# Patient Record
Sex: Female | Born: 1940 | Race: White | Hispanic: No | Smoking: Former smoker
Health system: Southern US, Community
[De-identification: ages and names within clinical notes are randomized; demographics above are authoritative.]

## PROBLEM LIST (undated history)

## (undated) DIAGNOSIS — I10 Essential (primary) hypertension: Secondary | ICD-10-CM

## (undated) DIAGNOSIS — C4490 Unspecified malignant neoplasm of skin, unspecified: Secondary | ICD-10-CM

## (undated) DIAGNOSIS — M1990 Unspecified osteoarthritis, unspecified site: Secondary | ICD-10-CM

## (undated) DIAGNOSIS — F32A Depression, unspecified: Secondary | ICD-10-CM

## (undated) DIAGNOSIS — F419 Anxiety disorder, unspecified: Secondary | ICD-10-CM

## (undated) DIAGNOSIS — Z923 Personal history of irradiation: Secondary | ICD-10-CM

## (undated) DIAGNOSIS — M4800 Spinal stenosis, site unspecified: Secondary | ICD-10-CM

## (undated) DIAGNOSIS — K219 Gastro-esophageal reflux disease without esophagitis: Secondary | ICD-10-CM

## (undated) DIAGNOSIS — M419 Scoliosis, unspecified: Secondary | ICD-10-CM

## (undated) DIAGNOSIS — E7800 Pure hypercholesterolemia, unspecified: Secondary | ICD-10-CM

## (undated) DIAGNOSIS — M810 Age-related osteoporosis without current pathological fracture: Secondary | ICD-10-CM

## (undated) DIAGNOSIS — D649 Anemia, unspecified: Secondary | ICD-10-CM

## (undated) DIAGNOSIS — N1831 Chronic kidney disease, stage 3a: Secondary | ICD-10-CM

## (undated) DIAGNOSIS — C801 Malignant (primary) neoplasm, unspecified: Secondary | ICD-10-CM

## (undated) DIAGNOSIS — K635 Polyp of colon: Secondary | ICD-10-CM

## (undated) DIAGNOSIS — E041 Nontoxic single thyroid nodule: Secondary | ICD-10-CM

## (undated) DIAGNOSIS — M19011 Primary osteoarthritis, right shoulder: Secondary | ICD-10-CM

## (undated) DIAGNOSIS — G8929 Other chronic pain: Secondary | ICD-10-CM

## (undated) DIAGNOSIS — E279 Disorder of adrenal gland, unspecified: Secondary | ICD-10-CM

## (undated) DIAGNOSIS — F329 Major depressive disorder, single episode, unspecified: Secondary | ICD-10-CM

## (undated) DIAGNOSIS — C50919 Malignant neoplasm of unspecified site of unspecified female breast: Secondary | ICD-10-CM

## (undated) DIAGNOSIS — H3530 Unspecified macular degeneration: Secondary | ICD-10-CM

## (undated) DIAGNOSIS — M1712 Unilateral primary osteoarthritis, left knee: Secondary | ICD-10-CM

## (undated) HISTORY — PX: ESOPHAGOGASTRODUODENOSCOPY: SHX1529

## (undated) HISTORY — PX: NOSE SURGERY: SHX723

## (undated) HISTORY — PX: APPENDECTOMY: SHX54

## (undated) HISTORY — PX: COLONOSCOPY W/ POLYPECTOMY: SHX1380

## (undated) HISTORY — PX: ABDOMINAL HYSTERECTOMY: SHX81

---

## 1997-08-28 ENCOUNTER — Ambulatory Visit (HOSPITAL_COMMUNITY): Admission: RE | Admit: 1997-08-28 | Discharge: 1997-08-28 | Payer: Self-pay | Admitting: Obstetrics & Gynecology

## 1997-12-08 ENCOUNTER — Ambulatory Visit (HOSPITAL_COMMUNITY): Admission: RE | Admit: 1997-12-08 | Discharge: 1997-12-08 | Payer: Self-pay

## 1999-10-18 ENCOUNTER — Ambulatory Visit (HOSPITAL_COMMUNITY): Admission: RE | Admit: 1999-10-18 | Discharge: 1999-10-18 | Payer: Self-pay | Admitting: Obstetrics & Gynecology

## 1999-10-18 ENCOUNTER — Encounter: Payer: Self-pay | Admitting: Obstetrics & Gynecology

## 2000-11-07 ENCOUNTER — Ambulatory Visit (HOSPITAL_COMMUNITY): Admission: RE | Admit: 2000-11-07 | Discharge: 2000-11-07 | Payer: Self-pay | Admitting: Family Medicine

## 2001-12-11 ENCOUNTER — Ambulatory Visit (HOSPITAL_COMMUNITY): Admission: RE | Admit: 2001-12-11 | Discharge: 2001-12-11 | Payer: Self-pay | Admitting: Internal Medicine

## 2001-12-11 ENCOUNTER — Encounter: Payer: Self-pay | Admitting: Family Medicine

## 2002-12-16 ENCOUNTER — Ambulatory Visit (HOSPITAL_COMMUNITY): Admission: RE | Admit: 2002-12-16 | Discharge: 2002-12-16 | Payer: Self-pay | Admitting: *Deleted

## 2004-01-14 ENCOUNTER — Ambulatory Visit: Payer: Self-pay | Admitting: Family Medicine

## 2004-07-14 ENCOUNTER — Ambulatory Visit: Payer: Self-pay | Admitting: Podiatry

## 2005-01-16 ENCOUNTER — Ambulatory Visit: Payer: Self-pay | Admitting: Family Medicine

## 2005-02-14 ENCOUNTER — Ambulatory Visit: Payer: Self-pay | Admitting: Family Medicine

## 2006-02-27 ENCOUNTER — Ambulatory Visit: Payer: Self-pay | Admitting: Family Medicine

## 2006-03-27 HISTORY — PX: REPLACEMENT TOTAL KNEE: SUR1224

## 2006-03-27 HISTORY — PX: JOINT REPLACEMENT: SHX530

## 2006-03-29 ENCOUNTER — Ambulatory Visit: Payer: Self-pay | Admitting: Family Medicine

## 2006-08-07 ENCOUNTER — Ambulatory Visit: Payer: Self-pay | Admitting: Gastroenterology

## 2007-03-04 ENCOUNTER — Ambulatory Visit: Payer: Self-pay | Admitting: Family Medicine

## 2007-03-11 ENCOUNTER — Ambulatory Visit: Payer: Self-pay | Admitting: Unknown Physician Specialty

## 2007-03-11 ENCOUNTER — Other Ambulatory Visit: Payer: Self-pay

## 2007-03-25 ENCOUNTER — Inpatient Hospital Stay: Payer: Self-pay | Admitting: Unknown Physician Specialty

## 2008-03-05 ENCOUNTER — Ambulatory Visit: Payer: Self-pay | Admitting: Family Medicine

## 2009-03-08 ENCOUNTER — Ambulatory Visit: Payer: Self-pay | Admitting: Family Medicine

## 2009-12-09 ENCOUNTER — Inpatient Hospital Stay: Payer: Self-pay | Admitting: Gastroenterology

## 2010-02-21 ENCOUNTER — Ambulatory Visit: Payer: Self-pay | Admitting: Unknown Physician Specialty

## 2010-04-26 ENCOUNTER — Ambulatory Visit: Payer: Self-pay | Admitting: Family Medicine

## 2011-04-28 ENCOUNTER — Ambulatory Visit: Payer: Self-pay | Admitting: Family Medicine

## 2011-06-17 ENCOUNTER — Ambulatory Visit: Payer: Self-pay | Admitting: Internal Medicine

## 2011-10-05 DIAGNOSIS — K219 Gastro-esophageal reflux disease without esophagitis: Secondary | ICD-10-CM | POA: Insufficient documentation

## 2011-10-05 DIAGNOSIS — I10 Essential (primary) hypertension: Secondary | ICD-10-CM | POA: Insufficient documentation

## 2011-10-05 DIAGNOSIS — F419 Anxiety disorder, unspecified: Secondary | ICD-10-CM | POA: Insufficient documentation

## 2011-10-05 DIAGNOSIS — T18108A Unspecified foreign body in esophagus causing other injury, initial encounter: Secondary | ICD-10-CM | POA: Insufficient documentation

## 2011-10-05 DIAGNOSIS — E7800 Pure hypercholesterolemia, unspecified: Secondary | ICD-10-CM | POA: Insufficient documentation

## 2011-10-05 DIAGNOSIS — K635 Polyp of colon: Secondary | ICD-10-CM | POA: Insufficient documentation

## 2011-11-28 ENCOUNTER — Ambulatory Visit: Payer: Self-pay | Admitting: Family Medicine

## 2012-05-01 ENCOUNTER — Ambulatory Visit: Payer: Self-pay | Admitting: Family Medicine

## 2013-02-24 ENCOUNTER — Ambulatory Visit: Payer: Self-pay | Admitting: Unknown Physician Specialty

## 2013-04-21 DIAGNOSIS — Z79891 Long term (current) use of opiate analgesic: Secondary | ICD-10-CM | POA: Insufficient documentation

## 2013-04-21 DIAGNOSIS — M1990 Unspecified osteoarthritis, unspecified site: Secondary | ICD-10-CM | POA: Insufficient documentation

## 2013-05-08 ENCOUNTER — Ambulatory Visit: Payer: Self-pay | Admitting: Family Medicine

## 2013-08-12 ENCOUNTER — Ambulatory Visit: Payer: Self-pay | Admitting: Emergency Medicine

## 2013-08-12 LAB — CBC WITH DIFFERENTIAL/PLATELET
Basophil #: 0.1 10*3/uL (ref 0.0–0.1)
Basophil %: 0.7 %
Eosinophil #: 0.1 10*3/uL (ref 0.0–0.7)
Eosinophil %: 0.6 %
HCT: 38.1 % (ref 35.0–47.0)
HGB: 12.8 g/dL (ref 12.0–16.0)
Lymphocyte #: 2.6 10*3/uL (ref 1.0–3.6)
Lymphocyte %: 28.3 %
MCH: 29.8 pg (ref 26.0–34.0)
MCHC: 33.6 g/dL (ref 32.0–36.0)
MCV: 89 fL (ref 80–100)
Monocyte #: 0.8 x10 3/mm (ref 0.2–0.9)
Monocyte %: 8.5 %
Neutrophil #: 5.8 10*3/uL (ref 1.4–6.5)
Neutrophil %: 61.9 %
Platelet: 263 10*3/uL (ref 150–440)
RBC: 4.3 10*6/uL (ref 3.80–5.20)
RDW: 14.1 % (ref 11.5–14.5)
WBC: 9.3 10*3/uL (ref 3.6–11.0)

## 2013-08-12 LAB — URINALYSIS, COMPLETE
Bilirubin,UR: NEGATIVE
Blood: NEGATIVE
Glucose,UR: NEGATIVE mg/dL (ref 0–75)
Ketone: NEGATIVE
Nitrite: NEGATIVE
Ph: 7 (ref 4.5–8.0)
Protein: NEGATIVE
Specific Gravity: 1.01 (ref 1.003–1.030)
WBC UR: >30 /HPF (ref 0–5)

## 2013-08-12 LAB — BASIC METABOLIC PANEL
Anion Gap: 11 (ref 7–16)
BUN: 25 mg/dL — ABNORMAL HIGH (ref 7–18)
Calcium, Total: 9.9 mg/dL (ref 8.5–10.1)
Chloride: 93 mmol/L — ABNORMAL LOW (ref 98–107)
Co2: 31 mmol/L (ref 21–32)
Creatinine: 1.27 mg/dL (ref 0.60–1.30)
EGFR (African American): 48 — ABNORMAL LOW
EGFR (Non-African Amer.): 42 — ABNORMAL LOW
Glucose: 112 mg/dL — ABNORMAL HIGH (ref 65–99)
Osmolality: 275 (ref 275–301)
Potassium: 3.4 mmol/L — ABNORMAL LOW (ref 3.5–5.1)
Sodium: 135 mmol/L — ABNORMAL LOW (ref 136–145)

## 2013-08-14 LAB — URINE CULTURE

## 2013-08-22 ENCOUNTER — Inpatient Hospital Stay: Payer: Self-pay | Admitting: Internal Medicine

## 2013-08-22 LAB — URINALYSIS, COMPLETE
Bacteria: NONE SEEN
Bilirubin,UR: NEGATIVE
Blood: NEGATIVE
Glucose,UR: NEGATIVE mg/dL (ref 0–75)
Ketone: NEGATIVE
Leukocyte Esterase: NEGATIVE
Nitrite: NEGATIVE
Ph: 8 (ref 4.5–8.0)
Protein: NEGATIVE
RBC,UR: NONE SEEN /HPF (ref 0–5)
Specific Gravity: 1.003 (ref 1.003–1.030)
Squamous Epithelial: NONE SEEN
WBC UR: <1 /HPF (ref 0–5)

## 2013-08-22 LAB — COMPREHENSIVE METABOLIC PANEL
Albumin: 4.1 g/dL (ref 3.4–5.0)
Alkaline Phosphatase: 67 U/L
Anion Gap: 6 — ABNORMAL LOW (ref 7–16)
BUN: 9 mg/dL (ref 7–18)
Bilirubin,Total: 0.4 mg/dL (ref 0.2–1.0)
Calcium, Total: 9.3 mg/dL (ref 8.5–10.1)
Chloride: 88 mmol/L — ABNORMAL LOW (ref 98–107)
Co2: 28 mmol/L (ref 21–32)
Creatinine: 0.96 mg/dL (ref 0.60–1.30)
EGFR (African American): >60
EGFR (Non-African Amer.): 58 — ABNORMAL LOW
Glucose: 140 mg/dL — ABNORMAL HIGH (ref 65–99)
Osmolality: 247 (ref 275–301)
Potassium: 3.5 mmol/L (ref 3.5–5.1)
SGOT(AST): 34 U/L (ref 15–37)
SGPT (ALT): 26 U/L (ref 12–78)
Sodium: 122 mmol/L — ABNORMAL LOW (ref 136–145)
Total Protein: 7.4 g/dL (ref 6.4–8.2)

## 2013-08-22 LAB — CBC WITH DIFFERENTIAL/PLATELET
Basophil #: 0.1 10*3/uL (ref 0.0–0.1)
Basophil %: 0.9 %
Eosinophil #: 0 10*3/uL (ref 0.0–0.7)
Eosinophil %: 0.7 %
HCT: 35 % (ref 35.0–47.0)
HGB: 12.1 g/dL (ref 12.0–16.0)
Lymphocyte #: 1.9 10*3/uL (ref 1.0–3.6)
Lymphocyte %: 27.1 %
MCH: 30.2 pg (ref 26.0–34.0)
MCHC: 34.7 g/dL (ref 32.0–36.0)
MCV: 87 fL (ref 80–100)
Monocyte #: 0.6 x10 3/mm (ref 0.2–0.9)
Monocyte %: 8.8 %
Neutrophil #: 4.4 10*3/uL (ref 1.4–6.5)
Neutrophil %: 62.5 %
Platelet: 220 10*3/uL (ref 150–440)
RBC: 4.02 10*6/uL (ref 3.80–5.20)
RDW: 14 % (ref 11.5–14.5)
WBC: 7.1 10*3/uL (ref 3.6–11.0)

## 2013-08-22 LAB — URIC ACID: Uric Acid: 4.2 mg/dL (ref 2.6–6.0)

## 2013-08-22 LAB — SODIUM, URINE, RANDOM: Sodium, Urine Random: 38 mmol/L (ref 20–110)

## 2013-08-22 LAB — SODIUM: SODIUM: 128 mmol/L — AB (ref 136–145)

## 2013-08-22 LAB — OSMOLALITY, URINE: Osmolality: 146 mOsm/kg

## 2013-08-23 LAB — BASIC METABOLIC PANEL
ANION GAP: 4 — AB (ref 7–16)
BUN: 17 mg/dL (ref 7–18)
CALCIUM: 8.6 mg/dL (ref 8.5–10.1)
CHLORIDE: 99 mmol/L (ref 98–107)
CO2: 30 mmol/L (ref 21–32)
CREATININE: 0.95 mg/dL (ref 0.60–1.30)
EGFR (African American): >60
GFR CALC NON AF AMER: 59 — AB
GLUCOSE: 90 mg/dL (ref 65–99)
Osmolality: 267 (ref 275–301)
POTASSIUM: 3.8 mmol/L (ref 3.5–5.1)
Sodium: 133 mmol/L — ABNORMAL LOW (ref 136–145)

## 2013-08-23 LAB — CBC WITH DIFFERENTIAL/PLATELET
BASOS PCT: 0.9 %
Basophil #: 0.1 10*3/uL (ref 0.0–0.1)
EOS ABS: 0.1 10*3/uL (ref 0.0–0.7)
EOS PCT: 2.1 %
HCT: 32.6 % — AB (ref 35.0–47.0)
HGB: 11 g/dL — ABNORMAL LOW (ref 12.0–16.0)
LYMPHS ABS: 2.8 10*3/uL (ref 1.0–3.6)
Lymphocyte %: 39.2 %
MCH: 30 pg (ref 26.0–34.0)
MCHC: 33.9 g/dL (ref 32.0–36.0)
MCV: 89 fL (ref 80–100)
MONO ABS: 0.9 x10 3/mm (ref 0.2–0.9)
Monocyte %: 12 %
NEUTROS ABS: 3.3 10*3/uL (ref 1.4–6.5)
Neutrophil %: 45.8 %
Platelet: 212 10*3/uL (ref 150–440)
RBC: 3.68 10*6/uL — ABNORMAL LOW (ref 3.80–5.20)
RDW: 14.3 % (ref 11.5–14.5)
WBC: 7.3 10*3/uL (ref 3.6–11.0)

## 2013-08-23 LAB — TSH: THYROID STIMULATING HORM: 1.54 u[IU]/mL

## 2013-08-24 LAB — URINE CULTURE

## 2014-03-26 DIAGNOSIS — M1710 Unilateral primary osteoarthritis, unspecified knee: Secondary | ICD-10-CM | POA: Insufficient documentation

## 2014-03-26 DIAGNOSIS — M179 Osteoarthritis of knee, unspecified: Secondary | ICD-10-CM | POA: Insufficient documentation

## 2014-05-19 ENCOUNTER — Ambulatory Visit: Payer: Self-pay | Admitting: Family Medicine

## 2014-05-26 DIAGNOSIS — S76319A Strain of muscle, fascia and tendon of the posterior muscle group at thigh level, unspecified thigh, initial encounter: Secondary | ICD-10-CM | POA: Insufficient documentation

## 2014-05-27 DIAGNOSIS — M6530 Trigger finger, unspecified finger: Secondary | ICD-10-CM | POA: Insufficient documentation

## 2014-07-18 NOTE — H&P (Signed)
PATIENT NAME:  Allison Hall, Allison Hall MR#:  [id number] DATE OF BIRTH:  11/16/1940  DATE OF ADMISSION:  08/22/2013  REFERRING PHYSICIAN: Dr. Karma Greaser  PRIMARY CARE PHYSICIAN:   CHIEF COMPLAINT: Abnormal labs.   HISTORY OF PRESENT ILLNESS: This is a very nice 74 year old female with history of recently diagnosed urinary tract infection on Aug 12, 2013, treated with Bactrim. The patient comes today with a history of going to her doctor and her doctor being alarmed due to low sodium levels. The patient has not had any significant changes on her condition other than the recent urinary tract infection on May 19th. At that moment, the patient was given trimethoprim-sulfamethoxazole. Her temperature was 99.4 and she was feeling pretty much tired. She looked dehydrated for what she was told to drink more water. The patient has doubled or even tripled her water intake and occasionally she drinks also Pedialyte, but she is definitely drinking much more water than usual. She denies any shortness of breath, any chest pain, any dysuria whatsoever. The patient is admitted for evaluation of her hyponatremia.  REVIEW OF SYSTEMS:  A 12 system review is done.  CONSTITUTIONAL: No fever. Positive chronic fatigue for 2 to 3 weeks. No weakness. Positive weight loss, but it has been intentionally over a year. She has lost at least 20 pounds. No weight gain.  EYES: No blurry vision, double vision. She is status post eyelid surgery.  EARS, NOSE, THROAT: No difficulty swallowing or tinnitus.  RESPIRATORY: No shortness of breath, cough or wheezing.  CARDIOVASCULAR: No chest pain or orthopnea.  GASTROINTESTINAL: No nausea, vomiting, abdominal pain, constipation, diarrhea.  GENITOURINARY: No dysuria, hematuria. Status post urinary tract infection, recently treated.  ENDOCRINE: No polyuria, polydipsia, polyphagia, cold or heat intolerance.  HEMATOLOGIC AND LYMPHATIC: No anemia, easy bruising or bleeding.  SKIN: No rashes or  petechiae.  MUSCULOSKELETAL: No significant neck pain, back pain or gout.  NEUROLOGIC: No numbness, tingling or CVA.  PSYCHIATRIC: No insomnia or depression.   PAST MEDICAL HISTORY: 1.  GERD.  2.  Depression.  3.  Hypertension.  4.  Osteoarthritis with chronic knee pain status post steroid injection.  5.  Hyperlipidemia.  PAST SURGICAL HISTORY: 1.  Bilateral eyelid surgery.  2.  Appendectomy.  3.  Right knee replacement in 2008.  4.  Left foot spur removed.  5.  Rhinoplasty.  6.  Hysterectomy.   ALLERGIES: No known drug allergies.   SOCIAL HISTORY: The patient used to smoke. She quit 40 years ago. She lives at home with her husband who has significant care issues due to spinal surgery that went wrong, as per the patient, and now she is the primary caregiver. She is retired. Does not smoke at this moment, does not drink.   FAMILY HISTORY: Positive for MI in her dad.  CURRENT MEDICATIONS: Meloxicam 7.5 mg once a day, tramadol 50 mg every 4 hours as needed for pain, lisinopril 10 mg once daily, alprazolam 0.25 mg as needed for depression, amlodipine 2.5 mg once a day, trimethoprim-sulfamethoxazole already completed, omeprazole 40 mg daily, fish oil 1200 mg daily, amlodipine 2.5 mg daily.   PHYSICAL EXAMINATION: VITAL SIGNS: Blood pressure 142/72, pulse 85, respirations 18, temperature 97.6.  GENERAL: The patient is alert and oriented x3, in no acute distress. No respiratory distress. Hemodynamically stable.  HEENT: Pupils are equal and reactive. Extraocular movements are intact. Mucosa is moist. Anicteric sclerae. Pink conjunctivae. No oral lesions. No oropharyngeal exudates.  NECK: Supple. No JVD. No thyromegaly. No adenopathy. No  carotid bruits.  CARDIOVASCULAR: Regular rate and rhythm. No murmurs, rubs, or gallops. No displacement of PMI. No tenderness to palpation on anterior chest wall.  LUNGS: Clear without any wheezing or crepitus. No use of accessory muscles.  ABDOMEN: Soft,  nontender, nondistended. No hepatosplenomegaly. No masses. Bowel sounds are positive.  EXTREMITIES: No edema, cyanosis or clubbing. Pulses +2. Capillary refill less than 3.  NEUROLOGIC: Cranial nerves II through XII intact. No focal findings. Strength is equal in all 4 extremities. PSYCHIATRIC: No agitation. The patient is alert and oriented x3.  SKIN: No rashes or petechiae. Normal turgor. Her eyes have some swelling at the level of the eyelids, which is secondary to surgery and has not been abnormal recently.   DIAGNOSTIC DATA: Her urine has 30 white blood cells, leukocyte esterase +2, trace bacteria; that was on May 19th. At that moment, she had mixed bacteria and her culture was not positive. White count 7.1, hemoglobin 12, platelet count 220,000. LFTs within normal limits. Her sodium is 122, potassium 3.5, chloride 88, glucose 140, GFR is around 60, and creatinine 0.96.  ASSESSMENT AND PLAN: This is a 74 year old female with history of hypertension, gastroesophageal reflux disease, and depression admitted for hyponatremia.  1.  Hyponatremia. The patient is mildly symptomatic with fatigue mostly, but no other symptoms. She is not orthostatic at this moment. It does not look like she is volume depleted. She has been drinking actually a lot of free water intake for the most. The patient is admitted for treatment of hyponatremia. We are going to give her IV fluids, just NS at this moment, but I think that overall she is going to need some fluid restriction off regular water. The patient is going to have urine sodium, urine osmolality, and a uric acid to evaluate the possibility of syndrome of inappropriate antidiuretic hormone. On top of that, since she is a previous smoker, we are going to do a chest x-ray, again working on the possibility of syndrome of inappropriate antidiuretic hormone as this could be related to lung cancer. The patient is doing okay. She is stable. She is going to have sodium levels  monitored tonight and then tomorrow. Overall, the patient is doing fine.  2.  Hypertension. Continue amlodipine and lisinopril.  3.  Depression. Seems to be stable at this moment.  4.  Deep vein thrombosis prophylaxis. The patient is ambulatory. We are going to do compression mechanical devices.  5.  Gastrointestinal prophylaxis with omeprazole.   TIME SPENT: About 40 minutes. ____________________________ Ranger Sink, MD rsg:sb D: 08/22/2013 15:31:14 ET T: 08/22/2013 16:15:21 ET JOB#: [id number]  cc: Shageluk Sink, MD, <Dictator> Lashe Oliveira America Brown MD ELECTRONICALLY SIGNED 08/30/2013 13:48

## 2014-07-18 NOTE — Discharge Summary (Signed)
PATIENT NAME:  Allison Hall, Allison Hall MR#:  [id number] DATE OF BIRTH:  12/19/1940  DATE OF ADMISSION:  08/22/2013 DATE OF DISCHARGE:  08/23/2013  ADMISSION DIAGNOSIS: Hyponatremia.   DISCHARGE DIAGNOSES: 1. Hyponatremia secondary to polydipsia.  2. History of hypertension.  3. History of depression and anxiety.  4. Gastroesophageal reflux disease.  LABORATORIES AT DISCHARGE: hgb 11, hematocrit 33, platelets are 212,000. Sodium 133, potassium 3.8, chloride 99, bicarbonate 30, BUN 17, creatinine 0.95. Glucose is 90.   HOSPITAL COURSE: A very pleasant 74 year old female who presented from her PCP's office with hyponatremia. For further details, please refer to H and P.  1. Hyponatremia secondary to polydipsia. The patient was not feeling well was told to increase her fluid intake. She took too much fluid and therefore had hyponatremia and she was on fluid restriction. Her sodium has improved to 133.  2. Hypertension. The patient will continue outpatient medications including Norvasc.  3. GERD. The patient is on PPI. 4. Depression/anxiety. The patient will continue her Xanax.    DISCHARGE MEDICATIONS: 1. Omeprazole 40 mg daily.  2. Norvasc 2.5 mg daily.  3. Fish oil 1200 mg daily.  4. Vitamin D3 at 1000 international units daily. 5. Xanax 0.5 mg one-half tablet at bedtime.   DISCHARGE DIET: Regular diet.   DISCHARGE ACTIVITY: As tolerated.   DISCHARGE FOLLOWUP: The patient can follow up with her primary care physician in one week.    TIME SPENT: 35 minutes. The patient is medically stable for discharge   ____________________________ Kesley Gaffey P. Benjie Karvonen, MD spm:lm D: 08/23/2013 12:03:30 ET T: 08/23/2013 21:27:55 ET JOB#: [id number]  cc: Imari Sivertsen P. Benjie Karvonen, MD, <Dictator> Rutherford Nail, MD Donell Beers Anabelen Kaminsky MD ELECTRONICALLY SIGNED 08/24/2013 11:54

## 2014-11-29 ENCOUNTER — Emergency Department
Admission: EM | Admit: 2014-11-29 | Discharge: 2014-11-29 | Disposition: A | Discharge status: Home / Self Care | Payer: PPO | Attending: Emergency Medicine | Admitting: Emergency Medicine

## 2014-11-29 ENCOUNTER — Emergency Department: Payer: PPO

## 2014-11-29 ENCOUNTER — Encounter: Payer: Self-pay | Admitting: Emergency Medicine

## 2014-11-29 DIAGNOSIS — Z87891 Personal history of nicotine dependence: Secondary | ICD-10-CM | POA: Diagnosis not present

## 2014-11-29 DIAGNOSIS — R42 Dizziness and giddiness: Secondary | ICD-10-CM | POA: Diagnosis not present

## 2014-11-29 DIAGNOSIS — I10 Essential (primary) hypertension: Secondary | ICD-10-CM | POA: Diagnosis not present

## 2014-11-29 HISTORY — DX: Gastro-esophageal reflux disease without esophagitis: K21.9

## 2014-11-29 HISTORY — DX: Pure hypercholesterolemia, unspecified: E78.00

## 2014-11-29 HISTORY — DX: Anxiety disorder, unspecified: F41.9

## 2014-11-29 HISTORY — DX: Polyp of colon: K63.5

## 2014-11-29 HISTORY — DX: Unspecified osteoarthritis, unspecified site: M19.90

## 2014-11-29 HISTORY — DX: Essential (primary) hypertension: I10

## 2014-11-29 LAB — BASIC METABOLIC PANEL
Anion gap: 7 (ref 5–15)
BUN: 20 mg/dL (ref 6–20)
CHLORIDE: 105 mmol/L (ref 101–111)
CO2: 27 mmol/L (ref 22–32)
CREATININE: 0.95 mg/dL (ref 0.44–1.00)
Calcium: 9.6 mg/dL (ref 8.9–10.3)
GFR calc non Af Amer: 57 mL/min — ABNORMAL LOW (ref >60)
Glucose, Bld: 124 mg/dL — ABNORMAL HIGH (ref 65–99)
POTASSIUM: 3.5 mmol/L (ref 3.5–5.1)
SODIUM: 139 mmol/L (ref 135–145)

## 2014-11-29 LAB — CBC
HCT: 38.8 % (ref 35.0–47.0)
Hemoglobin: 12.9 g/dL (ref 12.0–16.0)
MCH: 29.6 pg (ref 26.0–34.0)
MCHC: 33.4 g/dL (ref 32.0–36.0)
MCV: 88.7 fL (ref 80.0–100.0)
PLATELETS: 213 10*3/uL (ref 150–440)
RBC: 4.37 MIL/uL (ref 3.80–5.20)
RDW: 15.4 % — ABNORMAL HIGH (ref 11.5–14.5)
WBC: 5.3 10*3/uL (ref 3.6–11.0)

## 2014-11-29 MED ORDER — MECLIZINE HCL 25 MG PO TABS: 25.0000 mg | ORAL_TABLET | Freq: Three times a day (TID) | ORAL | Status: DC | PRN

## 2014-11-29 MED ORDER — LORAZEPAM 1 MG PO TABS: 1.0000 mg | ORAL_TABLET | Freq: Three times a day (TID) | ORAL | Status: DC | PRN

## 2014-11-29 MED ORDER — LORAZEPAM 2 MG/ML IJ SOLN
0.5000 mg | Freq: Once | INTRAMUSCULAR | Status: AC
  Administered 2014-11-29: 0.5 mg via INTRAVENOUS
  Filled 2014-11-29: qty 1

## 2014-11-29 MED ORDER — MECLIZINE HCL 25 MG PO TABS
25.0000 mg | ORAL_TABLET | Freq: Once | ORAL | Status: AC
  Administered 2014-11-29: 25 mg via ORAL
  Filled 2014-11-29: qty 1

## 2014-11-29 NOTE — ED Notes (Signed)
Pt coming from home via [facility] states yesterday she was dizzy and took meclozine states it worked yesterday but woke up this AM and felt the same but the medication didn't help this time. Pt states she had this happen to her before and was told her sodium was low fears it may be the same thing this time. Pt also complains of a headache.

## 2014-11-29 NOTE — ED Provider Notes (Signed)
Time Seen: Approximately 1205  I have reviewed the triage notes  Chief Complaint: Dizziness   History of Present Illness: Allison Hall is a 73 y.o. female who presents with the feeling of dizziness. Patient states her symptoms started yesterday when she woke up in the morning. She had trouble walking to the bathroom and felt out of balance. She did not have any focal weakness or trouble with speech or swallowing. She states she took some of her friends Antivert which offered some improvement and she actually felt better yesterday afternoon and evening. Patient states she woke up again this morning with feeling of being off balance in her room rotating. She states she may have felt lightheaded but denies any syncope. She denies any chest pain or shortness of breath. She did have some nausea but no persistent vomiting. She denies any facial pain or deafness. She states she did have trouble reading at home. She denies any eye pain or blind spots in her vision. Patient states her symptoms resolve whenever she lies flat or hold still and is only existent with movement.   Past Medical History  Diagnosis Date  . Hypertension   . Hypercholesteremia   . Anxiety   . GERD (gastroesophageal reflux disease)   . Osteoarthritis   . Colon polyp     There are no active problems to display for this patient.   Past Surgical History  Procedure Laterality Date  . Abdominal hysterectomy    . Appendectomy    . Nose surgery    . Replacement total knee      Past Surgical History  Procedure Laterality Date  . Abdominal hysterectomy    . Appendectomy    . Nose surgery    . Replacement total knee      Current Outpatient Rx  Name  Route  Sig  Dispense  Refill  . LORazepam (ATIVAN) 1 MG tablet   Oral   Take 1 tablet (1 mg total) by mouth every 8 (eight) hours as needed for anxiety.   30 tablet   0   . meclizine (ANTIVERT) 25 MG tablet   Oral   Take 1 tablet (25 mg total) by mouth  3 (three) times daily as needed for dizziness.   30 tablet   0     Allergies:  Statins and Sulfa antibiotics  Family History: Family History  Problem Relation Age of Onset  . Hypertension Mother   . Heart failure Father     Social History: Social History  Substance Use Topics  . Smoking status: Former Research scientist (life sciences)  . Smokeless tobacco: None  . Alcohol Use: 2.4 oz/week    4 Glasses of wine per week     Review of Systems:   10 point review of systems was performed and was otherwise negative:  Constitutional: No fever Eyes: No visual disturbances ENT: No sore throat, ear pain Cardiac: No chest pain Respiratory: No shortness of breath, wheezing, or stridor Abdomen: No abdominal pain, no vomiting, No diarrhea Endocrine: No weight loss, No night sweats Extremities: No peripheral edema, cyanosis Skin: No rashes, easy bruising Neurologic: No focal weakness, trouble with speech or swollowing Urologic: No dysuria, Hematuria, or urinary frequency   Physical Exam:  ED Triage Vitals  Enc Vitals Group     BP 11/29/14 1114 164/95 mmHg     Pulse Rate 11/29/14 1114 75     Resp 11/29/14 1114 18     Temp 11/29/14 1114 97.3 F (36.3 C)  Temp Source 11/29/14 1114 Oral     SpO2 11/29/14 1114 99 %     Weight 11/29/14 1114 200 lb (90.719 kg)     Height 11/29/14 1114 5\' 5"  (1.651 m)     Head Cir --      Peak Flow --      Pain Score --      Pain Loc --      Pain Edu? --      Excl. in [place]? --     General: Awake , Alert , and Oriented times 3; GCS 15 Head: Normal cephalic , atraumatic Eyes: Pupils equal , round, reactive to light. Mild left lateral 3 beat nystagmus which appears to be exhaustive Nose/Throat: No nasal drainage, patent upper airway without erythema or exudate.  Neck: No bruits .Supple, Full range of motion, No anterior adenopathy or palpable thyroid masses Lungs: Clear to ascultation without wheezes , rhonchi, or rales Heart: Regular rate, regular rhythm without  murmurs , gallops , or rubs Abdomen: Soft, non tender without rebound, guarding , or rigidity; bowel sounds positive and symmetric in all 4 quadrants. No organomegaly .        Extremities: 2 plus symmetric pulses. No edema, clubbing or cyanosis Neurologic: Negative cerebellar signs , Motor symmetric without deficits, sensory intact Skin: warm, dry, no rashes   Labs:   All laboratory work was reviewed including any pertinent negatives or positives listed below:  [place] - Abnormal; Notable for the following:    Glucose, Bld 124 (*)    GFR calc non Af Amer 57 (*)    All other components within normal limits  CBC - Abnormal; Notable for the following:    RDW 15.4 (*)    All other components within normal limits    EKG:  ED ECG REPORT I, Daymon Larsen, the attending physician, personally viewed and interpreted this ECG.  Date: 11/29/2014 EKG Time: 1158 Rate: 74 Rhythm: normal sinus rhythm QRS Axis: normal Intervals: normal ST/T Wave abnormalities: normal Conduction Disutrbances: none Narrative Interpretation:  Left ventricular hypertrophy Left atrial enlargement     Radiology: CT HEAD WITHOUT CONTRAST  TECHNIQUE: Contiguous axial images were obtained from the base of the skull through the vertex without intravenous contrast.  COMPARISON: None.  FINDINGS: There is no evidence of mass effect, midline shift, or extra-axial fluid collections. There is no evidence of a space-occupying lesion or intracranial hemorrhage. There is no evidence of a cortical-based area of acute infarction. There is periventricular white matter low attenuation likely secondary to microangiopathy.  The ventricles and sulci are appropriate for the patient's age. The basal cisterns are patent.  Visualized portions of the orbits are unremarkable. The visualized portions of the paranasal sinuses and mastoid air cells are unremarkable.  The osseous structures  are unremarkable.  IMPRESSION: 1. No acute intracranial pathology. 2. Chronic microvascular disease.   I personally reviewed the radiologic studies   Procedures: Patient had Epley maneuvers with some resolution of her symptoms      ED Course:  Differential for peripheral vertigo includes Mnire's disease, vestibulitis, benign positional vertigo.  Patient's stay here showed some gradual symptomatic improvement and she was given some IV low-dose Ativan along with oral meclizine here in emergency department. Her differential for dizziness is near syncope versus vertiginous causes. I felt given her description this most likely was vertigo and with resolution of her symptoms while lying flat and felt this was benign vertigo. Patient had a head  CT, also because of headache and there is no signs of ischemic changes on her head CT. She was cautioned that if her symptoms persist she may need an outpatient MRI. Patient does not exhibit any focal weakness or any other signs of ischemia and I felt this was unlikely to be central vertigo with is exhaustive nature of her symptoms. Patient also states that she feels some improvement with meclizine and Ativan   Assessment:  Peripheral vertigo  Final Clinical Impression:  Final diagnoses:  Vertigo     Plan:Patient was advised to return immediately if condition worsens. Patient was advised to follow up with her primary care physician or other specialized physicians involved and in their current assessment. Patient was prescribed oral Ativan along with Antivert. She apparently is currently in a stressful environment with an ill husband at home. Patient was advised to contact her primary physician for further outpatient follow-up and treatment.            Daymon Larsen, MD 11/29/14 [phone]

## 2014-11-29 NOTE — Discharge Instructions (Signed)
Vertigo Vertigo means you feel like you or your surroundings are moving when they are not. Vertigo can be dangerous if it occurs when you are at work, driving, or performing difficult activities.  CAUSES  Vertigo occurs when there is a conflict of signals sent to your brain from the visual and sensory systems in your body. There are many different causes of vertigo, including:  Infections, especially in the inner ear.  A bad reaction to a drug or misuse of alcohol and medicines.  Withdrawal from drugs or alcohol.  Rapidly changing positions, such as lying down or rolling over in bed.  A migraine headache.  Decreased blood flow to the brain.  Increased pressure in the brain from a head injury, infection, tumor, or bleeding. SYMPTOMS  You may feel as though the world is spinning around or you are falling to the ground. Because your balance is upset, vertigo can cause nausea and vomiting. You may have involuntary eye movements (nystagmus). DIAGNOSIS  Vertigo is usually diagnosed by physical exam. If the cause of your vertigo is unknown, your caregiver may perform imaging tests, such as an MRI scan (magnetic resonance imaging). TREATMENT  Most cases of vertigo resolve on their own, without treatment. Depending on the cause, your caregiver may prescribe certain medicines. If your vertigo is related to body position issues, your caregiver may recommend movements or procedures to correct the problem. In rare cases, if your vertigo is caused by certain inner ear problems, you may need surgery. HOME CARE INSTRUCTIONS   Follow your caregiver's instructions.  Avoid driving.  Avoid operating heavy machinery.  Avoid performing any tasks that would be dangerous to you or others during a vertigo episode.  Tell your caregiver if you notice that certain medicines seem to be causing your vertigo. Some of the medicines used to treat vertigo episodes can actually make them worse in some people. SEEK  IMMEDIATE MEDICAL CARE IF:   Your medicines do not relieve your vertigo or are making it worse.  You develop problems with talking, walking, weakness, or using your arms, hands, or legs.  You develop severe headaches.  Your nausea or vomiting continues or gets worse.  You develop visual changes.  A family member notices behavioral changes.  Your condition gets worse. MAKE SURE YOU:  Understand these instructions.  Will watch your condition.  Will get help right away if you are not doing well or get worse. Document Released: 12/21/2004 Document Revised: 06/05/2011 Document Reviewed: 09/29/2010 [facility] Patient Information 2015 [place], [place]. This information is not intended to replace advice given to you by your health care provider. Make sure you discuss any questions you have with your health care provider.   Please return immediately if condition worsens. Please contact her primary physician or the physician you were given for referral. If you have any specialist physicians involved in her treatment and plan please also contact them. Thank you for using [facility] emergency Department.

## 2014-12-01 ENCOUNTER — Other Ambulatory Visit: Payer: Self-pay | Admitting: Family Medicine

## 2014-12-01 DIAGNOSIS — R42 Dizziness and giddiness: Secondary | ICD-10-CM

## 2014-12-01 DIAGNOSIS — R270 Ataxia, unspecified: Secondary | ICD-10-CM

## 2014-12-02 ENCOUNTER — Ambulatory Visit
Admission: RE | Admit: 2014-12-02 | Discharge: 2014-12-02 | Disposition: A | Discharge status: Home / Self Care | Payer: PPO | Source: Ambulatory Visit | Attending: Family Medicine | Admitting: Family Medicine

## 2014-12-02 DIAGNOSIS — R270 Ataxia, unspecified: Secondary | ICD-10-CM | POA: Diagnosis present

## 2014-12-02 DIAGNOSIS — R42 Dizziness and giddiness: Secondary | ICD-10-CM | POA: Insufficient documentation

## 2014-12-02 DIAGNOSIS — I679 Cerebrovascular disease, unspecified: Secondary | ICD-10-CM | POA: Diagnosis not present

## 2014-12-02 MED ORDER — GADOBENATE DIMEGLUMINE 529 MG/ML IV SOLN
20.0000 mL | Freq: Once | INTRAVENOUS | Status: AC | PRN
  Administered 2014-12-02: 19 mL via INTRAVENOUS

## 2015-04-29 DIAGNOSIS — R05 Cough: Secondary | ICD-10-CM | POA: Diagnosis not present

## 2015-04-29 DIAGNOSIS — L989 Disorder of the skin and subcutaneous tissue, unspecified: Secondary | ICD-10-CM | POA: Diagnosis not present

## 2015-04-29 DIAGNOSIS — Z1231 Encounter for screening mammogram for malignant neoplasm of breast: Secondary | ICD-10-CM | POA: Diagnosis not present

## 2015-04-29 DIAGNOSIS — I10 Essential (primary) hypertension: Secondary | ICD-10-CM | POA: Diagnosis not present

## 2015-04-29 DIAGNOSIS — G4700 Insomnia, unspecified: Secondary | ICD-10-CM | POA: Diagnosis not present

## 2015-04-29 DIAGNOSIS — E785 Hyperlipidemia, unspecified: Secondary | ICD-10-CM | POA: Diagnosis not present

## 2015-04-29 DIAGNOSIS — K219 Gastro-esophageal reflux disease without esophagitis: Secondary | ICD-10-CM | POA: Diagnosis not present

## 2015-04-29 DIAGNOSIS — N189 Chronic kidney disease, unspecified: Secondary | ICD-10-CM | POA: Diagnosis not present

## 2015-04-29 DIAGNOSIS — E559 Vitamin D deficiency, unspecified: Secondary | ICD-10-CM | POA: Diagnosis not present

## 2015-04-29 DIAGNOSIS — F419 Anxiety disorder, unspecified: Secondary | ICD-10-CM | POA: Diagnosis not present

## 2015-05-14 DIAGNOSIS — E785 Hyperlipidemia, unspecified: Secondary | ICD-10-CM | POA: Diagnosis not present

## 2015-05-14 DIAGNOSIS — H2513 Age-related nuclear cataract, bilateral: Secondary | ICD-10-CM | POA: Diagnosis not present

## 2015-05-14 DIAGNOSIS — E559 Vitamin D deficiency, unspecified: Secondary | ICD-10-CM | POA: Diagnosis not present

## 2015-05-19 ENCOUNTER — Emergency Department
Admission: EM | Admit: 2015-05-19 | Discharge: 2015-05-20 | Disposition: A | Discharge status: Home / Self Care | Payer: PPO | Attending: Emergency Medicine | Admitting: Emergency Medicine

## 2015-05-19 ENCOUNTER — Emergency Department: Payer: PPO

## 2015-05-19 ENCOUNTER — Ambulatory Visit (INDEPENDENT_AMBULATORY_CARE_PROVIDER_SITE_OTHER): Payer: PPO | Admitting: Psychiatry

## 2015-05-19 ENCOUNTER — Encounter: Payer: Self-pay | Admitting: *Deleted

## 2015-05-19 ENCOUNTER — Encounter: Payer: Self-pay | Admitting: Psychiatry

## 2015-05-19 DIAGNOSIS — Y9301 Activity, walking, marching and hiking: Secondary | ICD-10-CM | POA: Diagnosis not present

## 2015-05-19 DIAGNOSIS — Z791 Long term (current) use of non-steroidal anti-inflammatories (NSAID): Secondary | ICD-10-CM | POA: Insufficient documentation

## 2015-05-19 DIAGNOSIS — S52691A Other fracture of lower end of right ulna, initial encounter for closed fracture: Secondary | ICD-10-CM | POA: Insufficient documentation

## 2015-05-19 DIAGNOSIS — S80211A Abrasion, right knee, initial encounter: Secondary | ICD-10-CM | POA: Insufficient documentation

## 2015-05-19 DIAGNOSIS — Y9222 Religious institution as the place of occurrence of the external cause: Secondary | ICD-10-CM | POA: Insufficient documentation

## 2015-05-19 DIAGNOSIS — S50811A Abrasion of right forearm, initial encounter: Secondary | ICD-10-CM | POA: Diagnosis not present

## 2015-05-19 DIAGNOSIS — F411 Generalized anxiety disorder: Secondary | ICD-10-CM

## 2015-05-19 DIAGNOSIS — Y998 Other external cause status: Secondary | ICD-10-CM | POA: Diagnosis not present

## 2015-05-19 DIAGNOSIS — W108XXA Fall (on) (from) other stairs and steps, initial encounter: Secondary | ICD-10-CM | POA: Diagnosis not present

## 2015-05-19 DIAGNOSIS — S6991XA Unspecified injury of right wrist, hand and finger(s), initial encounter: Secondary | ICD-10-CM | POA: Diagnosis not present

## 2015-05-19 DIAGNOSIS — Z87891 Personal history of nicotine dependence: Secondary | ICD-10-CM | POA: Insufficient documentation

## 2015-05-19 DIAGNOSIS — I10 Essential (primary) hypertension: Secondary | ICD-10-CM | POA: Insufficient documentation

## 2015-05-19 DIAGNOSIS — S52601A Unspecified fracture of lower end of right ulna, initial encounter for closed fracture: Secondary | ICD-10-CM

## 2015-05-19 DIAGNOSIS — Z79899 Other long term (current) drug therapy: Secondary | ICD-10-CM | POA: Diagnosis not present

## 2015-05-19 MED ORDER — SERTRALINE HCL 100 MG PO TABS: 150.0000 mg | ORAL_TABLET | Freq: Every day | ORAL | Status: DC

## 2015-05-19 MED ORDER — BUPROPION HCL ER (XL) 150 MG PO TB24: 150.0000 mg | ORAL_TABLET | Freq: Every day | ORAL | Status: DC

## 2015-05-19 NOTE — ED Notes (Addendum)
Pt fell at home co right lower arm pain. Took oxycodone prior to coming in.

## 2015-05-19 NOTE — ED Notes (Signed)
Pt reports she missed a step and fell at [employer].  Pt has right forearm pain with an abrasion.  Pt also has left knee pain.  No loc.  No headache.  Pt alert. Speech clear.
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
## 2015-05-20 MED ORDER — MORPHINE SULFATE (PF) 4 MG/ML IV SOLN
4.0000 mg | Freq: Once | INTRAVENOUS | Status: AC
  Administered 2015-05-20: 4 mg via INTRAMUSCULAR
  Filled 2015-05-20: qty 1

## 2015-05-20 MED ORDER — OXYCODONE-ACETAMINOPHEN 5-325 MG PO TABS: 1.0000 | ORAL_TABLET | Freq: Four times a day (QID) | ORAL | Status: DC | PRN

## 2015-05-20 NOTE — Discharge Instructions (Signed)
Please call the number provided for orthopedics for follow-up in 1-2 weeks. Please take your pain medication as needed, as prescribed. Return to the emergency department for any worsening pain, or any other symptoms personally concerning to yourself.   Ulnar Fracture An ulnar fracture is a break in the ulna bone, which is the forearm bone that is located on the same side as your little finger. Your forearm is the part of your arm that is between your elbow and your wrist. It is made up of two bones: the radius and ulna. The ulna forms the point of your elbow at its upper end. The lower end can be felt on the outside of your wrist. An ulnar fracture can happen near the wrist or elbow or in the middle of your forearm. Middle forearm fractures usually break both the radius and the ulna. CAUSES A heavy, direct blow to the forearm is the most common cause of an ulnar fracture. It takes a lot of force to break a bone in your forearm. This type of injury may be caused by:  An accident, such as a car or bike accident.  Falling with your arm outstretched. RISK FACTORS You may be at greater risk for an ulnar fracture if you:  Play contact sports.  Have a condition that causes your bones to be weak or thin (osteoporosis). SIGNS AND SYMPTOMS  An ulnar fracture causes pain immediately after the injury. You may need to support your forearm with your other hand. Other signs and symptoms include:  An abnormal bend or bump in your arm (deformity).  Swelling.  Bruising.  Numbness or weakness in your hand.  Inability to turn your hand from side to side (rotate). DIAGNOSIS Your health care provider may diagnose an ulnar fracture based on:  Your symptoms.  Your medical history, including any recent injury.  A physical exam. Your health care provider will look for any deformity and feel for tenderness over the break. Your health care provider will also check whether the bone is out of place.  An  X-ray exam to confirm the diagnosis and learn more about the type of fracture. TREATMENT The goals of treatment are to get the bone in proper position for healing and to keep it from moving so it will heal over time. Your treatment will depend on many factors, especially the type of fracture that you have.  If the fractured bone:  Is in the correct position (nondisplaced), you may only need to wear a cast or a splint.  Has a slightly displaced fracture, you may need to have the bones moved back into place manually (closed reduction) before the splint or cast is put on.  You may have a temporary splint before you have a plaster cast. The splint allows room for some swelling. After a few days, a cast can replace the splint.  You may have to wear the cast for about 6 weeks or as directed by your health care provider.  The cast may be changed after about 3 weeks or as directed by your health care provider.  After your cast is taken off, you may need physical therapy to regain full movement in your wrist or elbow.  You may need emergency surgery if you have:  A fractured bone that is out of position (displaced).  A fracture with multiple fragments (comminuted fracture).  A fracture that breaks the skin (open fracture). This type of fracture may require surgical wires, plates, or screws to hold the  bone in place.  You may have X-rays every couple of weeks to check on your healing. HOME CARE INSTRUCTIONS  Keep the injured arm above the level of your heart while you are sitting or lying down. This helps to reduce swelling and pain.  Apply ice to the injured area:  Put ice in a plastic bag.  Place a towel between your skin and the bag.  Leave the ice on for 20 minutes, 2-3 times per day.  Move your fingers often to avoid stiffness and to minimize swelling.  If you have a plaster or fiberglass cast:  Do not try to scratch the skin under the cast using sharp or pointed  objects.  Check the skin around the cast every day. You may put lotion on any red or sore areas.  Keep your cast dry and clean.  If you have a plaster splint:  Wear the splint as directed.  Loosen the elastic around the splint if your fingers become numb and tingle, or if they turn cold and blue.  Do not put pressure on any part of your cast until it is fully hardened. Rest your cast only on a pillow for the first 24 hours.  Protect your cast or splint while bathing or showering, as directed by your health care provider. Do not put your cast or splint into water.  Take medicines only as directed by your health care provider.  Return to activities, such as sports, as directed by your health care provider. Ask your health care provider what activities are safe for you.  Keep all follow-up visits as directed by your health care provider. This is important. SEEK MEDICAL CARE IF:  Your pain medicine is not helping.  Your cast gets damaged or it breaks.  Your cast becomes loose.  Your cast gets wet.  You have more severe pain or swelling than you did before the cast.  You have severe pain when stretching your fingers.  You continue to have pain or stiffness in your elbow or your wrist after your cast is taken off. SEEK IMMEDIATE MEDICAL CARE IF:  You cannot move your fingers.  You lose feeling in your fingers or your hand.  Your hand or your fingers turn cold and pale or blue.  You notice a bad smell coming from your cast.  You have drainage from underneath your cast.  You have new stains from blood or drainage seeping through your cast.   This information is not intended to replace advice given to you by your health care provider. Make sure you discuss any questions you have with your health care provider.   Document Released: 08/24/2005 Document Revised: 04/03/2014 Document Reviewed: 08/20/2013 Elsevier Interactive Patient Education [employer].

## 2015-05-20 NOTE — ED Provider Notes (Addendum)
[facility] Emergency Department Provider Note  Time seen: 12:38 AM  I have reviewed the triage vital signs and the nursing notes.   HISTORY  Chief Complaint Fall and Arm Injury    HPI Allison Hall is a 75 y.o. female with a past medical history of hypertension, hyperlipidemia, anxiety, gastric reflux who presents the emergency department with right arm pain following a fall. According to the patient she was walking down steps when she misstepped and fell forward hitting her right arm on the door frame and then falling to the ground. Denies hitting her head, denies loss of consciousness, denies taking any blood thinners. Patient's only complaint is of pain to the right wrist/arm. Patient has been ambulatory without difficulty.Describes the pain as moderate in the wrist, worse with any movement of the arm.     Past Medical History  Diagnosis Date  . Hypertension   . Hypercholesteremia   . Anxiety   . GERD (gastroesophageal reflux disease)   . Osteoarthritis   . Colon polyp     Patient Active Problem List   Diagnosis Date Noted  . Triggering of digit 05/27/2014  . Hamstring muscle strain 05/26/2014  . Arthritis of knee, degenerative 03/26/2014  . Long term current use of opiate analgesic 04/21/2013  . Arthritis, degenerative 04/21/2013  . Anxiety 10/05/2011  . Colon polyp 10/05/2011  . Esophageal foreign body 10/05/2011  . Acid reflux 10/05/2011  . BP (high blood pressure) 10/05/2011  . Hypercholesterolemia 10/05/2011    Past Surgical History  Procedure Laterality Date  . Abdominal hysterectomy    . Appendectomy    . Nose surgery    . Replacement total knee      Current Outpatient Rx  Name  Route  Sig  Dispense  Refill  . acetaminophen (TYLENOL) 325 MG tablet   Oral   Take 500 mg by mouth.         Marland Kitchen amLODipine (NORVASC) 5 MG tablet   Oral   Take 5 mg by mouth.         Marland Kitchen buPROPion (WELLBUTRIN XL) 150 MG 24 hr  tablet   Oral   Take 1 tablet (150 mg total) by mouth daily.   30 tablet   1   . Cholecalciferol (VITAMIN D-1000 MAX ST) 1000 units tablet   Oral   Take 1,000 Units by mouth.         Marland Kitchen lisinopril (PRINIVIL,ZESTRIL) 20 MG tablet   Oral   Take 20 mg by mouth.         Marland Kitchen LORazepam (ATIVAN) 1 MG tablet   Oral   Take 1 tablet (1 mg total) by mouth every 8 (eight) hours as needed for anxiety.   30 tablet   0   . meclizine (ANTIVERT) 25 MG tablet   Oral   Take 1 tablet (25 mg total) by mouth 3 (three) times daily as needed for dizziness.   30 tablet   0   . Melatonin 10 MG CAPS   Oral   Take 10 mg by mouth.         . meloxicam (MOBIC) 7.5 MG tablet   Oral   Take 7.5 mg by mouth.         . Omega-3 Fatty Acids (FISH OIL) 1000 MG CAPS   Oral   Take 1,000 mg by mouth.         Marland Kitchen omeprazole (PRILOSEC) 40 MG capsule   Oral   Take 40 mg  by mouth.         . sertraline (ZOLOFT) 100 MG tablet   Oral   Take 1.5 tablets (150 mg total) by mouth daily.   45 tablet   1   . traMADol (ULTRAM) 50 MG tablet   Oral   Take 50 mg by mouth.         . Turmeric Curcumin 500 MG CAPS   Oral   Take 500 mg by mouth.           Allergies Sulfamethoxazole; Statins; and Sulfa antibiotics  Family History  Problem Relation Age of Onset  . Hypertension Mother   . Depression Mother   . Heart failure Father   . Obesity Brother   . Heart disease Brother   . Stroke Brother   . Depression Brother     Social History Social History  Substance Use Topics  . Smoking status: Former Smoker    Quit date: 05/18/1965  . Smokeless tobacco: Never Used  . Alcohol Use: 2.4 oz/week    4 Glasses of wine, 0 Cans of beer, 0 Shots of liquor per week    Review of Systems Constitutional: Negative for fever. Cardiovascular: Negative for chest pain. Respiratory: Negative for shortness of breath. Gastrointestinal: Negative for abdominal pain Musculoskeletal: Positive for right arm  pain Skin: Skin abrasions to right knee and right arm Neurological: Negative for headache 10-point ROS otherwise negative.  ____________________________________________   PHYSICAL EXAM:  VITAL SIGNS: ED Triage Vitals  Enc Vitals Group     BP 05/19/15 2246 185/93 mmHg     Pulse Rate 05/19/15 2246 79     Resp 05/19/15 2246 20     Temp 05/19/15 2246 97.5 F (36.4 C)     Temp Source 05/19/15 2246 Oral     SpO2 05/19/15 2246 99 %     Weight 05/19/15 2246 200 lb (90.719 kg)     Height 05/19/15 2246 5\' 5"  (1.651 m)     Head Cir --      Peak Flow --      Pain Score 05/19/15 2248 1     Pain Loc --      Pain Edu? --      Excl. in [place]? --     Constitutional: Alert and oriented. Well appearing and in no distress. Eyes: Normal exam ENT   Head: Normocephalic and atraumatic   Mouth/Throat: Mucous membranes are moist. Cardiovascular: Normal rate, regular rhythm. No murmur Respiratory: Normal respiratory effort without tachypnea nor retractions. Breath sounds are clear Gastrointestinal: Soft and nontender. No distention.  Musculoskeletal: Moderate tenderness to palpation around the right wrist especially over the distal ulna. Abrasions to the mid forearm (not overlying the fracture site). 2 small abrasions to right knee. Good range of motion. Neurovascularly intact distally, warm and with good cap refill, sensation intact and normal. Neurologic:  Normal speech and language. No gross focal neurologic deficits Skin:  Skin is warm, dry and intact.  Psychiatric: Mood and affect are normal. Speech and behavior are normal.  ____________________________________________   RADIOLOGY  X-ray consistent with distal ulna fracture.  ____________________________________________    INITIAL IMPRESSION / ASSESSMENT AND PLAN / ED COURSE  Pertinent labs & imaging results that were available during my care of the patient were reviewed by me and considered in my medical decision making (see  chart for details).  X-ray consistent with distal ulna fracture. She has 2 small abrasions to the right mid forearm, 2 abrasions to the right knee.  No other traumatic injuries identified on examination. We will place the patient in a ulnar gutter splint, have her follow up with orthopedics in 1-2 weeks for recheck/reevaluation. Patient agreeable to plan.  ____________________________________________   FINAL CLINICAL IMPRESSION(S) / ED DIAGNOSES  Distal ulna fracture   Harvest Dark, MD 05/20/15 0041  Harvest Dark, MD 05/20/15 [phone]

## 2015-06-01 DIAGNOSIS — S52621A Torus fracture of lower end of right ulna, initial encounter for closed fracture: Secondary | ICD-10-CM | POA: Diagnosis not present

## 2015-06-01 DIAGNOSIS — S52601A Unspecified fracture of lower end of right ulna, initial encounter for closed fracture: Secondary | ICD-10-CM | POA: Diagnosis not present

## 2015-06-01 DIAGNOSIS — S52609A Unspecified fracture of lower end of unspecified ulna, initial encounter for closed fracture: Secondary | ICD-10-CM | POA: Insufficient documentation

## 2015-06-07 ENCOUNTER — Ambulatory Visit (INDEPENDENT_AMBULATORY_CARE_PROVIDER_SITE_OTHER): Payer: PPO | Admitting: Psychiatry

## 2015-06-07 ENCOUNTER — Encounter: Payer: Self-pay | Admitting: Psychiatry

## 2015-06-07 DIAGNOSIS — F331 Major depressive disorder, recurrent, moderate: Secondary | ICD-10-CM | POA: Diagnosis not present

## 2015-06-07 DIAGNOSIS — F411 Generalized anxiety disorder: Secondary | ICD-10-CM

## 2015-06-07 MED ORDER — BUPROPION HCL ER (SR) 100 MG PO TB12: 100.0000 mg | ORAL_TABLET | Freq: Every day | ORAL | Status: DC

## 2015-06-07 MED ORDER — TRAZODONE HCL 50 MG PO TABS: 50.0000 mg | ORAL_TABLET | Freq: Every day | ORAL | Status: DC
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
## 2015-06-08 DIAGNOSIS — D485 Neoplasm of uncertain behavior of skin: Secondary | ICD-10-CM | POA: Diagnosis not present

## 2015-06-08 DIAGNOSIS — L738 Other specified follicular disorders: Secondary | ICD-10-CM | POA: Diagnosis not present

## 2015-06-08 DIAGNOSIS — L570 Actinic keratosis: Secondary | ICD-10-CM | POA: Diagnosis not present

## 2015-06-17 DIAGNOSIS — S52501D Unspecified fracture of the lower end of right radius, subsequent encounter for closed fracture with routine healing: Secondary | ICD-10-CM | POA: Diagnosis not present

## 2015-06-17 DIAGNOSIS — S52601D Unspecified fracture of lower end of right ulna, subsequent encounter for closed fracture with routine healing: Secondary | ICD-10-CM | POA: Diagnosis not present

## 2015-07-01 DIAGNOSIS — F329 Major depressive disorder, single episode, unspecified: Secondary | ICD-10-CM | POA: Diagnosis not present

## 2015-07-01 DIAGNOSIS — E785 Hyperlipidemia, unspecified: Secondary | ICD-10-CM | POA: Diagnosis not present

## 2015-07-01 DIAGNOSIS — I10 Essential (primary) hypertension: Secondary | ICD-10-CM | POA: Diagnosis not present

## 2015-07-01 DIAGNOSIS — E559 Vitamin D deficiency, unspecified: Secondary | ICD-10-CM | POA: Diagnosis not present

## 2015-07-01 DIAGNOSIS — G4700 Insomnia, unspecified: Secondary | ICD-10-CM | POA: Diagnosis not present

## 2015-07-01 DIAGNOSIS — F419 Anxiety disorder, unspecified: Secondary | ICD-10-CM | POA: Diagnosis not present

## 2015-07-08 ENCOUNTER — Ambulatory Visit
Admission: EM | Admit: 2015-07-08 | Discharge: 2015-07-08 | Disposition: A | Discharge status: Home / Self Care | Payer: PPO | Attending: Family Medicine | Admitting: Family Medicine

## 2015-07-08 ENCOUNTER — Encounter: Payer: Self-pay | Admitting: *Deleted

## 2015-07-08 DIAGNOSIS — R42 Dizziness and giddiness: Secondary | ICD-10-CM

## 2015-07-08 DIAGNOSIS — E860 Dehydration: Secondary | ICD-10-CM

## 2015-07-08 DIAGNOSIS — T50905A Adverse effect of unspecified drugs, medicaments and biological substances, initial encounter: Secondary | ICD-10-CM

## 2015-07-08 DIAGNOSIS — E876 Hypokalemia: Secondary | ICD-10-CM

## 2015-07-08 DIAGNOSIS — E871 Hypo-osmolality and hyponatremia: Secondary | ICD-10-CM | POA: Diagnosis not present

## 2015-07-08 DIAGNOSIS — T887XXA Unspecified adverse effect of drug or medicament, initial encounter: Secondary | ICD-10-CM

## 2015-07-08 LAB — BASIC METABOLIC PANEL
ANION GAP: 3 — AB (ref 5–15)
BUN: 23 mg/dL — ABNORMAL HIGH (ref 6–20)
CHLORIDE: 102 mmol/L (ref 101–111)
CO2: 27 mmol/L (ref 22–32)
Calcium: 9.2 mg/dL (ref 8.9–10.3)
Creatinine, Ser: 1.05 mg/dL — ABNORMAL HIGH (ref 0.44–1.00)
GFR calc non Af Amer: 50 mL/min — ABNORMAL LOW (ref >60)
GFR, EST AFRICAN AMERICAN: 58 mL/min — AB (ref >60)
Glucose, Bld: 86 mg/dL (ref 65–99)
POTASSIUM: 3.4 mmol/L — AB (ref 3.5–5.1)
SODIUM: 132 mmol/L — AB (ref 135–145)

## 2015-07-08 LAB — CBC WITH DIFFERENTIAL/PLATELET
Basophils Absolute: 0.1 10*3/uL (ref 0–0.1)
Basophils Relative: 1 %
EOS ABS: 0.2 10*3/uL (ref 0–0.7)
Eosinophils Relative: 2 %
HEMATOCRIT: 34.5 % — AB (ref 35.0–47.0)
HEMOGLOBIN: 11.6 g/dL — AB (ref 12.0–16.0)
LYMPHS ABS: 2.2 10*3/uL (ref 1.0–3.6)
LYMPHS PCT: 30 %
MCH: 29.5 pg (ref 26.0–34.0)
MCHC: 33.6 g/dL (ref 32.0–36.0)
MCV: 87.6 fL (ref 80.0–100.0)
Monocytes Absolute: 0.8 10*3/uL (ref 0.2–0.9)
Monocytes Relative: 11 %
NEUTROS ABS: 4.1 10*3/uL (ref 1.4–6.5)
NEUTROS PCT: 56 %
Platelets: 222 10*3/uL (ref 150–440)
RBC: 3.94 MIL/uL (ref 3.80–5.20)
RDW: 14.3 % (ref 11.5–14.5)
WBC: 7.3 10*3/uL (ref 3.6–11.0)

## 2015-07-08 LAB — URINALYSIS COMPLETE WITH MICROSCOPIC (ARMC ONLY)
Bacteria, UA: NONE SEEN
Bilirubin Urine: NEGATIVE
Glucose, UA: NEGATIVE mg/dL
Hgb urine dipstick: NEGATIVE
Ketones, ur: NEGATIVE mg/dL
Nitrite: NEGATIVE
PROTEIN: NEGATIVE mg/dL
Squamous Epithelial / LPF: NONE SEEN
pH: 5.5 (ref 5.0–8.0)

## 2015-07-08 NOTE — Discharge Instructions (Signed)
Dehydration, Adult Dehydration means your body does not have as much fluid or water as it needs. It happens when you take in less fluid than you lose. Your kidneys, brain, and heart will not work properly without the right amount of fluids.  Dehydration can range from mild to severe. It should be treated right away to help prevent it from becoming severe. HOME CARE  Drink enough fluid to keep your pee (urine) clear or pale yellow.  Drink water or fluid slowly by taking small sips. You can also try sucking on ice cubes.  Have food or drinks that contain electrolytes. Examples include bananas and sports drinks.  Take over-the-counter and prescription medicines only as told by your doctor.  Prepare oral rehydration solution (ORS) according to the instructions that came with it. Take sips of ORS every 5 minutes until your pee returns to normal.  If you are throwing up (vomiting) or have watery poop (diarrhea), keep trying to drink water, ORS, or both.  If you have watery poop, avoid:  Drinks with caffeine.  Fruit juice.  Milk.  Carbonated soft drinks.  Do not take salt tablets. This can lead to having too much sodium in your body (hypernatremia). GET HELP IF:  You cannot eat or drink without throwing up.  You have had mild watery poop for longer than 24 hours.  You have a fever. GET HELP RIGHT AWAY IF:   You have very strong thirst.  You have very bad watery poop.  You have not peed in 6-8 hours, or you have peed only a small amount of very dark pee.  You have shriveled skin.  You are dizzy, confused, or both.   This information is not intended to replace advice given to you by your health care provider. Make sure you discuss any questions you have with your health care provider.   Document Released: 01/07/2009 Document Revised: 12/02/2014 Document Reviewed: 07/29/2014 Elsevier Interactive Patient Education 2016 [employer]  Dizziness Dizziness is a common problem.  It makes you feel unsteady or lightheaded. You may feel like you are about to pass out (faint). Dizziness can lead to injury if you stumble or fall. Anyone can get dizzy, but dizziness is more common in older adults. This condition can be caused by a number of things, including:  Medicines.  Dehydration.  Illness. HOME CARE Following these instructions may help with your condition: Eating and Drinking  Drink enough fluid to keep your pee (urine) clear or pale yellow. This helps to keep you from getting dehydrated. Try to drink more clear fluids, such as water.  Do not drink alcohol.  Limit how much caffeine you drink or eat if told by your doctor.  Limit how much salt you drink or eat if told by your doctor. Activity  Avoid making quick movements.  When you stand up from sitting in a chair, steady yourself until you feel okay.  In the morning, first sit up on the side of the bed. When you feel okay, stand slowly while you hold onto something. Do this until you know that your balance is fine.  Move your legs often if you need to stand in one place for a long time. Tighten and relax your muscles in your legs while you are standing.  Do not drive or use heavy machinery if you feel dizzy.  Avoid bending down if you feel dizzy. Place items in your home so that they are easy for you to reach without leaning over.  Lifestyle  Do not use any tobacco products, including cigarettes, chewing tobacco, or electronic cigarettes. If you need help quitting, ask your doctor.  Try to lower your stress level, such as with yoga or meditation. Talk with your doctor if you need help. General Instructions  Watch your dizziness for any changes.  Take medicines only as told by your doctor. Talk with your doctor if you think that your dizziness is caused by a medicine that you are taking.  Tell a friend or a family member that you are feeling dizzy. If he or she notices any changes in your behavior,  have this person call your doctor.  Keep all follow-up visits as told by your doctor. This is important. GET HELP IF:  Your dizziness does not go away.  Your dizziness or light-headedness gets worse.  You feel sick to your stomach (nauseous).  You have trouble hearing.  You have new symptoms.  You are unsteady on your feet or you feel like the room is spinning. GET HELP RIGHT AWAY IF:  You throw up (vomit) or have diarrhea and are unable to eat or drink anything.  You have trouble:  Talking.  Walking.  Swallowing.  Using your arms, hands, or legs.  You feel generally weak.  You are not thinking clearly or you have trouble forming sentences. It may take a friend or family member to notice this.  You have:  Chest pain.  Pain in your belly (abdomen).  Shortness of breath.  Sweating.  Your vision changes.  You are bleeding.  You have a headache.  You have neck pain or a stiff neck.  You have a fever.   This information is not intended to replace advice given to you by your health care provider. Make sure you discuss any questions you have with your health care provider.   Document Released: 03/02/2011 Document Revised: 07/28/2014 Document Reviewed: 03/09/2014 Elsevier Interactive Patient Education 2016 [employer].  Hypokalemia Hypokalemia means that the amount of potassium in the blood is lower than normal.Potassium is a chemical, called an electrolyte, that helps regulate the amount of fluid in the body. It also stimulates muscle contraction and helps nerves function properly.Most of the body's potassium is inside of cells, and only a very small amount is in the blood. Because the amount in the blood is so small, minor changes can be life-threatening. CAUSES  Antibiotics.  Diarrhea or vomiting.  Using laxatives too much, which can cause diarrhea.  Chronic kidney disease.  Water pills (diuretics).  Eating disorders (bulimia).  Low magnesium  level.  Sweating a lot. SIGNS AND SYMPTOMS  Weakness.  Constipation.  Fatigue.  Muscle cramps.  Mental confusion.  Skipped heartbeats or irregular heartbeat (palpitations).  Tingling or numbness. DIAGNOSIS  Your health care provider can diagnose hypokalemia with blood tests. In addition to checking your potassium level, your health care provider may also check other lab tests. TREATMENT Hypokalemia can be treated with potassium supplements taken by mouth or adjustments in your current medicines. If your potassium level is very low, you may need to get potassium through a vein (IV) and be monitored in the hospital. A diet high in potassium is also helpful. Foods high in potassium are:  Nuts, such as peanuts and pistachios.  Seeds, such as sunflower seeds and pumpkin seeds.  Peas, lentils, and lima beans.  Whole grain and bran cereals and breads.  Fresh fruit and vegetables, such as apricots, avocado, bananas, cantaloupe, kiwi, oranges, tomatoes, asparagus, and potatoes.  Orange and tomato juices.  Red meats.  Fruit yogurt. HOME CARE INSTRUCTIONS  Take all medicines as prescribed by your health care provider.  Maintain a healthy diet by including nutritious food, such as fruits, vegetables, nuts, whole grains, and lean meats.  If you are taking a laxative, be sure to follow the directions on the label. SEEK MEDICAL CARE IF:  Your weakness gets worse.  You feel your heart pounding or racing.  You are vomiting or having diarrhea.  You are diabetic and having trouble keeping your blood glucose in the normal range. SEEK IMMEDIATE MEDICAL CARE IF:  You have chest pain, shortness of breath, or dizziness.  You are vomiting or having diarrhea for more than 2 days.  You faint. MAKE SURE YOU:   Understand these instructions.  Will watch your condition.  Will get help right away if you are not doing well or get worse.   This information is not intended to  replace advice given to you by your health care provider. Make sure you discuss any questions you have with your health care provider.   Document Released: 03/13/2005 Document Revised: 04/03/2014 Document Reviewed: 09/13/2012 Elsevier Interactive Patient Education 2016 [place].  Hyponatremia Hyponatremia is when the amount of salt (sodium) in your blood is too low. When salt levels are low, your cells absorb extra water and they swell. The swelling happens throughout the body, but it mostly affects the brain.  HOME CARE  Take medicines only as told by your doctor. Many medicines can make this condition worse. Talk with your doctor about any medicines that you are currently taking.  Carefully follow a recommended diet as told by your doctor.  Carefully follow instructions from your doctor about fluid restrictions.  Keep all follow-up visits as told by your doctor. This is important.  Do not drink alcohol. GET HELP IF:  You feel sicker to your stomach (nauseous).  You feel more confused.  You feel more tired (fatigued).  Your headache gets worse.  You feel weaker.  Your symptoms go away and then they come back.  You have trouble following the diet instructions. GET HELP RIGHT AWAY IF:  You start to twitch and shake (have a seizure).  You pass out (faint).  You keep having watery poop (diarrhea).  You keep throwing up (vomiting).   This information is not intended to replace advice given to you by your health care provider. Make sure you discuss any questions you have with your health care provider.   Document Released: 11/23/2010 Document Revised: 07/28/2014 Document Reviewed: 03/09/2014 Elsevier Interactive Patient Education [employer].

## 2015-07-08 NOTE — ED Notes (Signed)
C/o weakness, "unsteadiness", and has hx of hyponatremia. States these symptoms similar to those previously experienced.

## 2015-07-08 NOTE — ED Provider Notes (Signed)
CSN: [id number]     Arrival date & time 07/08/15  1714 History   First MD Initiated Contact with Patient 07/08/15 1806    Nurses notes were reviewed. Chief Complaint  Patient presents with  . Weakness   Patient is here because of generalized weakness. She states that this generalized weakness been going on for several days now less than getting worse. She is worried because she had hyponatremia last year. States she was seen here today for possible UTI and told to hydrate himself and then she wound up with low sodium and in the hospital getting IV fluids. She states that she was worried and concerned that maybe something similar. She had a fall in February injuring her right forearm seeing [place].   She had a history of hypertension hypercholesterolemia anxiety her osteoarthritis and chronic polyps. 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 She was wanting whether that may be causing the problem she took the Desyrel for about a week and now she doesn't feel that good. Last night she slept the best on his melatonin and Tylenol and she has a prescription for Lunesta that was probably approved coming in. She is concerned afraid of taking the Desyrel and I suggest that she wants to go back and as well she started off maybe half tablet. Once again the psychiatrist her on Desyrel 100 take 1 and primary care mentioned medication 2. Portion she does not know the dosage of medication.   She does not having chest pain or shortness of breath. He is a former smoker. There is a significant history hypertension in mother with depression heart failure father and obesity heart disease stroke depression in her brother.   (Consider location/radiation/quality/duration/timing/severity/associated sxs/prior Treatment) Patient is a 75 y.o. female presenting with  weakness. The history is provided by the patient. No language interpreter was used.  Weakness This is a new problem. The current episode started more than 2 days ago. The problem has been gradually worsening. Associated symptoms include headaches. Pertinent negatives include no chest pain, no abdominal pain and no shortness of breath. Associated symptoms comments: States the headaches are very infrequently to come and go.  She's had headaches like this before.. Nothing aggravates the symptoms. Nothing relieves the symptoms. She has tried nothing for the symptoms.    Past Medical History  Diagnosis Date  . Hypertension   . Hypercholesteremia   . Anxiety   . GERD (gastroesophageal reflux disease)   . Osteoarthritis   . Colon polyp    Past Surgical History  Procedure Laterality Date  . Abdominal hysterectomy    . Appendectomy    . Nose surgery    . Replacement total knee     Family History  Problem Relation Age of Onset  . Hypertension Mother   . Depression Mother   . Heart failure Father   . Obesity Brother   . Heart disease Brother   . Stroke Brother   . Depression Brother    Social History  Substance Use Topics  . Smoking status: Former Smoker    Quit date: 05/18/1965  . Smokeless tobacco: Never Used  . Alcohol Use: 2.4 oz/week    4 Glasses of wine, 0 Cans of beer, 0 Shots of liquor per week   OB History    No data available  Review of Systems  Respiratory: Negative for shortness of breath.   Cardiovascular: Negative for chest pain.  Gastrointestinal: Negative for abdominal pain.  Neurological: Positive for weakness and headaches.  All other systems reviewed and are negative.   Allergies  Sulfamethoxazole; Statins; and Sulfa antibiotics  Home Medications   Prior to Admission medications   Medication Sig Start Date End Date Taking? Authorizing Provider  acetaminophen (TYLENOL) 325 MG tablet Take 500 mg by mouth.   Yes Historical Provider, MD  amLODipine  (NORVASC) 5 MG tablet Take 5 mg by mouth. 10/20/14 10/20/15 Yes Historical Provider, MD  buPROPion (WELLBUTRIN SR) 100 MG 12 hr tablet Take 1 tablet (100 mg total) by mouth daily. 06/07/15 06/06/16 Yes Himabindu Ravi, MD  Cholecalciferol (VITAMIN D-1000 MAX ST) 1000 units tablet Take 1,000 Units by mouth.   Yes Historical Provider, MD  Iodine, Kelp, (KELP PO) Take by mouth.   Yes Historical Provider, MD  lisinopril (PRINIVIL,ZESTRIL) 20 MG tablet Take 20 mg by mouth. 04/29/15 02/14/16 Yes Historical Provider, MD  Melatonin 10 MG CAPS Take 10 mg by mouth.   Yes Historical Provider, MD  meloxicam (MOBIC) 7.5 MG tablet Take 7.5 mg by mouth. 10/20/14  Yes Historical Provider, MD  Omega-3 Fatty Acids (FISH OIL) 1000 MG CAPS Take 1,000 mg by mouth.   Yes Historical Provider, MD  omeprazole (PRILOSEC) 40 MG capsule Take 40 mg by mouth. 10/20/14 10/20/15 Yes Historical Provider, MD  sertraline (ZOLOFT) 100 MG tablet Take 1.5 tablets (150 mg total) by mouth daily. 05/19/15 05/18/16 Yes Himabindu Ravi, MD  traZODone (DESYREL) 50 MG tablet Take 1 tablet (50 mg total) by mouth at bedtime. 06/07/15  Yes Himabindu Ravi, MD  Turmeric Curcumin 500 MG CAPS Take 500 mg by mouth.   Yes Historical Provider, MD  vitamin B-12 (CYANOCOBALAMIN) 50 MCG tablet Take 50 mcg by mouth daily.   Yes Historical Provider, MD  HYDROcodone-acetaminophen (NORCO/VICODIN) 5-325 MG tablet Take by mouth. 06/01/15   Historical Provider, MD  LORazepam (ATIVAN) 1 MG tablet Take 1 tablet (1 mg total) by mouth every 8 (eight) hours as needed for anxiety. 11/29/14 11/29/15  Daymon Larsen, MD  meclizine (ANTIVERT) 25 MG tablet Take 1 tablet (25 mg total) by mouth 3 (three) times daily as needed for dizziness. 11/29/14   Daymon Larsen, MD  oxyCODONE-acetaminophen (ROXICET) 5-325 MG tablet Take 1 tablet by mouth every 6 (six) hours as needed. 05/20/15   Harvest Dark, MD   Meds Ordered and Administered this Visit  Medications - No data to display  BP 143/69  mmHg  Pulse 83  Temp(Src) 98.1 F (36.7 C) (Oral)  Resp 16  Ht 5\' 6"  (1.676 m)  Wt 198 lb (89.812 kg)  BMI 31.97 kg/m2  SpO2 97% No data found.   Physical Exam  Constitutional: She is oriented to person, place, and time. She appears well-developed and well-nourished.  HENT:  Head: Normocephalic and atraumatic.  Right Ear: External ear normal. A foreign body is present.  Left Ear: External ear normal.  Nose: Nose normal. No mucosal edema or rhinorrhea. Right sinus exhibits no maxillary sinus tenderness and no frontal sinus tenderness. Left sinus exhibits no maxillary sinus tenderness and no frontal sinus tenderness.  Mouth/Throat: Uvula is midline. No posterior oropharyngeal edema or posterior oropharyngeal erythema.  Right TM was occluded with cerumen  Eyes: Pupils are equal, round, and reactive to light.  Neck: Neck supple. No tracheal deviation present.  Cardiovascular: Normal rate, regular rhythm and normal heart  sounds.   Pulmonary/Chest: Effort normal.  Musculoskeletal: Normal range of motion.  Neurological: She is alert and oriented to person, place, and time.  Skin: Skin is warm and dry.  Vitals reviewed.   ED Course  Procedures (including critical care time)  Labs Review Labs Reviewed  BASIC METABOLIC PANEL - Abnormal; Notable for the following:    Sodium 132 (*)    Potassium 3.4 (*)    BUN 23 (*)    Creatinine, Ser 1.05 (*)    GFR calc non Af Amer 50 (*)    GFR calc Af Amer 58 (*)    Anion gap 3 (*)    All other components within normal limits  CBC WITH DIFFERENTIAL/PLATELET - Abnormal; Notable for the following:    Hemoglobin 11.6 (*)    HCT 34.5 (*)    All other components within normal limits  URINALYSIS COMPLETEWITH MICROSCOPIC ([facility] ONLY) - Abnormal; Notable for the following:    Color, Urine STRAW (*)    Specific Gravity, Urine <1.005 (*)    Leukocytes, UA TRACE (*)    All other components within normal limits  URINE CULTURE    Imaging  Review No results found.   Visual Acuity Review  Right Eye Distance:   Left Eye Distance:   Bilateral Distance:    Right Eye Near:   Left Eye Near:    Bilateral Near:     Results for orders placed or performed during the hospital encounter of [id number]  Basic metabolic panel  Result Value Ref Range   Sodium 132 (L) 135 - 145 mmol/L   Potassium 3.4 (L) 3.5 - 5.1 mmol/L   Chloride 102 101 - 111 mmol/L   CO2 27 22 - 32 mmol/L   Glucose, Bld 86 65 - 99 mg/dL   BUN 23 (H) 6 - 20 mg/dL   Creatinine, Ser 1.05 (H) 0.44 - 1.00 mg/dL   Calcium 9.2 8.9 - 10.3 mg/dL   GFR calc non Af Amer 50 (L) >60 mL/min   GFR calc Af Amer 58 (L) >60 mL/min   Anion gap 3 (L) 5 - 15  CBC with Differential  Result Value Ref Range   WBC 7.3 3.6 - 11.0 K/uL   RBC 3.94 3.80 - 5.20 MIL/uL   Hemoglobin 11.6 (L) 12.0 - 16.0 g/dL   HCT 34.5 (L) 35.0 - 47.0 %   MCV 87.6 80.0 - 100.0 fL   MCH 29.5 26.0 - 34.0 pg   MCHC 33.6 32.0 - 36.0 g/dL   RDW 14.3 11.5 - 14.5 %   Platelets 222 150 - 440 K/uL   Neutrophils Relative % 56 %   Neutro Abs 4.1 1.4 - 6.5 K/uL   Lymphocytes Relative 30 %   Lymphs Abs 2.2 1.0 - 3.6 K/uL   Monocytes Relative 11 %   Monocytes Absolute 0.8 0.2 - 0.9 K/uL   Eosinophils Relative 2 %   Eosinophils Absolute 0.2 0 - 0.7 K/uL   Basophils Relative 1 %   Basophils Absolute 0.1 0 - 0.1 K/uL  Urinalysis complete, with microscopic  Result Value Ref Range   Color, Urine STRAW (A) YELLOW   APPearance CLEAR CLEAR   Glucose, UA NEGATIVE NEGATIVE mg/dL   Bilirubin Urine NEGATIVE NEGATIVE   Ketones, ur NEGATIVE NEGATIVE mg/dL   Specific Gravity, Urine <1.005 (L) 1.005 - 1.030   Hgb urine dipstick NEGATIVE NEGATIVE   pH 5.5 5.0 - 8.0   Protein, ur NEGATIVE NEGATIVE mg/dL   Nitrite NEGATIVE  NEGATIVE   Leukocytes, UA TRACE (A) NEGATIVE   RBC / HPF 0-5 0 - 5 RBC/hpf   WBC, UA 0-5 0 - 5 WBC/hpf   Bacteria, UA NONE SEEN NONE SEEN   Squamous Epithelial / LPF NONE SEEN NONE SEEN       MDM   1. Dizziness, nonspecific   2. Hyponatremia   3. Hypokalemia   4. Medication adverse effect, initial encounter   5. Mild dehydration    At this time nonspecific findings to explain her dizziness. She has mild hyponatremia, hypokalemia, and mild dehydration.. She also has mild anemia and mild renal failure explained to her that since she's been drinking more water lately rather than just drinking free water she may want drink some Pedialyte or half strength right 0. She wants to use Pedialyte which is fine. But that since it has irregular flights not found border that be more affectations for her to maintain. Electrolyte balance. Also hopefully that will help the mild renal failure. As far as a mild anemia she needs follow-up with her doctor on that.   We also have a situation where she thinks does cause problem with her dizziness and mentation and I recommend that she stop the Desyrel she is overweight to [place] medication comes in and try that. At this time she does not seem to have a UTI but we did culture her urine just to make sure. Will have a follow-up with her PCP Monday if not better or to the emergency room if symptoms become worse.    Note: This dictation was prepared with Dragon dictation along with smaller phrase technology. Any transcriptional errors that result from this process are unintentional.    Frederich Cha, MD 07/08/15 1907

## 2015-07-10 LAB — URINE CULTURE: Special Requests: NORMAL

## 2015-07-15 ENCOUNTER — Other Ambulatory Visit: Payer: Self-pay | Admitting: Family Medicine

## 2015-07-15 DIAGNOSIS — Z1231 Encounter for screening mammogram for malignant neoplasm of breast: Secondary | ICD-10-CM

## 2015-07-15 DIAGNOSIS — S52691D Other fracture of lower end of right ulna, subsequent encounter for closed fracture with routine healing: Secondary | ICD-10-CM | POA: Diagnosis not present

## 2015-07-21 ENCOUNTER — Ambulatory Visit
Admission: RE | Admit: 2015-07-21 | Discharge: 2015-07-21 | Disposition: A | Discharge status: Home / Self Care | Payer: PPO | Source: Ambulatory Visit | Attending: Family Medicine | Admitting: Family Medicine

## 2015-07-21 DIAGNOSIS — Z1231 Encounter for screening mammogram for malignant neoplasm of breast: Secondary | ICD-10-CM | POA: Diagnosis not present

## 2015-07-21 HISTORY — DX: Malignant (primary) neoplasm, unspecified: C80.1

## 2015-07-30 ENCOUNTER — Other Ambulatory Visit: Payer: Self-pay | Admitting: Psychiatry

## 2015-08-04 ENCOUNTER — Ambulatory Visit (INDEPENDENT_AMBULATORY_CARE_PROVIDER_SITE_OTHER): Payer: PPO | Admitting: Psychiatry

## 2015-08-04 ENCOUNTER — Encounter: Payer: Self-pay | Admitting: Psychiatry

## 2015-08-04 DIAGNOSIS — F339 Major depressive disorder, recurrent, unspecified: Secondary | ICD-10-CM

## 2015-08-04 DIAGNOSIS — F411 Generalized anxiety disorder: Secondary | ICD-10-CM
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
## 2015-08-09 ENCOUNTER — Other Ambulatory Visit: Payer: Self-pay

## 2015-08-12 DIAGNOSIS — S52691D Other fracture of lower end of right ulna, subsequent encounter for closed fracture with routine healing: Secondary | ICD-10-CM | POA: Diagnosis not present

## 2015-08-16 DIAGNOSIS — R5383 Other fatigue: Secondary | ICD-10-CM | POA: Diagnosis not present

## 2015-08-16 DIAGNOSIS — I10 Essential (primary) hypertension: Secondary | ICD-10-CM | POA: Diagnosis not present

## 2015-08-16 DIAGNOSIS — Z87891 Personal history of nicotine dependence: Secondary | ICD-10-CM | POA: Diagnosis not present

## 2015-08-16 DIAGNOSIS — G479 Sleep disorder, unspecified: Secondary | ICD-10-CM | POA: Diagnosis not present

## 2015-08-16 DIAGNOSIS — K219 Gastro-esophageal reflux disease without esophagitis: Secondary | ICD-10-CM | POA: Diagnosis not present

## 2015-08-16 DIAGNOSIS — E559 Vitamin D deficiency, unspecified: Secondary | ICD-10-CM | POA: Diagnosis not present

## 2015-08-16 DIAGNOSIS — F419 Anxiety disorder, unspecified: Secondary | ICD-10-CM | POA: Diagnosis not present

## 2015-08-18 ENCOUNTER — Ambulatory Visit: Payer: PPO | Admitting: Psychiatry

## 2015-09-06 ENCOUNTER — Other Ambulatory Visit: Payer: Self-pay | Admitting: Family Medicine

## 2015-09-06 DIAGNOSIS — M2042 Other hammer toe(s) (acquired), left foot: Secondary | ICD-10-CM | POA: Diagnosis not present

## 2015-09-06 DIAGNOSIS — Z780 Asymptomatic menopausal state: Secondary | ICD-10-CM

## 2015-09-06 DIAGNOSIS — M79672 Pain in left foot: Secondary | ICD-10-CM | POA: Diagnosis not present

## 2015-09-29 ENCOUNTER — Telehealth: Payer: Self-pay | Admitting: *Deleted

## 2015-09-29 NOTE — Telephone Encounter (Signed)
lmv made pt aware that I was returning her and and to please call back so that I can better assist her...Marland KitchenTD

## 2015-09-30 ENCOUNTER — Ambulatory Visit
Admission: RE | Admit: 2015-09-30 | Discharge: 2015-09-30 | Disposition: A | Discharge status: Home / Self Care | Payer: PPO | Source: Ambulatory Visit | Attending: Family Medicine | Admitting: Family Medicine

## 2015-09-30 DIAGNOSIS — Z13820 Encounter for screening for osteoporosis: Secondary | ICD-10-CM | POA: Diagnosis not present

## 2015-09-30 DIAGNOSIS — G5601 Carpal tunnel syndrome, right upper limb: Secondary | ICD-10-CM | POA: Diagnosis not present

## 2015-09-30 DIAGNOSIS — M8588 Other specified disorders of bone density and structure, other site: Secondary | ICD-10-CM | POA: Diagnosis not present

## 2015-09-30 DIAGNOSIS — M25531 Pain in right wrist: Secondary | ICD-10-CM | POA: Diagnosis not present

## 2015-09-30 DIAGNOSIS — Z780 Asymptomatic menopausal state: Secondary | ICD-10-CM | POA: Insufficient documentation

## 2015-09-30 DIAGNOSIS — M654 Radial styloid tenosynovitis [de Quervain]: Secondary | ICD-10-CM | POA: Diagnosis not present

## 2015-09-30 DIAGNOSIS — M25532 Pain in left wrist: Secondary | ICD-10-CM | POA: Diagnosis not present

## 2015-09-30 DIAGNOSIS — M85851 Other specified disorders of bone density and structure, right thigh: Secondary | ICD-10-CM | POA: Diagnosis not present

## 2015-11-08 ENCOUNTER — Encounter: Payer: Self-pay | Admitting: *Deleted

## 2015-11-08 ENCOUNTER — Ambulatory Visit
Admission: EM | Admit: 2015-11-08 | Discharge: 2015-11-08 | Disposition: A | Discharge status: Home / Self Care | Payer: PPO | Attending: Internal Medicine | Admitting: Internal Medicine

## 2015-11-08 DIAGNOSIS — J069 Acute upper respiratory infection, unspecified: Secondary | ICD-10-CM

## 2015-11-08 MED ORDER — AZITHROMYCIN 250 MG PO TABS: 250.0000 mg | ORAL_TABLET | Freq: Every day | ORAL | 0 refills | Status: DC

## 2015-11-08 NOTE — ED Provider Notes (Signed)
[facility]    CSN: [id number] Arrival date & time: 11/08/15  1641    History   Chief Complaint Chief Complaint  Patient presents with  . Cough  . Sore Throat    HPI Allison Hall is a 75 y.o. female. She presents today with 2-3 days history of sore throat, runny/congested nose, dry cough. This particularly bothers her at night, she has been up all night coughing a couple of nights. No fever. She has had some headache. No nausea/vomiting, has had a little bit of diarrhea.  A friend of hers has had similar symptoms, now resolved.   HPI  Past Medical History:  Diagnosis Date  . Anxiety   . Cancer ([place])    skin ca  . Colon polyp   . GERD (gastroesophageal reflux disease)   . Hypercholesteremia   . Hypertension   . Osteoarthritis     Patient Active Problem List   Diagnosis Date Noted  . Closed fracture of distal end of ulna 06/01/2015  . Triggering of digit 05/27/2014  . Hamstring muscle strain 05/26/2014  . Arthritis of knee, degenerative 03/26/2014  . Long term current use of opiate analgesic 04/21/2013  . Arthritis, degenerative 04/21/2013  . Anxiety 10/05/2011  . Colon polyp 10/05/2011  . Esophageal foreign body 10/05/2011  . Acid reflux 10/05/2011  . BP (high blood pressure) 10/05/2011  . Hypercholesterolemia 10/05/2011    Past Surgical History:  Procedure Laterality Date  . ABDOMINAL HYSTERECTOMY    . APPENDECTOMY    . NOSE SURGERY    . REPLACEMENT TOTAL KNEE         Home Medications    Prior to Admission medications   Medication Sig Start Date End Date Taking? Authorizing Provider  acetaminophen (TYLENOL) 325 MG tablet Take 500 mg by mouth.   Yes Historical Provider, MD  buPROPion (WELLBUTRIN SR) 100 MG 12 hr tablet Take 1 tablet (100 mg total) by mouth daily. 06/07/15 06/06/16 Yes Himabindu Ravi, MD  Cholecalciferol (VITAMIN D-1000 MAX ST) 1000 units tablet Take 1,000 Units by mouth.   Yes Historical Provider, MD    lisinopril (PRINIVIL,ZESTRIL) 20 MG tablet Take 20 mg by mouth. 04/29/15 02/14/16 Yes Historical Provider, MD  Magnesium 250 MG TABS Take 2 tablets by mouth daily.   Yes Historical Provider, MD  meloxicam (MOBIC) 7.5 MG tablet Take 7.5 mg by mouth. 10/20/14  Yes Historical Provider, MD  Omega-3 Fatty Acids (FISH OIL) 1000 MG CAPS Take 1,000 mg by mouth.   Yes Historical Provider, MD  sertraline (ZOLOFT) 100 MG tablet Take 1.5 tablets (150 mg total) by mouth daily. 05/19/15 05/18/16 Yes Himabindu Ravi, MD  Turmeric Curcumin 500 MG CAPS Take 500 mg by mouth.   Yes Historical Provider, MD  amLODipine (NORVASC) 5 MG tablet Take 5 mg by mouth. 10/20/14 10/20/15  Historical Provider, MD  omeprazole (PRILOSEC) 40 MG capsule Take 40 mg by mouth. 10/20/14 10/20/15  Historical Provider, MD    Family History Family History  Problem Relation Age of Onset  . Hypertension Mother   . Depression Mother   . Heart failure Father   . Obesity Brother   . Heart disease Brother   . Stroke Brother   . Depression Brother   . Breast cancer Cousin     2 mat and 1 pat cousins    Social History Social History  Substance Use Topics  . Smoking status: Former Smoker    Quit date: 05/18/1965  . Smokeless tobacco: Never Used  .  Alcohol use 2.4 oz/week    4 Glasses of wine per week     Allergies   Sulfamethoxazole; Statins; and Sulfa antibiotics   Review of Systems Review of Systems  All other systems reviewed and are negative.    Physical Exam Triage Vital Signs ED Triage Vitals  Enc Vitals Group     BP 11/08/15 1655 (!) 148/83     Pulse Rate 11/08/15 1655 95     Resp 11/08/15 1655 18     Temp 11/08/15 1655 98.3 F (36.8 C)     Temp Source 11/08/15 1655 Oral     SpO2 11/08/15 1655 95 %     Pain Score 11/08/15 1702 1     Updated Vital Signs BP (!) 148/83 (BP Location: Left Arm)   Pulse 95   Temp 98.3 F (36.8 C) (Oral)   Resp 18   SpO2 95%  Physical Exam  Constitutional: She is oriented to  person, place, and time. No distress.  Alert, nicely groomed  HENT:  Head: Atraumatic.  Voice sounds quite congested. Severe nasal congestion bilaterally Throat is a little red with postnasal drainage evident  Eyes:  Conjugate gaze, no eye redness/drainage  Neck: Neck supple.  Cardiovascular: Normal rate and regular rhythm.   Pulmonary/Chest: No respiratory distress. She has no wheezes. She has no rales.  Lungs clear, symmetric breath sounds  Abdominal: She exhibits no distension.  Musculoskeletal: Normal range of motion.  No leg swelling  Neurological: She is alert and oriented to person, place, and time.  Skin: Skin is warm and dry.  No cyanosis  Nursing note and vitals reviewed.    UC Treatments / Results   Procedures Procedures (including critical care time) None    Final Clinical Impressions(s) / UC Diagnoses   Final diagnoses:  URI (upper respiratory infection)   No acute indication for antibiotics but patient quite concerned about possibility of worsening symptoms and is elderly. Will give zpack to hold and start taking if fever >100.5, increasing phlegm, or persistence of symptoms >10d occurs.  Recheck or followup with PCP/Vickie Vickki Muff for further evaluation as needed.     New Prescriptions Discharge Medication List as of 11/08/2015  5:24 PM    START taking these medications   Details  azithromycin (ZITHROMAX) 250 MG tablet Take 1 tablet (250 mg total) by mouth daily. Take first 2 tablets together, then 1 every day until finished., Starting Mon 11/08/2015, Normal         Sherlene Shams, MD 11/09/15 2217

## 2015-11-08 NOTE — Discharge Instructions (Signed)
Zpack sent to the [facility] in [place].  Your symptoms are probably viral.  Start taking the Zpack if you have a new fever >100.5, increasing phlegm production, or if not starting to improve in a few days.

## 2015-11-16 DIAGNOSIS — F419 Anxiety disorder, unspecified: Secondary | ICD-10-CM | POA: Diagnosis not present

## 2015-11-16 DIAGNOSIS — R938 Abnormal findings on diagnostic imaging of other specified body structures: Secondary | ICD-10-CM | POA: Diagnosis not present

## 2015-11-16 DIAGNOSIS — I10 Essential (primary) hypertension: Secondary | ICD-10-CM | POA: Diagnosis not present

## 2015-11-16 DIAGNOSIS — R05 Cough: Secondary | ICD-10-CM | POA: Diagnosis not present

## 2015-11-16 DIAGNOSIS — R918 Other nonspecific abnormal finding of lung field: Secondary | ICD-10-CM | POA: Diagnosis not present

## 2015-11-16 DIAGNOSIS — G4700 Insomnia, unspecified: Secondary | ICD-10-CM | POA: Diagnosis not present

## 2015-11-16 DIAGNOSIS — E785 Hyperlipidemia, unspecified: Secondary | ICD-10-CM | POA: Diagnosis not present

## 2015-11-22 DIAGNOSIS — R05 Cough: Secondary | ICD-10-CM | POA: Diagnosis not present

## 2015-11-22 DIAGNOSIS — M47814 Spondylosis without myelopathy or radiculopathy, thoracic region: Secondary | ICD-10-CM | POA: Diagnosis not present

## 2015-11-22 DIAGNOSIS — R938 Abnormal findings on diagnostic imaging of other specified body structures: Secondary | ICD-10-CM | POA: Diagnosis not present

## 2015-11-30 DIAGNOSIS — C44519 Basal cell carcinoma of skin of other part of trunk: Secondary | ICD-10-CM | POA: Diagnosis not present

## 2015-11-30 DIAGNOSIS — Z789 Other specified health status: Secondary | ICD-10-CM | POA: Diagnosis not present

## 2015-11-30 DIAGNOSIS — Z1283 Encounter for screening for malignant neoplasm of skin: Secondary | ICD-10-CM | POA: Diagnosis not present

## 2015-11-30 DIAGNOSIS — Z872 Personal history of diseases of the skin and subcutaneous tissue: Secondary | ICD-10-CM | POA: Diagnosis not present

## 2015-11-30 DIAGNOSIS — D485 Neoplasm of uncertain behavior of skin: Secondary | ICD-10-CM | POA: Diagnosis not present

## 2015-11-30 DIAGNOSIS — B372 Candidiasis of skin and nail: Secondary | ICD-10-CM | POA: Diagnosis not present

## 2015-11-30 DIAGNOSIS — B078 Other viral warts: Secondary | ICD-10-CM | POA: Diagnosis not present

## 2015-12-01 ENCOUNTER — Other Ambulatory Visit: Payer: Self-pay | Admitting: Family Medicine

## 2015-12-03 ENCOUNTER — Other Ambulatory Visit: Payer: Self-pay | Admitting: Family Medicine

## 2015-12-06 ENCOUNTER — Other Ambulatory Visit: Payer: Self-pay | Admitting: Family Medicine

## 2015-12-06 DIAGNOSIS — R9389 Abnormal findings on diagnostic imaging of other specified body structures: Secondary | ICD-10-CM

## 2015-12-14 ENCOUNTER — Ambulatory Visit
Admission: RE | Admit: 2015-12-14 | Discharge: 2015-12-14 | Disposition: A | Discharge status: Home / Self Care | Payer: PPO | Source: Ambulatory Visit | Attending: Family Medicine | Admitting: Family Medicine

## 2015-12-14 DIAGNOSIS — E041 Nontoxic single thyroid nodule: Secondary | ICD-10-CM | POA: Diagnosis not present

## 2015-12-14 DIAGNOSIS — D7389 Other diseases of spleen: Secondary | ICD-10-CM | POA: Insufficient documentation

## 2015-12-14 DIAGNOSIS — M654 Radial styloid tenosynovitis [de Quervain]: Secondary | ICD-10-CM | POA: Diagnosis not present

## 2015-12-14 DIAGNOSIS — J8410 Pulmonary fibrosis, unspecified: Secondary | ICD-10-CM | POA: Insufficient documentation

## 2015-12-14 DIAGNOSIS — R918 Other nonspecific abnormal finding of lung field: Secondary | ICD-10-CM | POA: Diagnosis not present

## 2015-12-14 DIAGNOSIS — I2510 Atherosclerotic heart disease of native coronary artery without angina pectoris: Secondary | ICD-10-CM | POA: Diagnosis not present

## 2015-12-14 DIAGNOSIS — G5601 Carpal tunnel syndrome, right upper limb: Secondary | ICD-10-CM | POA: Diagnosis not present

## 2015-12-14 DIAGNOSIS — S52691D Other fracture of lower end of right ulna, subsequent encounter for closed fracture with routine healing: Secondary | ICD-10-CM | POA: Diagnosis not present

## 2015-12-14 DIAGNOSIS — R938 Abnormal findings on diagnostic imaging of other specified body structures: Secondary | ICD-10-CM | POA: Diagnosis not present

## 2015-12-14 DIAGNOSIS — R9389 Abnormal findings on diagnostic imaging of other specified body structures: Secondary | ICD-10-CM

## 2015-12-14 MED ORDER — IOPAMIDOL (ISOVUE-300) INJECTION 61%
75.0000 mL | Freq: Once | INTRAVENOUS | Status: AC | PRN
  Administered 2015-12-14: 60 mL via INTRAVENOUS

## 2015-12-16 ENCOUNTER — Ambulatory Visit: Payer: PPO

## 2015-12-17 ENCOUNTER — Ambulatory Visit: Admission: RE | Admit: 2015-12-17 | Payer: PPO | Source: Ambulatory Visit

## 2015-12-20 DIAGNOSIS — C44519 Basal cell carcinoma of skin of other part of trunk: Secondary | ICD-10-CM | POA: Diagnosis not present

## 2015-12-24 ENCOUNTER — Other Ambulatory Visit: Payer: Self-pay | Admitting: Family Medicine

## 2015-12-24 DIAGNOSIS — E041 Nontoxic single thyroid nodule: Secondary | ICD-10-CM

## 2016-01-04 ENCOUNTER — Ambulatory Visit
Admission: RE | Admit: 2016-01-04 | Discharge: 2016-01-04 | Disposition: A | Discharge status: Home / Self Care | Payer: PPO | Source: Ambulatory Visit | Attending: Family Medicine | Admitting: Family Medicine

## 2016-01-04 DIAGNOSIS — E041 Nontoxic single thyroid nodule: Secondary | ICD-10-CM | POA: Diagnosis not present

## 2016-01-04 DIAGNOSIS — E042 Nontoxic multinodular goiter: Secondary | ICD-10-CM | POA: Diagnosis not present

## 2016-01-13 DIAGNOSIS — F331 Major depressive disorder, recurrent, moderate: Secondary | ICD-10-CM | POA: Diagnosis not present

## 2016-02-10 DIAGNOSIS — F331 Major depressive disorder, recurrent, moderate: Secondary | ICD-10-CM | POA: Diagnosis not present

## 2016-02-15 DIAGNOSIS — E785 Hyperlipidemia, unspecified: Secondary | ICD-10-CM | POA: Diagnosis not present

## 2016-02-15 DIAGNOSIS — F419 Anxiety disorder, unspecified: Secondary | ICD-10-CM | POA: Diagnosis not present

## 2016-02-15 DIAGNOSIS — E041 Nontoxic single thyroid nodule: Secondary | ICD-10-CM | POA: Diagnosis not present

## 2016-02-15 DIAGNOSIS — E559 Vitamin D deficiency, unspecified: Secondary | ICD-10-CM | POA: Diagnosis not present

## 2016-02-15 DIAGNOSIS — M1990 Unspecified osteoarthritis, unspecified site: Secondary | ICD-10-CM | POA: Diagnosis not present

## 2016-02-15 DIAGNOSIS — I10 Essential (primary) hypertension: Secondary | ICD-10-CM | POA: Diagnosis not present

## 2016-02-15 DIAGNOSIS — K219 Gastro-esophageal reflux disease without esophagitis: Secondary | ICD-10-CM | POA: Diagnosis not present

## 2016-03-05 ENCOUNTER — Ambulatory Visit
Admission: EM | Admit: 2016-03-05 | Discharge: 2016-03-05 | Disposition: A | Discharge status: Home / Self Care | Payer: PPO | Attending: Family Medicine | Admitting: Family Medicine

## 2016-03-05 DIAGNOSIS — R059 Cough, unspecified: Secondary | ICD-10-CM

## 2016-03-05 DIAGNOSIS — H6691 Otitis media, unspecified, right ear: Secondary | ICD-10-CM

## 2016-03-05 DIAGNOSIS — R05 Cough: Secondary | ICD-10-CM | POA: Diagnosis not present

## 2016-03-05 MED ORDER — FEXOFENADINE-PSEUDOEPHED ER 180-240 MG PO TB24: 1.0000 | ORAL_TABLET | Freq: Every day | ORAL | 0 refills | Status: DC

## 2016-03-05 MED ORDER — AMOXICILLIN-POT CLAVULANATE 875-125 MG PO TABS: 1.0000 | ORAL_TABLET | Freq: Two times a day (BID) | ORAL | 0 refills | Status: DC

## 2016-03-05 MED ORDER — HYDROCOD POLST-CPM POLST ER 10-8 MG/5ML PO SUER: 5.0000 mL | Freq: Two times a day (BID) | ORAL | 0 refills | Status: DC | PRN

## 2016-03-05 NOTE — ED Provider Notes (Signed)
[facility]    CSN: [id number] Arrival date & time: 03/05/16  0813     History   Chief Complaint Chief Complaint  Patient presents with  . Cough    HPI Allison Hall is a 75 y.o. female.   Patient is a 75 year old white female history of nasal congestion sore throat started on Wednesday night spell Sunday she feels things are getting worse and came in to be seen and evaluated. Course ears are bothering her she's course and coughing through the night. She is worried course this illness is progressing. She stop smoking over 30 years ago she is a history of anxiety: Polyps GERD hyperlipidemia osteoarthritis and skin cancer. She is allergic to multiple things and has expressed to me that she does not want to be on Cipro also allergic to Septra and sulfa medications she's had abdominal hysterectomy appendectomy no surgery and total knee replacement was history hypertension depression heart failure obesity heart disease and stroke in the family   The history is provided by the patient. No language interpreter was used.  Cough  Cough characteristics:  Non-productive Sputum characteristics:  Nondescript Severity:  Moderate Onset quality:  Sudden Timing:  Constant Progression:  Worsening Chronicity:  New Context: upper respiratory infection   Relieved by:  Nothing Worsened by:  Nothing Ineffective treatments:  None tried Associated symptoms: ear pain and sore throat     Past Medical History:  Diagnosis Date  . Anxiety   . Cancer ([place])    skin ca  . Colon polyp   . GERD (gastroesophageal reflux disease)   . Hypercholesteremia   . Hypertension   . Osteoarthritis     Patient Active Problem List   Diagnosis Date Noted  . Closed fracture of distal end of ulna 06/01/2015  . Triggering of digit 05/27/2014  . Hamstring muscle strain 05/26/2014  . Arthritis of knee, degenerative 03/26/2014  . Long term current use of opiate analgesic 04/21/2013  .  Arthritis, degenerative 04/21/2013  . Anxiety 10/05/2011  . Colon polyp 10/05/2011  . Esophageal foreign body 10/05/2011  . Acid reflux 10/05/2011  . BP (high blood pressure) 10/05/2011  . Hypercholesterolemia 10/05/2011    Past Surgical History:  Procedure Laterality Date  . ABDOMINAL HYSTERECTOMY    . APPENDECTOMY    . NOSE SURGERY    . REPLACEMENT TOTAL KNEE      OB History    No data available       Home Medications    Prior to Admission medications   Medication Sig Start Date End Date Taking? Authorizing Provider  acetaminophen (TYLENOL) 325 MG tablet Take 500 mg by mouth.    Historical Provider, MD  amLODipine (NORVASC) 5 MG tablet Take 5 mg by mouth. 10/20/14 10/20/15  Historical Provider, MD  amoxicillin-clavulanate (AUGMENTIN) 875-125 MG tablet Take 1 tablet by mouth 2 (two) times daily. 03/05/16   Frederich Cha, MD  azithromycin (ZITHROMAX) 250 MG tablet Take 1 tablet (250 mg total) by mouth daily. Take first 2 tablets together, then 1 every day until finished. 11/08/15   Sherlene Shams, MD  buPROPion [facility] SR) 100 MG 12 hr tablet Take 1 tablet (100 mg total) by mouth daily. 06/07/15 06/06/16  Himabindu Ravi, MD  chlorpheniramine-HYDROcodone (TUSSIONEX PENNKINETIC ER) 10-8 MG/5ML SUER Take 5 mLs by mouth every 12 (twelve) hours as needed for cough. 03/05/16   Frederich Cha, MD  Cholecalciferol (VITAMIN D-1000 MAX ST) 1000 units tablet Take 1,000 Units by mouth.  Historical Provider, MD  fexofenadine-pseudoephedrine (ALLEGRA-D ALLERGY & CONGESTION) 180-240 MG 24 hr tablet Take 1 tablet by mouth daily. 03/05/16   Frederich Cha, MD  lisinopril (PRINIVIL,ZESTRIL) 20 MG tablet Take 20 mg by mouth. 04/29/15 02/14/16  Historical Provider, MD  Magnesium 250 MG TABS Take 2 tablets by mouth daily.    Historical Provider, MD  meloxicam (MOBIC) 7.5 MG tablet Take 7.5 mg by mouth. 10/20/14   Historical Provider, MD  Omega-3 Fatty Acids (FISH OIL) 1000 MG CAPS Take 1,000 mg by mouth.     Historical Provider, MD  omeprazole (PRILOSEC) 40 MG capsule Take 20 mg by mouth.  10/20/14 10/20/15  Historical Provider, MD  sertraline (ZOLOFT) 100 MG tablet Take 1.5 tablets (150 mg total) by mouth daily. 05/19/15 05/18/16  Himabindu Ravi, MD  Turmeric Curcumin 500 MG CAPS Take 500 mg by mouth.    Historical Provider, MD    Family History Family History  Problem Relation Age of Onset  . Hypertension Mother   . Depression Mother   . Heart failure Father   . Obesity Brother   . Heart disease Brother   . Stroke Brother   . Depression Brother   . Breast cancer Cousin     2 mat and 1 pat cousins    Social History Social History  Substance Use Topics  . Smoking status: Former Smoker    Quit date: 05/18/1965  . Smokeless tobacco: Never Used  . Alcohol use 2.4 oz/week    4 Glasses of wine per week     Allergies   Sulfamethoxazole and Sulfa antibiotics   Review of Systems Review of Systems  HENT: Positive for ear pain, sinus pain, sinus pressure and sore throat.   Respiratory: Positive for cough.   All other systems reviewed and are negative.    Physical Exam Triage Vital Signs ED Triage Vitals  Enc Vitals Group     BP 03/05/16 0836 (!) 151/75     Pulse Rate 03/05/16 0836 95     Resp 03/05/16 0836 18     Temp 03/05/16 0836 98.8 F (37.1 C)     Temp Source 03/05/16 0836 Oral     SpO2 03/05/16 0836 98 %     Weight 03/05/16 0837 200 lb (90.7 kg)     Height 03/05/16 0837 5\' 5"  (1.651 m)     Head Circumference --      Peak Flow --      Pain Score 03/05/16 0840 1     Pain Loc --      Pain Edu? --      Excl. in [place]? --    No data found.   Updated Vital Signs BP (!) 151/75 (BP Location: Left Arm)   Pulse 95   Temp 98.8 F (37.1 C) (Oral)   Resp 18   Ht 5\' 5"  (1.651 m)   Wt 200 lb (90.7 kg)   SpO2 98%   BMI 33.28 kg/m   Visual Acuity Right Eye Distance:   Left Eye Distance:   Bilateral Distance:    Right Eye Near:   Left Eye Near:    Bilateral Near:       Physical Exam  Constitutional: She is oriented to person, place, and time. She appears well-developed and well-nourished.  HENT:  Head: Normocephalic and atraumatic.  Right Ear: Hearing, external ear and ear canal normal. Tympanic membrane is erythematous and bulging.  Left Ear: Hearing, external ear and ear canal normal. Tympanic membrane is bulging.  Nose: Mucosal edema and rhinorrhea present.  Mouth/Throat: Uvula is midline. Posterior oropharyngeal edema and posterior oropharyngeal erythema present.  Eyes: Pupils are equal, round, and reactive to light.  Neck: Normal range of motion. Neck supple.  Cardiovascular: Normal rate and regular rhythm.   Pulmonary/Chest: Effort normal.  Musculoskeletal: Normal range of motion. She exhibits no edema or deformity.  Neurological: She is alert and oriented to person, place, and time.  Skin: Skin is warm and dry.  Psychiatric: She has a normal mood and affect.  Vitals reviewed.    UC Treatments / Results  Labs (all labs ordered are listed, but only abnormal results are displayed) Labs Reviewed - No data to display  EKG  EKG Interpretation None       Radiology No results found.  Procedures Procedures (including critical care time)  Medications Ordered in UC Medications - No data to display   Initial Impression / Assessment and Plan / UC Course  I have reviewed the triage vital signs and the nursing notes.  Pertinent labs & imaging results that were available during my care of the patient were reviewed by me and considered in my medical decision making (see chart for details).  Clinical Course     We'll treat patient for otitis media we'll place Augmentin 1 tablet twice a day Allegra-D one tablet twice day discussed with her what she needed for her cough she feels the cough is bad enough she needs something strong she has been using a cough medicine codeine which is not an effective will switch her to test next 1 teaspoon  twice a day follow-up for PCP as needed.   Final Clinical Impressions(s) / UC Diagnoses   Final diagnoses:  Cough  Right otitis media, unspecified otitis media type    New Prescriptions Discharge Medication List as of 03/05/2016  9:03 AM    START taking these medications   Details  amoxicillin-clavulanate (AUGMENTIN) 875-125 MG tablet Take 1 tablet by mouth 2 (two) times daily., Starting Sun 03/05/2016, Normal    chlorpheniramine-HYDROcodone (TUSSIONEX PENNKINETIC ER) 10-8 MG/5ML SUER Take 5 mLs by mouth every 12 (twelve) hours as needed for cough., Starting Sun 03/05/2016, Normal    fexofenadine-pseudoephedrine (ALLEGRA-D ALLERGY & CONGESTION) 180-240 MG 24 hr tablet Take 1 tablet by mouth daily., Starting Sun 03/05/2016, Normal         Note: This dictation was prepared with Dragon dictation along with smaller phrase technology. Any transcriptional errors that result from this process are unintentional.   Frederich Cha, MD 03/05/16 [phone]

## 2016-03-05 NOTE — ED Triage Notes (Signed)
Pt reports starting Thursday with cough and congestion and sore throat with swallowing 1/10. Ears hurt with nose blowing. No fever.

## 2016-03-08 ENCOUNTER — Telehealth: Payer: Self-pay

## 2016-03-08 DIAGNOSIS — E559 Vitamin D deficiency, unspecified: Secondary | ICD-10-CM | POA: Diagnosis not present

## 2016-03-08 DIAGNOSIS — E785 Hyperlipidemia, unspecified: Secondary | ICD-10-CM | POA: Diagnosis not present

## 2016-03-08 DIAGNOSIS — E041 Nontoxic single thyroid nodule: Secondary | ICD-10-CM | POA: Diagnosis not present

## 2016-03-08 DIAGNOSIS — I10 Essential (primary) hypertension: Secondary | ICD-10-CM | POA: Diagnosis not present

## 2016-03-08 NOTE — Telephone Encounter (Signed)
Courtesy call back completed today after patient's visit at [facility]. Patient improved and will call back with any questions or concerns.  

## 2016-03-27 DIAGNOSIS — Z923 Personal history of irradiation: Secondary | ICD-10-CM

## 2016-03-27 HISTORY — DX: Personal history of irradiation: Z92.3

## 2016-03-31 DIAGNOSIS — E041 Nontoxic single thyroid nodule: Secondary | ICD-10-CM | POA: Diagnosis not present

## 2016-03-31 DIAGNOSIS — Z87891 Personal history of nicotine dependence: Secondary | ICD-10-CM | POA: Diagnosis not present

## 2016-03-31 DIAGNOSIS — K219 Gastro-esophageal reflux disease without esophagitis: Secondary | ICD-10-CM | POA: Diagnosis not present

## 2016-04-05 DIAGNOSIS — F331 Major depressive disorder, recurrent, moderate: Secondary | ICD-10-CM | POA: Diagnosis not present

## 2016-04-17 DIAGNOSIS — E559 Vitamin D deficiency, unspecified: Secondary | ICD-10-CM | POA: Diagnosis not present

## 2016-04-17 DIAGNOSIS — E782 Mixed hyperlipidemia: Secondary | ICD-10-CM | POA: Diagnosis not present

## 2016-04-17 DIAGNOSIS — N183 Chronic kidney disease, stage 3 (moderate): Secondary | ICD-10-CM | POA: Diagnosis not present

## 2016-04-17 DIAGNOSIS — E041 Nontoxic single thyroid nodule: Secondary | ICD-10-CM

## 2016-04-17 DIAGNOSIS — F411 Generalized anxiety disorder: Secondary | ICD-10-CM | POA: Diagnosis not present

## 2016-04-17 DIAGNOSIS — E785 Hyperlipidemia, unspecified: Secondary | ICD-10-CM | POA: Diagnosis not present

## 2016-04-17 DIAGNOSIS — I10 Essential (primary) hypertension: Secondary | ICD-10-CM | POA: Diagnosis not present

## 2016-04-17 HISTORY — DX: Nontoxic single thyroid nodule: E04.1

## 2016-05-03 DIAGNOSIS — F331 Major depressive disorder, recurrent, moderate: Secondary | ICD-10-CM | POA: Diagnosis not present

## 2016-05-17 DIAGNOSIS — Z96651 Presence of right artificial knee joint: Secondary | ICD-10-CM | POA: Diagnosis not present

## 2016-05-31 DIAGNOSIS — F331 Major depressive disorder, recurrent, moderate: Secondary | ICD-10-CM | POA: Diagnosis not present

## 2016-06-06 ENCOUNTER — Other Ambulatory Visit: Payer: Self-pay | Admitting: Family Medicine

## 2016-06-06 DIAGNOSIS — Z1231 Encounter for screening mammogram for malignant neoplasm of breast: Secondary | ICD-10-CM

## 2016-07-05 DIAGNOSIS — F331 Major depressive disorder, recurrent, moderate: Secondary | ICD-10-CM | POA: Diagnosis not present

## 2016-07-10 DIAGNOSIS — N183 Chronic kidney disease, stage 3 (moderate): Secondary | ICD-10-CM | POA: Diagnosis not present

## 2016-07-10 DIAGNOSIS — E785 Hyperlipidemia, unspecified: Secondary | ICD-10-CM | POA: Diagnosis not present

## 2016-07-10 DIAGNOSIS — E559 Vitamin D deficiency, unspecified: Secondary | ICD-10-CM | POA: Diagnosis not present

## 2016-07-17 DIAGNOSIS — E559 Vitamin D deficiency, unspecified: Secondary | ICD-10-CM | POA: Diagnosis not present

## 2016-07-17 DIAGNOSIS — E785 Hyperlipidemia, unspecified: Secondary | ICD-10-CM | POA: Diagnosis not present

## 2016-07-17 DIAGNOSIS — I10 Essential (primary) hypertension: Secondary | ICD-10-CM | POA: Diagnosis not present

## 2016-07-17 DIAGNOSIS — M1990 Unspecified osteoarthritis, unspecified site: Secondary | ICD-10-CM | POA: Diagnosis not present

## 2016-07-17 DIAGNOSIS — F419 Anxiety disorder, unspecified: Secondary | ICD-10-CM | POA: Diagnosis not present

## 2016-07-17 DIAGNOSIS — N183 Chronic kidney disease, stage 3 (moderate): Secondary | ICD-10-CM | POA: Diagnosis not present

## 2016-07-24 ENCOUNTER — Ambulatory Visit
Admission: RE | Admit: 2016-07-24 | Discharge: 2016-07-24 | Disposition: A | Discharge status: Home / Self Care | Payer: PPO | Source: Ambulatory Visit | Attending: Family Medicine | Admitting: Family Medicine

## 2016-07-24 DIAGNOSIS — Z1231 Encounter for screening mammogram for malignant neoplasm of breast: Secondary | ICD-10-CM | POA: Diagnosis not present

## 2016-07-24 DIAGNOSIS — R928 Other abnormal and inconclusive findings on diagnostic imaging of breast: Secondary | ICD-10-CM | POA: Diagnosis not present

## 2016-07-26 DIAGNOSIS — M1712 Unilateral primary osteoarthritis, left knee: Secondary | ICD-10-CM | POA: Diagnosis not present

## 2016-08-07 ENCOUNTER — Other Ambulatory Visit: Payer: Self-pay | Admitting: Family Medicine

## 2016-08-07 DIAGNOSIS — R928 Other abnormal and inconclusive findings on diagnostic imaging of breast: Secondary | ICD-10-CM | POA: Diagnosis not present

## 2016-08-07 DIAGNOSIS — N6489 Other specified disorders of breast: Secondary | ICD-10-CM | POA: Diagnosis not present

## 2016-08-10 ENCOUNTER — Ambulatory Visit
Admission: RE | Admit: 2016-08-10 | Discharge: 2016-08-10 | Disposition: A | Discharge status: Home / Self Care | Payer: PPO | Source: Ambulatory Visit | Attending: Family Medicine | Admitting: Family Medicine

## 2016-08-10 DIAGNOSIS — N6489 Other specified disorders of breast: Secondary | ICD-10-CM | POA: Diagnosis not present

## 2016-08-10 DIAGNOSIS — R928 Other abnormal and inconclusive findings on diagnostic imaging of breast: Secondary | ICD-10-CM | POA: Insufficient documentation

## 2016-08-10 DIAGNOSIS — N6311 Unspecified lump in the right breast, upper outer quadrant: Secondary | ICD-10-CM | POA: Insufficient documentation

## 2016-08-17 ENCOUNTER — Other Ambulatory Visit: Payer: Self-pay | Admitting: Family Medicine

## 2016-08-17 DIAGNOSIS — R928 Other abnormal and inconclusive findings on diagnostic imaging of breast: Secondary | ICD-10-CM

## 2016-08-17 DIAGNOSIS — N6310 Unspecified lump in the right breast, unspecified quadrant: Secondary | ICD-10-CM

## 2016-08-17 DIAGNOSIS — N6311 Unspecified lump in the right breast, upper outer quadrant: Secondary | ICD-10-CM | POA: Diagnosis not present

## 2016-08-24 ENCOUNTER — Ambulatory Visit
Admission: RE | Admit: 2016-08-24 | Discharge: 2016-08-24 | Disposition: A | Discharge status: Home / Self Care | Payer: PPO | Source: Ambulatory Visit | Attending: Family Medicine | Admitting: Family Medicine

## 2016-08-24 DIAGNOSIS — R928 Other abnormal and inconclusive findings on diagnostic imaging of breast: Secondary | ICD-10-CM

## 2016-08-24 DIAGNOSIS — N6310 Unspecified lump in the right breast, unspecified quadrant: Secondary | ICD-10-CM

## 2016-08-24 DIAGNOSIS — Z170 Estrogen receptor positive status [ER+]: Secondary | ICD-10-CM | POA: Insufficient documentation

## 2016-08-24 DIAGNOSIS — C50411 Malignant neoplasm of upper-outer quadrant of right female breast: Secondary | ICD-10-CM

## 2016-08-24 DIAGNOSIS — C50919 Malignant neoplasm of unspecified site of unspecified female breast: Secondary | ICD-10-CM

## 2016-08-24 DIAGNOSIS — N6311 Unspecified lump in the right breast, upper outer quadrant: Secondary | ICD-10-CM | POA: Diagnosis not present

## 2016-08-24 HISTORY — DX: Malignant neoplasm of upper-outer quadrant of right female breast: C50.411

## 2016-08-24 HISTORY — DX: Malignant neoplasm of unspecified site of unspecified female breast: C50.919

## 2016-08-24 HISTORY — PX: BREAST BIOPSY: SHX20

## 2016-08-29 ENCOUNTER — Other Ambulatory Visit: Payer: Self-pay | Admitting: *Deleted

## 2016-08-29 ENCOUNTER — Encounter: Payer: Self-pay | Admitting: *Deleted

## 2016-08-31 DIAGNOSIS — Z170 Estrogen receptor positive status [ER+]: Secondary | ICD-10-CM | POA: Diagnosis not present

## 2016-08-31 DIAGNOSIS — C50411 Malignant neoplasm of upper-outer quadrant of right female breast: Secondary | ICD-10-CM | POA: Diagnosis not present

## 2016-08-31 NOTE — Progress Notes (Signed)
  Oncology Nurse Navigator Documentation  Navigator Location: [facility] (08/31/16 1100) Referral date to RadOnc/MedOnc: 09/06/16 (08/31/16 1100) )Navigator Encounter Type: Introductory phone call (08/31/16 1100)   Abnormal Finding Date: 08/10/16 (08/31/16 1100) Confirmed Diagnosis Date: 08/28/16 (08/31/16 1100)                   Barriers/Navigation Needs: Education;Coordination of Care (08/31/16 1100) Education: Newly Diagnosed Cancer Education;Understanding Cancer/ Treatment Options (08/31/16 1100) Interventions: Coordination of Care (08/31/16 1100)   Coordination of Care: Appts (08/31/16 1100)                  Time Spent with Patient: 60 (08/31/16 1100)   Patient called and stated she had talked with the radiologist and was told one of the navigators would call her to schedule her surgical consult. She decided to go ahead and call me.  Patient is scheduled to see Dr. Tamala Julian on 08/31/16 @ 9:30, and Dr. Mike Gip on 09/06/16 @ 8:30.  Will either take her educational material to Dr. Thompson Caul office or FedEx it to her.  She is to call if she has any questions or needs.

## 2016-09-05 ENCOUNTER — Encounter: Payer: Self-pay | Admitting: *Deleted

## 2016-09-05 NOTE — Progress Notes (Signed)
  Oncology Nurse Navigator Documentation  Navigator Location: [facility] (09/05/16 0900)   )Navigator Encounter Type: Telephone (09/05/16 0900) Telephone: Lahoma Crocker Call (09/05/16 0900)                                                  Time Spent with Patient: 30 (09/05/16 0900)   Called patient to follow-up after her surgical consult.  She has received her educational literature.  She has questions regarding a 3rd biopsy and how long would it take to get an appointment.  Explained it typically does not take too long to get an appointment. Encouraged her to discuss this with Dr. Mike Gip tomorrow.  She is agreeable.

## 2016-09-06 ENCOUNTER — Inpatient Hospital Stay: Payer: PPO | Attending: Hematology and Oncology | Admitting: Hematology and Oncology

## 2016-09-06 ENCOUNTER — Encounter: Payer: Self-pay | Admitting: Hematology and Oncology

## 2016-09-06 ENCOUNTER — Inpatient Hospital Stay: Payer: PPO

## 2016-09-06 ENCOUNTER — Other Ambulatory Visit: Payer: Self-pay | Admitting: Hematology and Oncology

## 2016-09-06 DIAGNOSIS — Z87891 Personal history of nicotine dependence: Secondary | ICD-10-CM | POA: Diagnosis not present

## 2016-09-06 DIAGNOSIS — Z170 Estrogen receptor positive status [ER+]: Secondary | ICD-10-CM | POA: Diagnosis not present

## 2016-09-06 DIAGNOSIS — I10 Essential (primary) hypertension: Secondary | ICD-10-CM | POA: Diagnosis not present

## 2016-09-06 DIAGNOSIS — K219 Gastro-esophageal reflux disease without esophagitis: Secondary | ICD-10-CM | POA: Insufficient documentation

## 2016-09-06 DIAGNOSIS — E7800 Pure hypercholesterolemia, unspecified: Secondary | ICD-10-CM | POA: Insufficient documentation

## 2016-09-06 DIAGNOSIS — Z90710 Acquired absence of both cervix and uterus: Secondary | ICD-10-CM | POA: Insufficient documentation

## 2016-09-06 DIAGNOSIS — Z79899 Other long term (current) drug therapy: Secondary | ICD-10-CM | POA: Diagnosis not present

## 2016-09-06 DIAGNOSIS — M1990 Unspecified osteoarthritis, unspecified site: Secondary | ICD-10-CM | POA: Insufficient documentation

## 2016-09-06 DIAGNOSIS — F419 Anxiety disorder, unspecified: Secondary | ICD-10-CM | POA: Diagnosis not present

## 2016-09-06 DIAGNOSIS — C50411 Malignant neoplasm of upper-outer quadrant of right female breast: Secondary | ICD-10-CM | POA: Diagnosis not present

## 2016-09-06 LAB — COMPREHENSIVE METABOLIC PANEL
ALT: 15 U/L (ref 14–54)
AST: 21 U/L (ref 15–41)
Albumin: 4.4 g/dL (ref 3.5–5.0)
Alkaline Phosphatase: 64 U/L (ref 38–126)
Anion gap: 10 (ref 5–15)
BUN: 23 mg/dL — ABNORMAL HIGH (ref 6–20)
CO2: 26 mmol/L (ref 22–32)
Calcium: 9.4 mg/dL (ref 8.9–10.3)
Chloride: 102 mmol/L (ref 101–111)
Creatinine, Ser: 1.09 mg/dL — ABNORMAL HIGH (ref 0.44–1.00)
GFR calc Af Amer: 55 mL/min — ABNORMAL LOW (ref >60)
GFR calc non Af Amer: 48 mL/min — ABNORMAL LOW (ref >60)
Glucose, Bld: 102 mg/dL — ABNORMAL HIGH (ref 65–99)
Potassium: 4.1 mmol/L (ref 3.5–5.1)
Sodium: 138 mmol/L (ref 135–145)
Total Bilirubin: 0.5 mg/dL (ref 0.3–1.2)
Total Protein: 7.4 g/dL (ref 6.5–8.1)

## 2016-09-06 LAB — CBC WITH DIFFERENTIAL/PLATELET
Basophils Absolute: 0.1 10*3/uL (ref 0–0.1)
Basophils Relative: 1 %
Eosinophils Absolute: 0.1 10*3/uL (ref 0–0.7)
Eosinophils Relative: 2 %
HCT: 36.1 % (ref 35.0–47.0)
Hemoglobin: 11.9 g/dL — ABNORMAL LOW (ref 12.0–16.0)
Lymphocytes Relative: 34 %
Lymphs Abs: 1.8 10*3/uL (ref 1.0–3.6)
MCH: 28.5 pg (ref 26.0–34.0)
MCHC: 32.9 g/dL (ref 32.0–36.0)
MCV: 86.7 fL (ref 80.0–100.0)
Monocytes Absolute: 0.6 10*3/uL (ref 0.2–0.9)
Monocytes Relative: 12 %
Neutro Abs: 2.7 10*3/uL (ref 1.4–6.5)
Neutrophils Relative %: 51 %
Platelets: 231 10*3/uL (ref 150–440)
RBC: 4.16 MIL/uL (ref 3.80–5.20)
RDW: 15 % — ABNORMAL HIGH (ref 11.5–14.5)
WBC: 5.3 10*3/uL (ref 3.6–11.0)

## 2016-09-06 NOTE — Progress Notes (Signed)
[facility] day:  09/06/2016  Chief Complaint: Allison Hall is a 76 y.o. female with right breast cancer who is referred in consultation for assessment and management.  HPI:  The patient undergoes yearly mammograms.  Bilateral mammogram on 07/21/2015 revealed no evidence of malignancy.  Bilateral screening mammogram on 07/24/2016 revealed a possible area of distortion in the right breast. Diagnostic mammogram on 08/10/2016 revealed 2 suspicious masses in the upper-outer quadrant of the right breast in a broad area of confluent distortion in the upper-outer quadrant of the right breast.  There are at least 3 areas of confluent distortion spanning approximately 6.5 cm of tissue.  Ultrasound on 07/31/2016 revealed  an irregular hypoechoic 1.2 x 1.1 x 1.2 cm at the 9:30 position 7 cm from the nipple.  In addition, there was an irregular 1.9 x 0.8 x 1.2 cm mass at the 10 o'clock position 5 cm from the nipple.  There were multiple small normal-appearing lymph nodes.  She underwent ultrasound guided core needle biopsy on 08/24/2016 of the densities at the 9:30 position in the 10 o'clock position.  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  Tumor was ER positive (> 90%) and PR positive( > 90%) and HER-2/neu 2+ by IHC.  FISH is pending.  She saw Dr. Rochel Brome on 08/31/2016.  Discussions were held regarding mastectomy versus partial mastectomy.  A stereotactic biopsy of the third site was also discussed.  She was presented at tumor board on 09/04/2016.  Discussions were held regarding need for breast MRI given the apparent lobular pathology and probable mastectomy given the large area of tumor involvement.  She underwent menses at age 42. She underwent menopause at  age 52 when she had a hysterectomy (uterus only). She notes more hot flashes and sweats with the weather. She had an IUD for 4 years. She was never on hormone replacement therapy. She was part of the women's health initiative but refused any medication.  Symptomatically, she denies any breast symptoms.  She notes fatigue.  She has left knee issues ("bone on bone").   Past Medical History:  Diagnosis Date  . Anxiety   . Cancer ([place])    skin ca  . Colon polyp   . GERD (gastroesophageal reflux disease)   . Hypercholesteremia   . Hypertension   . Osteoarthritis     Past Surgical History:  Procedure Laterality Date  . ABDOMINAL HYSTERECTOMY    . APPENDECTOMY    . NOSE SURGERY    . REPLACEMENT TOTAL KNEE      Family History  Problem Relation Age of Onset  . Hypertension Mother   . Depression Mother   . Heart failure Father   . Obesity Brother   . Heart disease Brother   . Stroke Brother   . Depression Brother   . Breast cancer Cousin        2 mat and 1 pat cousins    Social History:  reports that she quit smoking about 51 years ago. She has a 10.00 pack-year smoking history. She has never used smokeless tobacco. She reports that she drinks about 2.4 oz of alcohol per week . She reports that she does not use drugs.  She previously smoked 1/2 pack/day x 10 years.  She stopped smoking on 10/11/1978.  She drinks 6 glasses  of wine/week.  She has 3 children (2 sons and 1 daughter).  She is a retired Clinical cytogeneticist for [employer].  She likes to do crossword puzzles.  The patient is accompanied by her daughter, [name], today.  Allergies:  Allergies  Allergen Reactions  . Sulfamethoxazole Other (See Comments)    Anxiety and imsomnia  . Trazodone Other (See Comments)    Pt states she felt "unsteady"   . Sulfa Antibiotics Anxiety    Current Medications: Current Outpatient Prescriptions  Medication Sig Dispense Refill  . acetaminophen (TYLENOL) 325 MG tablet Take 500 mg by mouth.    Marland Kitchen  amLODipine (NORVASC) 5 MG tablet Take 5 mg by mouth.    Marland Kitchen buPROPion (WELLBUTRIN SR) 100 MG 12 hr tablet Take 1 tablet (100 mg total) by mouth daily. 30 tablet 1  . busPIRone (BUSPAR) 10 MG tablet Take 10 mg by mouth 3 (three) times daily.    . Cholecalciferol (VITAMIN D-1000 MAX ST) 1000 units tablet Take 1,000 Units by mouth.    Marland Kitchen lisinopril (PRINIVIL,ZESTRIL) 20 MG tablet Take 20 mg by mouth.    . loperamide (IMODIUM) 2 MG capsule Take by mouth as needed for diarrhea or loose stools.    . Magnesium 250 MG TABS Take 2 tablets by mouth daily.    . Omega-3 Fatty Acids (FISH OIL) 1000 MG CAPS Take 1,000 mg by mouth.    Marland Kitchen omeprazole (PRILOSEC) 40 MG capsule Take 20 mg by mouth.     . rosuvastatin (CRESTOR) 5 MG tablet Take 5 mg by mouth daily.    . sertraline (ZOLOFT) 100 MG tablet Take 1.5 tablets (150 mg total) by mouth daily. 45 tablet 1  . Turmeric Curcumin 500 MG CAPS Take 500 mg by mouth.    Marland Kitchen amoxicillin-clavulanate (AUGMENTIN) 875-125 MG tablet Take 1 tablet by mouth 2 (two) times daily. (Patient not taking: Reported on 09/06/2016) 20 tablet 0  . azithromycin (ZITHROMAX) 250 MG tablet Take 1 tablet (250 mg total) by mouth daily. Take first 2 tablets together, then 1 every day until finished. (Patient not taking: Reported on 09/06/2016) 6 tablet 0  . chlorpheniramine-HYDROcodone (TUSSIONEX PENNKINETIC ER) 10-8 MG/5ML SUER Take 5 mLs by mouth every 12 (twelve) hours as needed for cough. (Patient not taking: Reported on 09/06/2016) 115 mL 0  . fexofenadine-pseudoephedrine (ALLEGRA-D ALLERGY & CONGESTION) 180-240 MG 24 hr tablet Take 1 tablet by mouth daily. (Patient not taking: Reported on 09/06/2016) 30 tablet 0  . meloxicam (MOBIC) 7.5 MG tablet Take 7.5 mg by mouth.     No current facility-administered medications for this visit.     Review of Systems:  GENERAL:  Fatigue.  No fevers, sweats or weight loss. PERFORMANCE STATUS (ECOG):  0 HEENT:  No visual changes, runny nose, sore throat,  mouth sores or tenderness. Lungs: No shortness of breath or cough.  No hemoptysis. Cardiac:  No chest pain, palpitations, orthopnea, or PND. GI:  No nausea, vomiting, diarrhea, constipation, melena or hematochezia. GU:  No urgency, frequency, dysuria, or hematuria. Musculoskeletal:  No back pain.  "Left knee bone on bone".  No muscle tenderness. Extremities:  No pain or swelling. Skin:  No rashes or skin changes. Neuro:  Brief headaches.  Poor balance.  No numbness or weakness, or coordination issues. Endocrine:  No diabetes or thyroid issues.  Some sweats and hot flashes with the weather. Psych:  No mood changes, depression or anxiety. Pain:  No focal pain. Review of systems:  All other systems reviewed and  found to be negative.  Physical Exam: Blood pressure (!) 154/90, pulse 84, temperature 98.1 F (36.7 C), temperature source Tympanic, resp. rate 18, height _0  (1.651 m), weight 201 lb 15.1 oz (91.6 kg). GENERAL:  Well developed, well nourished, woman sitting comfortably in the exam room in no acute distress. MENTAL STATUS:  Alert and oriented to person, place and time. HEAD:  Short brown styled hair.  Normocephalic, atraumatic, face symmetric, no Cushingoid features. EYES:  Glasses.  Green eyes.  Pupils equal round and reactive to light and accomodation.  No conjunctivitis or scleral icterus. ENT:  Oropharynx clear without lesion.  Tongue normal. Mucous membranes moist.  RESPIRATORY:  Clear to auscultation without rales, wheezes or rhonchi. CARDIOVASCULAR:  Regular rate and rhythm without murmur, rub or gallop. BREAST:  Right breast with thickening and slight bruising at the biopsy sites.  No distinct masses, skin changes or nipple discharge.  Left breast with fibrocystic changes in the upper outer quadrant.  No masses, skin changes or nipple discharge.  ABDOMEN:  Soft, non-tender, with active bowel sounds, and no hepatosplenomegaly.  No masses. SKIN:  No rashes, ulcers or  lesions. EXTREMITIES: No edema, no skin discoloration or tenderness.  No palpable cords. LYMPH NODES: No palpable cervical, supraclavicular, axillary or inguinal adenopathy  NEUROLOGICAL: Unremarkable. PSYCH:  Appropriate.   No visits with results within 3 Day(s) from this visit.  Latest known visit with results is:  Hospital Outpatient Visit on 08/24/2016  Component Date Value Ref Range Status  . SURGICAL PATHOLOGY 08/24/2016    Final-Edited                   Value:Surgical Pathology THIS IS AN ADDENDUM REPORT CASE: [id number] PATIENT: [facility] Surgical Pathology Report Addendum  Reason for Addendum #1:  Immunohistochemistry results  SPECIMEN SUBMITTED: A. Breast, right, 9:30 B. Breast, right, 10:00  CLINICAL HISTORY: Masses  PRE-OPERATIVE DIAGNOSIS: IMC, possibly lobular  POST-OPERATIVE DIAGNOSIS: None provided.     DIAGNOSIS: A. BREAST, RIGHT 9:30; ULTRASOUND GUIDED BIOPSY: - INVASIVE MAMMARY CARCINOMA, DIFFUSELY INFILTRATIVE WITH LOBULAR-LIKE FEATURES.  Size of invasive carcinoma: 6 mm in this sample Histologic grade of invasive carcinoma: 2      Glandular/tubular differentiation score: 3      Nuclear pleomorphism score: 2      Mitotic rate score: 1      Total score: 6  Ductal carcinoma in situ: Not identified Lymphovascular invasion: Not identified  B. Breast, right 10:00; ULTRASOUND GUIDED BIOPSY: - INVASIVE MAMMARY CARCINOMA, WITH LOBULAR FEATURES.  Size                          of invasive carcinoma: 8 mm in this sample Histologic grade of invasive carcinoma: 2      Glandular/tubular differentiation score: 3      Nuclear pleomorphism score: 2      Mitotic rate score: 1      Total score: 6  Ductal carcinoma in situ: Not identified Lymphovascular invasion: Not identified  Comment: The definitive grade will be assigned on the excisional specimen. The carcinoma is the two biopsy specimens have identical morphology. ER/PR/HER2:  Immunohistochemistry will be performed on block A1, with reflex to [place]s for HER2 2+. The results will be reported in an addendum. These findings were communicated to Hart in Dr. Janace Hoard office on 08/25/2016. Read back procedure was performed.     GROSS DESCRIPTION:  A. The specimen is received in a formalin-filled container labeled  with the patient's name and right breast 9:30, 7 cm from nipple.  Core pieces: multiple, aggregate 1.6 x 0.4 x 0.1 cm Comments: red yellow lobulated fibrofatty tissue, ma                         rked green  Entirely submitted in cassette(s): 1  Time/Date in fixative: placed in formalin at 1:40 PM on 08/24/2016 cold ischemic time of less than 2 minutes Total fixation time: 6.7 Hours  B. The specimen is received in a formalin-filled container labeled with the patient's name and right breast #2 10:00, 5 cm from nipple.  Core pieces: multiple, aggregate 1.7 x 0.5 x 0.1 cm Comments: yellow to pink lobulated fibrofatty, marked black  Entirely submitted in cassette(s): 1  Time/Date in fixative: placed in formalin at 1:50 PM on 08/24/2016 cold ischemic time of less than 2 minutes Total fixation time: 6.6 hours    Final Diagnosis performed by Quay Burow, MD.  Electronically signed 08/25/2016 2:54:14PM   The electronic signature indicates that the named Attending Pathologist has evaluated the specimen  Technical component performed at [facility], [address] [place], [place] [address] Lab: [phone] Dir: Darrick Penna. Evette Doffing, MD  Professional component perf                         ormed at [facility], [facility], [place], [place], [place] [address] Lab: [phone] Dir: Dellia Nims. Rubinas, MD   Breast Biomarker Reporting Template  BREAST BIOMARKER TESTS Estrogen Receptor (ER) Status: Positive, greater than 90%      Average intensity of staining: Strong Progesterone Receptor (PgR) Status: Positive, greater  than 90%      Average intensity of staining: Strong HER2 (by immunohistochemistry): 2+ equivocal Percentage of cells with uniform intense complete membrane staining: 5% HER2 FISH pending.  METHODS Cold Ischemia and Fixation Times: Meet requirements specified in latest version of the ASCO/CAP guidelines Testing Performed on Block Number(s): A1 Fixative: Formalin Estrogen Receptor:  FDA cleared ([place])                    Primary Antibody:  SP1 Progesterone Receptor: FDA cleared ([place])                   Primary Antibody: 1E2 HER2 (by immunohistochemistry): FDA approved ([place])                             Prima                         ry Antibody: HercepTest  Immunohistochemistry controls worked appropriately.  Immunohistochemistry controls worked appropriately. Slides were prepared by [facility], [place], [place], and interpreted by Quay Burow, MD.     Addendum #1 performed by Quay Burow, MD.  Electronically signed 08/30/2016 4:33:51PM    Technical component performed at [facility], [address], [place], [place] [address] Lab: [phone] Dir: Darrick Penna. Evette Doffing, MD  Professional component performed at [facility], [facility], [place], [place], [place] [address] Lab: [phone] Dir: Dellia Nims. Reuel Derby, MD      Assessment:  Bradleigh Sonnen is a 76 y.o. female with right breast cancer s/p ultrasound guided core needle biopsies on 08/24/2016.  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  Tumor was ER positive (> 90%) and PR positive( > 90%) and HER-2/neu 2+ by IHC.  FISH was negative.  Bilateral screening mammogram on 07/24/2016 revealed a possible area of distortion in the right breast. Diagnostic mammogram on 08/10/2016  revealed 2 suspicious masses in the upper-outer quadrant of the right breast in a broad area of confluent distortion in the upper-outer quadrant of the right breast.  There are at least 3 areas of confluent distortion spanning approximately 6.5 cm of tissue.  Ultrasound on 07/31/2016 revealed  an irregular hypoechoic 1.2 x 1.1 x 1.2 cm at the 9:30 position 7 cm from the nipple.  In addition, there was an irregular 1.9 x 0.8 x 1.2 cm mass at the 10 o'clock position 5 cm from the nipple.  There were multiple small normal-appearing lymph nodes.  Symptomatically, she notes fatigue.  Exam is notable for thickening at the biopsy site and fibrocystic changes in the left breast.  Plan: 1.  Discuss diagnosis, staging, and management of breast cancer.   Discuss apparent large area of involvement with 2 biopsies suggesting lobular carcinoma.  Discuss bilateral breast MRI to fully determine extent of disease and ensure there is no left-sided breast cancer (lobular breast cancer often difficult to detect on mammogram).  Discussed possible need for mastectomy.  Discuss screening labs.  Discuss coordination of care with surgery.  Several questions asked and answered. 2.  Labs today:  CBC with diff, CMP, CA27.29. 3.  Breast MRI. 4.  RTC after breast MRI.   Lequita Asal, MD  09/06/2016, 9:20 AM

## 2016-09-06 NOTE — Progress Notes (Signed)
Patient here today as new evaluation regarding invasive mammory breast cancer.  Referred by Tanya Nones, Nurse Navigator.

## 2016-09-07 LAB — CANCER ANTIGEN 27.29: CA 27.29: 13.4 U/mL (ref 0.0–38.6)

## 2016-09-08 ENCOUNTER — Encounter: Payer: Self-pay | Admitting: Oncology

## 2016-09-08 LAB — SURGICAL PATHOLOGY

## 2016-09-11 ENCOUNTER — Encounter (HOSPITAL_COMMUNITY): Payer: Self-pay

## 2016-09-11 ENCOUNTER — Ambulatory Visit (HOSPITAL_COMMUNITY)
Admission: RE | Admit: 2016-09-11 | Discharge: 2016-09-11 | Disposition: A | Discharge status: Home / Self Care | Payer: PPO | Source: Ambulatory Visit | Attending: Hematology and Oncology | Admitting: Hematology and Oncology

## 2016-09-11 DIAGNOSIS — C50411 Malignant neoplasm of upper-outer quadrant of right female breast: Secondary | ICD-10-CM

## 2016-09-11 DIAGNOSIS — Z170 Estrogen receptor positive status [ER+]: Secondary | ICD-10-CM | POA: Insufficient documentation

## 2016-09-11 MED ORDER — GADOBENATE DIMEGLUMINE 529 MG/ML IV SOLN
20.0000 mL | Freq: Once | INTRAVENOUS | Status: AC | PRN
  Administered 2016-09-11: 19 mL via INTRAVENOUS

## 2016-09-13 ENCOUNTER — Ambulatory Visit: Payer: PPO | Admitting: Hematology and Oncology

## 2016-09-13 DIAGNOSIS — F331 Major depressive disorder, recurrent, moderate: Secondary | ICD-10-CM | POA: Diagnosis not present

## 2016-09-14 DIAGNOSIS — N183 Chronic kidney disease, stage 3 (moderate): Secondary | ICD-10-CM | POA: Diagnosis not present

## 2016-09-14 DIAGNOSIS — C50911 Malignant neoplasm of unspecified site of right female breast: Secondary | ICD-10-CM | POA: Diagnosis not present

## 2016-09-14 DIAGNOSIS — I10 Essential (primary) hypertension: Secondary | ICD-10-CM | POA: Diagnosis not present

## 2016-09-16 ENCOUNTER — Ambulatory Visit (HOSPITAL_COMMUNITY)
Admission: RE | Admit: 2016-09-16 | Discharge: 2016-09-16 | Disposition: A | Discharge status: Home / Self Care | Payer: PPO | Source: Ambulatory Visit | Attending: Hematology and Oncology | Admitting: Hematology and Oncology

## 2016-09-16 DIAGNOSIS — Z170 Estrogen receptor positive status [ER+]: Secondary | ICD-10-CM | POA: Insufficient documentation

## 2016-09-16 DIAGNOSIS — N6311 Unspecified lump in the right breast, upper outer quadrant: Secondary | ICD-10-CM | POA: Diagnosis not present

## 2016-09-16 DIAGNOSIS — C50411 Malignant neoplasm of upper-outer quadrant of right female breast: Secondary | ICD-10-CM | POA: Insufficient documentation

## 2016-09-16 MED ORDER — GADOBENATE DIMEGLUMINE 529 MG/ML IV SOLN
20.0000 mL | Freq: Once | INTRAVENOUS | Status: AC | PRN
  Administered 2016-09-16: 20 mL via INTRAVENOUS

## 2016-09-18 ENCOUNTER — Encounter: Payer: Self-pay | Admitting: Oncology

## 2016-09-18 ENCOUNTER — Other Ambulatory Visit: Payer: Self-pay | Admitting: Surgery

## 2016-09-18 ENCOUNTER — Telehealth: Payer: Self-pay | Admitting: *Deleted

## 2016-09-18 ENCOUNTER — Inpatient Hospital Stay (HOSPITAL_BASED_OUTPATIENT_CLINIC_OR_DEPARTMENT_OTHER): Payer: PPO | Admitting: Oncology

## 2016-09-18 VITALS — BP 149/88 | HR 82 | Temp 97.9°F | Resp 18 | Wt 201.7 lb

## 2016-09-18 DIAGNOSIS — Z170 Estrogen receptor positive status [ER+]: Principal | ICD-10-CM

## 2016-09-18 DIAGNOSIS — C50411 Malignant neoplasm of upper-outer quadrant of right female breast: Secondary | ICD-10-CM

## 2016-09-18 NOTE — Progress Notes (Signed)
Hematology/Oncology Consult note 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  Bilateral mammogram on 07/21/2015 revealed no evidence of malignancy.  Bilateral screening mammogram on 07/24/2016 revealed a possible area of distortion in the right breast. Diagnostic mammogram on 08/10/2016 revealed 2 suspicious masses in the upper-outer quadrant of the right breast in a broad area of confluent distortion in the upper-outer quadrant of the right breast.  There are at least 3 areas of confluent distortion spanning approximately 6.5 cm of tissue.  Ultrasound on 07/31/2016 revealed  an irregular hypoechoic 1.2 x 1.1 x 1.2 cm at the 9:30 position 7 cm from the nipple.  In addition, there was an irregular 1.9 x 0.8 x 1.2 cm mass at the 10 o'clock position 5 cm from the nipple.  There were multiple small normal-appearing lymph nodes.  She underwent ultrasound guided core needle biopsy on 08/24/2016 of the densities at the 9:30 position in the 10 o'clock position.  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  Tumor was ER positive (> 90%) and PR positive( > 90%) and HER-2/neu 2+ by IHC.  FISH is pending.  She saw Dr. Rochel Brome on 08/31/2016.  Discussions were held regarding mastectomy versus partial mastectomy.  A stereotactic biopsy of the third site was also  discussed.  She was presented at tumor board on 09/04/2016.  Discussions were held regarding need for breast MRI given the apparent lobular pathology and probable mastectomy given the large area of tumor involvement.  She underwent menses at age 76. She underwent menopause at age 76 when she had a hysterectomy (uterus only). She notes more hot flashes and sweats with the weather. She had an IUD for 4 years. She was never on hormone replacement therapy. She was part of the women's health initiative but refused any medication.   Interval history- doing well. Has chronic knee pain. Denies other complaints   Review of systems- Review of Systems  Constitutional: Negative for chills, fever, malaise/fatigue and weight loss.  HENT: Negative for congestion, ear discharge and nosebleeds.   Eyes: Negative for blurred vision.  Respiratory: Negative for cough, hemoptysis, sputum production, shortness of breath and wheezing.   Cardiovascular: Negative for chest pain, palpitations, orthopnea and claudication.  Gastrointestinal: Negative for abdominal pain, blood in stool, constipation, diarrhea, heartburn, melena, nausea and vomiting.  Genitourinary: Negative for dysuria, flank pain, frequency, hematuria and urgency.  Musculoskeletal: Negative for back pain, joint pain and myalgias.  Skin: Negative for rash.  Neurological: Negative for dizziness, tingling, focal weakness, seizures, weakness and headaches.  Endo/Heme/Allergies: Does not bruise/bleed easily.  Psychiatric/Behavioral: Negative for depression and suicidal ideas. The patient does not have insomnia.        Allergies  Allergen Reactions  . Sulfamethoxazole Other (See Comments)    Anxiety and imsomnia  . Trazodone Other (See Comments)    Pt states she felt "unsteady"   . Sulfa Antibiotics Anxiety     Past  Medical History:  Diagnosis Date  . Anxiety   . Cancer ([place])    skin ca  . Colon polyp   . GERD (gastroesophageal reflux  disease)   . Hypercholesteremia   . Hypertension   . Osteoarthritis      Past Surgical History:  Procedure Laterality Date  . ABDOMINAL HYSTERECTOMY    . APPENDECTOMY    . NOSE SURGERY    . REPLACEMENT TOTAL KNEE      Social History   Social History  . Marital status: Married    Spouse name: N/A  . Number of children: N/A  . Years of education: N/A   Occupational History  . Not on file.   Social History Main Topics  . Smoking status: Former Smoker    Packs/day: 1.00    Years: 10.00    Quit date: 05/18/1965  . Smokeless tobacco: Never Used  . Alcohol use 2.4 oz/week    4 Glasses of wine per week  . Drug use: No  . Sexual activity: Not Currently   Other Topics Concern  . Not on file   Social History Narrative  . No narrative on file    Family History  Problem Relation Age of Onset  . Hypertension Mother   . Depression Mother   . Heart failure Father   . Obesity Brother   . Heart disease Brother   . Stroke Brother   . Depression Brother   . Breast cancer Cousin        2 mat and 1 pat cousins     Current Outpatient Prescriptions:  .  acetaminophen (TYLENOL) 325 MG tablet, Take 500 mg by mouth., Disp: , Rfl:  .  amLODipine (NORVASC) 5 MG tablet, Take 5 mg by mouth., Disp: , Rfl:  .  azithromycin (ZITHROMAX) 250 MG tablet, Take 1 tablet (250 mg total) by mouth daily. Take first 2 tablets together, then 1 every day until finished., Disp: 6 tablet, Rfl: 0 .  buPROPion (WELLBUTRIN SR) 100 MG 12 hr tablet, Take 1 tablet (100 mg total) by mouth daily., Disp: 30 tablet, Rfl: 1 .  busPIRone (BUSPAR) 10 MG tablet, Take 10 mg by mouth 3 (three) times daily., Disp: , Rfl:  .  chlorpheniramine-HYDROcodone (TUSSIONEX PENNKINETIC ER) 10-8 MG/5ML SUER, Take 5 mLs by mouth every 12 (twelve) hours as needed for cough., Disp: 115 mL, Rfl: 0 .  Cholecalciferol (VITAMIN D-1000 MAX ST) 1000 units tablet, Take 1,000 Units by mouth., Disp: , Rfl:  .  fexofenadine-pseudoephedrine  (ALLEGRA-D ALLERGY & CONGESTION) 180-240 MG 24 hr tablet, Take 1 tablet by mouth daily., Disp: 30 tablet, Rfl: 0 .  lisinopril (PRINIVIL,ZESTRIL) 20 MG tablet, Take 20 mg by mouth., Disp: , Rfl:  .  loperamide (IMODIUM) 2 MG capsule, Take by mouth as needed for diarrhea or loose stools., Disp: , Rfl:  .  Magnesium 250 MG TABS, Take 2 tablets by mouth daily., Disp: , Rfl:  .  meloxicam (MOBIC) 7.5 MG tablet, Take 7.5 mg by mouth., Disp: , Rfl:  .  Omega-3 Fatty Acids (FISH OIL) 1000 MG CAPS, Take 1,000 mg by mouth., Disp: , Rfl:  .  omeprazole (PRILOSEC) 40 MG capsule, Take 20 mg by mouth. , Disp: , Rfl:  .  rosuvastatin (CRESTOR) 5 MG tablet, Take 5 mg by mouth daily., Disp: , Rfl:  .  sertraline (ZOLOFT) 100 MG tablet, Take 1.5 tablets (150 mg total) by mouth daily., Disp: 45 tablet, Rfl: 1 .  Turmeric Curcumin 500  MG CAPS, Take 500 mg by mouth., Disp: , Rfl:   Physical exam:  Vitals:   09/18/16 1054  BP: (!) 149/88  Pulse: 82  Resp: 18  Temp: 97.9 F (36.6 C)  TempSrc: Tympanic  Weight: 201 lb 11.5 oz (91.5 kg)   Physical Exam  Constitutional: She is oriented to person, place, and time and well-developed, well-nourished, and in no distress.  HENT:  Head: Normocephalic and atraumatic.  Eyes: EOM are normal. Pupils are equal, round, and reactive to light.  Neck: Normal range of motion.  Cardiovascular: Normal rate, regular rhythm and normal heart sounds.   Pulmonary/Chest: Effort normal and breath sounds normal.  Abdominal: Soft. Bowel sounds are normal.  Neurological: She is alert and oriented to person, place, and time.  Skin: Skin is warm and dry.   breast exam not done today  CMP Latest Ref Rng & Units 09/06/2016  Glucose 65 - 99 mg/dL 102(H)  BUN 6 - 20 mg/dL 23(H)  Creatinine 0.44 - 1.00 mg/dL 1.09(H)  Sodium 135 - 145 mmol/L 138  Potassium 3.5 - 5.1 mmol/L 4.1  Chloride 101 - 111 mmol/L 102  CO2 22 - 32 mmol/L 26  Calcium 8.9 - 10.3 mg/dL 9.4  Total Protein 6.5 - 8.1  g/dL 7.4  Total Bilirubin 0.3 - 1.2 mg/dL 0.5  Alkaline Phos 38 - 126 U/L 64  AST 15 - 41 U/L 21  ALT 14 - 54 U/L 15   CBC Latest Ref Rng & Units 09/06/2016  WBC 3.6 - 11.0 K/uL 5.3  Hemoglobin 12.0 - 16.0 g/dL 11.9(L)  Hematocrit 35.0 - 47.0 % 36.1  Platelets 150 - 440 K/uL 231    No images are attached to the encounter.  Mr Breast Bilateral W Wo Contrast  Result Date: 09/18/2016 CLINICAL DATA:  76 year old female with recently diagnosed invasive mammary carcinoma with lobular features at 9:30 and 10 o'clock within the right breast. LABS:  None. EXAM: BILATERAL BREAST MRI WITH AND WITHOUT CONTRAST TECHNIQUE: Multiplanar, multisequence MR images of both breasts were obtained prior to and following the intravenous administration of 20 ml of MultiHance. THREE-DIMENSIONAL MR IMAGE RENDERING ON INDEPENDENT WORKSTATION: Three-dimensional MR images were rendered by post-processing of the original MR data on an independent workstation. The three-dimensional MR images were interpreted, and findings are reported in the following complete MRI report for this study. Three dimensional images were evaluated at the independent DynaCad workstation COMPARISON:  Previous exam(s). FINDINGS: Breast composition: c. Heterogeneous fibroglandular tissue. Background parenchymal enhancement: Minimal. Right breast: Two separate clip artifacts are seen within the upper, outer right breast on series 3, image 118 at middle and posterior depth. The clip artifacts are separated by approximately 5.2 cm. There is an irregular, enhancing mass adjacent to the clip artifact within the upper, outer right breast, middle depth, corresponding to the mass seen at 10 o'clock, 5 cm from the nipple on ultrasound. The enhancement measures 1.6 x 1.2 x 1.6 cm (AP x TR x CC). There is an additional irregular enhancing mass adjacent to the more posterior clip artifact measuring 2.6 x 1.4 x 1.6 cm (AP x TR x CC), correspond to the mass seen at 9:30,  7 cm from the nipple on ultrasound. Together, these 2 masses span 6.3 cm of breast tissue from anterior to posterior. No definite additional area of suspicious enhancement is seen to correspond to the third area of distortion identified on diagnostic mammogram dated 08/10/2016. Left breast: No suspicious mass or enhancement. Lymph nodes: No abnormal  appearing lymph nodes. Ancillary findings: A nonenhancing, T2 hyperintense oval mass is incidentally noted within the liver, likely representing a small cyst. This mass is unchanged in size from the patient's December 14, 2015 chest CT. IMPRESSION: Known right breast carcinoma. No definite additional suspicious area of enhancement is seen within the right breast to correspond to the third area of distortion identified on diagnostic mammogram dated 08/10/2016. RECOMMENDATION: 1. Treatment plan. 2. No definite additional suspicious area of enhancement is seen within the right breast to correspond to the third area of distortion identified on diagnostic mammogram dated 08/10/2016. If breast conservation is being considered and it would aid in clinical management, tomosynthesis guided biopsy of the third area of distortion could be performed. BI-RADS CATEGORY  6: Known biopsy-proven malignancy. Electronically Signed   By: Dalphine Handing M.D.   On: 09/18/2016 13:05   Mm Clip Placement Right  Result Date: 08/24/2016 CLINICAL DATA:  Post ultrasound-guided core needle biopsy of 2 right breast masses. EXAM: DIAGNOSTIC RIGHT MAMMOGRAM POST ULTRASOUND BIOPSY COMPARISON:  Previous exam(s). FINDINGS: Mammographic images were obtained following ultrasound guided biopsy of right breast 930 and 10 o'clock masses. Two-view mammography demonstrates presence of coil shaped marker within the right breast 930 o'clock position, posterior depth, an wing shaped marker within the right breast 10 o'clock, middle depth. The biopsy sites correspond to the 2 areas of architectural distortion/  asymmetries seen mammographically. A third focal asymmetry in the slightly upper inner right breast, middle depth has not bean sampled. Therefore, if the patient decides on breast conservation therapy, a stereotactic core needle biopsy may be considered for this finding. IMPRESSION: Successful placement of tissue markers port ultrasound-guided core needle biopsy of the right breast. Stereotactic core needle biopsy may be considered for a third area identified on the most recent diagnostic mammogram, should the patient decides on breast conservation therapy. Final Assessment: Post Procedure Mammograms for Marker Placement Electronically Signed   By: Ted Mcalpine M.D.   On: 08/24/2016 16:51   [place] Rt Breast Bx W Loc Dev 1st Lesion Img Bx Spec US Guide  Addendum Date: 08/25/2016   ADDENDUM REPORT: 08/25/2016 16:43 ADDENDUM: Pathology results of a biopsy of the right breast at 9:30 position showed invasive mammary carcinoma, diffusely infiltrated with lobular like features. Pathology results are concordant with imaging findings. Pathology results of a biopsy of the right breast at 10 o'clock position show invasive mammary carcinoma with lobular features. Pathology results are concordant with imaging findings. I discussed pathology results with the patient today by telephone and her questions were answered. She reports no problems with the right breast after the biopsies. Please note that Dr. Carmela Rima suggested the possibility of a third stereotactic biopsy of the right breast if the patient desires to have breast conservation therapy. This biopsy could be scheduled as needed. The patient is aware that a third biopsy has been suggested, if she desires breast conservation therapy. Consideration could be given to breast MRI given the diagnosis of lobular carcinoma and given the presence of multifocal disease in the right breast, depending on the patient's renal function. Prior nurse navigator is will arrange for  surgical consultation for the patient and contact her next week with the appointment. Electronically Signed   By: Britta Mccreedy M.D.   On: 08/25/2016 16:43   Result Date: 08/25/2016 CLINICAL DATA:  Suspicious right breast masses and 930 and 10 o'clock. EXAM: ULTRASOUND GUIDED RIGHT BREAST CORE NEEDLE BIOPSY COMPARISON:  Previous exam(s). FINDINGS: I met with the patient  and we discussed the procedure of ultrasound-guided biopsy, including benefits and alternatives. We discussed the high likelihood of a successful procedure. We discussed the risks of the procedure, including infection, bleeding, tissue injury, clip migration, and inadequate sampling. Informed written consent was given. The usual time-out protocol was performed immediately prior to the procedure. Lesion quadrant: Upper outer quadrant Using sterile technique and 1% Lidocaine as local anesthetic, under direct ultrasound visualization, a 14 gauge spring-loaded device was used to perform biopsy of mass at 9:30 o'clock using a inferior approach. At the conclusion of the procedure a coil shaped tissue marker clip was deployed into the biopsy cavity. Next, using sterile technique and 1% Lidocaine as local anesthetic, under direct ultrasound visualization, a 14 gauge spring-loaded device was used to perform biopsy of mass at 10 o'clock using a inferior approach. At the conclusion of the procedure a wing shaped tissue marker clip was deployed into the biopsy cavity. Follow up 2 view mammogram was performed and dictated separately. IMPRESSION: Ultrasound guided biopsy of 2 right breast masses at 9:30 and 10 o'clock. No apparent complications. Electronically Signed: By: Fidela Salisbury M.D. On: 08/24/2016 14:07   [place] Rt Breast Bx W Loc Dev Ea Add Lesion Img Bx Spec US Guide  Addendum Date: 08/25/2016   ADDENDUM REPORT: 08/25/2016 16:43 ADDENDUM: Pathology results of a biopsy of the right breast at 9:30 position showed invasive mammary carcinoma, diffusely  infiltrated with lobular like features. Pathology results are concordant with imaging findings. Pathology results of a biopsy of the right breast at 10 o'clock position show invasive mammary carcinoma with lobular features. Pathology results are concordant with imaging findings. I discussed pathology results with the patient today by telephone and her questions were answered. She reports no problems with the right breast after the biopsies. Please note that Dr. Jetta Lout suggested the possibility of a third stereotactic biopsy of the right breast if the patient desires to have breast conservation therapy. This biopsy could be scheduled as needed. The patient is aware that a third biopsy has been suggested, if she desires breast conservation therapy. Consideration could be given to breast MRI given the diagnosis of lobular carcinoma and given the presence of multifocal disease in the right breast, depending on the patient's renal function. Prior nurse navigator is will arrange for surgical consultation for the patient and contact her next week with the appointment. Electronically Signed   By: Curlene Dolphin M.D.   On: 08/25/2016 16:43   Result Date: 08/25/2016 CLINICAL DATA:  Suspicious right breast masses and 930 and 10 o'clock. EXAM: ULTRASOUND GUIDED RIGHT BREAST CORE NEEDLE BIOPSY COMPARISON:  Previous exam(s). FINDINGS: I met with the patient and we discussed the procedure of ultrasound-guided biopsy, including benefits and alternatives. We discussed the high likelihood of a successful procedure. We discussed the risks of the procedure, including infection, bleeding, tissue injury, clip migration, and inadequate sampling. Informed written consent was given. The usual time-out protocol was performed immediately prior to the procedure. Lesion quadrant: Upper outer quadrant Using sterile technique and 1% Lidocaine as local anesthetic, under direct ultrasound visualization, a 14 gauge spring-loaded device was used  to perform biopsy of mass at 9:30 o'clock using a inferior approach. At the conclusion of the procedure a coil shaped tissue marker clip was deployed into the biopsy cavity. Next, using sterile technique and 1% Lidocaine as local anesthetic, under direct ultrasound visualization, a 14 gauge spring-loaded device was used to perform biopsy of mass at 10 o'clock using a inferior approach.  At the conclusion of the procedure a wing shaped tissue marker clip was deployed into the biopsy cavity. Follow up 2 view mammogram was performed and dictated separately. IMPRESSION: Ultrasound guided biopsy of 2 right breast masses at 9:30 and 10 o'clock. No apparent complications. Electronically Signed: By: Fidela Salisbury M.D. On: 08/24/2016 14:07     Assessment and plan- Patient is a 76 y.o. 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 There were 2 areas of concern corresponding to the lesions on ultrasound which measures slightly bigger on MRI at 1.6 and 2.6 cm respectively. There was also a third area of concern noted on ultrasound but was not seen on MRI. No pathological appearing lymphadenopathy. Also noted to have a liver cyst which has remained unchanged since September 2017. At this point given that her biopsy showed invasive carcinoma with lobular features, Dr. Mike Gip had not planned on doing any neoadjuvant chemotherapy as was discussed at the breast conference. She will therefore proceed with definitive surgery at this point and Dr. Mike Gip will see her back in 2 weeks' time and discuss about getting mammogram testing on the tumor specimen and decide if she would benefit from adjuvant chemotherapy. I explained to the patient on mammogram for testing is and how the results are interpreted. If she falls into the low-risk group she would not require adjuvant chemotherapy but if  she falls in the high risk group and adjuvant chemotherapy would be indicated. Patient is an understanding of the plan. I have also spoken to Dr. Tamala Julian over the phone today and discussed the MRI findings with him. Given that there is no third area of concern in her right breast he might lean towards a partial mastectomy for those 2 lesions instead of total right mastectomy which is the patient's preference as well   Visit Diagnosis 1. Malignant neoplasm of upper-outer quadrant of right breast in female, estrogen receptor positive ([place])      Dr. Randa Evens, MD, MPH [facility] Pager- [phone] 09/18/2016 2:27 PM

## 2016-09-18 NOTE — Telephone Encounter (Signed)
Called pt per Dr. Janese Banks request to let her know that she did speak to Dr. Tamala Julian and since they did not feel that the 3rd spot was suspicious cancer looking spot then Dr. Tamala Julian is looking at a partial mastectomy and a member of his office should call her. She was thankful for the call.

## 2016-09-18 NOTE — Progress Notes (Signed)
Patient here today for MRI results. 

## 2016-09-19 ENCOUNTER — Other Ambulatory Visit: Payer: Self-pay | Admitting: Surgery

## 2016-09-19 DIAGNOSIS — Z170 Estrogen receptor positive status [ER+]: Principal | ICD-10-CM

## 2016-09-19 DIAGNOSIS — C50411 Malignant neoplasm of upper-outer quadrant of right female breast: Secondary | ICD-10-CM

## 2016-09-21 ENCOUNTER — Encounter
Admission: RE | Admit: 2016-09-21 | Discharge: 2016-09-21 | Disposition: A | Discharge status: Home / Self Care | Payer: PPO | Source: Ambulatory Visit | Attending: Surgery | Admitting: Surgery

## 2016-09-21 ENCOUNTER — Ambulatory Visit (HOSPITAL_COMMUNITY): Payer: PPO

## 2016-09-21 DIAGNOSIS — Z01810 Encounter for preprocedural cardiovascular examination: Secondary | ICD-10-CM | POA: Diagnosis not present

## 2016-09-21 DIAGNOSIS — I10 Essential (primary) hypertension: Secondary | ICD-10-CM | POA: Insufficient documentation

## 2016-09-21 HISTORY — DX: Depression, unspecified: F32.A

## 2016-09-21 HISTORY — DX: Major depressive disorder, single episode, unspecified: F32.9

## 2016-09-21 NOTE — Patient Instructions (Signed)
Your procedure is scheduled ZT:IWPY 6, 2018 Report TO [place] AT 11:00 AM   REMEMBER: Instructions that are not followed completely may result in serious medical risk up to and including death; or upon the discretion of your surgeon and anesthesiologist your surgery may need to be rescheduled.  Do not eat food or drink liquids after midnight. No gum chewing or hard candies  No Alcohol for 24 hours before or after surgery.  No Smoking for 24 hours prior to surgery.  Notify your doctor if there is any change in your medical condition (cold, fever, infection).  Do not wear jewelry, make-up, hairpins, clips or nail polish.  Do not wear lotions, powders, or perfumes AND NO DEODORANT  Do not shave 48 hours prior to surgery. Men may shave face and neck.  Do not bring valuables to the hospital. [facility] is not responsible for any belongings or valuables.   TAKE THESE MEDICATIONS THE MORNING OF SURGERY WITH A SIP OF WATER: AMLODIPINE LISINOPRIL ZOLOFT CRESTOR OMEPRAZOLE TAKE A DOSE THE NIGHT BEFORE AND THE DAY OF SURGERY  WELLBUTRIN BUSPAR   Use CHG Soap or wipes as directed on instruction sheet..  Follow recommendations from Cardiologist, Pulmonologist or PCP regarding stopping Aspirin, Coumadin, Plavix, Eliquis, Pradaxa, or Pletal.  Stop Anti-inflammatories such as Advil, Aleve, Ibuprofen, Motrin, Naproxen, Naprosyn, Goodie powder, or aspirin products. (May take Tylenol or Acetaminophen and Celebrex if needed.)  Stop supplements until after surgery.FISH OIL , TURMERIC (May continue Vitamin D, Vitamin B, and multivitamin.)  If you are being admitted to the hospital overnight, leave your suitcase in the car. After surgery it may be brought to your room.  If you are being discharged the day of surgery, you will not be allowed to drive home. You will need someone to drive you home and stay with you that night.   If you are taking public transportation, you will  need to have a responsible adult to with you.

## 2016-09-25 ENCOUNTER — Ambulatory Visit: Payer: PPO | Admitting: Oncology

## 2016-09-29 ENCOUNTER — Encounter: Payer: Self-pay | Admitting: *Deleted

## 2016-09-29 ENCOUNTER — Encounter
Admission: RE | Disposition: A | Discharge status: Home / Self Care | Payer: Self-pay | Source: Ambulatory Visit | Attending: Surgery

## 2016-09-29 ENCOUNTER — Ambulatory Visit: Payer: PPO

## 2016-09-29 ENCOUNTER — Ambulatory Visit
Admission: RE | Admit: 2016-09-29 | Discharge: 2016-09-29 | Disposition: A | Discharge status: Home / Self Care | Payer: PPO | Source: Ambulatory Visit | Attending: Surgery | Admitting: Surgery

## 2016-09-29 ENCOUNTER — Ambulatory Visit: Payer: PPO | Admitting: Certified Registered"

## 2016-09-29 ENCOUNTER — Encounter: Payer: Self-pay | Admitting: Anesthesiology

## 2016-09-29 DIAGNOSIS — Z170 Estrogen receptor positive status [ER+]: Principal | ICD-10-CM

## 2016-09-29 DIAGNOSIS — Z87891 Personal history of nicotine dependence: Secondary | ICD-10-CM | POA: Insufficient documentation

## 2016-09-29 DIAGNOSIS — E7800 Pure hypercholesterolemia, unspecified: Secondary | ICD-10-CM | POA: Insufficient documentation

## 2016-09-29 DIAGNOSIS — I129 Hypertensive chronic kidney disease with stage 1 through stage 4 chronic kidney disease, or unspecified chronic kidney disease: Secondary | ICD-10-CM | POA: Diagnosis not present

## 2016-09-29 DIAGNOSIS — C50411 Malignant neoplasm of upper-outer quadrant of right female breast: Secondary | ICD-10-CM

## 2016-09-29 DIAGNOSIS — Z96651 Presence of right artificial knee joint: Secondary | ICD-10-CM | POA: Insufficient documentation

## 2016-09-29 DIAGNOSIS — M1990 Unspecified osteoarthritis, unspecified site: Secondary | ICD-10-CM | POA: Insufficient documentation

## 2016-09-29 DIAGNOSIS — I10 Essential (primary) hypertension: Secondary | ICD-10-CM | POA: Diagnosis not present

## 2016-09-29 DIAGNOSIS — F329 Major depressive disorder, single episode, unspecified: Secondary | ICD-10-CM | POA: Insufficient documentation

## 2016-09-29 DIAGNOSIS — Z90710 Acquired absence of both cervix and uterus: Secondary | ICD-10-CM | POA: Insufficient documentation

## 2016-09-29 DIAGNOSIS — Z85828 Personal history of other malignant neoplasm of skin: Secondary | ICD-10-CM | POA: Diagnosis not present

## 2016-09-29 DIAGNOSIS — K219 Gastro-esophageal reflux disease without esophagitis: Secondary | ICD-10-CM | POA: Diagnosis not present

## 2016-09-29 DIAGNOSIS — C50911 Malignant neoplasm of unspecified site of right female breast: Secondary | ICD-10-CM | POA: Diagnosis not present

## 2016-09-29 DIAGNOSIS — N189 Chronic kidney disease, unspecified: Secondary | ICD-10-CM | POA: Diagnosis not present

## 2016-09-29 DIAGNOSIS — R928 Other abnormal and inconclusive findings on diagnostic imaging of breast: Secondary | ICD-10-CM | POA: Diagnosis not present

## 2016-09-29 DIAGNOSIS — Z79899 Other long term (current) drug therapy: Secondary | ICD-10-CM | POA: Diagnosis not present

## 2016-09-29 HISTORY — PX: PARTIAL MASTECTOMY WITH NEEDLE LOCALIZATION: SHX6008

## 2016-09-29 HISTORY — PX: SENTINEL NODE BIOPSY: SHX6608

## 2016-09-29 SURGERY — PARTIAL MASTECTOMY WITH NEEDLE LOCALIZATION
Anesthesia: General | Site: Breast | Laterality: Right | Wound class: Clean

## 2016-09-29 MED ORDER — ONDANSETRON HCL 4 MG/2ML IJ SOLN: 4.0000 mg | Freq: Once | INTRAMUSCULAR | Status: DC | PRN

## 2016-09-29 MED ORDER — BUPIVACAINE-EPINEPHRINE 0.5% -1:200000 IJ SOLN
INTRAMUSCULAR | Status: DC | PRN
  Administered 2016-09-29: 11 mL

## 2016-09-29 MED ORDER — DEXAMETHASONE SODIUM PHOSPHATE 10 MG/ML IJ SOLN
INTRAMUSCULAR | Status: AC
  Filled 2016-09-29: qty 1

## 2016-09-29 MED ORDER — PROPOFOL 10 MG/ML IV BOLUS
INTRAVENOUS | Status: AC
  Filled 2016-09-29: qty 40

## 2016-09-29 MED ORDER — DEXAMETHASONE SODIUM PHOSPHATE 10 MG/ML IJ SOLN
INTRAMUSCULAR | Status: DC | PRN
  Administered 2016-09-29: 10 mg via INTRAVENOUS

## 2016-09-29 MED ORDER — FENTANYL CITRATE (PF) 100 MCG/2ML IJ SOLN
INTRAMUSCULAR | Status: DC | PRN
  Administered 2016-09-29: 25 ug via INTRAVENOUS
  Administered 2016-09-29: 50 ug via INTRAVENOUS
  Administered 2016-09-29: 25 ug via INTRAVENOUS

## 2016-09-29 MED ORDER — LACTATED RINGERS IV SOLN
INTRAVENOUS | Status: DC
  Administered 2016-09-29 (×3): via INTRAVENOUS

## 2016-09-29 MED ORDER — BUPIVACAINE-EPINEPHRINE (PF) 0.5% -1:200000 IJ SOLN
INTRAMUSCULAR | Status: AC
  Filled 2016-09-29: qty 30

## 2016-09-29 MED ORDER — ONDANSETRON HCL 4 MG/2ML IJ SOLN
INTRAMUSCULAR | Status: AC
  Filled 2016-09-29: qty 2

## 2016-09-29 MED ORDER — MIDAZOLAM HCL 2 MG/2ML IJ SOLN
INTRAMUSCULAR | Status: AC
  Filled 2016-09-29: qty 2

## 2016-09-29 MED ORDER — LIDOCAINE HCL (PF) 2 % IJ SOLN
INTRAMUSCULAR | Status: AC
  Filled 2016-09-29: qty 2

## 2016-09-29 MED ORDER — MIDAZOLAM HCL 2 MG/2ML IJ SOLN
INTRAMUSCULAR | Status: DC | PRN
  Administered 2016-09-29: 2 mg via INTRAVENOUS

## 2016-09-29 MED ORDER — TECHNETIUM TC 99M SULFUR COLLOID FILTERED
0.7790 | Freq: Once | INTRAVENOUS | Status: AC | PRN
  Administered 2016-09-29: 0.779 via INTRADERMAL

## 2016-09-29 MED ORDER — GLYCOPYRROLATE 0.2 MG/ML IJ SOLN
INTRAMUSCULAR | Status: DC | PRN
  Administered 2016-09-29 (×2): 0.1 mg via INTRAVENOUS

## 2016-09-29 MED ORDER — PHENYLEPHRINE HCL 10 MG/ML IJ SOLN
INTRAMUSCULAR | Status: AC
  Filled 2016-09-29: qty 1

## 2016-09-29 MED ORDER — FENTANYL CITRATE (PF) 100 MCG/2ML IJ SOLN
INTRAMUSCULAR | Status: AC
  Filled 2016-09-29: qty 2

## 2016-09-29 MED ORDER — HYDROCODONE-ACETAMINOPHEN 5-325 MG PO TABS: 1.0000 | ORAL_TABLET | ORAL | Status: DC | PRN

## 2016-09-29 MED ORDER — PROPOFOL 10 MG/ML IV BOLUS
INTRAVENOUS | Status: DC | PRN
  Administered 2016-09-29: 140 mg via INTRAVENOUS

## 2016-09-29 MED ORDER — EPHEDRINE SULFATE 50 MG/ML IJ SOLN
INTRAMUSCULAR | Status: AC
  Filled 2016-09-29: qty 1

## 2016-09-29 MED ORDER — PHENYLEPHRINE HCL 10 MG/ML IJ SOLN
INTRAMUSCULAR | Status: DC | PRN
  Administered 2016-09-29 (×3): 100 ug via INTRAVENOUS

## 2016-09-29 MED ORDER — ONDANSETRON HCL 4 MG/2ML IJ SOLN
INTRAMUSCULAR | Status: DC | PRN
  Administered 2016-09-29: 4 mg via INTRAVENOUS

## 2016-09-29 MED ORDER — FENTANYL CITRATE (PF) 100 MCG/2ML IJ SOLN: 25.0000 ug | INTRAMUSCULAR | Status: DC | PRN

## 2016-09-29 MED ORDER — SODIUM CHLORIDE 0.9 % IV SOLN
INTRAVENOUS | Status: DC | PRN
  Administered 2016-09-29: 30 ug/min via INTRAVENOUS

## 2016-09-29 MED ORDER — LIDOCAINE HCL (CARDIAC) 20 MG/ML IV SOLN
INTRAVENOUS | Status: DC | PRN
  Administered 2016-09-29: 100 mg via INTRAVENOUS

## 2016-09-29 MED ORDER — EPHEDRINE SULFATE 50 MG/ML IJ SOLN
INTRAMUSCULAR | Status: DC | PRN
  Administered 2016-09-29: 10 mg via INTRAVENOUS

## 2016-09-29 MED ORDER — GLYCOPYRROLATE 0.2 MG/ML IJ SOLN
INTRAMUSCULAR | Status: AC
  Filled 2016-09-29: qty 1

## 2016-09-29 SURGICAL SUPPLY — 33 items
BLADE SURG 15 STRL LF DISP TIS (BLADE) ×1 IMPLANT
BLADE SURG 15 STRL SS (BLADE) ×2
CANISTER SUCT 1200ML W/VALVE (MISCELLANEOUS) ×3 IMPLANT
CHLORAPREP W/TINT 26ML (MISCELLANEOUS) ×3 IMPLANT
CNTNR SPEC 2.5X3XGRAD LEK (MISCELLANEOUS) ×2
CONT SPEC 4OZ STER OR WHT (MISCELLANEOUS) ×4
CONTAINER SPEC 2.5X3XGRAD LEK (MISCELLANEOUS) ×2 IMPLANT
DERMABOND ADVANCED (GAUZE/BANDAGES/DRESSINGS) ×2
DERMABOND ADVANCED .7 DNX12 (GAUZE/BANDAGES/DRESSINGS) ×1 IMPLANT
DEVICE DUBIN SPECIMEN MAMMOGRA (MISCELLANEOUS) ×6 IMPLANT
DRAPE LAPAROTOMY 77X122 PED (DRAPES) ×3 IMPLANT
ELECT REM PT RETURN 9FT ADLT (ELECTROSURGICAL) ×3
ELECTRODE REM PT RTRN 9FT ADLT (ELECTROSURGICAL) ×1 IMPLANT
GLOVE BIO SURGEON STRL SZ7.5 (GLOVE) ×9 IMPLANT
GOWN STRL REUS W/ TWL LRG LVL3 (GOWN DISPOSABLE) ×2 IMPLANT
GOWN STRL REUS W/TWL LRG LVL3 (GOWN DISPOSABLE) ×4
KIT RM TURNOVER STRD PROC AR (KITS) ×3 IMPLANT
LABEL OR SOLS (LABEL) ×3 IMPLANT
MARGIN MAP 10MM (MISCELLANEOUS) ×6 IMPLANT
NDL SAFETY 18GX1.5 (NEEDLE) ×3 IMPLANT
NDL SAFETY 22GX1.5 (NEEDLE) ×3 IMPLANT
NEEDLE HYPO 25X1 1.5 SAFETY (NEEDLE) ×3 IMPLANT
PACK BASIN MINOR ARMC (MISCELLANEOUS) ×3 IMPLANT
SLEVE PROBE SENORX GAMMA FIND (MISCELLANEOUS) ×3 IMPLANT
SUT CHROMIC 3 0 SH 27 (SUTURE) IMPLANT
SUT CHROMIC 4 0 RB 1X27 (SUTURE) ×6 IMPLANT
SUT ETHILON 3-0 FS-10 30 BLK (SUTURE) ×6
SUT MNCRL 4-0 (SUTURE) ×4
SUT MNCRL 4-0 27XMFL (SUTURE) ×2
SUTURE EHLN 3-0 FS-10 30 BLK (SUTURE) ×2 IMPLANT
SUTURE MNCRL 4-0 27XMF (SUTURE) ×2 IMPLANT
SYRINGE 10CC LL (SYRINGE) ×3 IMPLANT
WATER STERILE IRR 1000ML POUR (IV SOLUTION) ×3 IMPLANT

## 2016-09-29 NOTE — Anesthesia Preprocedure Evaluation (Addendum)
Anesthesia Evaluation  Patient identified by MRN, date of birth, ID band Patient awake    Reviewed: Allergy & Precautions, H&P , NPO status , Patient's Chart, lab work & pertinent test results, reviewed documented beta blocker date and time   History of Anesthesia Complications Negative for: history of anesthetic complications  Airway Mallampati: III  TM Distance: >3 FB Neck ROM: full    Dental  (+) Caps, Chipped, Poor Dentition, Dental Advidsory Given   Pulmonary neg pulmonary ROS, former smoker,           Cardiovascular Exercise Tolerance: Good hypertension, (-) angina(-) CAD, (-) Past MI, (-) Cardiac Stents and (-) CABG negative cardio ROS  (-) dysrhythmias (-) Valvular Problems/Murmurs     Neuro/Psych PSYCHIATRIC DISORDERS (Depression) negative neurological ROS     GI/Hepatic Neg liver ROS, GERD  ,  Endo/Other  negative endocrine ROS  Renal/GU CRFRenal disease  negative genitourinary   Musculoskeletal   Abdominal   Peds  Hematology negative hematology ROS (+)   Anesthesia Other Findings Past Medical History: No date: Anxiety No date: Cancer ([place])     Comment: skin ca No date: Colon polyp No date: Depression No date: GERD (gastroesophageal reflux disease) No date: Hypercholesteremia No date: Hypertension No date: Osteoarthritis   Reproductive/Obstetrics negative OB ROS                           Anesthesia Physical Anesthesia Plan  ASA: III  Anesthesia Plan: General   Post-op Pain Management:    Induction: Intravenous  PONV Risk Score and Plan: 3 and Ondansetron and Dexamethasone  Airway Management Planned: LMA  Additional Equipment:   Intra-op Plan:   Post-operative Plan: Extubation in OR  Informed Consent: I have reviewed the patients History and Physical, chart, labs and discussed the procedure including the risks, benefits and alternatives for the proposed  anesthesia with the patient or authorized representative who has indicated his/her understanding and acceptance.   Dental Advisory Given  Plan Discussed with: Anesthesiologist, CRNA and Surgeon  Anesthesia Plan Comments:        Anesthesia Quick Evaluation

## 2016-09-29 NOTE — Transfer of Care (Signed)
Immediate Anesthesia Transfer of Care Note  Patient: Allison Hall  Procedure(s) Performed: Procedure(s): PARTIAL MASTECTOMY WITH NEEDLE LOCALIZATION (Right) SENTINEL NODE BIOPSY (Right)  Patient Location: PACU  Anesthesia Type:General  Level of Consciousness: drowsy and patient cooperative  Airway & Oxygen Therapy: Patient Spontanous Breathing and Patient connected to face mask oxygen  Post-op Assessment: Report given to RN, Post -op Vital signs reviewed and stable and Patient moving all extremities X 4  Post vital signs: Reviewed and stable  Last Vitals:  Vitals:   09/29/16 1302  BP: (!) 146/78  Resp: 16  Temp: 36.8 C    Last Pain:  Vitals:   09/29/16 1302  TempSrc: Oral         Complications: No apparent anesthesia complications

## 2016-09-29 NOTE — H&P (Signed)
  She comes in today prepared for right partial mastectomy with sentinel lymph node biopsy. She has had preoperative injection of technetium sulfur colloid and also insertion of 2 Kopan's wires to mark the 2 sites of cancer in the upper outer quadrant of the right breast.  Lab work reviewed  The right side has already been marked YES.  I discussed the plan for surgery.

## 2016-09-29 NOTE — Anesthesia Procedure Notes (Signed)
Procedure Name: LMA Insertion Date/Time: 09/29/2016 1:50 PM Performed by: Silvana Newness Pre-anesthesia Checklist: Patient identified, Emergency Drugs available, Suction available, Patient being monitored and Timeout performed Patient Re-evaluated:Patient Re-evaluated prior to inductionOxygen Delivery Method: Circle system utilized Preoxygenation: Pre-oxygenation with 100% oxygen Intubation Type: IV induction Ventilation: Mask ventilation without difficulty LMA: LMA inserted LMA Size: 4.0 Number of attempts: 1 Placement Confirmation: positive ETCO2 and breath sounds checked- equal and bilateral Tube secured with: Tape Dental Injury: Teeth and Oropharynx as per pre-operative assessment

## 2016-09-29 NOTE — Op Note (Signed)
OPERATIVE REPORT  PREOPERATIVE  DIAGNOSIS: . Right breast cancer  POSTOPERATIVE DIAGNOSIS: .Right breast cancer   PROCEDURE: . right partial mastectomy with sentinel lymph node biopsy   ANESTHESIA:  General  SURGEON: Rochel Brome  MD   INDICATIONS: .Marland Kitchen She had recent findings of 2 cancers in the upper outer quadrant of the right breast. Surgery was recommended for definitive treatment. She did have preoperative insertion of 2 Kopan's wires to mark the 2 cancers of the upper outer quadrant. One cancer was at the 10:00 position and posterior to that was another cancer at the 9:30 position. She also had preoperative injection of radioactive technetium sulfur colloid. Mammograms reviewed prior to incision.  With the patient on the operating table in the supine position under general anesthesia the dressings were removed from the right breast exposing 2 Kopan's wires which entered the upper outer quadrant of the right breast. These wires were cut 3 cm from the skin. The breast and surrounding chest wall were prepared with ChloraPrep and draped in a sterile manner. A curvilinear incision was made from the 9:30 position to the 11:30 position in the upper outer quadrant of the right breast 8 cm from the nipple. The dissection was carried down through subcutaneous tissues. Electrocautery was used for hemostasis. The first and anteriormost wire was encountered. There was a palpable mass adjacent to the thick portion of the wire. This mass was dissected free from surrounding structures using electrocautery for hemostasis. The specimen was marked with margin maps using 3-0 nylon sutures to attach markers to label the cranial caudal medial lateral superficial and deep margins. This was submitted for specimen mammogram. The radiologist called back to report that the biopsy marker was seen in the specimen mammogram. The specimen was then submitted for pathology and the pathologist called back and said the margins  appeared to be satisfactory.  Further dissection was carried down deeper within the breast to the remaining wire marker. A mass of tissue surrounding a palpable mass was excised including common tissue between the 2 tumors and also margin maps were used to label the cranial caudal medial lateral superficial and deep margins. This was submitted for specimen mammogram area the radiologist called to report that the biopsy marker was seen within the specimen. Later the pathologist called to say the margins appeared to be satisfactory except for being close to the anterior margin. It appeared that this was the site of the more anterior cancer finding a common margin. So margins appeared to be satisfactory. 2 clamped bleeding points were suture ligated with 4-0 chromic. Restasis was subsequently intact.  The gamma counter was used to probe the axilla demonstrating location of radioactivity in the inferior aspect of the axilla. An oblique incision was made in the inferior aspect of the axilla 5 cm in length and carried down through subcutaneous tissues. Electrocautery was used for hemostasis. Dissection was carried down deeply adjacent to the chest wall finding a focal area of radioactivity and a lymph node was encountered. This lymph node was removed with some surrounding fatty tissue using electrocautery for hemostasis. This lymph node had an ex vivo count in the range of 1200-1400 counts per second and was submitted as sentinel lymph node #1 for routine pathology. Further probing with the gamma counter demonstrating a another radioactive lymph node posterior to the first. This lymph node was removed with some surrounding fatty material and had ex vivo count in the range of 1100-1300 counts per second. This was submitted as sentinel  lymph node #2 for routine pathology.  The partial mastectomy wound was further inspected and could see hemostasis was intact. Subcuticular tissues were infiltrated with half percent  Sensorcaine with epinephrine. Subcutaneous tissues were approximated with interrupted 3-0 chromic. The skin was closed with running 4-0 Monocryl subcuticular suture.  The axillary wound was further inspected and could see hemostasis was intact. Subcuticular tissues were infiltrated with half percent Sensorcaine with epinephrine. The wound was closed with running 4-0 Monocryl subcuticular suture.  Both wounds were treated with Dermabond. The patient appear to be in satisfactory condition and was prepared for transfer to the recovery room  [place].D.

## 2016-09-29 NOTE — Anesthesia Post-op Follow-up Note (Cosign Needed)
Anesthesia QCDR form completed.        

## 2016-09-29 NOTE — Anesthesia Postprocedure Evaluation (Signed)
Anesthesia Post Note  Patient: Allison Hall  Procedure(s) Performed: Procedure(s) (LRB): PARTIAL MASTECTOMY WITH NEEDLE LOCALIZATION (Right) SENTINEL NODE BIOPSY (Right)  Patient location during evaluation: PACU Anesthesia Type: General Level of consciousness: awake and alert Pain management: pain level controlled Vital Signs Assessment: post-procedure vital signs reviewed and stable Respiratory status: spontaneous breathing and respiratory function stable Cardiovascular status: stable Anesthetic complications: no     Last Vitals:  Vitals:   09/29/16 1637 09/29/16 1647  BP: (!) 155/86 (!) 179/83  Pulse: 89 88  Resp: 16   Temp: 37.1 C 36.7 C    Last Pain:  Vitals:   09/29/16 1647  TempSrc: Temporal  PainSc: 2                  Ruari Duggan K

## 2016-09-29 NOTE — Discharge Instructions (Addendum)
AMBULATORY SURGERY  DISCHARGE INSTRUCTIONS  1) The drugs that you were given will stay in your system until tomorrow so for the next 24 hours you should not: A) Drive an automobile B) Make any legal decisions C) Drink any alcoholic beverage  2) You may resume regular meals tomorrow.  Today it is better to start with liquids and gradually work up to solid foods. You may eat anything you prefer, but it is better to start with liquids, then soup and crackers, and gradually work up to solid foods.  3) Please notify your doctor immediately if you have any unusual bleeding, trouble breathing, redness and pain at the surgery site, drainage, fever, or pain not relieved by medication.   4) Additional Instructions:   Take Tylenol or hydrocodone if needed for pain.  Should not drive or do anything dangerous and taking hydrocodone.  Continue usual medicines same dose as usually taken.  Wear bra as desired for comfort and support.  May shower and blot dry.    Please contact your physician with any problems or Same Day Surgery at [phone], Monday through Friday 6 am to 4 pm, or  at [facility] number at [phone]

## 2016-09-30 ENCOUNTER — Encounter: Payer: Self-pay | Admitting: Surgery

## 2016-10-04 ENCOUNTER — Inpatient Hospital Stay: Payer: PPO | Attending: Hematology and Oncology | Admitting: Hematology and Oncology

## 2016-10-04 ENCOUNTER — Encounter: Payer: Self-pay | Admitting: Hematology and Oncology

## 2016-10-04 VITALS — BP 146/94 | HR 81 | Temp 98.5°F | Resp 20 | Wt 202.8 lb

## 2016-10-04 DIAGNOSIS — I10 Essential (primary) hypertension: Secondary | ICD-10-CM | POA: Diagnosis not present

## 2016-10-04 DIAGNOSIS — F329 Major depressive disorder, single episode, unspecified: Secondary | ICD-10-CM | POA: Diagnosis not present

## 2016-10-04 DIAGNOSIS — C50411 Malignant neoplasm of upper-outer quadrant of right female breast: Secondary | ICD-10-CM | POA: Diagnosis not present

## 2016-10-04 DIAGNOSIS — Z170 Estrogen receptor positive status [ER+]: Secondary | ICD-10-CM | POA: Diagnosis not present

## 2016-10-04 DIAGNOSIS — Z87891 Personal history of nicotine dependence: Secondary | ICD-10-CM | POA: Diagnosis not present

## 2016-10-04 DIAGNOSIS — M1990 Unspecified osteoarthritis, unspecified site: Secondary | ICD-10-CM | POA: Insufficient documentation

## 2016-10-04 DIAGNOSIS — M8580 Other specified disorders of bone density and structure, unspecified site: Secondary | ICD-10-CM | POA: Insufficient documentation

## 2016-10-04 DIAGNOSIS — E7800 Pure hypercholesterolemia, unspecified: Secondary | ICD-10-CM | POA: Diagnosis not present

## 2016-10-04 DIAGNOSIS — Z9011 Acquired absence of right breast and nipple: Secondary | ICD-10-CM | POA: Diagnosis not present

## 2016-10-04 DIAGNOSIS — K219 Gastro-esophageal reflux disease without esophagitis: Secondary | ICD-10-CM | POA: Diagnosis not present

## 2016-10-04 DIAGNOSIS — F419 Anxiety disorder, unspecified: Secondary | ICD-10-CM | POA: Diagnosis not present

## 2016-10-04 DIAGNOSIS — M810 Age-related osteoporosis without current pathological fracture: Secondary | ICD-10-CM | POA: Insufficient documentation

## 2016-10-04 LAB — SURGICAL PATHOLOGY

## 2016-10-04 NOTE — Progress Notes (Signed)
[facility] day:  10/04/2016  Chief Complaint: Allison Hall is a 76 y.o. female with right breast cancer who is seen for reassessment after partial mastectomy.  HPI:  The patient was last seen in the medical oncology clinic on 09/06/2016 for initial consultation.  She had multi-focal right breast cancer s/p ultrasound guided core needle biopsies.  Lesions were in the right upper outer quadrant.  There was an apparent large area of involvement with 2 biopsies suggesting lobular carcinoma.  We discussed bilateral breast MRI to fully determine extent of disease   Breast MRI on 09/16/2016 revealed an irregular 1.6 x 1.2 x 1.6 cm enhancing mass adjacent to the clip artifact within the upper, outer right breast, middle depth, corresponding to the mass seen at 10 o'clock, 5 cm from the nipple on ultrasound. There was an additional irregular enhancing mass adjacent to the more posterior clip artifact measuring 2.6 x 1.4 x 1.6 cm, corresponding  to the mass seen at 9:30, 7 cm from the nipple on ultrasound. Together, these 2 masses spanned 6.3 cm of breast tissue from anterior to posterior. There was no definite additional suspicious area of enhancement within the right breast to correspond to the third area of distortion identified on diagnostic mammogram dated 08/10/2016.  Left breast revealed no suspicious mass or enhancement.  She underwent partial mastectomy with sentinel lymph node biopsy on 09/29/2016 by Dr. Rochel Brome.  Pathology is pending.  Symptomatically, she is doing well.  She denies any pain.  Blood pressure is elevated today secondary to poor sleep last night.  She is anxious about her pathology results.   Past Medical History:  Diagnosis Date  . Anxiety   . Cancer ([place])    skin ca  . Colon polyp   . Depression   . GERD (gastroesophageal reflux disease)   . Hypercholesteremia   . Hypertension   . Osteoarthritis     Past Surgical  History:  Procedure Laterality Date  . ABDOMINAL HYSTERECTOMY    . APPENDECTOMY    . JOINT REPLACEMENT    . NOSE SURGERY    . PARTIAL MASTECTOMY WITH NEEDLE LOCALIZATION Right 09/29/2016   Procedure: PARTIAL MASTECTOMY WITH NEEDLE LOCALIZATION;  Surgeon: [name], MD;  Location: [facility] ORS;  Service: General;  Laterality: Right;  . REPLACEMENT TOTAL KNEE Right 2008  . SENTINEL NODE BIOPSY Right 09/29/2016   Procedure: SENTINEL NODE BIOPSY;  Surgeon: [name], MD;  Location: [facility] ORS;  Service: General;  Laterality: Right;    Family History  Problem Relation Age of Onset  . Hypertension Mother   . Depression Mother   . Heart failure Father   . Obesity Brother   . Heart disease Brother   . Stroke Brother   . Depression Brother   . Breast cancer Cousin        2 mat and 1 pat cousins    Social History:  reports that she quit smoking about 51 years ago. She has a 10.00 pack-year smoking history. She has never used smokeless tobacco. She reports that she drinks about 2.4 oz of alcohol per week . She reports that she does not use drugs.  She previously smoked 1/2 pack/day x 10 years.  She stopped smoking on 10/11/1978.  She drinks 6 glasses of wine/week.  She has 3 children (2 sons and 1 daughter).  She is a retired Clinical cytogeneticist for [employer].  She likes to do crossword puzzles.  She lives in [place].  Her daughter's name is [name].   Allergies:  Allergies  Allergen Reactions  . Sulfamethoxazole Other (See Comments)    Anxiety and imsomnia  . Trazodone Other (See Comments)    Pt states she felt "unsteady"   . Ciprofloxacin Anxiety  . Sulfa Antibiotics Anxiety    Current Medications: Current Outpatient Prescriptions  Medication Sig Dispense Refill  . acetaminophen (TYLENOL) 500 MG tablet Take 500 mg by mouth 2 (two) times daily.    Marland Kitchen amLODipine (NORVASC) 5 MG tablet Take 5 mg by mouth daily.     Marland Kitchen buPROPion (WELLBUTRIN SR) 100 MG 12 hr tablet Take 1 tablet (100 mg  total) by mouth daily. (Patient taking differently: Take 200 mg by mouth daily. ) 30 tablet 1  . busPIRone (BUSPAR) 10 MG tablet Take 10 mg by mouth 3 (three) times daily.    . Cholecalciferol (VITAMIN D) 2000 units CAPS Take 4,000 Units by mouth daily.    Marland Kitchen lisinopril (PRINIVIL,ZESTRIL) 20 MG tablet Take 30 mg by mouth daily. Takes 1.5 tablet    . loperamide (IMODIUM) 2 MG capsule Take 2 mg by mouth as needed for diarrhea or loose stools.     . Magnesium 250 MG TABS Take 2 tablets by mouth daily.    . Omega-3 Fatty Acids (FISH OIL) 1000 MG CAPS Take 3,000 mg by mouth daily.     Marland Kitchen omeprazole (PRILOSEC) 40 MG capsule Take 20 mg by mouth.     . rosuvastatin (CRESTOR) 5 MG tablet Take 5 mg by mouth daily.    . sertraline (ZOLOFT) 100 MG tablet Take 1.5 tablets (150 mg total) by mouth daily. (Patient taking differently: Take 100-150 mg by mouth See admin instructions. Takes 100 mg daily except on Monday, Wednesday and Fridays and takes 150 mg (1.5 tablet)) 45 tablet 1  . Turmeric Curcumin 500 MG CAPS Take 1,500 mg by mouth daily.     Marland Kitchen UNABLE TO FIND Take 1,000 mg by mouth 3 (three) times daily. Hemp Oil capsules 1000 mg each Also contains Omega 3 and Omega 6 Fatty Acids     No current facility-administered medications for this visit.     Review of Systems:  GENERAL:  Feels "ok".  No fevers, sweats or weight loss. PERFORMANCE STATUS (ECOG):  0 HEENT:  No visual changes, runny nose, sore throat, mouth sores or tenderness. Lungs: No shortness of breath or cough.  No hemoptysis. Cardiac:  No chest pain, palpitations, orthopnea, or PND. GI:  No nausea, vomiting, diarrhea, constipation, melena or hematochezia. GU:  No urgency, frequency, dysuria, or hematuria. Musculoskeletal:  No back pain.  "Left knee bone on bone".  No muscle tenderness. Extremities:  No pain or swelling. Skin:  No rashes or skin changes. Neuro:  Poor balance.  No headache, numbness or weakness, or coordination  issues. Endocrine:  No diabetes or thyroid issues.  Some sweats and hot flashes with the weather. Psych:  Anxious.  No mood changes or depression. Pain:  No focal pain. Review of systems:  All other systems reviewed and found to be negative.  Physical Exam: Blood pressure (!) 146/94, pulse 81, temperature 98.5 F (36.9 C), temperature source Tympanic, resp. rate 20, weight 202 lb 13.2 oz (92 kg). GENERAL:  Well developed, well nourished, woman sitting comfortably in the exam room in no acute distress. MENTAL STATUS:  Alert and oriented to person, place and time. HEAD:  Short brown styled hair.  Normocephalic, atraumatic, face symmetric, no Cushingoid  features. EYES:  Glasses.  Green eyes.  No conjunctivitis or scleral icterus. NEUROLOGICAL: Unremarkable. PSYCH:  Appropriate.   No visits with results within 3 Day(s) from this visit.  Latest known visit with results is:  Hospital Outpatient Visit on 08/24/2016  Component Date Value Ref Range Status  . SURGICAL PATHOLOGY 08/24/2016    Final-Edited                   Value:Surgical Pathology THIS IS AN ADDENDUM REPORT CASE: [id number] PATIENT: [facility] Surgical Pathology Report Addendum  Reason for Addendum #1:  Immunohistochemistry results  SPECIMEN SUBMITTED: A. Breast, right, 9:30 B. Breast, right, 10:00  CLINICAL HISTORY: Masses  PRE-OPERATIVE DIAGNOSIS: IMC, possibly lobular  POST-OPERATIVE DIAGNOSIS: None provided.     DIAGNOSIS: A. BREAST, RIGHT 9:30; ULTRASOUND GUIDED BIOPSY: - INVASIVE MAMMARY CARCINOMA, DIFFUSELY INFILTRATIVE WITH LOBULAR-LIKE FEATURES.  Size of invasive carcinoma: 6 mm in this sample Histologic grade of invasive carcinoma: 2      Glandular/tubular differentiation score: 3      Nuclear pleomorphism score: 2      Mitotic rate score: 1      Total score: 6  Ductal carcinoma in situ: Not identified Lymphovascular invasion: Not identified  B. Breast, right 10:00; ULTRASOUND  GUIDED BIOPSY: - INVASIVE MAMMARY CARCINOMA, WITH LOBULAR FEATURES.  Size                          of invasive carcinoma: 8 mm in this sample Histologic grade of invasive carcinoma: 2      Glandular/tubular differentiation score: 3      Nuclear pleomorphism score: 2      Mitotic rate score: 1      Total score: 6  Ductal carcinoma in situ: Not identified Lymphovascular invasion: Not identified  Comment: The definitive grade will be assigned on the excisional specimen. The carcinoma is the two biopsy specimens have identical morphology. ER/PR/HER2: Immunohistochemistry will be performed on block A1, with reflex to Rachel for HER2 2+. The results will be reported in an addendum. These findings were communicated to Shepherd in Dr. Janace Hoard office on 08/25/2016. Read back procedure was performed.     GROSS DESCRIPTION:  A. The specimen is received in a formalin-filled container labeled with the patient's name and right breast 9:30, 7 cm from nipple.  Core pieces: multiple, aggregate 1.6 x 0.4 x 0.1 cm Comments: red yellow lobulated fibrofatty tissue, ma                         rked green  Entirely submitted in cassette(s): 1  Time/Date in fixative: placed in formalin at 1:40 PM on 08/24/2016 cold ischemic time of less than 2 minutes Total fixation time: 6.7 Hours  B. The specimen is received in a formalin-filled container labeled with the patient's name and right breast #2 10:00, 5 cm from nipple.  Core pieces: multiple, aggregate 1.7 x 0.5 x 0.1 cm Comments: yellow to pink lobulated fibrofatty, marked black  Entirely submitted in cassette(s): 1  Time/Date in fixative: placed in formalin at 1:50 PM on 08/24/2016 cold ischemic time of less than 2 minutes Total fixation time: 6.6 hours    Final Diagnosis performed by Quay Burow, MD.  Electronically signed 08/25/2016 2:54:14PM   The electronic signature indicates that the named Attending Pathologist has  evaluated the specimen  Technical component performed at [facility], [address] [place], [place] [address] Lab: [phone] Dir: Gwyndolyn Saxon  Kem Kays, MD  Professional component perf                         ormed at [facility], [facility], [place], [place], [place] [address] Lab: [phone] Dir: Dellia Nims. Rubinas, MD   Breast Biomarker Reporting Template  BREAST BIOMARKER TESTS Estrogen Receptor (ER) Status: Positive, greater than 90%      Average intensity of staining: Strong Progesterone Receptor (PgR) Status: Positive, greater than 90%      Average intensity of staining: Strong HER2 (by immunohistochemistry): 2+ equivocal Percentage of cells with uniform intense complete membrane staining: 5% HER2 FISH pending.  METHODS Cold Ischemia and Fixation Times: Meet requirements specified in latest version of the ASCO/CAP guidelines Testing Performed on Block Number(s): A1 Fixative: Formalin Estrogen Receptor:  FDA cleared ([place])                    Primary Antibody:  SP1 Progesterone Receptor: FDA cleared ([place])                   Primary Antibody: 1E2 HER2 (by immunohistochemistry): FDA approved ([place])                             Prima                         ry Antibody: HercepTest  Immunohistochemistry controls worked appropriately.  Immunohistochemistry controls worked appropriately. Slides were prepared by [facility] Molecular Biology and Pathology, [place], [place], and interpreted by Quay Burow, MD.     Addendum #1 performed by Quay Burow, MD.  Electronically signed 08/30/2016 4:33:51PM    Technical component performed at [facility], [address], [place], [place] [address] Lab: [phone] Dir: Darrick Penna. Evette Doffing, MD  Professional component performed at [facility], [facility], [place], [place], [place] [address] Lab: [phone] Dir: Dellia Nims. Rubinas, MD      Assessment:  Allison Hall is a 76  y.o. female with multi-focal right breast cancer s/p partial mastectomy with sentinel lymph node biopsy on 09/29/2016.  Pathology is pending.   Ultrasound guided core needle biopsy on 08/24/2016 revealed a grade II invasive mammary carcinoma diffusely infiltrative with lobular-like features at the 9:30 position. 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  Tumor was ER positive (> 90%) and PR positive( > 90%) and HER-2/neu 2+ by IHC (FISH negative).  Bilateral screening mammogram on 07/24/2016 revealed a possible area of distortion in the right breast. Diagnostic mammogram on 08/10/2016 revealed 2 suspicious masses in the upper-outer quadrant of the right breast in a broad area of confluent distortion in the upper-outer quadrant of the right breast.  There are at least 3 areas of confluent distortion spanning approximately 6.5 cm of tissue.  Ultrasound on 07/31/2016 revealed  an irregular hypoechoic 1.2 x 1.1 x 1.2 cm at the 9:30 position 7 cm from the nipple.  In addition, there was an irregular 1.9 x 0.8 x 1.2 cm mass at the 10 o'clock position 5 cm from the nipple.  There were multiple small normal-appearing lymph nodes. CA27.29 was 13.4 on 09/06/2016.  Breast MRI on 09/16/2016 revealed an irregular 1.6 x 1.2 x 1.6 cm enhancing mass adjacent to the clip artifact within the upper, outer right breast, middle depth, corresponding to the mass seen  at 10 o'clock. There was an additional irregular enhancing mass adjacent to the more posterior clip artifact measuring 2.6 x 1.4 x 1.6 cm, corresponding  to the mass seen at 9:30 position. Together, these 2 masses spanned 6.3 cm of breast tissue from anterior to posterior. There was no definite additional suspicious area of enhancement within the right breast to correspond to the third area of distortion identified on diagnostic mammogram dated 08/10/2016.  Left breast revealed no suspicious mass or  enhancement.  Bone density study on 09/30/2015 revealed osteopenia with a T-score of -2.1 in the right femoral neck.    Symptomatically, she is doing well.  She denies any pain.  Plan: 1.  Discuss interval breast MRI and surgery.  Await pathology. 2.  Discuss Mammoprint.   3.  Call patient with pathology results. 4.  Preauth Prolia. 5.  Consult radiation oncology in 1-2 weeks. 6.  RTC after Mammoprint returns.   Lequita Asal, MD  10/04/2016, 12:58 PM

## 2016-10-04 NOTE — Progress Notes (Signed)
Patient is status post surgery on her right breast.  States she has had no pain.  She is eating well.  BP elevated today.  States it has been running a little higher.  PCP is aware and has adjusted medications. Patient states she did not sleep well last night and was anxious about appointment today and feels that may be why her BP is up today.

## 2016-10-04 NOTE — Patient Instructions (Signed)

## 2016-10-09 HISTORY — PX: BREAST LUMPECTOMY: SHX2

## 2016-10-16 DIAGNOSIS — C50911 Malignant neoplasm of unspecified site of right female breast: Secondary | ICD-10-CM | POA: Diagnosis not present

## 2016-10-16 DIAGNOSIS — I10 Essential (primary) hypertension: Secondary | ICD-10-CM | POA: Diagnosis not present

## 2016-10-16 DIAGNOSIS — F419 Anxiety disorder, unspecified: Secondary | ICD-10-CM | POA: Diagnosis not present

## 2016-10-17 ENCOUNTER — Encounter: Payer: Self-pay | Admitting: Hematology and Oncology

## 2016-10-18 ENCOUNTER — Encounter: Payer: Self-pay | Admitting: Radiation Oncology

## 2016-10-18 ENCOUNTER — Ambulatory Visit
Admission: RE | Admit: 2016-10-18 | Discharge: 2016-10-18 | Disposition: A | Discharge status: Home / Self Care | Payer: PPO | Source: Ambulatory Visit | Attending: Radiation Oncology | Admitting: Radiation Oncology

## 2016-10-18 ENCOUNTER — Inpatient Hospital Stay (HOSPITAL_BASED_OUTPATIENT_CLINIC_OR_DEPARTMENT_OTHER): Payer: PPO | Admitting: Hematology and Oncology

## 2016-10-18 VITALS — BP 148/87 | HR 91 | Temp 97.0°F | Wt 202.7 lb

## 2016-10-18 VITALS — BP 132/81 | HR 89 | Temp 99.1°F | Resp 18 | Wt 204.1 lb

## 2016-10-18 DIAGNOSIS — E7800 Pure hypercholesterolemia, unspecified: Secondary | ICD-10-CM | POA: Insufficient documentation

## 2016-10-18 DIAGNOSIS — Z86010 Personal history of colonic polyps: Secondary | ICD-10-CM | POA: Insufficient documentation

## 2016-10-18 DIAGNOSIS — F329 Major depressive disorder, single episode, unspecified: Secondary | ICD-10-CM | POA: Insufficient documentation

## 2016-10-18 DIAGNOSIS — Z170 Estrogen receptor positive status [ER+]: Secondary | ICD-10-CM | POA: Diagnosis not present

## 2016-10-18 DIAGNOSIS — F419 Anxiety disorder, unspecified: Secondary | ICD-10-CM | POA: Insufficient documentation

## 2016-10-18 DIAGNOSIS — C50411 Malignant neoplasm of upper-outer quadrant of right female breast: Secondary | ICD-10-CM

## 2016-10-18 DIAGNOSIS — M1990 Unspecified osteoarthritis, unspecified site: Secondary | ICD-10-CM | POA: Insufficient documentation

## 2016-10-18 DIAGNOSIS — Z9011 Acquired absence of right breast and nipple: Secondary | ICD-10-CM | POA: Diagnosis not present

## 2016-10-18 DIAGNOSIS — M8580 Other specified disorders of bone density and structure, unspecified site: Secondary | ICD-10-CM

## 2016-10-18 DIAGNOSIS — K219 Gastro-esophageal reflux disease without esophagitis: Secondary | ICD-10-CM | POA: Insufficient documentation

## 2016-10-18 DIAGNOSIS — Z87891 Personal history of nicotine dependence: Secondary | ICD-10-CM | POA: Insufficient documentation

## 2016-10-18 DIAGNOSIS — Z510 Encounter for antineoplastic radiation therapy: Secondary | ICD-10-CM | POA: Insufficient documentation

## 2016-10-18 DIAGNOSIS — Z79899 Other long term (current) drug therapy: Secondary | ICD-10-CM | POA: Insufficient documentation

## 2016-10-18 DIAGNOSIS — I10 Essential (primary) hypertension: Secondary | ICD-10-CM | POA: Insufficient documentation

## 2016-10-18 NOTE — Progress Notes (Signed)
[place] [facility] day:  10/18/2016  Chief Complaint: Allison Hall is a 76 y.o. female with right breast cancer who is seen for assessment after partial mastectomy and interval MammoPrint results.  HPI:  The patient was last seen in the medical oncology clinic on 10/04/2016.  At that time, she was seen for assessment after partial mastectomy.  Pathology was pending.  We discussed sending tissue for Mammoprint.  She underwent partial mastectomy with sentinel lymph node biopsy on 09/29/2016 by Dr. Rochel Brome.  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  Three sentinel lymph nodes were negative.  Tumor was ER + (> 90%), PR + (> 90%) and Her2/neu 2+ (negative by FISH).  Pathologic stage was T1c(m) N0(sn).  The two breast excisions shared a common border, with the posterior edge of specimen #1 in continuity with the anterior edge of the specimen #2. Thus, all of the breast tissue between the two lesions was excised. They were separate lesions although identical histologically.   MammaPrint revealed a high risk of recurrence (29%).  Treatment with chemotherapy and hormonal therapy based on the MINDACT trial resulted in a 94.6% distant metastasis free interval (DMFI) at 5 years.  She met with radiation oncology yesterday.  Plan is to deliver 5040 cGy in 28 fractions boosting her scar to another 1400 cGy using electron beam.  Symptomatically, she feels well.  She denies any complaint.   Past Medical History:  Diagnosis Date  . Anxiety   . Cancer ([place])    skin ca  . Colon polyp   . Depression   . GERD (gastroesophageal reflux disease)   . Hypercholesteremia   . Hypertension   . Osteoarthritis     Past Surgical History:  Procedure Laterality Date  . ABDOMINAL HYSTERECTOMY    . APPENDECTOMY    . JOINT REPLACEMENT    . NOSE SURGERY    . PARTIAL  MASTECTOMY WITH NEEDLE LOCALIZATION Right 09/29/2016   Procedure: PARTIAL MASTECTOMY WITH NEEDLE LOCALIZATION;  Surgeon: [name], MD;  Location: [facility] ORS;  Service: General;  Laterality: Right;  . REPLACEMENT TOTAL KNEE Right 2008  . SENTINEL NODE BIOPSY Right 09/29/2016   Procedure: SENTINEL NODE BIOPSY;  Surgeon: [name], MD;  Location: [facility] ORS;  Service: General;  Laterality: Right;    Family History  Problem Relation Age of Onset  . Hypertension Mother   . Depression Mother   . Heart failure Father   . Obesity Brother   . Heart disease Brother   . Stroke Brother   . Depression Brother   . Breast cancer Cousin        2 mat and 1 pat cousins    Social History:  reports that she quit smoking about 51 years ago. She has a 10.00 pack-year smoking history. She has never used smokeless tobacco. She reports that she drinks about 2.4 oz of alcohol per week . She reports that she does not use drugs.  She previously smoked 1/2 pack/day x 10 years.  She stopped smoking on 10/11/1978.  She drinks 6 glasses of wine/week.  She has 3 children (2 sons and 1 daughter).  She is a retired Clinical cytogeneticist for [employer].  She likes to do crossword puzzles.  Her daughter's name is [name].  She lives in [place].  She is alone today.  Allergies:  Allergies  Allergen Reactions  .  Sulfamethoxazole Other (See Comments)    Anxiety and imsomnia  . Trazodone Other (See Comments)    Pt states she felt "unsteady"   . Ciprofloxacin Anxiety  . Sulfa Antibiotics Anxiety    Current Medications: Current Outpatient Prescriptions  Medication Sig Dispense Refill  . acetaminophen (TYLENOL) 500 MG tablet Take 500 mg by mouth 2 (two) times daily.    Marland Kitchen amLODipine (NORVASC) 2.5 MG tablet Take by mouth.    Marland Kitchen amLODipine (NORVASC) 5 MG tablet Take 5 mg by mouth daily.     Marland Kitchen buPROPion (WELLBUTRIN SR) 100 MG 12 hr tablet Take 1 tablet (100 mg total) by mouth daily. (Patient taking differently: Take 200 mg by  mouth daily. ) 30 tablet 1  . busPIRone (BUSPAR) 10 MG tablet Take 10 mg by mouth 3 (three) times daily.    . Cholecalciferol (VITAMIN D) 2000 units CAPS Take 4,000 Units by mouth daily.    Marland Kitchen lisinopril (PRINIVIL,ZESTRIL) 20 MG tablet Take 30 mg by mouth daily. Takes 1.5 tablet    . loperamide (IMODIUM) 2 MG capsule Take 2 mg by mouth as needed for diarrhea or loose stools.     . Magnesium 250 MG TABS Take 2 tablets by mouth daily.    . Omega-3 Fatty Acids (FISH OIL) 1000 MG CAPS Take 3,000 mg by mouth daily.     Marland Kitchen omeprazole (PRILOSEC) 40 MG capsule Take 20 mg by mouth.     . rosuvastatin (CRESTOR) 5 MG tablet Take 5 mg by mouth daily.    . sertraline (ZOLOFT) 100 MG tablet Take 1.5 tablets (150 mg total) by mouth daily. (Patient taking differently: Take 100-150 mg by mouth See admin instructions. Takes 100 mg daily except on Monday, Wednesday and Fridays and takes 150 mg (1.5 tablet)) 45 tablet 1  . Turmeric Curcumin 500 MG CAPS Take 1,500 mg by mouth daily.     Marland Kitchen UNABLE TO FIND Take 1,000 mg by mouth 3 (three) times daily. Hemp Oil capsules 1000 mg each Also contains Omega 3 and Omega 6 Fatty Acids     No current facility-administered medications for this visit.     Review of Systems:  GENERAL:  Feels fine.  No fevers or sweats.  Weight up 2 pounds. PERFORMANCE STATUS (ECOG):  0 HEENT:  No visual changes, runny nose, sore throat, mouth sores or tenderness. Lungs: No shortness of breath or cough.  No hemoptysis. Cardiac:  No chest pain, palpitations, orthopnea, or PND. GI:  No nausea, vomiting, diarrhea, constipation, melena or hematochezia. GU:  No urgency, frequency, dysuria, or hematuria. Musculoskeletal:  No back pain.  "Left knee bone on bone".  No muscle tenderness. Extremities:  No pain or swelling. Skin:  Healing well s/p partial mastectomy.  No rashes or skin changes. Neuro:  Poor balance.  No headache, numbness or weakness, or coordination issues. Endocrine:  No diabetes or  thyroid issues.  Some sweats and hot flashes with the weather. Psych:  No mood changes, depression or anxiety. Pain:  No focal pain. Review of systems:  All other systems reviewed and found to be negative.  Physical Exam: Blood pressure 132/81, pulse 89, temperature 99.1 F (37.3 C), temperature source Tympanic, resp. rate 18, weight 204 lb 2.3 oz (92.6 kg). GENERAL:  Well developed, well nourished, woman sitting comfortably in the exam room in no acute distress. MENTAL STATUS:  Alert and oriented to person, place and time. HEAD:  Short brown styled hair.  Normocephalic, atraumatic, face symmetric, no Cushingoid features.  EYES:  Glasses.  Green eyes.  No conjunctivitis or scleral icterus. BREAST:  Right breast with well healed incision.  Post-operative 10 x 7 cm hematoma/seroma.  No skin changes or nipple discharge.  SKIN:  No rashes, ulcers or lesions. EXTREMITIES: No edema, no skin discoloration or tenderness. LYMPH NODES: No palpable cervical, supraclavicular, or axillary adenopathy.  Well healed right axillary incision. NEUROLOGICAL: Unremarkable. PSYCH:  Appropriate.   No visits with results within 3 Day(s) from this visit.  Latest known visit with results is:  Hospital Outpatient Visit on 08/24/2016  Component Date Value Ref Range Status  . SURGICAL PATHOLOGY 08/24/2016    Final-Edited                   Value:Surgical Pathology THIS IS AN ADDENDUM REPORT CASE: [id number] PATIENT: [facility] Surgical Pathology Report Addendum  Reason for Addendum #1:  Immunohistochemistry results  SPECIMEN SUBMITTED: A. Breast, right, 9:30 B. Breast, right, 10:00  CLINICAL HISTORY: Masses  PRE-OPERATIVE DIAGNOSIS: IMC, possibly lobular  POST-OPERATIVE DIAGNOSIS: None provided.     DIAGNOSIS: A. BREAST, RIGHT 9:30; ULTRASOUND GUIDED BIOPSY: - INVASIVE MAMMARY CARCINOMA, DIFFUSELY INFILTRATIVE WITH LOBULAR-LIKE FEATURES.  Size of invasive carcinoma: 6 mm in this  sample Histologic grade of invasive carcinoma: 2      Glandular/tubular differentiation score: 3      Nuclear pleomorphism score: 2      Mitotic rate score: 1      Total score: 6  Ductal carcinoma in situ: Not identified Lymphovascular invasion: Not identified  B. Breast, right 10:00; ULTRASOUND GUIDED BIOPSY: - INVASIVE MAMMARY CARCINOMA, WITH LOBULAR FEATURES.  Size                          of invasive carcinoma: 8 mm in this sample Histologic grade of invasive carcinoma: 2      Glandular/tubular differentiation score: 3      Nuclear pleomorphism score: 2      Mitotic rate score: 1      Total score: 6  Ductal carcinoma in situ: Not identified Lymphovascular invasion: Not identified  Comment: The definitive grade will be assigned on the excisional specimen. The carcinoma is the two biopsy specimens have identical morphology. ER/PR/HER2: Immunohistochemistry will be performed on block A1, with reflex to [place] for HER2 2+. The results will be reported in an addendum. These findings were communicated to [place] in Dr. Janace Hoard office on 08/25/2016. Read back procedure was performed.     GROSS DESCRIPTION:  A. The specimen is received in a formalin-filled container labeled with the patient's name and right breast 9:30, 7 cm from nipple.  Core pieces: multiple, aggregate 1.6 x 0.4 x 0.1 cm Comments: red yellow lobulated fibrofatty tissue, ma                         rked green  Entirely submitted in cassette(s): 1  Time/Date in fixative: placed in formalin at 1:40 PM on 08/24/2016 cold ischemic time of less than 2 minutes Total fixation time: 6.7 Hours  B. The specimen is received in a formalin-filled container labeled with the patient's name and right breast #2 10:00, 5 cm from nipple.  Core pieces: multiple, aggregate 1.7 x 0.5 x 0.1 cm Comments: yellow to pink lobulated fibrofatty, marked black  Entirely submitted in cassette(s): 1  Time/Date in  fixative: placed in formalin at 1:50 PM on 08/24/2016 cold ischemic time of less than 2  minutes Total fixation time: 6.6 hours    Final Diagnosis performed by Quay Burow, MD.  Electronically signed 08/25/2016 2:54:14PM   The electronic signature indicates that the named Attending Pathologist has evaluated the specimen  Technical component performed at [facility], [address] [place], [place] [address] Lab: [phone] Dir: Darrick Penna. Evette Doffing, MD  Professional component perf                         ormed at [facility], [facility], [place], [place], [place] [address] Lab: [phone] Dir: Dellia Nims. Rubinas, MD   Breast Biomarker Reporting Template  BREAST BIOMARKER TESTS Estrogen Receptor (ER) Status: Positive, greater than 90%      Average intensity of staining: Strong Progesterone Receptor (PgR) Status: Positive, greater than 90%      Average intensity of staining: Strong HER2 (by immunohistochemistry): 2+ equivocal Percentage of cells with uniform intense complete membrane staining: 5% HER2 FISH pending.  METHODS Cold Ischemia and Fixation Times: Meet requirements specified in latest version of the ASCO/CAP guidelines Testing Performed on Block Number(s): A1 Fixative: Formalin Estrogen Receptor:  FDA cleared ([place])                    Primary Antibody:  SP1 Progesterone Receptor: FDA cleared ([place])                   Primary Antibody: 1E2 HER2 (by immunohistochemistry): FDA approved ([place])                             Prima                         ry Antibody: HercepTest  Immunohistochemistry controls worked appropriately.  Immunohistochemistry controls worked appropriately. Slides were prepared by [facility], [place], [place], and interpreted by Quay Burow, MD.     Addendum #1 performed by Quay Burow, MD.  Electronically signed 08/30/2016 4:33:51PM    Technical component performed at [place],  [address] [place], [place] [address] Lab: [phone] Dir: Darrick Penna. Evette Doffing, MD  Professional component performed at [facility], [facility], [place], [place],  [address] Lab: [phone] Dir: Dellia Nims. Rubinas, MD      Assessment:  Allison Hall is a 76 y.o. female with multi-focal right breast cancer s/p partial mastectomy with sentinel lymph node biopsy on 09/29/2016.  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  Three sentinel lymph nodes were negative.  Tumor was ER + (> 90%), PR + (> 90%) and Her2/neu 2+ (negative by FISH).  Pathologic stage was T1c(m) N0(sn).  MammaPrint revealed a 29% risk of recurrence (high).  Bilateral screening mammogram on 07/24/2016 revealed a possible area of distortion in the right breast. Diagnostic mammogram on 08/10/2016 revealed 2 suspicious masses in the upper-outer quadrant of the right breast in a broad area of confluent distortion in the upper-outer quadrant of the right breast.  There are at least 3 areas of confluent distortion spanning approximately 6.5 cm of tissue.  Ultrasound on 07/31/2016 revealed  an irregular hypoechoic 1.2 x 1.1 x 1.2 cm at the 9:30 position 7 cm from the nipple.  In addition, there was an irregular 1.9 x 0.8 x 1.2 cm mass at the 10 o'clock position 5 cm  from the nipple.  There were multiple small normal-appearing lymph nodes. CA27.29 was 13.4 on 09/06/2016.  Breast MRI on 09/16/2016 revealed an irregular 1.6 x 1.2 x 1.6 cm enhancing mass adjacent to the clip artifact within the upper, outer right breast, middle depth, corresponding to the mass seen at 10 o'clock. There was an additional irregular enhancing mass adjacent to the more posterior clip artifact measuring 2.6 x 1.4 x 1.6 cm, corresponding  to the mass seen at 9:30 position. Together, these 2 masses span 6.3 cm of breast tissue  from anterior to posterior. There was no definite additional suspicious area of enhancement within the right breast to correspond to the third area of distortion identified on diagnostic mammogram dated 08/10/2016.  Left breast revealed no suspicious mass or enhancement.  Bone density study on 09/30/2015 revealed osteopenia with a T-score of -2.1 in the right femoral neck.  Symptomatically, she is doing well.  Exam reveals post-operative changes.  Plan: 1.  Discuss pathology.  Disease was multi-focal with identical pathologies.  Tumors were grade I, invasive carcinoma with tubulo-lobular variant.  Sentinel lymph nodes were negative. 2.  Discuss Mammoprint results.  Copy of report for patient.  Discuss surprise by high risk of recurrence.  Patient will receive radiation post partial mastectomy and hormonal therapy (aromatase inhibitor).  Discuss consideration of chemotherapy.  Treatment is individualized for patients over 70.  She is in good health.  Discuss consideration of TC x 4.  Side effects reviewed.  Discuss CMF (1st generation chemotherapy).  Discuss second opinion at [facility]. 3.  Second opinion with Dr. Lucile Crater ([facility])- MD to call. 4.  RTC after second opinion (patient to call).   Lequita Asal, MD  10/18/2016, 2:46 PM

## 2016-10-18 NOTE — Progress Notes (Signed)
Patient offers no complaints today. 

## 2016-10-18 NOTE — Consult Note (Signed)
NEW PATIENT EVALUATION  Name: Allison Hall  MRN: [id number]  Date:   10/18/2016     DOB: 04/08/1940   This 76 y.o. 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  REFERRING PHYSICIAN: Clarisse Gouge, MD  CHIEF COMPLAINT:  Chief Complaint  Patient presents with  . Breast Cancer    initial eval    DIAGNOSIS: The encounter diagnosis was Malignant neoplasm of upper-outer quadrant of right breast in female, estrogen receptor positive ([place]).   PREVIOUS INVESTIGATIONS:  Pathology reports and MammaPrint report reviewed Clinical notes reviewed Mammogram ultrasound MRI scans reviewed  HPI: Patient is a 76 year old female who presented with 2 separate lesions suspicious for malignancy in the upper outer quadrant of the right breast and 8 "brought area of confluent distortion. This was confirmed on ultrasound and biopsy of both masses in the 9:30 and 10:00 position were performed. MRI was performed showing 2 separate masses in the upper outer quadrant of the right breast separated by proximal a 5.2 cm. The 2 masses were 1.6 cm in greatest dimension and 2.6 cm in greatest dimension. The 9:30 lesion was a overall grade 1 invasive carcinoma with lobular like features. The lesion at 10:00 was also grade 1 invasive mammary carcinoma with lobular features. Patient went on to have a wide local excision and sentinel node biopsy. The right upper outer quadrant lesion was a 1.5 cm invasive mammary carcinoma with tubulo-lobular variant margins clear. The second lesion more posterior in the upper outer quadrant was also invasive carcinoma to be low lobular variant 1.6 cm also grade 1. 2 sentinel lymph nodes were negative for metastatic disease. The 2 breast excision shared a common border with the posterior edge being contiguous with the anterior edge of the  specimen the 2 lesions were separate although identical histologically. Again tumor was strongly ER/PR positive HER-2/neu negative. Patient had MammaPrint performed showing a high risk for recurrence. There was a 29% of recurrence risk if left untreated at 10 years. Patient has done well postoperatively. She is seeing medical oncologist afternoon for discussion of possible chemotherapy. She specifically denies breast tenderness cough or bone pain. She has had some fluid draining from her seroma 1.  PLANNED TREATMENT REGIMEN: Whole breast radiation possible systemic chemotherapy  PAST MEDICAL HISTORY:  has a past medical history of Anxiety; Cancer ([place]); Colon polyp; Depression; GERD (gastroesophageal reflux disease); Hypercholesteremia; Hypertension; and Osteoarthritis.    PAST SURGICAL HISTORY:  Past Surgical History:  Procedure Laterality Date  . ABDOMINAL HYSTERECTOMY    . APPENDECTOMY    . JOINT REPLACEMENT    . NOSE SURGERY    . PARTIAL MASTECTOMY WITH NEEDLE LOCALIZATION Right 09/29/2016   Procedure: PARTIAL MASTECTOMY WITH NEEDLE LOCALIZATION;  Surgeon: [name], MD;  Location: [facility] ORS;  Service: General;  Laterality: Right;  . REPLACEMENT TOTAL KNEE Right 2008  . SENTINEL NODE BIOPSY Right 09/29/2016   Procedure: SENTINEL NODE BIOPSY;  Surgeon: [name], MD;  Location: [facility] ORS;  Service: General;  Laterality: Right;    FAMILY HISTORY: family history includes Breast cancer in her cousin; Depression in her brother and mother; Heart disease in her brother; Heart failure in her father; Hypertension in her mother; Obesity in her brother; Stroke in her brother.  SOCIAL HISTORY:  reports that she quit smoking about 51 years ago. She has a 10.00 pack-year  smoking history. She has never used smokeless tobacco. She reports that she drinks about 2.4 oz of alcohol per week . She reports that she does not use drugs.  ALLERGIES: Sulfamethoxazole; Trazodone; Ciprofloxacin; and  Sulfa antibiotics  MEDICATIONS:  Current Outpatient Prescriptions  Medication Sig Dispense Refill  . amLODipine (NORVASC) 2.5 MG tablet Take by mouth.    Marland Kitchen acetaminophen (TYLENOL) 500 MG tablet Take 500 mg by mouth 2 (two) times daily.    Marland Kitchen amLODipine (NORVASC) 5 MG tablet Take 5 mg by mouth daily.     Marland Kitchen buPROPion (WELLBUTRIN SR) 100 MG 12 hr tablet Take 1 tablet (100 mg total) by mouth daily. (Patient taking differently: Take 200 mg by mouth daily. ) 30 tablet 1  . busPIRone (BUSPAR) 10 MG tablet Take 10 mg by mouth 3 (three) times daily.    . Cholecalciferol (VITAMIN D) 2000 units CAPS Take 4,000 Units by mouth daily.    Marland Kitchen lisinopril (PRINIVIL,ZESTRIL) 20 MG tablet Take 30 mg by mouth daily. Takes 1.5 tablet    . loperamide (IMODIUM) 2 MG capsule Take 2 mg by mouth as needed for diarrhea or loose stools.     . Magnesium 250 MG TABS Take 2 tablets by mouth daily.    . Omega-3 Fatty Acids (FISH OIL) 1000 MG CAPS Take 3,000 mg by mouth daily.     Marland Kitchen omeprazole (PRILOSEC) 40 MG capsule Take 20 mg by mouth.     . rosuvastatin (CRESTOR) 5 MG tablet Take 5 mg by mouth daily.    . sertraline (ZOLOFT) 100 MG tablet Take 1.5 tablets (150 mg total) by mouth daily. (Patient taking differently: Take 100-150 mg by mouth See admin instructions. Takes 100 mg daily except on Monday, Wednesday and Fridays and takes 150 mg (1.5 tablet)) 45 tablet 1  . Turmeric Curcumin 500 MG CAPS Take 1,500 mg by mouth daily.     Marland Kitchen UNABLE TO FIND Take 1,000 mg by mouth 3 (three) times daily. Hemp Oil capsules 1000 mg each Also contains Omega 3 and Omega 6 Fatty Acids     No current facility-administered medications for this encounter.     ECOG PERFORMANCE STATUS:  0 - Asymptomatic  REVIEW OF SYSTEMS:  Patient denies any weight loss, fatigue, weakness, fever, chills or night sweats. Patient denies any loss of vision, blurred vision. Patient denies any ringing  of the ears or hearing loss. No irregular heartbeat. Patient  denies heart murmur or history of fainting. Patient denies any chest pain or pain radiating to her upper extremities. Patient denies any shortness of breath, difficulty breathing at night, cough or hemoptysis. Patient denies any swelling in the lower legs. Patient denies any nausea vomiting, vomiting of blood, or coffee ground material in the vomitus. Patient denies any stomach pain. Patient states has had normal bowel movements no significant constipation or diarrhea. Patient denies any dysuria, hematuria or significant nocturia. Patient denies any problems walking, swelling in the joints or loss of balance. Patient denies any skin changes, loss of hair or loss of weight. Patient denies any excessive worrying or anxiety or significant depression. Patient denies any problems with insomnia. Patient denies excessive thirst, polyuria, polydipsia. Patient denies any swollen glands, patient denies easy bruising or easy bleeding. Patient denies any recent infections, allergies or URI. Patient "s visual fields have not changed significantly in recent time.    PHYSICAL EXAM: BP (!) 148/87   Pulse 91   Temp (!) 97 F (36.1 C)   Wt 202  lb 11.4 oz (92 kg)   BMI 33.73 kg/m  Patient is status post wide local excision of the right breast. Incision is well-healed still seems to be fluid present in her seroma. No other no other dominant mass or nodularity is noted in either breast in 2 positions examined. No axillary or supraclavicular adenopathy is appreciated. Well-developed well-nourished patient in NAD. HEENT reveals PERLA, EOMI, discs not visualized.  Oral cavity is clear. No oral mucosal lesions are identified. Neck is clear without evidence of cervical or supraclavicular adenopathy. Lungs are clear to A&P. Cardiac examination is essentially unremarkable with regular rate and rhythm without murmur rub or thrill. Abdomen is benign with no organomegaly or masses noted. Motor sensory and DTR levels are equal and  symmetric in the upper and lower extremities. Cranial nerves II through XII are grossly intact. Proprioception is intact. No peripheral adenopathy or edema is identified. No motor or sensory levels are noted. Crude visual fields are within normal range.  LABORATORY DATA: Pathology reports reviewed    RADIOLOGY RESULTS: Ultrasound mammogram and MRI scans reviewed   IMPRESSION: Stage I invasive mammary carcinoma of the right breast with 2 separate foci of identical variant invasive mammary carcinoma tubulo-lobular variant status post wide local excision and sentinel node biopsy ER/PR positive HER-2/neu not overexpressed  PLAN: At this time patient will have discussions afternoon with medical oncology about systemic chemotherapy. Should they decide on doing ahead with chemotherapy will reevaluate patient after completion of chemotherapy for whole breast radiation. If they decide not to go ahead with systemic treatment would set her up in about a week for simulation of her whole breast. Her breasts is rather large making hypofractionated course of treatment difficult. I would plan on delivering 5040 cGy in 28 fractions boosting her scar another 1400 cGy using electron beam. Risks and benefits of treatment including skin reaction fatigue alteration of blood counts possible inclusion of superficial lung hyperpigmentation the skin all were discussed in detail with the patient. She seems to comprehend my treatment plan well. I will wait for work for medical oncology before proceeding with simulation scheduling. Patient knows to call with any concerns. She will be a candidate for antiestrogen therapy after completion of radiation.  I would like to take this opportunity to thank you for allowing me to participate in the care of your patient.Armstead Peaks., MD

## 2016-10-19 ENCOUNTER — Encounter: Payer: Self-pay | Admitting: Hematology and Oncology

## 2016-10-20 ENCOUNTER — Telehealth: Payer: Self-pay | Admitting: *Deleted

## 2016-10-20 NOTE — Telephone Encounter (Signed)
Returned patients call and answered questions r/t referral to [facility] voiced understanding.

## 2016-10-23 ENCOUNTER — Telehealth: Payer: Self-pay | Admitting: *Deleted

## 2016-10-23 NOTE — Telephone Encounter (Signed)
Called the patient she voiced understanding. She will call Dr. Lorane Gell office now and if they do not have an appt for her I will call in the am.

## 2016-10-23 NOTE — Telephone Encounter (Signed)
  Please call patient.  Dr Beverley Fiedler said he would see her this Wednesday.  Please call Marcum's office and make sure appt is set.  He will discuss WHICH chemo to give her based on her age.  M

## 2016-10-23 NOTE — Telephone Encounter (Signed)
Patient spoke with Dr. Rochel Brome this am and he told her that it should be treated aggressively.  Patient has not heard from Dr. Paulo Fruit office at [facility] yet and would like to go ahead with agressive chemo treatment, as she is high risk.  The only way the patient thinks it is necessary to see Dr. Juanita Craver is he is going to offer something other than chemo and she wouldn't have to take the chemo at all.  Please advise.

## 2016-10-23 NOTE — Telephone Encounter (Signed)
Patient called to inform Dr. Mike Gip that she has changed her mind about receiving chemotherapy. She now would like to be aggressive. She is no longer pursuing a second opinion.

## 2016-10-24 NOTE — Telephone Encounter (Signed)
Patient called [facility] and she sees Dr. Juanita Craver tomorrow Wednesday

## 2016-10-25 DIAGNOSIS — C50411 Malignant neoplasm of upper-outer quadrant of right female breast: Secondary | ICD-10-CM | POA: Diagnosis not present

## 2016-10-25 DIAGNOSIS — Z170 Estrogen receptor positive status [ER+]: Secondary | ICD-10-CM | POA: Diagnosis not present

## 2016-10-29 ENCOUNTER — Ambulatory Visit
Admission: EM | Admit: 2016-10-29 | Discharge: 2016-10-29 | Disposition: A | Discharge status: Home / Self Care | Payer: PPO | Attending: Family Medicine | Admitting: Family Medicine

## 2016-10-29 ENCOUNTER — Ambulatory Visit (INDEPENDENT_AMBULATORY_CARE_PROVIDER_SITE_OTHER): Payer: PPO

## 2016-10-29 DIAGNOSIS — S93401A Sprain of unspecified ligament of right ankle, initial encounter: Secondary | ICD-10-CM

## 2016-10-29 DIAGNOSIS — S92354A Nondisplaced fracture of fifth metatarsal bone, right foot, initial encounter for closed fracture: Secondary | ICD-10-CM | POA: Diagnosis not present

## 2016-10-29 DIAGNOSIS — W19XXXA Unspecified fall, initial encounter: Secondary | ICD-10-CM

## 2016-10-29 DIAGNOSIS — S99921A Unspecified injury of right foot, initial encounter: Secondary | ICD-10-CM | POA: Diagnosis not present

## 2016-10-29 DIAGNOSIS — M79671 Pain in right foot: Secondary | ICD-10-CM | POA: Diagnosis not present

## 2016-10-29 NOTE — Discharge Instructions (Signed)
Take home medication as needed, as discussed. Keep in splint. Use walker. Ice and elevate.   Follow up with podiatry this week. Call tomorrow to schedule.   Follow up with your primary care physician this week as needed. Return to Urgent care for new or worsening concerns.

## 2016-10-29 NOTE — ED Triage Notes (Signed)
As per patient fell at house 30 min ago around 11:30 am and right side of foot swollen and painful gradually pain is getting worst don't know if fractured or sprain.

## 2016-10-29 NOTE — ED Provider Notes (Signed)
[facility] ____________________________________________  Time seen: Approximately 12:35 PM  I have reviewed the triage vital signs and the nursing notes.   HISTORY  Chief Complaint Foot Pain (Right)   HPI Allison Hall is a 76 y.o. female presents for evaluation of right ankle and right foot pain after injury that occurred at approximately 11 AM this morning. Patient reports that she was sitting in a chair, stating she had flip flops on, and when she went to stand up the end of the flip flop went underneath her foot, causing her to trip and fall forward. Patient states that she landed on right foot, knees and then slid forward. Denies head injury or loss consciousness. Reports has been able to apply some weight, but pain with any weightbearing. States pain present only to right foot and right ankle. Patient states minimal pain at this time. Patient states that she's keeps foot and ankle still she is not hurting. Pain mostly with weightbearing. Denies previous injuries to the same areas. Denies paresthesias, pain radiation or other injury. Patient presents that she does have breast cancer and is scheduled to begin radiation therapy next week. Patient states fell only due to tripping over for fall. Denies syncope or near syncope.Denies chest pain, shortness of breath, abdominal pain, dysuria,  or rash. Denies recent sickness. Denies recent antibiotic use.   Clarisse Gouge, MD: PCP   Past Medical History:  Diagnosis Date  . Anxiety   . Cancer ([place])    skin ca  . Colon polyp   . Depression   . GERD (gastroesophageal reflux disease)   . Hypercholesteremia   . Hypertension   . Osteoarthritis     Patient Active Problem List   Diagnosis Date Noted  . Osteopenia 10/04/2016  . Breast cancer of upper-outer quadrant of right female breast ([place]) 08/24/2016  . Closed fracture of distal end of ulna 06/01/2015  . Triggering of digit 05/27/2014  . Hamstring muscle strain  05/26/2014  . Arthritis of knee, degenerative 03/26/2014  . Long term current use of opiate analgesic 04/21/2013  . Arthritis, degenerative 04/21/2013  . Anxiety 10/05/2011  . Colon polyp 10/05/2011  . Esophageal foreign body 10/05/2011  . Acid reflux 10/05/2011  . BP (high blood pressure) 10/05/2011  . Hypercholesterolemia 10/05/2011    Past Surgical History:  Procedure Laterality Date  . ABDOMINAL HYSTERECTOMY    . APPENDECTOMY    . JOINT REPLACEMENT    . NOSE SURGERY    . PARTIAL MASTECTOMY WITH NEEDLE LOCALIZATION Right 09/29/2016   Procedure: PARTIAL MASTECTOMY WITH NEEDLE LOCALIZATION;  Surgeon: [name], MD;  Location: [facility] ORS;  Service: General;  Laterality: Right;  . REPLACEMENT TOTAL KNEE Right 2008  . SENTINEL NODE BIOPSY Right 09/29/2016   Procedure: SENTINEL NODE BIOPSY;  Surgeon: [name], MD;  Location: [facility] ORS;  Service: General;  Laterality: Right;     No current facility-administered medications for this encounter.   Current Outpatient Prescriptions:  .  acetaminophen (TYLENOL) 500 MG tablet, Take 500 mg by mouth 2 (two) times daily., Disp: , Rfl:  .  amLODipine (NORVASC) 2.5 MG tablet, Take by mouth., Disp: , Rfl:  .  busPIRone (BUSPAR) 10 MG tablet, Take 10 mg by mouth 3 (three) times daily., Disp: , Rfl:  .  Cholecalciferol (VITAMIN D) 2000 units CAPS, Take 4,000 Units by mouth daily., Disp: , Rfl:  .  loperamide (IMODIUM) 2 MG capsule, Take 2 mg by mouth as needed for diarrhea or  loose stools. , Disp: , Rfl:  .  Magnesium 250 MG TABS, Take 2 tablets by mouth daily., Disp: , Rfl:  .  Omega-3 Fatty Acids (FISH OIL) 1000 MG CAPS, Take 3,000 mg by mouth daily. , Disp: , Rfl:  .  rosuvastatin (CRESTOR) 5 MG tablet, Take 5 mg by mouth daily., Disp: , Rfl:  .  Turmeric Curcumin 500 MG CAPS, Take 1,500 mg by mouth daily. , Disp: , Rfl:  .  UNABLE TO FIND, Take 1,000 mg by mouth 3 (three) times daily. Hemp Oil capsules 1000 mg each Also  contains Omega 3 and Omega 6 Fatty Acids, Disp: , Rfl:  .  amLODipine (NORVASC) 5 MG tablet, Take 5 mg by mouth daily. , Disp: , Rfl:  .  buPROPion (WELLBUTRIN SR) 100 MG 12 hr tablet, Take 1 tablet (100 mg total) by mouth daily. (Patient taking differently: Take 200 mg by mouth daily. ), Disp: 30 tablet, Rfl: 1 .  lisinopril (PRINIVIL,ZESTRIL) 20 MG tablet, Take 30 mg by mouth daily. Takes 1.5 tablet, Disp: , Rfl:  .  omeprazole (PRILOSEC) 40 MG capsule, Take 20 mg by mouth. , Disp: , Rfl:  .  sertraline (ZOLOFT) 100 MG tablet, Take 1.5 tablets (150 mg total) by mouth daily. (Patient taking differently: Take 100-150 mg by mouth See admin instructions. Takes 100 mg daily except on Monday, Wednesday and Fridays and takes 150 mg (1.5 tablet)), Disp: 45 tablet, Rfl: 1  Allergies Sulfamethoxazole; Trazodone; Ciprofloxacin; and Sulfa antibiotics  Family History  Problem Relation Age of Onset  . Hypertension Mother   . Depression Mother   . Heart failure Father   . Obesity Brother   . Heart disease Brother   . Stroke Brother   . Depression Brother   . Breast cancer Cousin        2 mat and 1 pat cousins    Social History Social History  Substance Use Topics  . Smoking status: Former Smoker    Packs/day: 1.00    Years: 10.00    Quit date: 05/18/1965  . Smokeless tobacco: Never Used  . Alcohol use 2.4 oz/week    4 Glasses of wine per week    Review of Systems Constitutional: No fever/chills Eyes: No visual changes. Cardiovascular: Denies chest pain. Respiratory: Denies shortness of breath. Gastrointestinal: No abdominal pain.   Musculoskeletal: Negative for back pain. As above.  Skin: Negative for rash.  ____________________________________________   PHYSICAL EXAM:  VITAL SIGNS: ED Triage Vitals  Enc Vitals Group     BP 10/29/16 1203 138/87     Pulse Rate 10/29/16 1203 85     Resp 10/29/16 1203 16     Temp 10/29/16 1203 98.2 F (36.8 C)     Temp Source 10/29/16 1203  Oral     SpO2 10/29/16 1203 99 %     Weight 10/29/16 1207 203 lb (92.1 kg)     Height 10/29/16 1207 5\' 5"  (1.651 m)     Head Circumference --      Peak Flow --      Pain Score 10/29/16 1201 1     Pain Loc --      Pain Edu? --      Excl. in [place]? --     Constitutional: Alert and oriented. Well appearing and in no acute distress. Eyes: Conjunctivae are normal.  ENT      Head: Normocephalic and atraumatic. Cardiovascular: Normal rate, regular rhythm. Grossly normal heart sounds.  Good peripheral  circulation. Respiratory: Normal respiratory effort without tachypnea nor retractions. Breath sounds are clear and equal bilaterally. No wheezes, rales, rhonchi. Gastrointestinal: Soft and nontender.  Musculoskeletal:  No midline cervical, thoracic or lumbar tenderness to palpation. Bilateral pedal pulses equal and easily palpated. Except: Right lateral malleolar area with mild to moderate tenderness or direct palpation, mild to moderate edema, mild ecchymosis, with slightly limited rotation, right lateral foot at base of fifth metatarsal moderate tenderness to direct palpation with moderate localized edema, right foot full range of motion present, pain with flexion and dorsiflexion, right foot distal sensation intact and normal distal capillary refill. Gait not tested due to pain. Neurologic:  Normal speech and language. No gross focal neurologic deficits are appreciated. Speech is normal.  Skin:  Skin is warm, dry Psychiatric: Mood and affect are normal. Speech and behavior are normal. Patient exhibits appropriate insight and judgment   ___________________________________________   LABS (all labs ordered are listed, but only abnormal results are displayed)  Labs Reviewed - No data to display RADIOLOGY  Dg Ankle Complete Right  Result Date: 10/29/2016 CLINICAL DATA:  Fall this morning with lateral foot and ankle pain. EXAM: RIGHT ANKLE - COMPLETE 3+ VIEW COMPARISON:  Right foot 10/29/2016  FINDINGS: Lateral soft tissue swelling. Nondisplaced fracture at the base of the fifth metatarsal bone. The ankle is located. Prominent calcaneal spurs. IMPRESSION: Nondisplaced fracture at the base of the fifth metatarsal bone. Lateral soft tissue swelling in the right ankle.  No ankle fracture. Electronically Signed   By: Markus Daft M.D.   On: 10/29/2016 12:57   Dg Foot Complete Right  Result Date: 10/29/2016 CLINICAL DATA:  PT with lateral right foot and ankle pain after fall this morning. No hx of previous injury or trauma. EXAM: RIGHT FOOT COMPLETE - 3+ VIEW COMPARISON:  None. FINDINGS: Osteopenia limits characterization of osseous detail. There is a questionable nondisplaced fracture at the base of the fifth metatarsal bone, only seen on the lateral view. Remainder the osseous structures appear intact and normally aligned. IMPRESSION: Probable nondisplaced fracture at the base of the fifth metatarsal bone. Electronically Signed   By: Franki Cabot M.D.   On: 10/29/2016 12:55   ____________________________________________   PROCEDURES Procedures   Posterior OCL splint applied right foot by CMA.  INITIAL IMPRESSION / ASSESSMENT AND PLAN / ED COURSE  Pertinent labs & imaging results that were available during my care of the patient were reviewed by me and considered in my medical decision making (see chart for details).  Well-appearing patient. Patient sustained   Discussed fall prior to arrival from mechanical injury. Right ankle and right foot pain. Right ankle and right foot x-ray reviewed, nondisplaced fracture of the base of fifth metatarsal, lateral soft tissue swelling. Discussed and encouraged nonweightbearing to right foot. Posterior OCL splint applied. Encourage podiatry follow-up this week. Patient states that she has Tylenol as well as a few hydrocodone tablets at home if needed for breakthrough pain, denies need for pain medication prescription. Encouraged rest, ice and elevation.  No walkers at this facility at this time. Rx given for patient to obtained a walker. Counseled  to patient to not use walkers with wheels due to increased risk of fall.  Discussed follow up with Primary care physician this week as needed. Discussed follow up and return parameters including no resolution or any worsening concerns. Patient verbalized understanding and agreed to plan.   ____________________________________________   FINAL CLINICAL IMPRESSION(S) / ED DIAGNOSES  Final diagnoses:  Nondisplaced fracture  of fifth metatarsal bone, right foot, initial encounter for closed fracture  Sprain of right ankle, unspecified ligament, initial encounter  Fall, initial encounter     Discharge Medication List as of 10/29/2016  1:19 PM      Note: This dictation was prepared with Dragon dictation along with smaller phrase technology. Any transcriptional errors that result from this process are unintentional.         Marylene Land, NP 10/29/16 1442

## 2016-11-03 ENCOUNTER — Ambulatory Visit: Payer: PPO

## 2016-11-06 DIAGNOSIS — S8012XA Contusion of left lower leg, initial encounter: Secondary | ICD-10-CM | POA: Diagnosis not present

## 2016-11-06 DIAGNOSIS — S93401A Sprain of unspecified ligament of right ankle, initial encounter: Secondary | ICD-10-CM | POA: Diagnosis not present

## 2016-11-06 DIAGNOSIS — S92354A Nondisplaced fracture of fifth metatarsal bone, right foot, initial encounter for closed fracture: Secondary | ICD-10-CM | POA: Diagnosis not present

## 2016-11-06 DIAGNOSIS — S99921A Unspecified injury of right foot, initial encounter: Secondary | ICD-10-CM | POA: Diagnosis not present

## 2016-11-07 ENCOUNTER — Ambulatory Visit
Admission: RE | Admit: 2016-11-07 | Discharge: 2016-11-07 | Disposition: A | Discharge status: Home / Self Care | Payer: PPO | Source: Ambulatory Visit | Attending: Radiation Oncology | Admitting: Radiation Oncology

## 2016-11-07 DIAGNOSIS — F419 Anxiety disorder, unspecified: Secondary | ICD-10-CM | POA: Diagnosis not present

## 2016-11-07 DIAGNOSIS — Z9011 Acquired absence of right breast and nipple: Secondary | ICD-10-CM | POA: Diagnosis not present

## 2016-11-07 DIAGNOSIS — I10 Essential (primary) hypertension: Secondary | ICD-10-CM | POA: Diagnosis not present

## 2016-11-07 DIAGNOSIS — Z87891 Personal history of nicotine dependence: Secondary | ICD-10-CM | POA: Diagnosis not present

## 2016-11-07 DIAGNOSIS — K219 Gastro-esophageal reflux disease without esophagitis: Secondary | ICD-10-CM | POA: Diagnosis not present

## 2016-11-07 DIAGNOSIS — M1990 Unspecified osteoarthritis, unspecified site: Secondary | ICD-10-CM | POA: Diagnosis not present

## 2016-11-07 DIAGNOSIS — Z510 Encounter for antineoplastic radiation therapy: Secondary | ICD-10-CM | POA: Diagnosis not present

## 2016-11-07 DIAGNOSIS — F329 Major depressive disorder, single episode, unspecified: Secondary | ICD-10-CM | POA: Diagnosis not present

## 2016-11-07 DIAGNOSIS — Z170 Estrogen receptor positive status [ER+]: Secondary | ICD-10-CM | POA: Diagnosis not present

## 2016-11-07 DIAGNOSIS — C50411 Malignant neoplasm of upper-outer quadrant of right female breast: Secondary | ICD-10-CM | POA: Diagnosis not present

## 2016-11-07 DIAGNOSIS — Z86010 Personal history of colonic polyps: Secondary | ICD-10-CM | POA: Diagnosis not present

## 2016-11-07 DIAGNOSIS — Z79899 Other long term (current) drug therapy: Secondary | ICD-10-CM | POA: Diagnosis not present

## 2016-11-07 DIAGNOSIS — E7800 Pure hypercholesterolemia, unspecified: Secondary | ICD-10-CM | POA: Diagnosis not present

## 2016-11-08 DIAGNOSIS — C50411 Malignant neoplasm of upper-outer quadrant of right female breast: Secondary | ICD-10-CM | POA: Diagnosis not present

## 2016-11-08 DIAGNOSIS — Z510 Encounter for antineoplastic radiation therapy: Secondary | ICD-10-CM | POA: Diagnosis not present

## 2016-11-08 DIAGNOSIS — Z170 Estrogen receptor positive status [ER+]: Secondary | ICD-10-CM | POA: Diagnosis not present

## 2016-11-11 DIAGNOSIS — F331 Major depressive disorder, recurrent, moderate: Secondary | ICD-10-CM | POA: Diagnosis not present

## 2016-11-13 ENCOUNTER — Other Ambulatory Visit: Payer: Self-pay | Admitting: *Deleted

## 2016-11-13 DIAGNOSIS — Z170 Estrogen receptor positive status [ER+]: Secondary | ICD-10-CM | POA: Diagnosis not present

## 2016-11-13 DIAGNOSIS — C50911 Malignant neoplasm of unspecified site of right female breast: Secondary | ICD-10-CM | POA: Diagnosis not present

## 2016-11-13 DIAGNOSIS — C50411 Malignant neoplasm of upper-outer quadrant of right female breast: Secondary | ICD-10-CM

## 2016-11-14 ENCOUNTER — Ambulatory Visit
Admission: RE | Admit: 2016-11-14 | Discharge: 2016-11-14 | Disposition: A | Discharge status: Home / Self Care | Payer: PPO | Source: Ambulatory Visit | Attending: Radiation Oncology | Admitting: Radiation Oncology

## 2016-11-14 DIAGNOSIS — Z170 Estrogen receptor positive status [ER+]: Secondary | ICD-10-CM | POA: Diagnosis not present

## 2016-11-14 DIAGNOSIS — Z510 Encounter for antineoplastic radiation therapy: Secondary | ICD-10-CM | POA: Diagnosis not present

## 2016-11-14 DIAGNOSIS — C50411 Malignant neoplasm of upper-outer quadrant of right female breast: Secondary | ICD-10-CM | POA: Diagnosis not present

## 2016-11-15 ENCOUNTER — Ambulatory Visit
Admission: RE | Admit: 2016-11-15 | Discharge: 2016-11-15 | Disposition: A | Discharge status: Home / Self Care | Payer: PPO | Source: Ambulatory Visit | Attending: Radiation Oncology | Admitting: Radiation Oncology

## 2016-11-15 DIAGNOSIS — Z510 Encounter for antineoplastic radiation therapy: Secondary | ICD-10-CM | POA: Diagnosis not present

## 2016-11-16 ENCOUNTER — Ambulatory Visit
Admission: RE | Admit: 2016-11-16 | Discharge: 2016-11-16 | Disposition: A | Discharge status: Home / Self Care | Payer: PPO | Source: Ambulatory Visit | Attending: Radiation Oncology | Admitting: Radiation Oncology

## 2016-11-16 DIAGNOSIS — Z510 Encounter for antineoplastic radiation therapy: Secondary | ICD-10-CM | POA: Diagnosis not present

## 2016-11-17 ENCOUNTER — Ambulatory Visit
Admission: RE | Admit: 2016-11-17 | Discharge: 2016-11-17 | Disposition: A | Discharge status: Home / Self Care | Payer: PPO | Source: Ambulatory Visit | Attending: Radiation Oncology | Admitting: Radiation Oncology

## 2016-11-17 DIAGNOSIS — Z510 Encounter for antineoplastic radiation therapy: Secondary | ICD-10-CM | POA: Diagnosis not present

## 2016-11-20 ENCOUNTER — Ambulatory Visit
Admission: RE | Admit: 2016-11-20 | Discharge: 2016-11-20 | Disposition: A | Discharge status: Home / Self Care | Payer: PPO | Source: Ambulatory Visit | Attending: Radiation Oncology | Admitting: Radiation Oncology

## 2016-11-20 DIAGNOSIS — Z510 Encounter for antineoplastic radiation therapy: Secondary | ICD-10-CM | POA: Diagnosis not present

## 2016-11-21 ENCOUNTER — Ambulatory Visit: Payer: PPO

## 2016-11-22 ENCOUNTER — Ambulatory Visit
Admission: RE | Admit: 2016-11-22 | Discharge: 2016-11-22 | Disposition: A | Discharge status: Home / Self Care | Payer: PPO | Source: Ambulatory Visit | Attending: Radiation Oncology | Admitting: Radiation Oncology

## 2016-11-22 DIAGNOSIS — Z510 Encounter for antineoplastic radiation therapy: Secondary | ICD-10-CM | POA: Diagnosis not present

## 2016-11-22 DIAGNOSIS — Z170 Estrogen receptor positive status [ER+]: Secondary | ICD-10-CM | POA: Diagnosis not present

## 2016-11-22 DIAGNOSIS — C50411 Malignant neoplasm of upper-outer quadrant of right female breast: Secondary | ICD-10-CM | POA: Diagnosis not present

## 2016-11-23 ENCOUNTER — Ambulatory Visit
Admission: RE | Admit: 2016-11-23 | Discharge: 2016-11-23 | Disposition: A | Discharge status: Home / Self Care | Payer: PPO | Source: Ambulatory Visit | Attending: Radiation Oncology | Admitting: Radiation Oncology

## 2016-11-23 DIAGNOSIS — Z510 Encounter for antineoplastic radiation therapy: Secondary | ICD-10-CM | POA: Diagnosis not present

## 2016-11-24 ENCOUNTER — Ambulatory Visit
Admission: RE | Admit: 2016-11-24 | Discharge: 2016-11-24 | Disposition: A | Discharge status: Home / Self Care | Payer: PPO | Source: Ambulatory Visit | Attending: Radiation Oncology | Admitting: Radiation Oncology

## 2016-11-24 DIAGNOSIS — Z510 Encounter for antineoplastic radiation therapy: Secondary | ICD-10-CM | POA: Diagnosis not present

## 2016-11-28 ENCOUNTER — Ambulatory Visit
Admission: RE | Admit: 2016-11-28 | Discharge: 2016-11-28 | Disposition: A | Discharge status: Home / Self Care | Payer: PPO | Source: Ambulatory Visit | Attending: Radiation Oncology | Admitting: Radiation Oncology

## 2016-11-28 DIAGNOSIS — Z510 Encounter for antineoplastic radiation therapy: Secondary | ICD-10-CM | POA: Diagnosis not present

## 2016-11-29 ENCOUNTER — Inpatient Hospital Stay: Payer: PPO | Attending: Hematology and Oncology

## 2016-11-29 ENCOUNTER — Ambulatory Visit
Admission: RE | Admit: 2016-11-29 | Discharge: 2016-11-29 | Disposition: A | Discharge status: Home / Self Care | Payer: PPO | Source: Ambulatory Visit | Attending: Radiation Oncology | Admitting: Radiation Oncology

## 2016-11-29 DIAGNOSIS — Z170 Estrogen receptor positive status [ER+]: Secondary | ICD-10-CM | POA: Insufficient documentation

## 2016-11-29 DIAGNOSIS — Z9011 Acquired absence of right breast and nipple: Secondary | ICD-10-CM | POA: Insufficient documentation

## 2016-11-29 DIAGNOSIS — M8580 Other specified disorders of bone density and structure, unspecified site: Secondary | ICD-10-CM | POA: Insufficient documentation

## 2016-11-29 DIAGNOSIS — Z87891 Personal history of nicotine dependence: Secondary | ICD-10-CM | POA: Insufficient documentation

## 2016-11-29 DIAGNOSIS — Z510 Encounter for antineoplastic radiation therapy: Secondary | ICD-10-CM | POA: Diagnosis not present

## 2016-11-29 DIAGNOSIS — E7800 Pure hypercholesterolemia, unspecified: Secondary | ICD-10-CM | POA: Insufficient documentation

## 2016-11-29 DIAGNOSIS — F419 Anxiety disorder, unspecified: Secondary | ICD-10-CM | POA: Insufficient documentation

## 2016-11-29 DIAGNOSIS — I10 Essential (primary) hypertension: Secondary | ICD-10-CM | POA: Insufficient documentation

## 2016-11-29 DIAGNOSIS — K219 Gastro-esophageal reflux disease without esophagitis: Secondary | ICD-10-CM | POA: Insufficient documentation

## 2016-11-29 DIAGNOSIS — C50411 Malignant neoplasm of upper-outer quadrant of right female breast: Secondary | ICD-10-CM | POA: Insufficient documentation

## 2016-11-29 DIAGNOSIS — M1990 Unspecified osteoarthritis, unspecified site: Secondary | ICD-10-CM | POA: Insufficient documentation

## 2016-11-29 DIAGNOSIS — F329 Major depressive disorder, single episode, unspecified: Secondary | ICD-10-CM | POA: Insufficient documentation

## 2016-11-30 ENCOUNTER — Ambulatory Visit
Admission: RE | Admit: 2016-11-30 | Discharge: 2016-11-30 | Disposition: A | Discharge status: Home / Self Care | Payer: PPO | Source: Ambulatory Visit | Attending: Radiation Oncology | Admitting: Radiation Oncology

## 2016-11-30 DIAGNOSIS — Z510 Encounter for antineoplastic radiation therapy: Secondary | ICD-10-CM | POA: Diagnosis not present

## 2016-11-30 DIAGNOSIS — Z170 Estrogen receptor positive status [ER+]: Secondary | ICD-10-CM | POA: Diagnosis not present

## 2016-11-30 DIAGNOSIS — C50411 Malignant neoplasm of upper-outer quadrant of right female breast: Secondary | ICD-10-CM | POA: Diagnosis not present

## 2016-12-01 ENCOUNTER — Ambulatory Visit
Admission: RE | Admit: 2016-12-01 | Discharge: 2016-12-01 | Disposition: A | Discharge status: Home / Self Care | Payer: PPO | Source: Ambulatory Visit | Attending: Radiation Oncology | Admitting: Radiation Oncology

## 2016-12-01 DIAGNOSIS — Z510 Encounter for antineoplastic radiation therapy: Secondary | ICD-10-CM | POA: Diagnosis not present

## 2016-12-04 ENCOUNTER — Ambulatory Visit
Admission: RE | Admit: 2016-12-04 | Discharge: 2016-12-04 | Disposition: A | Discharge status: Home / Self Care | Payer: PPO | Source: Ambulatory Visit | Attending: Radiation Oncology | Admitting: Radiation Oncology

## 2016-12-04 DIAGNOSIS — Z510 Encounter for antineoplastic radiation therapy: Secondary | ICD-10-CM | POA: Diagnosis not present

## 2016-12-05 ENCOUNTER — Ambulatory Visit
Admission: RE | Admit: 2016-12-05 | Discharge: 2016-12-05 | Disposition: A | Discharge status: Home / Self Care | Payer: PPO | Source: Ambulatory Visit | Attending: Radiation Oncology | Admitting: Radiation Oncology

## 2016-12-05 DIAGNOSIS — Z510 Encounter for antineoplastic radiation therapy: Secondary | ICD-10-CM | POA: Diagnosis not present

## 2016-12-06 ENCOUNTER — Ambulatory Visit
Admission: RE | Admit: 2016-12-06 | Discharge: 2016-12-06 | Disposition: A | Discharge status: Home / Self Care | Payer: PPO | Source: Ambulatory Visit | Attending: Radiation Oncology | Admitting: Radiation Oncology

## 2016-12-06 DIAGNOSIS — Z510 Encounter for antineoplastic radiation therapy: Secondary | ICD-10-CM | POA: Diagnosis not present

## 2016-12-07 ENCOUNTER — Ambulatory Visit
Admission: RE | Admit: 2016-12-07 | Discharge: 2016-12-07 | Disposition: A | Discharge status: Home / Self Care | Payer: PPO | Source: Ambulatory Visit | Attending: Radiation Oncology | Admitting: Radiation Oncology

## 2016-12-07 DIAGNOSIS — C50411 Malignant neoplasm of upper-outer quadrant of right female breast: Secondary | ICD-10-CM | POA: Diagnosis not present

## 2016-12-07 DIAGNOSIS — Z510 Encounter for antineoplastic radiation therapy: Secondary | ICD-10-CM | POA: Diagnosis not present

## 2016-12-07 DIAGNOSIS — Z170 Estrogen receptor positive status [ER+]: Secondary | ICD-10-CM | POA: Diagnosis not present

## 2016-12-08 ENCOUNTER — Ambulatory Visit
Admission: RE | Admit: 2016-12-08 | Discharge: 2016-12-08 | Disposition: A | Discharge status: Home / Self Care | Payer: PPO | Source: Ambulatory Visit | Attending: Radiation Oncology | Admitting: Radiation Oncology

## 2016-12-08 DIAGNOSIS — Z510 Encounter for antineoplastic radiation therapy: Secondary | ICD-10-CM | POA: Diagnosis not present

## 2016-12-11 ENCOUNTER — Ambulatory Visit
Admission: RE | Admit: 2016-12-11 | Discharge: 2016-12-11 | Disposition: A | Discharge status: Home / Self Care | Payer: PPO | Source: Ambulatory Visit | Attending: Radiation Oncology | Admitting: Radiation Oncology

## 2016-12-11 DIAGNOSIS — Z510 Encounter for antineoplastic radiation therapy: Secondary | ICD-10-CM | POA: Diagnosis not present

## 2016-12-12 ENCOUNTER — Ambulatory Visit
Admission: RE | Admit: 2016-12-12 | Discharge: 2016-12-12 | Disposition: A | Discharge status: Home / Self Care | Payer: PPO | Source: Ambulatory Visit | Attending: Radiation Oncology | Admitting: Radiation Oncology

## 2016-12-12 DIAGNOSIS — Z510 Encounter for antineoplastic radiation therapy: Secondary | ICD-10-CM | POA: Diagnosis not present

## 2016-12-13 ENCOUNTER — Ambulatory Visit
Admission: RE | Admit: 2016-12-13 | Discharge: 2016-12-13 | Disposition: A | Discharge status: Home / Self Care | Payer: PPO | Source: Ambulatory Visit | Attending: Radiation Oncology | Admitting: Radiation Oncology

## 2016-12-13 ENCOUNTER — Inpatient Hospital Stay: Payer: PPO

## 2016-12-13 DIAGNOSIS — F329 Major depressive disorder, single episode, unspecified: Secondary | ICD-10-CM | POA: Diagnosis not present

## 2016-12-13 DIAGNOSIS — I10 Essential (primary) hypertension: Secondary | ICD-10-CM | POA: Diagnosis not present

## 2016-12-13 DIAGNOSIS — Z87891 Personal history of nicotine dependence: Secondary | ICD-10-CM | POA: Diagnosis not present

## 2016-12-13 DIAGNOSIS — Z170 Estrogen receptor positive status [ER+]: Secondary | ICD-10-CM | POA: Diagnosis not present

## 2016-12-13 DIAGNOSIS — K219 Gastro-esophageal reflux disease without esophagitis: Secondary | ICD-10-CM | POA: Diagnosis not present

## 2016-12-13 DIAGNOSIS — Z9011 Acquired absence of right breast and nipple: Secondary | ICD-10-CM | POA: Diagnosis not present

## 2016-12-13 DIAGNOSIS — E7800 Pure hypercholesterolemia, unspecified: Secondary | ICD-10-CM | POA: Diagnosis not present

## 2016-12-13 DIAGNOSIS — C50411 Malignant neoplasm of upper-outer quadrant of right female breast: Secondary | ICD-10-CM

## 2016-12-13 DIAGNOSIS — M8580 Other specified disorders of bone density and structure, unspecified site: Secondary | ICD-10-CM | POA: Diagnosis not present

## 2016-12-13 DIAGNOSIS — Z510 Encounter for antineoplastic radiation therapy: Secondary | ICD-10-CM | POA: Diagnosis not present

## 2016-12-13 DIAGNOSIS — M1990 Unspecified osteoarthritis, unspecified site: Secondary | ICD-10-CM | POA: Diagnosis not present

## 2016-12-13 DIAGNOSIS — F419 Anxiety disorder, unspecified: Secondary | ICD-10-CM | POA: Diagnosis not present

## 2016-12-13 LAB — CBC
HEMATOCRIT: 33 % — AB (ref 35.0–47.0)
HEMOGLOBIN: 11.2 g/dL — AB (ref 12.0–16.0)
MCH: 29.8 pg (ref 26.0–34.0)
MCHC: 33.9 g/dL (ref 32.0–36.0)
MCV: 87.7 fL (ref 80.0–100.0)
Platelets: 213 10*3/uL (ref 150–440)
RBC: 3.76 MIL/uL — ABNORMAL LOW (ref 3.80–5.20)
RDW: 15.6 % — ABNORMAL HIGH (ref 11.5–14.5)
WBC: 4.9 10*3/uL (ref 3.6–11.0)

## 2016-12-14 ENCOUNTER — Ambulatory Visit: Payer: PPO

## 2016-12-14 ENCOUNTER — Ambulatory Visit
Admission: RE | Admit: 2016-12-14 | Discharge: 2016-12-14 | Disposition: A | Discharge status: Home / Self Care | Payer: PPO | Source: Ambulatory Visit | Attending: Radiation Oncology | Admitting: Radiation Oncology

## 2016-12-14 DIAGNOSIS — Z170 Estrogen receptor positive status [ER+]: Secondary | ICD-10-CM | POA: Diagnosis not present

## 2016-12-14 DIAGNOSIS — C50411 Malignant neoplasm of upper-outer quadrant of right female breast: Secondary | ICD-10-CM | POA: Diagnosis not present

## 2016-12-14 DIAGNOSIS — Z510 Encounter for antineoplastic radiation therapy: Secondary | ICD-10-CM | POA: Diagnosis not present

## 2016-12-15 ENCOUNTER — Ambulatory Visit
Admission: RE | Admit: 2016-12-15 | Discharge: 2016-12-15 | Disposition: A | Discharge status: Home / Self Care | Payer: PPO | Source: Ambulatory Visit | Attending: Radiation Oncology | Admitting: Radiation Oncology

## 2016-12-15 DIAGNOSIS — Z510 Encounter for antineoplastic radiation therapy: Secondary | ICD-10-CM | POA: Diagnosis not present

## 2016-12-15 DIAGNOSIS — F331 Major depressive disorder, recurrent, moderate: Secondary | ICD-10-CM | POA: Diagnosis not present

## 2016-12-16 ENCOUNTER — Ambulatory Visit: Payer: PPO

## 2016-12-18 ENCOUNTER — Ambulatory Visit
Admission: RE | Admit: 2016-12-18 | Discharge: 2016-12-18 | Disposition: A | Discharge status: Home / Self Care | Payer: PPO | Source: Ambulatory Visit | Attending: Radiation Oncology | Admitting: Radiation Oncology

## 2016-12-19 ENCOUNTER — Ambulatory Visit
Admission: RE | Admit: 2016-12-19 | Discharge: 2016-12-19 | Disposition: A | Discharge status: Home / Self Care | Payer: PPO | Source: Ambulatory Visit | Attending: Radiation Oncology | Admitting: Radiation Oncology

## 2016-12-19 DIAGNOSIS — C50411 Malignant neoplasm of upper-outer quadrant of right female breast: Secondary | ICD-10-CM | POA: Diagnosis not present

## 2016-12-19 DIAGNOSIS — Z170 Estrogen receptor positive status [ER+]: Secondary | ICD-10-CM | POA: Diagnosis not present

## 2016-12-19 DIAGNOSIS — Z510 Encounter for antineoplastic radiation therapy: Secondary | ICD-10-CM | POA: Diagnosis not present

## 2016-12-20 ENCOUNTER — Ambulatory Visit
Admission: RE | Admit: 2016-12-20 | Discharge: 2016-12-20 | Disposition: A | Discharge status: Home / Self Care | Payer: PPO | Source: Ambulatory Visit | Attending: Radiation Oncology | Admitting: Radiation Oncology

## 2016-12-20 DIAGNOSIS — Z170 Estrogen receptor positive status [ER+]: Secondary | ICD-10-CM | POA: Diagnosis not present

## 2016-12-20 DIAGNOSIS — C50411 Malignant neoplasm of upper-outer quadrant of right female breast: Secondary | ICD-10-CM | POA: Diagnosis not present

## 2016-12-20 DIAGNOSIS — Z510 Encounter for antineoplastic radiation therapy: Secondary | ICD-10-CM | POA: Diagnosis not present

## 2016-12-21 ENCOUNTER — Ambulatory Visit
Admission: RE | Admit: 2016-12-21 | Discharge: 2016-12-21 | Disposition: A | Discharge status: Home / Self Care | Payer: PPO | Source: Ambulatory Visit | Attending: Radiation Oncology | Admitting: Radiation Oncology

## 2016-12-21 DIAGNOSIS — Z510 Encounter for antineoplastic radiation therapy: Secondary | ICD-10-CM | POA: Diagnosis not present

## 2016-12-22 ENCOUNTER — Ambulatory Visit
Admission: RE | Admit: 2016-12-22 | Discharge: 2016-12-22 | Disposition: A | Discharge status: Home / Self Care | Payer: PPO | Source: Ambulatory Visit | Attending: Radiation Oncology | Admitting: Radiation Oncology

## 2016-12-22 DIAGNOSIS — Z510 Encounter for antineoplastic radiation therapy: Secondary | ICD-10-CM | POA: Diagnosis not present

## 2016-12-22 DIAGNOSIS — Z170 Estrogen receptor positive status [ER+]: Secondary | ICD-10-CM | POA: Diagnosis not present

## 2016-12-22 DIAGNOSIS — C50411 Malignant neoplasm of upper-outer quadrant of right female breast: Secondary | ICD-10-CM | POA: Diagnosis not present

## 2016-12-23 ENCOUNTER — Ambulatory Visit: Payer: PPO

## 2016-12-25 ENCOUNTER — Ambulatory Visit
Admission: RE | Admit: 2016-12-25 | Discharge: 2016-12-25 | Disposition: A | Discharge status: Home / Self Care | Payer: PPO | Source: Ambulatory Visit | Attending: Radiation Oncology | Admitting: Radiation Oncology

## 2016-12-25 DIAGNOSIS — Z510 Encounter for antineoplastic radiation therapy: Secondary | ICD-10-CM | POA: Diagnosis not present

## 2016-12-26 ENCOUNTER — Ambulatory Visit: Payer: PPO

## 2016-12-26 ENCOUNTER — Ambulatory Visit
Admission: RE | Admit: 2016-12-26 | Discharge: 2016-12-26 | Disposition: A | Discharge status: Home / Self Care | Payer: PPO | Source: Ambulatory Visit | Attending: Radiation Oncology | Admitting: Radiation Oncology

## 2016-12-26 DIAGNOSIS — Z510 Encounter for antineoplastic radiation therapy: Secondary | ICD-10-CM | POA: Diagnosis not present

## 2016-12-27 ENCOUNTER — Inpatient Hospital Stay: Payer: PPO | Attending: Radiation Oncology

## 2016-12-27 ENCOUNTER — Ambulatory Visit
Admission: RE | Admit: 2016-12-27 | Discharge: 2016-12-27 | Disposition: A | Discharge status: Home / Self Care | Payer: PPO | Source: Ambulatory Visit | Attending: Radiation Oncology | Admitting: Radiation Oncology

## 2016-12-27 DIAGNOSIS — Z87891 Personal history of nicotine dependence: Secondary | ICD-10-CM | POA: Insufficient documentation

## 2016-12-27 DIAGNOSIS — K219 Gastro-esophageal reflux disease without esophagitis: Secondary | ICD-10-CM | POA: Diagnosis not present

## 2016-12-27 DIAGNOSIS — C50411 Malignant neoplasm of upper-outer quadrant of right female breast: Secondary | ICD-10-CM | POA: Insufficient documentation

## 2016-12-27 DIAGNOSIS — Z79899 Other long term (current) drug therapy: Secondary | ICD-10-CM | POA: Insufficient documentation

## 2016-12-27 DIAGNOSIS — I10 Essential (primary) hypertension: Secondary | ICD-10-CM | POA: Insufficient documentation

## 2016-12-27 DIAGNOSIS — M1990 Unspecified osteoarthritis, unspecified site: Secondary | ICD-10-CM | POA: Diagnosis not present

## 2016-12-27 DIAGNOSIS — F329 Major depressive disorder, single episode, unspecified: Secondary | ICD-10-CM | POA: Diagnosis not present

## 2016-12-27 DIAGNOSIS — Z923 Personal history of irradiation: Secondary | ICD-10-CM | POA: Insufficient documentation

## 2016-12-27 DIAGNOSIS — Z510 Encounter for antineoplastic radiation therapy: Secondary | ICD-10-CM | POA: Diagnosis not present

## 2016-12-27 DIAGNOSIS — Z170 Estrogen receptor positive status [ER+]: Secondary | ICD-10-CM | POA: Diagnosis not present

## 2016-12-27 DIAGNOSIS — F419 Anxiety disorder, unspecified: Secondary | ICD-10-CM | POA: Insufficient documentation

## 2016-12-27 DIAGNOSIS — M8580 Other specified disorders of bone density and structure, unspecified site: Secondary | ICD-10-CM | POA: Insufficient documentation

## 2016-12-27 DIAGNOSIS — E7800 Pure hypercholesterolemia, unspecified: Secondary | ICD-10-CM | POA: Diagnosis not present

## 2016-12-27 LAB — CBC
HCT: 33.1 % — ABNORMAL LOW (ref 35.0–47.0)
Hemoglobin: 11.1 g/dL — ABNORMAL LOW (ref 12.0–16.0)
MCH: 29.6 pg (ref 26.0–34.0)
MCHC: 33.6 g/dL (ref 32.0–36.0)
MCV: 88 fL (ref 80.0–100.0)
PLATELETS: 211 10*3/uL (ref 150–440)
RBC: 3.76 MIL/uL — ABNORMAL LOW (ref 3.80–5.20)
RDW: 15.7 % — AB (ref 11.5–14.5)
WBC: 5 10*3/uL (ref 3.6–11.0)

## 2016-12-28 ENCOUNTER — Ambulatory Visit
Admission: RE | Admit: 2016-12-28 | Discharge: 2016-12-28 | Disposition: A | Discharge status: Home / Self Care | Payer: PPO | Source: Ambulatory Visit | Attending: Radiation Oncology | Admitting: Radiation Oncology

## 2016-12-28 DIAGNOSIS — Z510 Encounter for antineoplastic radiation therapy: Secondary | ICD-10-CM | POA: Diagnosis not present

## 2016-12-29 ENCOUNTER — Ambulatory Visit
Admission: RE | Admit: 2016-12-29 | Discharge: 2016-12-29 | Disposition: A | Discharge status: Home / Self Care | Payer: PPO | Source: Ambulatory Visit | Attending: Radiation Oncology | Admitting: Radiation Oncology

## 2016-12-29 DIAGNOSIS — Z170 Estrogen receptor positive status [ER+]: Secondary | ICD-10-CM | POA: Diagnosis not present

## 2016-12-29 DIAGNOSIS — Z510 Encounter for antineoplastic radiation therapy: Secondary | ICD-10-CM | POA: Diagnosis not present

## 2016-12-29 DIAGNOSIS — C50411 Malignant neoplasm of upper-outer quadrant of right female breast: Secondary | ICD-10-CM | POA: Diagnosis not present

## 2017-01-01 ENCOUNTER — Ambulatory Visit
Admission: RE | Admit: 2017-01-01 | Discharge: 2017-01-01 | Disposition: A | Discharge status: Home / Self Care | Payer: PPO | Source: Ambulatory Visit | Attending: Radiation Oncology | Admitting: Radiation Oncology

## 2017-01-01 DIAGNOSIS — Z510 Encounter for antineoplastic radiation therapy: Secondary | ICD-10-CM | POA: Diagnosis not present

## 2017-01-02 ENCOUNTER — Ambulatory Visit
Admission: RE | Admit: 2017-01-02 | Discharge: 2017-01-02 | Disposition: A | Discharge status: Home / Self Care | Payer: PPO | Source: Ambulatory Visit | Attending: Radiation Oncology | Admitting: Radiation Oncology

## 2017-01-02 DIAGNOSIS — Z510 Encounter for antineoplastic radiation therapy: Secondary | ICD-10-CM | POA: Diagnosis not present

## 2017-01-03 ENCOUNTER — Ambulatory Visit
Admission: RE | Admit: 2017-01-03 | Discharge: 2017-01-03 | Disposition: A | Discharge status: Home / Self Care | Payer: PPO | Source: Ambulatory Visit | Attending: Radiation Oncology | Admitting: Radiation Oncology

## 2017-01-03 DIAGNOSIS — Z510 Encounter for antineoplastic radiation therapy: Secondary | ICD-10-CM | POA: Diagnosis not present

## 2017-01-04 ENCOUNTER — Ambulatory Visit: Payer: PPO

## 2017-01-05 ENCOUNTER — Ambulatory Visit: Payer: PPO

## 2017-01-05 ENCOUNTER — Ambulatory Visit
Admission: RE | Admit: 2017-01-05 | Discharge: 2017-01-05 | Disposition: A | Discharge status: Home / Self Care | Payer: PPO | Source: Ambulatory Visit | Attending: Radiation Oncology | Admitting: Radiation Oncology

## 2017-01-05 DIAGNOSIS — Z510 Encounter for antineoplastic radiation therapy: Secondary | ICD-10-CM | POA: Diagnosis not present

## 2017-01-08 ENCOUNTER — Ambulatory Visit
Admission: RE | Admit: 2017-01-08 | Discharge: 2017-01-08 | Disposition: A | Discharge status: Home / Self Care | Payer: PPO | Source: Ambulatory Visit | Attending: Radiation Oncology | Admitting: Radiation Oncology

## 2017-01-08 DIAGNOSIS — Z510 Encounter for antineoplastic radiation therapy: Secondary | ICD-10-CM | POA: Diagnosis not present

## 2017-01-08 DIAGNOSIS — Z170 Estrogen receptor positive status [ER+]: Secondary | ICD-10-CM | POA: Diagnosis not present

## 2017-01-08 DIAGNOSIS — C50411 Malignant neoplasm of upper-outer quadrant of right female breast: Secondary | ICD-10-CM | POA: Diagnosis not present

## 2017-01-12 DIAGNOSIS — F331 Major depressive disorder, recurrent, moderate: Secondary | ICD-10-CM | POA: Diagnosis not present

## 2017-01-16 NOTE — Progress Notes (Signed)
[facility] day:  01/17/2017  Chief Complaint: Allison Hall is a 76 y.o. female with right breast cancer who is seen for 3 month assessment to define course of treatment.   HPI:  The patient was last seen in the medical oncology clinic on 10/18/2016.  At that time, patient had recently undergone partial mastectomy with sentinel node biopsy. Pathology revealed multi-focal breast cancer (1.5 cm and 1.6 cm grade I invasive carcinoma, tubulo-lobular variant).  Three sentinel lymph nodes were negative.  Tumor was ER + (> 90%), PR + (> 90%) and Her2/neu 2+ (negative by FISH).  Pathologic stage was T1c(m) N0(sn).  MammaPrint revealed a high risk of recurrence (29%).  She met with radiation oncology with plans to deliver 5040 cGy in 28 fractions boosting her scar to another 1400 cGy using electron beam.  Patient was to pursue a second opinion at [facility] with Dr. Micah Noel.  Patient was seen at [facility] in consult by Dr. Juanita Craver on 10/25/2016. Patient states that Dr. Juanita Craver told her that she did not need chemotherapy.  It was recommended that she receive 5 years of hormonal therapy (AI) + possibly a bone agent.  She was seen by [facility] on 10/29/2016 following a fall. She complained of right ankle and foot pain. Plain films revealed a nondisplaced fracture of the base of the fifth metatarsal with lateral soft tissue swelling. Patient was placed in a posterior OCL splint and encouraged to follow up with podiatry. Patient was prescribed a walker to assist with ambulation. Patient was seen and treated by Dr. Jefm Bryant on 11/06/2016.  She received radiation from 11/15/2016 - 01/08/2017.  Symptomatically, patient is doing well. She denies any complaints today. Patient denies breast concerns. She has had no B symptoms or interval infections. Patient eating well, with no demonstrated weight loss.    Past Medical History:  Diagnosis Date  . Anxiety   .  Cancer ([place])    skin ca  . Colon polyp   . Depression   . GERD (gastroesophageal reflux disease)   . Hypercholesteremia   . Hypertension   . Osteoarthritis     Past Surgical History:  Procedure Laterality Date  . ABDOMINAL HYSTERECTOMY    . APPENDECTOMY    . JOINT REPLACEMENT    . NOSE SURGERY    . PARTIAL MASTECTOMY WITH NEEDLE LOCALIZATION Right 09/29/2016   Procedure: PARTIAL MASTECTOMY WITH NEEDLE LOCALIZATION;  Surgeon: [name], MD;  Location: [facility] ORS;  Service: General;  Laterality: Right;  . REPLACEMENT TOTAL KNEE Right 2008  . SENTINEL NODE BIOPSY Right 09/29/2016   Procedure: SENTINEL NODE BIOPSY;  Surgeon: [name], MD;  Location: [facility] ORS;  Service: General;  Laterality: Right;    Family History  Problem Relation Age of Onset  . Hypertension Mother   . Depression Mother   . Heart failure Father   . Obesity Brother   . Heart disease Brother   . Stroke Brother   . Depression Brother   . Breast cancer Cousin        2 mat and 1 pat cousins    Social History:  reports that she quit smoking about 51 years ago. She has a 10.00 pack-year smoking history. She has never used smokeless tobacco. She reports that she drinks about 2.4 oz of alcohol per week . She reports that she does not use drugs.  She previously smoked 1/2 pack/day x 10 years.  She stopped  smoking on 10/11/1978.  She drinks 6 glasses of wine/week.  She has 3 children (2 sons and 1 daughter).  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  She likes to do crossword puzzles.  Her daughter's name is Allison Hall.  She lives in [place].  She is alone today.  Allergies:  Allergies  Allergen Reactions  . Sulfamethoxazole Other (See Comments)    Anxiety and imsomnia  . Statins Other (See Comments)    Muscle pain   . Trazodone Other (See Comments)    Pt states she felt "unsteady"   . Ciprofloxacin Anxiety  . Sulfa Antibiotics Anxiety    Current Medications: Current Outpatient Prescriptions   Medication Sig Dispense Refill  . acetaminophen (TYLENOL) 500 MG tablet Take 500 mg by mouth 2 (two) times daily.    Marland Kitchen amLODipine (NORVASC) 2.5 MG tablet Take by mouth.    Marland Kitchen buPROPion (WELLBUTRIN SR) 100 MG 12 hr tablet Take 1 tablet (100 mg total) by mouth daily. (Patient taking differently: Take 200 mg by mouth daily. ) 30 tablet 1  . busPIRone (BUSPAR) 10 MG tablet Take 10 mg by mouth 3 (three) times daily.    . Cholecalciferol (VITAMIN D) 2000 units CAPS Take 4,000 Units by mouth daily.    Marland Kitchen lisinopril (PRINIVIL,ZESTRIL) 20 MG tablet Take 30 mg by mouth daily. Takes 1.5 tablet    . Magnesium 250 MG TABS Take 2 tablets by mouth daily.    Marland Kitchen omeprazole (PRILOSEC) 40 MG capsule Take 20 mg by mouth.     . rosuvastatin (CRESTOR) 5 MG tablet Take 5 mg by mouth daily.    . sertraline (ZOLOFT) 100 MG tablet Take 1.5 tablets (150 mg total) by mouth daily. (Patient taking differently: Take 100-150 mg by mouth See admin instructions. Takes 100 mg daily except on Monday, Wednesday and Fridays and takes 150 mg (1.5 tablet)) 45 tablet 1  . Turmeric Curcumin 500 MG CAPS Take 1,500 mg by mouth daily.     Marland Kitchen amLODipine (NORVASC) 5 MG tablet Take 5 mg by mouth daily.     Marland Kitchen loperamide (IMODIUM) 2 MG capsule Take 2 mg by mouth as needed for diarrhea or loose stools.     Marland Kitchen UNABLE TO FIND Take 1,000 mg by mouth 3 (three) times daily. Hemp Oil capsules 1000 mg each Also contains Omega 3 and Omega 6 Fatty Acids     No current facility-administered medications for this visit.     Review of Systems:  GENERAL:  Feels fine.  Mild fatigue.  No fevers or sweats.  Weight up 1 pound.  PERFORMANCE STATUS (ECOG):  0 HEENT:  No visual changes, runny nose, sore throat, mouth sores or tenderness. Lungs: No shortness of breath or cough.  No hemoptysis. Cardiac:  No chest pain, palpitations, orthopnea, or PND. GI:  No nausea, vomiting, diarrhea, constipation, melena or hematochezia. GU:  No urgency, frequency, dysuria, or  hematuria. Musculoskeletal:  No back pain.  "Left knee bone on bone".  No muscle tenderness. Extremities:  No pain or swelling. Skin: No rashes or skin changes. Neuro:  Poor balance.  No headache, numbness or weakness, or coordination issues. Endocrine:  No diabetes or thyroid issues.  Some sweats and hot flashes with the weather. Psych:  No mood changes, depression or anxiety. Pain:  No focal pain. Review of systems:  All other systems reviewed and found to be negative.  Physical Exam: Blood pressure 135/79, pulse 96, temperature 98.3 F (36.8 C), temperature source Tympanic, resp.  rate 20, weight 205 lb 11 oz (93.3 kg). GENERAL:  Well developed, well nourished, woman sitting comfortably in the exam room in no acute distress. MENTAL STATUS:  Alert and oriented to person, place and time. HEAD:  Short brown styled hair.  Normocephalic, atraumatic, face symmetric, no Cushingoid features. EYES:  Glasses.  Green eyes.  Pupils equal round and reactive to light and accomodation.  No conjunctivitis or scleral icterus. ENT:  Oropharynx clear without lesion.  Tongue normal. Mucous membranes moist.  RESPIRATORY:  Clear to auscultation without rales, wheezes or rhonchi. CARDIOVASCULAR:  Regular rate and rhythm without murmur, rub or gallop. BREAST:  Right breast tender at 11 o'clock with post operative changes.  Skin with slight increased pigmentation and desquamation post radaition.  Left breast with fibrocystic changes in the upper outer quadrant.  No masses, skin changes or nipple discharge.  ABDOMEN:  Soft, non-tender, with active bowel sounds, and no hepatosplenomegaly.  No masses. SKIN:  No rashes, ulcers or lesions. EXTREMITIES: No edema, no skin discoloration or tenderness.  No palpable cords. LYMPH NODES: No palpable cervical, supraclavicular, axillary or inguinal adenopathy  NEUROLOGICAL: Unremarkable. PSYCH:  Appropriate.   No visits with results within 3 Day(s) from this visit.  Latest  known visit with results is:  Hospital Outpatient Visit on 08/24/2016  Component Date Value Ref Range Status  . SURGICAL PATHOLOGY 08/24/2016    Final-Edited                   Value:Surgical Pathology THIS IS AN ADDENDUM REPORT CASE: [id number] PATIENT: [facility] Surgical Pathology Report Addendum  Reason for Addendum #1:  Immunohistochemistry results  SPECIMEN SUBMITTED: A. Breast, right, 9:30 B. Breast, right, 10:00  CLINICAL HISTORY: Masses  PRE-OPERATIVE DIAGNOSIS: IMC, possibly lobular  POST-OPERATIVE DIAGNOSIS: None provided.     DIAGNOSIS: A. BREAST, RIGHT 9:30; ULTRASOUND GUIDED BIOPSY: - INVASIVE MAMMARY CARCINOMA, DIFFUSELY INFILTRATIVE WITH LOBULAR-LIKE FEATURES.  Size of invasive carcinoma: 6 mm in this sample Histologic grade of invasive carcinoma: 2      Glandular/tubular differentiation score: 3      Nuclear pleomorphism score: 2      Mitotic rate score: 1      Total score: 6  Ductal carcinoma in situ: Not identified Lymphovascular invasion: Not identified  B. Breast, right 10:00; ULTRASOUND GUIDED BIOPSY: - INVASIVE MAMMARY CARCINOMA, WITH LOBULAR FEATURES.  Size                          of invasive carcinoma: 8 mm in this sample Histologic grade of invasive carcinoma: 2      Glandular/tubular differentiation score: 3      Nuclear pleomorphism score: 2      Mitotic rate score: 1      Total score: 6  Ductal carcinoma in situ: Not identified Lymphovascular invasion: Not identified  Comment: The definitive grade will be assigned on the excisional specimen. The carcinoma is the two biopsy specimens have identical morphology. ER/PR/HER2: Immunohistochemistry will be performed on block A1, with reflex to [place] for HER2 2+. The results will be reported in an addendum. These findings were communicated to [place] in Dr. Janace Hoard office on 08/25/2016. Read back procedure was performed.     GROSS DESCRIPTION:  A. The  specimen is received in a formalin-filled container labeled with the patient's name and right breast 9:30, 7 cm from nipple.  Core pieces: multiple, aggregate 1.6 x 0.4 x 0.1 cm Comments: red yellow lobulated  fibrofatty tissue, ma                         rked green  Entirely submitted in cassette(s): 1  Time/Date in fixative: placed in formalin at 1:40 PM on 08/24/2016 cold ischemic time of less than 2 minutes Total fixation time: 6.7 Hours  B. The specimen is received in a formalin-filled container labeled with the patient's name and right breast #2 10:00, 5 cm from nipple.  Core pieces: multiple, aggregate 1.7 x 0.5 x 0.1 cm Comments: yellow to pink lobulated fibrofatty, marked black  Entirely submitted in cassette(s): 1  Time/Date in fixative: placed in formalin at 1:50 PM on 08/24/2016 cold ischemic time of less than 2 minutes Total fixation time: 6.6 hours    Final Diagnosis performed by Quay Burow, MD.  Electronically signed 08/25/2016 2:54:14PM   The electronic signature indicates that the named Attending Pathologist has evaluated the specimen  Technical component performed at [facility], [address], [place], [place] [address] Lab: [phone] Dir: Darrick Penna. Evette Doffing, MD  Professional component perf                         ormed at [facility], [facility], [place], [place], [place] [address] Lab: [phone] Dir: Dellia Nims. Rubinas, MD   Breast Biomarker Reporting Template  BREAST BIOMARKER TESTS Estrogen Receptor (ER) Status: Positive, greater than 90%      Average intensity of staining: Strong Progesterone Receptor (PgR) Status: Positive, greater than 90%      Average intensity of staining: Strong HER2 (by immunohistochemistry): 2+ equivocal Percentage of cells with uniform intense complete membrane staining: 5% HER2 FISH pending.  METHODS Cold Ischemia and Fixation Times: Meet requirements specified in latest version of the  ASCO/CAP guidelines Testing Performed on Block Number(s): A1 Fixative: Formalin Estrogen Receptor:  FDA cleared ([place])                    Primary Antibody:  SP1 Progesterone Receptor: FDA cleared ([place])                   Primary Antibody: 1E2 HER2 (by immunohistochemistry): FDA approved ([place])                             Prima                         ry Antibody: HercepTest  Immunohistochemistry controls worked appropriately.  Immunohistochemistry controls worked appropriately. Slides were prepared by [facility], [place], [place], and interpreted by Quay Burow, MD.     Addendum #1 performed by Quay Burow, MD.  Electronically signed 08/30/2016 4:33:51PM    Technical component performed at [facility], [address] [place], [place] [address] Lab: [phone] Dir: Darrick Penna. Evette Doffing, MD  Professional component performed at [facility], [facility], [place], [place],  [address] Lab: [phone] Dir: Dellia Nims. Rubinas, MD      Assessment:  Allison Hall is a 76 y.o. female with multi-focal right breast cancer s/p partial mastectomy with sentinel lymph node biopsy on 09/29/2016.  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  Three sentinel lymph nodes were negative.  Tumor was ER + (> 90%), PR + (> 90%) and  Her2/neu 2+ (negative by FISH).  Pathologic stage was T1c(m) N0(sn).  CA27.29 was 13.4 on 09/06/2016.  MammaPrint revealed a 29% risk of recurrence (high).  She was seen for second opinion at [facility] on 10/25/2016.  Chemotherapy was not recommended.  Bilateral screening mammogram on 07/24/2016 revealed a possible area of distortion in the right breast. Diagnostic mammogram on 08/10/2016 revealed 2 suspicious masses in the upper-outer quadrant of the right breast in a broad area of confluent distortion in the  upper-outer quadrant of the right breast.  There are at least 3 areas of confluent distortion spanning approximately 6.5 cm of tissue.  Ultrasound on 07/31/2016 revealed  an irregular hypoechoic 1.2 x 1.1 x 1.2 cm at the 9:30 position 7 cm from the nipple.  In addition, there was an irregular 1.9 x 0.8 x 1.2 cm mass at the 10 o'clock position 5 cm from the nipple.  There were multiple small normal-appearing lymph nodes.   Breast MRI on 09/16/2016 revealed an irregular 1.6 x 1.2 x 1.6 cm enhancing mass adjacent to the clip artifact within the upper, outer right breast, middle depth, corresponding to the mass seen at 10 o'clock. There was an additional irregular enhancing mass adjacent to the more posterior clip artifact measuring 2.6 x 1.4 x 1.6 cm, corresponding  to the mass seen at 9:30 position. Together, these 2 masses span 6.3 cm of breast tissue from anterior to posterior. There was no definite additional suspicious area of enhancement within the right breast to correspond to the third area of distortion identified on diagnostic mammogram dated 08/10/2016.  Left breast revealed no suspicious mass or enhancement.  She received radiation from 11/15/2016 - 01/08/2017.  Bone density study on 09/30/2015 revealed osteopenia with a T-score of -2.1 in the right femoral neck.  Symptomatically, she is doing well.  Exam reveals post-operative and post radiation changes.  Plan: 1. Labs today: CBC with diff, CMP    2. Discuss second opinion at [facility].  Patient has completed radiation therapy. Discuss hormonal therapy (aromatase inhibitor) versus Tamoxifen . Treatment is individualized for patients over 70.  She is in good health. Side effects reviewed. Patient wishing to proceed with Femara.  Written information provided on the check out AVS today for patient review. 3.  Discussed BCI testing to assess benefit of extended adjuvant hormonal therapy that will be done at the 4.5 year mark. 4.  Discuss bone thinning  associated with use of aromatase inhibitor. Discussed Prolia injections every 6 months. Side effects reviewed. Patient will require dental clearance prior to the initiation of therapy. Written information provided on the check out AVS today for patient review.  5.  Encouraged calcium '1200mg'$  and Vitamin 800 IU daily.  6.  Rx: Femara 2.'5mg'$  PO daily (Disp #30) 7.  Preauthorize Prolia  8.  RTC in 1 month for MD assessment, labs (CMP), and Prolia.   Honor Loh, NP  01/17/2017, 11:44 AM   I saw and evaluated the patient, participating in the key portions of the service and reviewing pertinent diagnostic studies and records.  I reviewed the nurse practitioner's note and agree with the findings and the plan.  The assessment and plan were discussed with the patient.  Multiple questions were asked by the patient and answered.   Nolon Stalls, MD 01/17/2017, 11:44 AM

## 2017-01-17 ENCOUNTER — Telehealth: Payer: Self-pay | Admitting: *Deleted

## 2017-01-17 ENCOUNTER — Inpatient Hospital Stay: Payer: PPO

## 2017-01-17 ENCOUNTER — Inpatient Hospital Stay (HOSPITAL_BASED_OUTPATIENT_CLINIC_OR_DEPARTMENT_OTHER): Payer: PPO | Admitting: Hematology and Oncology

## 2017-01-17 ENCOUNTER — Other Ambulatory Visit: Payer: Self-pay | Admitting: *Deleted

## 2017-01-17 VITALS — BP 135/79 | HR 96 | Temp 98.3°F | Resp 20 | Wt 205.7 lb

## 2017-01-17 DIAGNOSIS — C50411 Malignant neoplasm of upper-outer quadrant of right female breast: Secondary | ICD-10-CM

## 2017-01-17 DIAGNOSIS — Z79899 Other long term (current) drug therapy: Secondary | ICD-10-CM | POA: Diagnosis not present

## 2017-01-17 DIAGNOSIS — Z170 Estrogen receptor positive status [ER+]: Secondary | ICD-10-CM | POA: Diagnosis not present

## 2017-01-17 DIAGNOSIS — M8580 Other specified disorders of bone density and structure, unspecified site: Secondary | ICD-10-CM

## 2017-01-17 DIAGNOSIS — Z853 Personal history of malignant neoplasm of breast: Secondary | ICD-10-CM

## 2017-01-17 DIAGNOSIS — Z923 Personal history of irradiation: Secondary | ICD-10-CM

## 2017-01-17 LAB — COMPREHENSIVE METABOLIC PANEL
ALT: 15 U/L (ref 14–54)
ANION GAP: 9 (ref 5–15)
AST: 21 U/L (ref 15–41)
Albumin: 4.7 g/dL (ref 3.5–5.0)
Alkaline Phosphatase: 78 U/L (ref 38–126)
BUN: 23 mg/dL — ABNORMAL HIGH (ref 6–20)
CHLORIDE: 102 mmol/L (ref 101–111)
CO2: 25 mmol/L (ref 22–32)
Calcium: 9.3 mg/dL (ref 8.9–10.3)
Creatinine, Ser: 1.08 mg/dL — ABNORMAL HIGH (ref 0.44–1.00)
GFR, EST AFRICAN AMERICAN: 56 mL/min — AB (ref >60)
GFR, EST NON AFRICAN AMERICAN: 48 mL/min — AB (ref >60)
Glucose, Bld: 96 mg/dL (ref 65–99)
POTASSIUM: 4 mmol/L (ref 3.5–5.1)
SODIUM: 136 mmol/L (ref 135–145)
Total Bilirubin: 0.5 mg/dL (ref 0.3–1.2)
Total Protein: 7.4 g/dL (ref 6.5–8.1)

## 2017-01-17 LAB — CBC WITH DIFFERENTIAL/PLATELET
Basophils Absolute: 0 10*3/uL (ref 0–0.1)
Basophils Relative: 1 %
EOS ABS: 0.1 10*3/uL (ref 0–0.7)
EOS PCT: 2 %
HCT: 34.7 % — ABNORMAL LOW (ref 35.0–47.0)
Hemoglobin: 11.8 g/dL — ABNORMAL LOW (ref 12.0–16.0)
LYMPHS ABS: 0.8 10*3/uL — AB (ref 1.0–3.6)
Lymphocytes Relative: 21 %
MCH: 29.7 pg (ref 26.0–34.0)
MCHC: 33.9 g/dL (ref 32.0–36.0)
MCV: 87.5 fL (ref 80.0–100.0)
MONO ABS: 0.5 10*3/uL (ref 0.2–0.9)
Monocytes Relative: 13 %
Neutro Abs: 2.4 10*3/uL (ref 1.4–6.5)
Neutrophils Relative %: 63 %
PLATELETS: 217 10*3/uL (ref 150–440)
RBC: 3.97 MIL/uL (ref 3.80–5.20)
RDW: 14.9 % — AB (ref 11.5–14.5)
WBC: 3.7 10*3/uL (ref 3.6–11.0)

## 2017-01-17 MED ORDER — LETROZOLE 2.5 MG PO TABS: 2.5000 mg | ORAL_TABLET | Freq: Every day | ORAL | 0 refills | Status: DC

## 2017-01-17 NOTE — Telephone Encounter (Signed)
Patient does not need authorization for Prolia.

## 2017-01-17 NOTE — Patient Instructions (Addendum)
Denosumab injection What is this medicine? DENOSUMAB (den oh sue mab) slows bone breakdown. Prolia is used to treat osteoporosis in women after menopause and in men. Delton See is used to treat a high calcium level due to cancer and to prevent bone fractures and other bone problems caused by multiple myeloma or cancer bone metastases. Delton See is also used to treat giant cell tumor of the bone. This medicine may be used for other purposes; ask your health care provider or pharmacist if you have questions. COMMON BRAND NAME(S): Prolia, XGEVA What should I tell my health care provider before I take this medicine? They need to know if you have any of these conditions: -dental disease -having surgery or tooth extraction -infection -kidney disease -low levels of calcium or Vitamin D in the blood -malnutrition -on hemodialysis -skin conditions or sensitivity -thyroid or parathyroid disease -an unusual reaction to denosumab, other medicines, foods, dyes, or preservatives -pregnant or trying to get pregnant -breast-feeding How should I use this medicine? This medicine is for injection under the skin. It is given by a health care professional in a hospital or clinic setting. If you are getting Prolia, a special MedGuide will be given to you by the pharmacist with each prescription and refill. Be sure to read this information carefully each time. For Prolia, talk to your pediatrician regarding the use of this medicine in children. Special care may be needed. For Delton See, talk to your pediatrician regarding the use of this medicine in children. While this drug may be prescribed for children as young as 13 years for selected conditions, precautions do apply. Overdosage: If you think you have taken too much of this medicine contact a poison control center or emergency room at once. NOTE: This medicine is only for you. Do not share this medicine with others. What if I miss a dose? It is important not to miss your  dose. Call your doctor or health care professional if you are unable to keep an appointment. What may interact with this medicine? Do not take this medicine with any of the following medications: -other medicines containing denosumab This medicine may also interact with the following medications: -medicines that lower your chance of fighting infection -steroid medicines like prednisone or cortisone This list may not describe all possible interactions. Give your health care provider a list of all the medicines, herbs, non-prescription drugs, or dietary supplements you use. Also tell them if you smoke, drink alcohol, or use illegal drugs. Some items may interact with your medicine. What should I watch for while using this medicine? Visit your doctor or health care professional for regular checks on your progress. Your doctor or health care professional may order blood tests and other tests to see how you are doing. Call your doctor or health care professional for advice if you get a fever, chills or sore throat, or other symptoms of a cold or flu. Do not treat yourself. This drug may decrease your body's ability to fight infection. Try to avoid being around people who are sick. You should make sure you get enough calcium and vitamin D while you are taking this medicine, unless your doctor tells you not to. Discuss the foods you eat and the vitamins you take with your health care professional. See your dentist regularly. Brush and floss your teeth as directed. Before you have any dental work done, tell your dentist you are receiving this medicine. Do not become pregnant while taking this medicine or for 5 months after stopping  it. Talk with your doctor or health care professional about your birth control options while taking this medicine. Women should inform their doctor if they wish to become pregnant or think they might be pregnant. There is a potential for serious side effects to an unborn child. Talk  to your health care professional or pharmacist for more information. What side effects may I notice from receiving this medicine? Side effects that you should report to your doctor or health care professional as soon as possible: -allergic reactions like skin rash, itching or hives, swelling of the face, lips, or tongue -bone pain -breathing problems -dizziness -jaw pain, especially after dental work -redness, blistering, peeling of the skin -signs and symptoms of infection like fever or chills; cough; sore throat; pain or trouble passing urine -signs of low calcium like fast heartbeat, muscle cramps or muscle pain; pain, tingling, numbness in the hands or feet; seizures -unusual bleeding or bruising -unusually weak or tired Side effects that usually do not require medical attention (report to your doctor or health care professional if they continue or are bothersome): -constipation -diarrhea -headache -joint pain -loss of appetite -muscle pain -runny nose -tiredness -upset stomach This list may not describe all possible side effects. Call your doctor for medical advice about side effects. You may report side effects to FDA at 1-800-FDA-1088. Where should I keep my medicine? This medicine is only given in a clinic, doctor's office, or other health care setting and will not be stored at home. NOTE: This sheet is a summary. It may not cover all possible information. If you have questions about this medicine, talk to your doctor, pharmacist, or health care provider.  2018 Elsevier/Gold Standard (2016-04-04 19:17:21) Letrozole tablets What is this medicine? LETROZOLE (LET roe zole) blocks the production of estrogen. It is used to treat breast cancer. This medicine may be used for other purposes; ask your health care provider or pharmacist if you have questions. COMMON BRAND NAME(S): Femara What should I tell my health care provider before I take this medicine? They need to know if you  have any of these conditions: -high cholesterol -liver disease -osteoporosis (weak bones) -an unusual or allergic reaction to letrozole, other medicines, foods, dyes, or preservatives -pregnant or trying to get pregnant -breast-feeding How should I use this medicine? Take this medicine by mouth with a glass of water. You may take it with or without food. Follow the directions on the prescription label. Take your medicine at regular intervals. Do not take your medicine more often than directed. Do not stop taking except on your doctor's advice. Talk to your pediatrician regarding the use of this medicine in children. Special care may be needed. Overdosage: If you think you have taken too much of this medicine contact a poison control center or emergency room at once. NOTE: This medicine is only for you. Do not share this medicine with others. What if I miss a dose? If you miss a dose, take it as soon as you can. If it is almost time for your next dose, take only that dose. Do not take double or extra doses. What may interact with this medicine? Do not take this medicine with any of the following medications: -estrogens, like hormone replacement therapy or birth control pills This medicine may also interact with the following medications: -dietary supplements such as androstenedione or DHEA -prasterone -tamoxifen This list may not describe all possible interactions. Give your health care provider a list of all the medicines, herbs, non-prescription  drugs, or dietary supplements you use. Also tell them if you smoke, drink alcohol, or use illegal drugs. Some items may interact with your medicine. What should I watch for while using this medicine? Tell your doctor or healthcare professional if your symptoms do not start to get better or if they get worse. Do not become pregnant while taking this medicine or for 3 weeks after stopping it. Women should inform their doctor if they wish to become  pregnant or think they might be pregnant. There is a potential for serious side effects to an unborn child. Talk to your health care professional or pharmacist for more information. Do not breast-feed while taking this medicine or for 3 weeks after stopping it. This medicine may interfere with the ability to have a child. Talk with your doctor or health care professional if you are concerned about your fertility. Using this medicine for a long time may increase your risk of low bone mass. Talk to your doctor about bone health. You may get drowsy or dizzy. Do not drive, use machinery, or do anything that needs mental alertness until you know how this medicine affects you. Do not stand or sit up quickly, especially if you are an older patient. This reduces the risk of dizzy or fainting spells. You may need blood work done while you are taking this medicine. What side effects may I notice from receiving this medicine? Side effects that you should report to your doctor or health care professional as soon as possible: -allergic reactions like skin rash, itching, or hives -bone fracture -chest pain -signs and symptoms of a blood clot such as breathing problems; changes in vision; chest pain; severe, sudden headache; pain, swelling, warmth in the leg; trouble speaking; sudden numbness or weakness of the face, arm or leg -vaginal bleeding Side effects that usually do not require medical attention (report to your doctor or health care professional if they continue or are bothersome): -bone, back, joint, or muscle pain -dizziness -fatigue -fluid retention -headache -hot flashes, night sweats -nausea -weight gain This list may not describe all possible side effects. Call your doctor for medical advice about side effects. You may report side effects to FDA at 1-800-FDA-1088. Where should I keep my medicine? Keep out of the reach of children. Store between 15 and 30 degrees C (59 and 86 degrees F). Throw  away any unused medicine after the expiration date. NOTE: This sheet is a summary. It may not cover all possible information. If you have questions about this medicine, talk to your doctor, pharmacist, or health care provider.  2018 Elsevier/Gold Standard (2015-10-18 11:10:41)

## 2017-01-17 NOTE — Progress Notes (Signed)
Patient has completed her radiation.  Last one was 2 weeks ago Monday.  Patient states she is fatigued.  Her husband has recently been placed in a rehabilitation facility and she states she is less stressed as she was his primary caretaker.  Patient will see Dr. Rochel Brome on 01-24-17.

## 2017-01-21 ENCOUNTER — Encounter: Payer: Self-pay | Admitting: Hematology and Oncology

## 2017-01-22 ENCOUNTER — Telehealth: Payer: Self-pay | Admitting: *Deleted

## 2017-01-22 NOTE — Telephone Encounter (Signed)
Patient called with complaints for new onset knee pain. She would like advice on whether she should continue her letrozole.

## 2017-01-22 NOTE — Telephone Encounter (Signed)
Called patient to inform her per MD - continue letrazole.  Patient verbalized understanding.

## 2017-01-22 NOTE — Telephone Encounter (Signed)
  Please continue letrozole.  M

## 2017-01-24 DIAGNOSIS — Z853 Personal history of malignant neoplasm of breast: Secondary | ICD-10-CM | POA: Diagnosis not present

## 2017-02-07 ENCOUNTER — Encounter: Payer: Self-pay | Admitting: Radiation Oncology

## 2017-02-07 ENCOUNTER — Ambulatory Visit
Admission: RE | Admit: 2017-02-07 | Discharge: 2017-02-07 | Disposition: A | Discharge status: Home / Self Care | Payer: PPO | Source: Ambulatory Visit | Attending: Radiation Oncology | Admitting: Radiation Oncology

## 2017-02-07 ENCOUNTER — Other Ambulatory Visit: Payer: Self-pay

## 2017-02-07 VITALS — BP 154/82 | HR 94 | Temp 97.2°F | Wt 208.7 lb

## 2017-02-07 DIAGNOSIS — C50411 Malignant neoplasm of upper-outer quadrant of right female breast: Secondary | ICD-10-CM | POA: Diagnosis not present

## 2017-02-07 DIAGNOSIS — Z170 Estrogen receptor positive status [ER+]: Secondary | ICD-10-CM | POA: Diagnosis not present

## 2017-02-07 DIAGNOSIS — Z79811 Long term (current) use of aromatase inhibitors: Secondary | ICD-10-CM | POA: Diagnosis not present

## 2017-02-07 DIAGNOSIS — Z923 Personal history of irradiation: Secondary | ICD-10-CM | POA: Insufficient documentation

## 2017-02-07 NOTE — Progress Notes (Signed)
Radiation Oncology Follow up Note  Name: Allison Hall   Date:   02/07/2017 MRN:  [id number] DOB: 09/05/1940    This 76 y.o. 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  REFERRING PROVIDER: Fowler, Vickie A, MD  HPI: Patient is a 76-year-old female now out 1 month having completed external whole breast radiation to her right breast for stage I ER/PR positive HER-2/neu negative invasive mammary carcinoma.. She is seen today in routine follow-up and is doing well. She specifically denies breast tenderness cough or bone pain. She is currently on Femara tolerate that well without side effect. She is scheduled to start Prolia and has been cleared by her dentist.    COMPLICATIONS OF TREATMENT: none  FOLLOW UP COMPLIANCE: keeps appointments   PHYSICAL EXAM:  BP (!) 154/82   Pulse 94   Temp (!) 97.2 F (36.2 C)   Wt 208 lb 10.7 oz (94.7 kg)   BMI 34.72 kg/m  Lungs are clear to A&P cardiac examination essentially unremarkable with regular rate and rhythm. No dominant mass or nodularity is noted in either breast in 2 positions examined. Incision is well-healed. No axillary or supraclavicular adenopathy is appreciated. Cosmetic result is excellent. Well-developed well-nourished patient in NAD. HEENT reveals PERLA, EOMI, discs not visualized.  Oral cavity is clear. No oral mucosal lesions are identified. Neck is clear without evidence of cervical or supraclavicular adenopathy. Lungs are clear to A&P. Cardiac examination is essentially unremarkable with regular rate and rhythm without murmur rub or thrill. Abdomen is benign with no organomegaly or masses noted. Motor sensory and DTR levels are equal and symmetric in the upper and lower extremities. Cranial nerves II through XII are grossly intact. Proprioception is intact. No peripheral adenopathy or  edema is identified. No motor or sensory levels are noted. Crude visual fields are within normal range.  RADIOLOGY RESULTS: No current films for review  PLAN: At the present time she is doing well 1 month out from whole breast radiation. I'm please were overall progress. I've asked to see her back in 4-5 months for follow-up. She continues on anti-estrogen therapy. Patient knows to call with any concerns.  I would like to take this opportunity to thank you for allowing me to participate in the care of your patient..    CHRYSTAL,GLENN S., MD   

## 2017-02-08 ENCOUNTER — Other Ambulatory Visit: Payer: Self-pay | Admitting: *Deleted

## 2017-02-08 MED ORDER — LETROZOLE 2.5 MG PO TABS: 2.5000 mg | ORAL_TABLET | Freq: Every day | ORAL | 0 refills | Status: DC

## 2017-02-09 DIAGNOSIS — F331 Major depressive disorder, recurrent, moderate: Secondary | ICD-10-CM | POA: Diagnosis not present

## 2017-02-13 ENCOUNTER — Other Ambulatory Visit: Payer: Self-pay | Admitting: Urgent Care

## 2017-02-13 ENCOUNTER — Telehealth: Payer: Self-pay | Admitting: *Deleted

## 2017-02-13 DIAGNOSIS — E669 Obesity, unspecified: Secondary | ICD-10-CM | POA: Diagnosis not present

## 2017-02-13 DIAGNOSIS — C50411 Malignant neoplasm of upper-outer quadrant of right female breast: Secondary | ICD-10-CM

## 2017-02-13 DIAGNOSIS — M1712 Unilateral primary osteoarthritis, left knee: Secondary | ICD-10-CM | POA: Diagnosis not present

## 2017-02-13 NOTE — Progress Notes (Signed)
[facility] day:  02/14/2017  Chief Complaint: Allison Hall is a 76 y.o. female with right breast cancer who is seen for 1 month assessment on Femara.   HPI:  The patient was last seen in the medical oncology clinic on 01/17/2017.  At that time, patient was doing well. She verbalizes no physical complaints.  CBC revealed hemoglobin 11.8, hematocrit 34.7, platelets 217,000.  She had mild renal insufficiency (BUN 23 and creatinine 1.08). We discussed the plan for future BCI testing to assess benefit of extended adjuvant hormonal therapy. She began Femara 2.5 mg daily.  As she has osteopenia, we discussed Prolia for aromatase inhibitor associated bone thinning.   Patient was seen by Dr. Baruch Gouty on 02/07/2017. She was doing well 1 month s/p whole breast radiation. She is scheduled to follow-up with radiation oncology in 4-5 months.  Patient was seen by Dr. Tamala Julian on 01/24/2017. He will see her again in 07/2017 following her mammogram.  Symptomatically, patient is doing well. She began Femara x 1 month ago. She denies significant side effects. Patient states, "I get hot. I may get hot flashes, but I don't know what they are". Patient denies breast concerns. She has not experienced any B symptoms or interval infections. Patient is eating well, with no weight loss demonstrated.    Past Medical History:  Diagnosis Date  . Anxiety   . Cancer ([place])    skin ca  . Colon polyp   . Depression   . GERD (gastroesophageal reflux disease)   . Hypercholesteremia   . Hypertension   . Osteoarthritis     Past Surgical History:  Procedure Laterality Date  . ABDOMINAL HYSTERECTOMY    . APPENDECTOMY    . JOINT REPLACEMENT    . NOSE SURGERY    . PARTIAL MASTECTOMY WITH NEEDLE LOCALIZATION Right 09/29/2016   Procedure: PARTIAL MASTECTOMY WITH NEEDLE LOCALIZATION;  Surgeon: [name], MD;  Location: [facility] ORS;  Service: General;  Laterality: Right;   . REPLACEMENT TOTAL KNEE Right 2008  . SENTINEL NODE BIOPSY Right 09/29/2016   Procedure: SENTINEL NODE BIOPSY;  Surgeon: [name], MD;  Location: [facility] ORS;  Service: General;  Laterality: Right;    Family History  Problem Relation Age of Onset  . Hypertension Mother   . Depression Mother   . Heart failure Father   . Obesity Brother   . Heart disease Brother   . Stroke Brother   . Depression Brother   . Breast cancer Cousin        2 mat and 1 pat cousins    Social History:  reports that she quit smoking about 51 years ago. She has a 10.00 pack-year smoking history. she has never used smokeless tobacco. She reports that she drinks about 2.4 oz of alcohol per week. She reports that she does not use drugs.  She previously smoked 1/2 pack/day x 10 years.  She stopped smoking on 10/11/1978.  She drinks 6 glasses of wine/week.  She has 3 children (2 sons and 1 daughter).  She is a retired Clinical cytogeneticist for [employer].  She likes to do crossword puzzles.  Her daughter's name is [name].  She lives in [place].  She is alone today.  Allergies:  Allergies  Allergen Reactions  . Sulfamethoxazole Other (See Comments)    Anxiety and imsomnia  . Statins Other (See Comments)    Muscle pain   . Trazodone Other (See Comments)  Pt states she felt "unsteady"   . Ciprofloxacin Anxiety  . Sulfa Antibiotics Anxiety    Current Medications: Current Outpatient Medications  Medication Sig Dispense Refill  . acetaminophen (TYLENOL) 500 MG tablet Take 500 mg by mouth 2 (two) times daily.    Marland Kitchen amLODipine (NORVASC) 2.5 MG tablet Take by mouth.    Marland Kitchen amLODipine (NORVASC) 5 MG tablet Take 5 mg by mouth daily.     Marland Kitchen buPROPion (WELLBUTRIN SR) 100 MG 12 hr tablet Take 1 tablet (100 mg total) by mouth daily. (Patient taking differently: Take 200 mg by mouth daily. ) 30 tablet 1  . busPIRone (BUSPAR) 10 MG tablet Take 10 mg by mouth 3 (three) times daily.    . Cholecalciferol (VITAMIN D) 2000 units CAPS  Take 4,000 Units by mouth daily.    Marland Kitchen letrozole (FEMARA) 2.5 MG tablet Take 1 tablet (2.5 mg total) daily by mouth. 90 tablet 0  . lisinopril (PRINIVIL,ZESTRIL) 20 MG tablet Take 30 mg by mouth daily. Takes 1.5 tablet    . loperamide (IMODIUM) 2 MG capsule Take 2 mg by mouth as needed for diarrhea or loose stools.     . Magnesium 250 MG TABS Take 2 tablets by mouth daily.    Marland Kitchen omeprazole (PRILOSEC) 40 MG capsule Take 20 mg by mouth.     . rosuvastatin (CRESTOR) 5 MG tablet Take 5 mg by mouth daily.    . sertraline (ZOLOFT) 100 MG tablet Take 1.5 tablets (150 mg total) by mouth daily. (Patient taking differently: Take 100-150 mg by mouth See admin instructions. Takes 100 mg daily except on Monday, Wednesday and Fridays and takes 150 mg (1.5 tablet)) 45 tablet 1  . Turmeric Curcumin 500 MG CAPS Take 1,500 mg by mouth daily.     Marland Kitchen UNABLE TO FIND Take 1,000 mg by mouth 3 (three) times daily. Hemp Oil capsules 1000 mg each Also contains Omega 3 and Omega 6 Fatty Acids     No current facility-administered medications for this visit.     Review of Systems:  GENERAL:  Feels well.  No fevers or sweats.  Weight up 5 pounds.  PERFORMANCE STATUS (ECOG):  0 HEENT:  No visual changes, runny nose, sore throat, mouth sores or tenderness. Lungs: No shortness of breath or cough.  No hemoptysis. Cardiac:  No chest pain, palpitations, orthopnea, or PND. GI:  No nausea, vomiting, diarrhea, constipation, melena or hematochezia. GU:  No urgency, frequency, dysuria, or hematuria. Musculoskeletal:  No back pain.  "Left knee bone on bone".  No muscle tenderness. Extremities:  No pain or swelling. Skin: No rashes or skin changes. Neuro:  Poor balance.  No headache, numbness or weakness, or coordination issues. Endocrine:  No diabetes or thyroid issues.  Some hot flashes (see HPI). Psych:  No mood changes, depression or anxiety. Pain:  No focal pain. Review of systems:  All other systems reviewed and found to be  negative.  Physical Exam: Blood pressure (!) 149/90, pulse 81, temperature 98.5 F (36.9 C), temperature source Tympanic, resp. rate 20, weight 210 lb 1.6 oz (95.3 kg). GENERAL:  Well developed, well nourished, woman sitting comfortably in the exam room in no acute distress. MENTAL STATUS:  Alert and oriented to person, place and time. HEAD:  Short black styled hair.  Normocephalic, atraumatic, face symmetric, no Cushingoid features. EYES:  Glasses.  Green eyes.  Pupils equal round and reactive to light and accomodation.  No conjunctivitis or scleral icterus. ENT:  Oropharynx clear without  lesion.  Tongue normal. Mucous membranes moist.  RESPIRATORY:  Clear to auscultation without rales, wheezes or rhonchi. CARDIOVASCULAR:  Regular rate and rhythm without murmur, rub or gallop. BREAST:  Right breast tender at 11 o'clock with post operative changes.  Skin with slight increased pigmentation.  Skin dry post radaition.  Left breast with tender fibrocystic changes in the lower inner quadrant and upper outer quadrant.  No masses, skin changes or nipple discharge.  ABDOMEN:  Soft, non-tender, with active bowel sounds, and no hepatosplenomegaly.  No masses. SKIN:  No rashes, ulcers or lesions. EXTREMITIES: No edema, no skin discoloration or tenderness.  No palpable cords. LYMPH NODES: No palpable cervical, supraclavicular, axillary or inguinal adenopathy  NEUROLOGICAL: Unremarkable. PSYCH:  Appropriate.   No visits with results within 3 Day(s) from this visit.  Latest known visit with results is:  Hospital Outpatient Visit on 08/24/2016  Component Date Value Ref Range Status  . SURGICAL PATHOLOGY 08/24/2016    Final-Edited                   Value:Surgical Pathology THIS IS AN ADDENDUM REPORT CASE: [id number] PATIENT: [facility] Surgical Pathology Report Addendum  Reason for Addendum #1:  Immunohistochemistry results  SPECIMEN SUBMITTED: A. Breast, right, 9:30 B. Breast,  right, 10:00  CLINICAL HISTORY: Masses  PRE-OPERATIVE DIAGNOSIS: IMC, possibly lobular  POST-OPERATIVE DIAGNOSIS: None provided.     DIAGNOSIS: A. BREAST, RIGHT 9:30; ULTRASOUND GUIDED BIOPSY: - INVASIVE MAMMARY CARCINOMA, DIFFUSELY INFILTRATIVE WITH LOBULAR-LIKE FEATURES.  Size of invasive carcinoma: 6 mm in this sample Histologic grade of invasive carcinoma: 2      Glandular/tubular differentiation score: 3      Nuclear pleomorphism score: 2      Mitotic rate score: 1      Total score: 6  Ductal carcinoma in situ: Not identified Lymphovascular invasion: Not identified  B. Breast, right 10:00; ULTRASOUND GUIDED BIOPSY: - INVASIVE MAMMARY CARCINOMA, WITH LOBULAR FEATURES.  Size                          of invasive carcinoma: 8 mm in this sample Histologic grade of invasive carcinoma: 2      Glandular/tubular differentiation score: 3      Nuclear pleomorphism score: 2      Mitotic rate score: 1      Total score: 6  Ductal carcinoma in situ: Not identified Lymphovascular invasion: Not identified  Comment: The definitive grade will be assigned on the excisional specimen. The carcinoma is the two biopsy specimens have identical morphology. ER/PR/HER2: Immunohistochemistry will be performed on block A1, with reflex to [place] for HER2 2+. The results will be reported in an addendum. These findings were communicated to [place] in Dr. Janace Hoard office on 08/25/2016. Read back procedure was performed.     GROSS DESCRIPTION:  A. The specimen is received in a formalin-filled container labeled with the patient's name and right breast 9:30, 7 cm from nipple.  Core pieces: multiple, aggregate 1.6 x 0.4 x 0.1 cm Comments: red yellow lobulated fibrofatty tissue, ma                         rked green  Entirely submitted in cassette(s): 1  Time/Date in fixative: placed in formalin at 1:40 PM on 08/24/2016 cold ischemic time of less than 2 minutes Total fixation  time: 6.7 Hours  B. The specimen is received in a formalin-filled container labeled with  the patient's name and right breast #2 10:00, 5 cm from nipple.  Core pieces: multiple, aggregate 1.7 x 0.5 x 0.1 cm Comments: yellow to pink lobulated fibrofatty, marked black  Entirely submitted in cassette(s): 1  Time/Date in fixative: placed in formalin at 1:50 PM on 08/24/2016 cold ischemic time of less than 2 minutes Total fixation time: 6.6 hours    Final Diagnosis performed by Quay Burow, MD.  Electronically signed 08/25/2016 2:54:14PM   The electronic signature indicates that the named Attending Pathologist has evaluated the specimen  Technical component performed at [facility], [address] [place], [place] [address] Lab: [phone] Dir: Darrick Penna. Evette Doffing, MD  Professional component perf                         ormed at [facility], [facility], [place], [place], [place] [address] Lab: [phone] Dir: Dellia Nims. Rubinas, MD   Breast Biomarker Reporting Template  BREAST BIOMARKER TESTS Estrogen Receptor (ER) Status: Positive, greater than 90%      Average intensity of staining: Strong Progesterone Receptor (PgR) Status: Positive, greater than 90%      Average intensity of staining: Strong HER2 (by immunohistochemistry): 2+ equivocal Percentage of cells with uniform intense complete membrane staining: 5% HER2 FISH pending.  METHODS Cold Ischemia and Fixation Times: Meet requirements specified in latest version of the ASCO/CAP guidelines Testing Performed on Block Number(s): A1 Fixative: Formalin Estrogen Receptor:  FDA cleared ([place])                    Primary Antibody:  SP1 Progesterone Receptor: FDA cleared ([place])                   Primary Antibody: 1E2 HER2 (by immunohistochemistry): FDA approved ([place])                             Prima                         ry Antibody: HercepTest  Immunohistochemistry controls worked  appropriately.  Immunohistochemistry controls worked appropriately. Slides were prepared by [facility], [place], [place], and interpreted by Quay Burow, MD.     Addendum #1 performed by Quay Burow, MD.  Electronically signed 08/30/2016 4:33:51PM    Technical component performed at [facility], [address], [place], [place] [address] Lab: [phone] Dir: Darrick Penna. Evette Doffing, MD  Professional component performed at [facility], [facility], [place], [place],  [address] Lab: [phone] Dir: Dellia Nims. Rubinas, MD      Assessment:  ZAHLI VETSCH is a 76 y.o. female with multi-focal right breast cancer s/p partial mastectomy with sentinel lymph node biopsy on 09/29/2016.  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  Three sentinel lymph nodes were negative.  Tumor was ER + (> 90%), PR + (> 90%) and Her2/neu 2+ (negative by FISH).  Pathologic stage was T1c(m) N0(sn).  CA27.29 was 13.4 on 09/06/2016.  MammaPrint revealed a 29% risk of recurrence (high).  She was seen for second opinion at [facility] on 10/25/2016.  Chemotherapy was not recommended.  Bilateral screening mammogram on 07/24/2016 revealed a possible area of distortion in the right breast. Diagnostic mammogram on 08/10/2016 revealed 2 suspicious masses in the upper-outer quadrant of the  right breast in a broad area of confluent distortion in the upper-outer quadrant of the right breast.  There are at least 3 areas of confluent distortion spanning approximately 6.5 cm of tissue.  Ultrasound on 07/31/2016 revealed  an irregular hypoechoic 1.2 x 1.1 x 1.2 cm at the 9:30 position 7 cm from the nipple.  In addition, there was an irregular 1.9 x 0.8 x 1.2 cm mass at the 10 o'clock position 5 cm from the nipple.  There were multiple small normal-appearing lymph nodes.   Breast  MRI on 09/16/2016 revealed an irregular 1.6 x 1.2 x 1.6 cm enhancing mass adjacent to the clip artifact within the upper, outer right breast, middle depth, corresponding to the mass seen at 10 o'clock. There was an additional irregular enhancing mass adjacent to the more posterior clip artifact measuring 2.6 x 1.4 x 1.6 cm, corresponding  to the mass seen at 9:30 position. Together, these 2 masses span 6.3 cm of breast tissue from anterior to posterior. There was no definite additional suspicious area of enhancement within the right breast to correspond to the third area of distortion identified on diagnostic mammogram dated 08/10/2016.  Left breast revealed no suspicious mass or enhancement.  She received radiation from 11/15/2016 - 01/08/2017.  She began Femara on 01/17/2017.  Bone density study on 09/30/2015 revealed osteopenia with a T-score of -2.1 in the right femoral neck.  Symptomatically, she is doing well on Femara. She denies breast concerns. Exam reveals post-operative and post radiation changes.  Plan: 1.  Labs today: CBC with diff, CMP.   2.  Continue Femara 2.5 mg daily.  Rx: 90 day supply with 3 refills. 3.  Discuss Prolia. Side effects reviewed. Dental clearance has been received. Patient wishes to proceed with injection today. 4.  Discuss calcium and vitamin supplementation while on aromatase inhibitor therapy and Prolia. Patient will continue calcium 1200 mg and vitamin D 800 IU daily. 5.  Bilateral mammogram in 07/2017 (ordered by Dr. Rochel Brome). 6.  Follow up with Dr. Tamala Julian as scheduled in 07/2017.  7.  RTC in 3 months for MD assessment, labs (CBC with diff, CMP).   Honor Loh, NP  02/14/2017, 10:52 AM   I saw and evaluated the patient, participating in the key portions of the service and reviewing pertinent diagnostic studies and records.  I reviewed the nurse practitioner's note and agree with the findings and the plan.  The assessment and plan were discussed with the  patient.  A few questions were asked by the patient and answered.   Nolon Stalls, MD 02/14/2017, 10:52 AM

## 2017-02-13 NOTE — Telephone Encounter (Signed)
Called patient and LVM that we will need dental clearance in order for her to get her prolia injection tomorrow in [place].  I informed her that I have not received clearance from her dentist.  If she has it - I asked that she bring it with her tomorrow.  If she does not have it she can have dentist fax it to [place] @ [place] [facility],  Left fax number for her.

## 2017-02-14 ENCOUNTER — Other Ambulatory Visit: Payer: Self-pay | Admitting: Urgent Care

## 2017-02-14 ENCOUNTER — Encounter: Payer: Self-pay | Admitting: Hematology and Oncology

## 2017-02-14 ENCOUNTER — Inpatient Hospital Stay: Payer: PPO

## 2017-02-14 ENCOUNTER — Other Ambulatory Visit: Payer: Self-pay | Admitting: *Deleted

## 2017-02-14 ENCOUNTER — Inpatient Hospital Stay: Payer: PPO | Attending: Hematology and Oncology | Admitting: Hematology and Oncology

## 2017-02-14 VITALS — BP 149/90 | HR 81 | Temp 98.5°F | Resp 20 | Wt 210.1 lb

## 2017-02-14 DIAGNOSIS — C50411 Malignant neoplasm of upper-outer quadrant of right female breast: Secondary | ICD-10-CM | POA: Diagnosis not present

## 2017-02-14 DIAGNOSIS — K219 Gastro-esophageal reflux disease without esophagitis: Secondary | ICD-10-CM | POA: Diagnosis not present

## 2017-02-14 DIAGNOSIS — Z79811 Long term (current) use of aromatase inhibitors: Secondary | ICD-10-CM | POA: Insufficient documentation

## 2017-02-14 DIAGNOSIS — M1990 Unspecified osteoarthritis, unspecified site: Secondary | ICD-10-CM | POA: Diagnosis not present

## 2017-02-14 DIAGNOSIS — F329 Major depressive disorder, single episode, unspecified: Secondary | ICD-10-CM | POA: Insufficient documentation

## 2017-02-14 DIAGNOSIS — Z9011 Acquired absence of right breast and nipple: Secondary | ICD-10-CM

## 2017-02-14 DIAGNOSIS — Z87891 Personal history of nicotine dependence: Secondary | ICD-10-CM | POA: Diagnosis not present

## 2017-02-14 DIAGNOSIS — Z79899 Other long term (current) drug therapy: Secondary | ICD-10-CM | POA: Diagnosis not present

## 2017-02-14 DIAGNOSIS — I10 Essential (primary) hypertension: Secondary | ICD-10-CM | POA: Diagnosis not present

## 2017-02-14 DIAGNOSIS — F419 Anxiety disorder, unspecified: Secondary | ICD-10-CM | POA: Insufficient documentation

## 2017-02-14 DIAGNOSIS — Z170 Estrogen receptor positive status [ER+]: Secondary | ICD-10-CM | POA: Diagnosis not present

## 2017-02-14 DIAGNOSIS — M8580 Other specified disorders of bone density and structure, unspecified site: Secondary | ICD-10-CM | POA: Insufficient documentation

## 2017-02-14 DIAGNOSIS — E7800 Pure hypercholesterolemia, unspecified: Secondary | ICD-10-CM | POA: Diagnosis not present

## 2017-02-14 DIAGNOSIS — Z853 Personal history of malignant neoplasm of breast: Secondary | ICD-10-CM

## 2017-02-14 LAB — CBC WITH DIFFERENTIAL/PLATELET
Basophils Absolute: 0 10*3/uL (ref 0–0.1)
Basophils Relative: 1 %
Eosinophils Absolute: 0 10*3/uL (ref 0–0.7)
Eosinophils Relative: 1 %
HCT: 32.9 % — ABNORMAL LOW (ref 35.0–47.0)
Hemoglobin: 11.1 g/dL — ABNORMAL LOW (ref 12.0–16.0)
Lymphocytes Relative: 20 %
Lymphs Abs: 1.1 10*3/uL (ref 1.0–3.6)
MCH: 29.4 pg (ref 26.0–34.0)
MCHC: 33.8 g/dL (ref 32.0–36.0)
MCV: 87 fL (ref 80.0–100.0)
Monocytes Absolute: 0.6 10*3/uL (ref 0.2–0.9)
Monocytes Relative: 11 %
Neutro Abs: 3.9 10*3/uL (ref 1.4–6.5)
Neutrophils Relative %: 67 %
Platelets: 242 10*3/uL (ref 150–440)
RBC: 3.78 MIL/uL — ABNORMAL LOW (ref 3.80–5.20)
RDW: 15.4 % — ABNORMAL HIGH (ref 11.5–14.5)
WBC: 5.7 10*3/uL (ref 3.6–11.0)

## 2017-02-14 LAB — COMPREHENSIVE METABOLIC PANEL
ALBUMIN: 4.7 g/dL (ref 3.5–5.0)
ALK PHOS: 86 U/L (ref 38–126)
ALT: 17 U/L (ref 14–54)
AST: 28 U/L (ref 15–41)
Anion gap: 9 (ref 5–15)
BILIRUBIN TOTAL: 0.4 mg/dL (ref 0.3–1.2)
BUN: 29 mg/dL — AB (ref 6–20)
CALCIUM: 9.1 mg/dL (ref 8.9–10.3)
CO2: 23 mmol/L (ref 22–32)
Chloride: 102 mmol/L (ref 101–111)
Creatinine, Ser: 1.05 mg/dL — ABNORMAL HIGH (ref 0.44–1.00)
GFR calc Af Amer: 58 mL/min — ABNORMAL LOW (ref >60)
GFR, EST NON AFRICAN AMERICAN: 50 mL/min — AB (ref >60)
GLUCOSE: 104 mg/dL — AB (ref 65–99)
POTASSIUM: 3.7 mmol/L (ref 3.5–5.1)
Sodium: 134 mmol/L — ABNORMAL LOW (ref 135–145)
TOTAL PROTEIN: 7.7 g/dL (ref 6.5–8.1)

## 2017-02-14 MED ORDER — DENOSUMAB 60 MG/ML ~~LOC~~ SOLN
60.0000 mg | Freq: Once | SUBCUTANEOUS | Status: AC
  Administered 2017-02-14: 60 mg via SUBCUTANEOUS

## 2017-02-14 MED ORDER — LETROZOLE 2.5 MG PO TABS: 2.5000 mg | ORAL_TABLET | Freq: Every day | ORAL | 3 refills | Status: DC

## 2017-02-14 NOTE — Patient Instructions (Signed)

## 2017-02-14 NOTE — Progress Notes (Signed)
Patient here today for Prolia injection.  She brought her dental clearance letter from Dr. Denton Brick, [place].  Offers no complaints today.

## 2017-02-28 ENCOUNTER — Ambulatory Visit (INDEPENDENT_AMBULATORY_CARE_PROVIDER_SITE_OTHER): Payer: PPO

## 2017-02-28 ENCOUNTER — Encounter: Payer: Self-pay | Admitting: Emergency Medicine

## 2017-02-28 ENCOUNTER — Ambulatory Visit
Admission: EM | Admit: 2017-02-28 | Discharge: 2017-02-28 | Disposition: A | Discharge status: Home / Self Care | Payer: PPO | Attending: Family Medicine | Admitting: Family Medicine

## 2017-02-28 ENCOUNTER — Other Ambulatory Visit: Payer: Self-pay

## 2017-02-28 DIAGNOSIS — M5431 Sciatica, right side: Secondary | ICD-10-CM

## 2017-02-28 DIAGNOSIS — R52 Pain, unspecified: Secondary | ICD-10-CM

## 2017-02-28 DIAGNOSIS — M25551 Pain in right hip: Secondary | ICD-10-CM | POA: Diagnosis not present

## 2017-02-28 MED ORDER — PREDNISONE 20 MG PO TABS: 20.0000 mg | ORAL_TABLET | Freq: Every day | ORAL | 0 refills | Status: DC

## 2017-02-28 MED ORDER — HYDROCODONE-ACETAMINOPHEN 5-325 MG PO TABS: 1.0000 | ORAL_TABLET | Freq: Four times a day (QID) | ORAL | 0 refills | Status: DC | PRN

## 2017-02-28 NOTE — ED Provider Notes (Signed)
[facility]    CSN: [id number] Arrival date & time: 02/28/17  0902     History   Chief Complaint Chief Complaint  Patient presents with  . Hip Pain    HPI Allison Hall is a 76 y.o. female.   The history is provided by the patient.  Hip Pain  This is a new problem. The current episode started yesterday. The problem occurs constantly. The problem has been gradually worsening. Pertinent negatives include no chest pain, no abdominal pain, no headaches and no shortness of breath. Associated symptoms comments: Right buttock pain. The symptoms are aggravated by walking and bending. Nothing relieves the symptoms. She has tried nothing for the symptoms.    Past Medical History:  Diagnosis Date  . Anxiety   . Cancer ([place])    skin ca  . Colon polyp   . Depression   . GERD (gastroesophageal reflux disease)   . Hypercholesteremia   . Hypertension   . Osteoarthritis     Patient Active Problem List   Diagnosis Date Noted  . Osteopenia 10/04/2016  . Breast cancer of upper-outer quadrant of right female breast ([place]) 08/24/2016  . Closed fracture of distal end of ulna 06/01/2015  . Triggering of digit 05/27/2014  . Hamstring muscle strain 05/26/2014  . Arthritis of knee, degenerative 03/26/2014  . Long term current use of opiate analgesic 04/21/2013  . Arthritis, degenerative 04/21/2013  . Anxiety 10/05/2011  . Colon polyp 10/05/2011  . Esophageal foreign body 10/05/2011  . Acid reflux 10/05/2011  . BP (high blood pressure) 10/05/2011  . Hypercholesterolemia 10/05/2011    Past Surgical History:  Procedure Laterality Date  . ABDOMINAL HYSTERECTOMY    . APPENDECTOMY    . JOINT REPLACEMENT    . NOSE SURGERY    . PARTIAL MASTECTOMY WITH NEEDLE LOCALIZATION Right 09/29/2016   Procedure: PARTIAL MASTECTOMY WITH NEEDLE LOCALIZATION;  Surgeon: [name], MD;  Location: [facility] ORS;  Service: General;  Laterality: Right;  . REPLACEMENT TOTAL KNEE Right  2008  . SENTINEL NODE BIOPSY Right 09/29/2016   Procedure: SENTINEL NODE BIOPSY;  Surgeon: [name], MD;  Location: [facility] ORS;  Service: General;  Laterality: Right;    OB History    No data available       Home Medications    Prior to Admission medications   Medication Sig Start Date End Date Taking? Authorizing Provider  acetaminophen (TYLENOL) 500 MG tablet Take 500 mg by mouth 2 (two) times daily.   Yes [provider]  amLODipine (NORVASC) 2.5 MG tablet Take by mouth. 10/16/16 10/16/17 Yes [provider]  amLODipine (NORVASC) 5 MG tablet Take 5 mg by mouth daily.  10/20/14 02/28/17 Yes [provider]  buPROPion (WELLBUTRIN SR) 100 MG 12 hr tablet Take 1 tablet (100 mg total) by mouth daily. Patient taking differently: Take 200 mg by mouth daily.  06/07/15 02/28/17 Yes Ravi, Himabindu, MD  busPIRone (BUSPAR) 10 MG tablet Take 10 mg by mouth 3 (three) times daily. 04/17/16  Yes [provider]  calcium carbonate 1250 MG capsule Take 1,250 mg by mouth 2 (two) times daily with a meal.   Yes [provider]  Cholecalciferol (VITAMIN D) 2000 units CAPS Take 4,000 Units by mouth daily.   Yes [provider]  denosumab (PROLIA) 60 MG/ML SOLN injection Inject 60 mg into the skin every 6 (six) months. Administer in upper arm, thigh, or abdomen   Yes [provider]  letrozole [facility])  2.5 MG tablet Take 1 tablet (2.5 mg total) by mouth daily. 02/14/17  Yes Karen Kitchens, NP  lisinopril (PRINIVIL,ZESTRIL) 20 MG tablet Take 30 mg by mouth daily. Takes 1.5 tablet 04/29/15 02/28/17 Yes [provider]  loperamide (IMODIUM) 2 MG capsule Take 2 mg by mouth as needed for diarrhea or loose stools.    Yes [provider]  Magnesium 250 MG TABS Take 2 tablets by mouth daily.   Yes [provider]  omeprazole (PRILOSEC) 40 MG capsule Take 20 mg by mouth.  10/20/14 02/28/17 Yes [provider]  rosuvastatin  (CRESTOR) 5 MG tablet Take 5 mg by mouth daily. 04/17/16 04/17/17 Yes [provider]  sertraline (ZOLOFT) 100 MG tablet Take 1.5 tablets (150 mg total) by mouth daily. Patient taking differently: Take 100-150 mg by mouth See admin instructions. Takes 100 mg daily except on Monday, Wednesday and Fridays and takes 150 mg (1.5 tablet) 05/19/15 02/28/17 Yes Ravi, Himabindu, MD  Turmeric Curcumin 500 MG CAPS Take 1,500 mg by mouth daily.    Yes [provider]  UNABLE TO FIND Take 1,000 mg by mouth 3 (three) times daily. Hemp Oil capsules 1000 mg each Also contains Omega 3 and Omega 6 Fatty Acids   Yes [provider]  HYDROcodone-acetaminophen (NORCO/VICODIN) 5-325 MG tablet Take 1-2 tablets by mouth every 6 (six) hours as needed. 02/28/17   Norval Gable, MD  predniSONE (DELTASONE) 20 MG tablet Take 1 tablet (20 mg total) by mouth daily. 02/28/17   Norval Gable, MD    Family History Family History  Problem Relation Age of Onset  . Hypertension Mother   . Depression Mother   . Heart failure Father   . Obesity Brother   . Heart disease Brother   . Stroke Brother   . Depression Brother   . Breast cancer Cousin        2 mat and 1 pat cousins    Social History Social History   Tobacco Use  . Smoking status: Former Smoker    Packs/day: 1.00    Years: 10.00    Pack years: 10.00    Last attempt to quit: 05/18/1965    Years since quitting: 51.8  . Smokeless tobacco: Never Used  Substance Use Topics  . Alcohol use: Yes    Alcohol/week: 4.2 oz    Types: 7 Glasses of wine per week  . Drug use: No     Allergies   Sulfamethoxazole; Statins; Trazodone; Ciprofloxacin; and Sulfa antibiotics   Review of Systems Review of Systems  Respiratory: Negative for shortness of breath.   Cardiovascular: Negative for chest pain.  Gastrointestinal: Negative for abdominal pain.  Neurological: Negative for headaches.     Physical Exam Triage Vital Signs ED Triage Vitals   Enc Vitals Group     BP 02/28/17 0949 136/75     Pulse Rate 02/28/17 0949 82     Resp 02/28/17 0949 16     Temp 02/28/17 0949 98.1 F (36.7 C)     Temp Source 02/28/17 0949 Oral     SpO2 02/28/17 0949 98 %     Weight 02/28/17 0949 200 lb (90.7 kg)     Height 02/28/17 0949 5\' 5"  (1.651 m)     Head Circumference --      Peak Flow --      Pain Score 02/28/17 0950 3     Pain Loc --      Pain Edu? --  Excl. in GC? --    No data found.  Updated Vital Signs BP 136/75 (BP Location: Left Arm)   Pulse 82   Temp 98.1 F (36.7 C) (Oral)   Resp 16   Ht 5\' 5"  (1.651 m)   Wt 200 lb (90.7 kg)   SpO2 98%   BMI 33.28 kg/m   Visual Acuity Right Eye Distance:   Left Eye Distance:   Bilateral Distance:    Right Eye Near:   Left Eye Near:    Bilateral Near:     Physical Exam  Constitutional: She appears well-developed and well-nourished. No distress.  Musculoskeletal:       Right hip: She exhibits tenderness. She exhibits normal range of motion, normal strength, no bony tenderness, no swelling, no crepitus, no deformity and no laceration.       Lumbar back: She exhibits tenderness (over the right buttock). She exhibits normal range of motion, no bony tenderness, no swelling, no edema, no deformity, no laceration, no pain, no spasm and normal pulse.  Skin: She is not diaphoretic.  Nursing note and vitals reviewed.    UC Treatments / Results  Labs (all labs ordered are listed, but only abnormal results are displayed) Labs Reviewed - No data to display  EKG  EKG Interpretation None       Radiology Dg Hip Unilat With Pelvis 2-3 Views Right  Result Date: 02/28/2017 CLINICAL DATA:  Posterior hip pain on the right beginning yesterday morning. EXAM: DG HIP (WITH OR WITHOUT PELVIS) 2-3V RIGHT COMPARISON:  None. FINDINGS: No evidence of hip or pelvic ring fracture. Osteopenia. No degenerative hip narrowing or spurring. 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 IMPRESSION: 1. No acute finding. 2. 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 3. Osteopenia. Electronically Signed   By: Monte Fantasia M.D.   On: 02/28/2017 10:43    Procedures Procedures (including critical care time)  Medications Ordered in UC Medications - No data to display   Initial Impression / Assessment and Plan / UC Course  I have reviewed the triage vital signs and the nursing notes.  Pertinent labs & imaging results that were available during my care of the patient were reviewed by me and considered in my medical decision making (see chart for details).       Final Clinical Impressions(s) / UC Diagnoses   Final diagnoses:  Pain  Sciatica of right side    ED Discharge Orders        Ordered    predniSONE (DELTASONE) 20 MG tablet  Daily     02/28/17 1117    HYDROcodone-acetaminophen (NORCO/VICODIN) 5-325 MG tablet  Every 6 hours PRN     02/28/17 1117     1. x-ray results and diagnosis reviewed with patient 2. rx as per orders above; reviewed possible side effects, interactions, risks and benefits  3. Recommend supportive treatment with otc analgesics prn 4. Follow-up prn if symptoms worsen or don't improve   Controlled Substance Prescriptions [place] Controlled Substance Registry consulted? Geraldo Pitter, MD 02/28/17 1136

## 2017-02-28 NOTE — ED Triage Notes (Signed)
Patient in today c/o right hip pain directly under the buttock with some radiation into upper thigh. Patient has used Tylenol this morning.

## 2017-05-16 ENCOUNTER — Inpatient Hospital Stay: Payer: Medicare HMO | Attending: Hematology and Oncology | Admitting: Hematology and Oncology

## 2017-05-16 ENCOUNTER — Inpatient Hospital Stay: Payer: Medicare HMO

## 2017-05-16 ENCOUNTER — Other Ambulatory Visit: Payer: Self-pay | Admitting: Hematology and Oncology

## 2017-05-16 ENCOUNTER — Encounter: Payer: Self-pay | Admitting: Hematology and Oncology

## 2017-05-16 VITALS — BP 135/79 | HR 93 | Temp 98.1°F | Resp 18 | Wt 207.0 lb

## 2017-05-16 DIAGNOSIS — C50411 Malignant neoplasm of upper-outer quadrant of right female breast: Secondary | ICD-10-CM

## 2017-05-16 DIAGNOSIS — Z170 Estrogen receptor positive status [ER+]: Secondary | ICD-10-CM

## 2017-05-16 DIAGNOSIS — R5383 Other fatigue: Secondary | ICD-10-CM | POA: Insufficient documentation

## 2017-05-16 DIAGNOSIS — Z87891 Personal history of nicotine dependence: Secondary | ICD-10-CM | POA: Insufficient documentation

## 2017-05-16 DIAGNOSIS — M25569 Pain in unspecified knee: Secondary | ICD-10-CM | POA: Insufficient documentation

## 2017-05-16 DIAGNOSIS — Z923 Personal history of irradiation: Secondary | ICD-10-CM | POA: Diagnosis not present

## 2017-05-16 DIAGNOSIS — Z79899 Other long term (current) drug therapy: Secondary | ICD-10-CM | POA: Insufficient documentation

## 2017-05-16 DIAGNOSIS — Z79811 Long term (current) use of aromatase inhibitors: Secondary | ICD-10-CM | POA: Diagnosis not present

## 2017-05-16 DIAGNOSIS — M25519 Pain in unspecified shoulder: Secondary | ICD-10-CM | POA: Diagnosis not present

## 2017-05-16 DIAGNOSIS — Z79891 Long term (current) use of opiate analgesic: Secondary | ICD-10-CM

## 2017-05-16 DIAGNOSIS — M85851 Other specified disorders of bone density and structure, right thigh: Secondary | ICD-10-CM

## 2017-05-16 DIAGNOSIS — M542 Cervicalgia: Secondary | ICD-10-CM | POA: Diagnosis not present

## 2017-05-16 DIAGNOSIS — C50911 Malignant neoplasm of unspecified site of right female breast: Secondary | ICD-10-CM | POA: Insufficient documentation

## 2017-05-16 DIAGNOSIS — M2550 Pain in unspecified joint: Secondary | ICD-10-CM

## 2017-05-16 LAB — COMPREHENSIVE METABOLIC PANEL
ALT: 16 U/L (ref 14–54)
AST: 25 U/L (ref 15–41)
Albumin: 4.8 g/dL (ref 3.5–5.0)
Alkaline Phosphatase: 48 U/L (ref 38–126)
Anion gap: 8 (ref 5–15)
BUN: 29 mg/dL — ABNORMAL HIGH (ref 6–20)
CO2: 24 mmol/L (ref 22–32)
Calcium: 8.7 mg/dL — ABNORMAL LOW (ref 8.9–10.3)
Chloride: 102 mmol/L (ref 101–111)
Creatinine, Ser: 1.08 mg/dL — ABNORMAL HIGH (ref 0.44–1.00)
GFR calc Af Amer: 55 mL/min — ABNORMAL LOW (ref >60)
GFR calc non Af Amer: 48 mL/min — ABNORMAL LOW (ref >60)
Glucose, Bld: 105 mg/dL — ABNORMAL HIGH (ref 65–99)
Potassium: 4 mmol/L (ref 3.5–5.1)
Sodium: 134 mmol/L — ABNORMAL LOW (ref 135–145)
Total Bilirubin: 0.5 mg/dL (ref 0.3–1.2)
Total Protein: 7.7 g/dL (ref 6.5–8.1)

## 2017-05-16 LAB — CBC WITH DIFFERENTIAL/PLATELET
Basophils Absolute: 0.1 10*3/uL (ref 0–0.1)
Basophils Relative: 1 %
Eosinophils Absolute: 0.1 10*3/uL (ref 0–0.7)
Eosinophils Relative: 2 %
HCT: 35.1 % (ref 35.0–47.0)
Hemoglobin: 11.9 g/dL — ABNORMAL LOW (ref 12.0–16.0)
Lymphocytes Relative: 27 %
Lymphs Abs: 1.6 10*3/uL (ref 1.0–3.6)
MCH: 29.3 pg (ref 26.0–34.0)
MCHC: 33.8 g/dL (ref 32.0–36.0)
MCV: 86.7 fL (ref 80.0–100.0)
Monocytes Absolute: 0.7 10*3/uL (ref 0.2–0.9)
Monocytes Relative: 12 %
Neutro Abs: 3.5 10*3/uL (ref 1.4–6.5)
Neutrophils Relative %: 58 %
Platelets: 295 10*3/uL (ref 150–440)
RBC: 4.05 MIL/uL (ref 3.80–5.20)
RDW: 15.3 % — ABNORMAL HIGH (ref 11.5–14.5)
WBC: 6 10*3/uL (ref 3.6–11.0)

## 2017-05-16 MED ORDER — ANASTROZOLE 1 MG PO TABS: 1.0000 mg | ORAL_TABLET | Freq: Every day | ORAL | 0 refills | Status: DC

## 2017-05-16 NOTE — Progress Notes (Signed)
Patient here today for follow up regarding breast cancer.  States she is fatigued and depressed.  Patient has placed her husband in [place]. He has been in the hospital and rehab twice since November.  States her hair has changed.  She has always had curly hair and now is straight.

## 2017-05-16 NOTE — Progress Notes (Signed)
[facility] day:  05/16/2017  Chief Complaint: Allison Hall is a 77 y.o. female with right breast cancer who is seen for 3 month assessment on Femara.   HPI:  The patient was last seen in the medical oncology clinic on 02/14/2017.  At that time, patient was doing well.  She denied any breast concerns. Exam revealed post-operative and post radiation changes.  During the interim, she has noted fatigue. She notes that her hair is "straight" since she started the Femara. She is unsure if her symptoms are related to her hormonal therapy. Patient under a great deal of stress related to her husband being placed in an ALF.   She has pain in her RIGHT shoulder, clavicle area, and knees. She is using APAP as needed to help with her pain. Patient denies breast concerns. She has not experienced any B symptoms or recent infections. Patient is eating well. Her weight has decreased 3 pounds.  She denies pain in the clinic today.  Patient does not perform monthly self breast examinations as recommended.    Past Medical History:  Diagnosis Date  . Anxiety   . Cancer ([place])    skin ca  . Colon polyp   . Depression   . GERD (gastroesophageal reflux disease)   . Hypercholesteremia   . Hypertension   . Osteoarthritis     Past Surgical History:  Procedure Laterality Date  . ABDOMINAL HYSTERECTOMY    . APPENDECTOMY    . JOINT REPLACEMENT    . NOSE SURGERY    . PARTIAL MASTECTOMY WITH NEEDLE LOCALIZATION Right 09/29/2016   Procedure: PARTIAL MASTECTOMY WITH NEEDLE LOCALIZATION;  Surgeon: [name], MD;  Location: [facility] ORS;  Service: General;  Laterality: Right;  . REPLACEMENT TOTAL KNEE Right 2008  . SENTINEL NODE BIOPSY Right 09/29/2016   Procedure: SENTINEL NODE BIOPSY;  Surgeon: [name], MD;  Location: [facility] ORS;  Service: General;  Laterality: Right;    Family History  Problem Relation Age of Onset  . Hypertension Mother    . Depression Mother   . Heart failure Father   . Obesity Brother   . Heart disease Brother   . Stroke Brother   . Depression Brother   . Breast cancer Cousin        2 mat and 1 pat cousins    Social History:  reports that she quit smoking about 52 years ago. She has a 10.00 pack-year smoking history. she has never used smokeless tobacco. She reports that she drinks about 4.2 oz of alcohol per week. She reports that she does not use drugs.  She previously smoked 1/2 pack/day x 10 years.  She stopped smoking on 10/11/1978.  She drinks 6 glasses of wine/week.  She has 3 children (2 sons and 1 daughter).  She is a retired Clinical cytogeneticist for [employer].  She likes to do crossword puzzles.  Her daughter's name is [name].  Her husband is in an assisted living facility.  She lives in [place].  She is alone today.  Allergies:  Allergies  Allergen Reactions  . Sulfamethoxazole Other (See Comments)    Anxiety and imsomnia  . Statins Other (See Comments)    Muscle pain   . Trazodone Other (See Comments)    Pt states she felt "unsteady"   . Ciprofloxacin Anxiety  . Sulfa Antibiotics Anxiety    Current Medications: Current Outpatient Medications  Medication Sig Dispense Refill  . acetaminophen (TYLENOL)  500 MG tablet Take 500 mg by mouth 2 (two) times daily.    Marland Kitchen amLODipine (NORVASC) 5 MG tablet Take 5 mg by mouth daily.     Marland Kitchen buPROPion (WELLBUTRIN SR) 100 MG 12 hr tablet Take 1 tablet (100 mg total) by mouth daily. (Patient taking differently: Take 200 mg by mouth daily. ) 30 tablet 1  . busPIRone (BUSPAR) 10 MG tablet Take 10 mg by mouth 3 (three) times daily.    . calcium carbonate 1250 MG capsule Take 1,250 mg by mouth 2 (two) times daily with a meal.    . denosumab (PROLIA) 60 MG/ML SOLN injection Inject 60 mg into the skin every 6 (six) months. Administer in upper arm, thigh, or abdomen    . letrozole (FEMARA) 2.5 MG tablet Take 1 tablet (2.5 mg total) by mouth daily. 90 tablet 3  . lisinopril  (PRINIVIL,ZESTRIL) 20 MG tablet Take 30 mg by mouth daily. Takes 1.5 tablet    . loperamide (IMODIUM) 2 MG capsule Take 2 mg by mouth as needed for diarrhea or loose stools.     . Magnesium 250 MG TABS Take 2 tablets by mouth daily.    Marland Kitchen omeprazole (PRILOSEC) 40 MG capsule Take 20 mg by mouth.     . rosuvastatin (CRESTOR) 5 MG tablet Take 5 mg by mouth daily.    . sertraline (ZOLOFT) 100 MG tablet Take 1.5 tablets (150 mg total) by mouth daily. (Patient taking differently: Take 100-150 mg by mouth See admin instructions. Takes 100 mg daily except on Monday, Wednesday and Fridays and takes 150 mg (1.5 tablet)) 45 tablet 1  . Turmeric Curcumin 500 MG CAPS Take 1,500 mg by mouth daily.     Marland Kitchen UNABLE TO FIND Take 1,000 mg by mouth 3 (three) times daily. Hemp Oil capsules 1000 mg each Also contains Omega 3 and Omega 6 Fatty Acids     No current facility-administered medications for this visit.     Review of Systems:  GENERAL:  Feels fatigued.  No fevers or sweats.  Weight down 3 pounds.  PERFORMANCE STATUS (ECOG):  0 HEENT:  No visual changes, runny nose, sore throat, mouth sores or tenderness. Lungs: No shortness of breath or cough.  No hemoptysis. Cardiac:  No chest pain, palpitations, orthopnea, or PND. GI:  No nausea, vomiting, diarrhea, constipation, melena or hematochezia. GU:  No urgency, frequency, dysuria, or hematuria. Musculoskeletal:   Pain in her RIGHT shoulder, clavicle area.  No back pain.  "Left knee bone on bone".  No muscle tenderness. Extremities:  No pain or swelling. Skin: No rashes or skin changes. Neuro:  Poor balance.  No headache, numbness or weakness, or coordination issues. Endocrine:  No diabetes or thyroid issues.  Some hot flashes (see HPI). Psych:  Stress secondary to husband.  No mood changes, depression or anxiety. Pain:  No focal pain. Review of systems:  All other systems reviewed and found to be negative.  Physical Exam: Blood pressure 135/79, pulse 93,  temperature 98.1 F (36.7 C), temperature source Tympanic, resp. rate 18, weight 207 lb 0.2 oz (93.9 kg). GENERAL:  Well developed, well nourished, woman sitting comfortably in the exam room in no acute distress. MENTAL STATUS:  Alert and oriented to person, place and time. HEAD:  Short black styled hair.  Normocephalic, atraumatic, face symmetric, no Cushingoid features. EYES:  Glasses.  Green eyes.  Pupils equal round and reactive to light and accomodation.  No conjunctivitis or scleral icterus. ENT:  Oropharynx clear  without lesion.  Tongue normal. Mucous membranes moist.  RESPIRATORY:  Clear to auscultation without rales, wheezes or rhonchi. CARDIOVASCULAR:  Regular rate and rhythm without murmur, rub or gallop. BREAST:  Right breast tender at 11 o'clock with post operative changes.  Skin with slight increased pigmentation.  Left breast with tender fibrocystic changes in the lower inner quadrant and upper outer quadrant.  No masses, skin changes or nipple discharge.  ABDOMEN:  Soft, non-tender, with active bowel sounds, and no hepatosplenomegaly.  No masses. SKIN:  No rashes, ulcers or lesions. EXTREMITIES: No edema, no skin discoloration or tenderness.  No palpable cords. LYMPH NODES: No palpable cervical, supraclavicular, axillary or inguinal adenopathy  NEUROLOGICAL: Unremarkable. PSYCH:  Appropriate.   No visits with results within 3 Day(s) from this visit.  Latest known visit with results is:  Hospital Outpatient Visit on 08/24/2016  Component Date Value Ref Range Status  . SURGICAL PATHOLOGY 08/24/2016    Final-Edited                   Value:Surgical Pathology THIS IS AN ADDENDUM REPORT CASE: [id number] PATIENT: [facility] Surgical Pathology Report Addendum  Reason for Addendum #1:  Immunohistochemistry results  SPECIMEN SUBMITTED: A. Breast, right, 9:30 B. Breast, right, 10:00  CLINICAL HISTORY: Masses  PRE-OPERATIVE DIAGNOSIS: IMC, possibly  lobular  POST-OPERATIVE DIAGNOSIS: None provided.     DIAGNOSIS: A. BREAST, RIGHT 9:30; ULTRASOUND GUIDED BIOPSY: - INVASIVE MAMMARY CARCINOMA, DIFFUSELY INFILTRATIVE WITH LOBULAR-LIKE FEATURES.  Size of invasive carcinoma: 6 mm in this sample Histologic grade of invasive carcinoma: 2      Glandular/tubular differentiation score: 3      Nuclear pleomorphism score: 2      Mitotic rate score: 1      Total score: 6  Ductal carcinoma in situ: Not identified Lymphovascular invasion: Not identified  B. Breast, right 10:00; ULTRASOUND GUIDED BIOPSY: - INVASIVE MAMMARY CARCINOMA, WITH LOBULAR FEATURES.  Size                          of invasive carcinoma: 8 mm in this sample Histologic grade of invasive carcinoma: 2      Glandular/tubular differentiation score: 3      Nuclear pleomorphism score: 2      Mitotic rate score: 1      Total score: 6  Ductal carcinoma in situ: Not identified Lymphovascular invasion: Not identified  Comment: The definitive grade will be assigned on the excisional specimen. The carcinoma is the two biopsy specimens have identical morphology. ER/PR/HER2: Immunohistochemistry will be performed on block A1, with reflex to [place] for HER2 2+. The results will be reported in an addendum. These findings were communicated to [place] in Dr. Janace Hoard office on 08/25/2016. Read back procedure was performed.     GROSS DESCRIPTION:  A. The specimen is received in a formalin-filled container labeled with the patient's name and right breast 9:30, 7 cm from nipple.  Core pieces: multiple, aggregate 1.6 x 0.4 x 0.1 cm Comments: red yellow lobulated fibrofatty tissue, ma                         rked green  Entirely submitted in cassette(s): 1  Time/Date in fixative: placed in formalin at 1:40 PM on 08/24/2016 cold ischemic time of less than 2 minutes Total fixation time: 6.7 Hours  B. The specimen is received in a formalin-filled container  labeled with the patient's name and  right breast #2 10:00, 5 cm from nipple.  Core pieces: multiple, aggregate 1.7 x 0.5 x 0.1 cm Comments: yellow to pink lobulated fibrofatty, marked black  Entirely submitted in cassette(s): 1  Time/Date in fixative: placed in formalin at 1:50 PM on 08/24/2016 cold ischemic time of less than 2 minutes Total fixation time: 6.6 hours    Final Diagnosis performed by Quay Burow, MD.  Electronically signed 08/25/2016 2:54:14PM   The electronic signature indicates that the named Attending Pathologist has evaluated the specimen  Technical component performed at [facility], [address] [place], [place] [address] Lab: [phone] Dir: Darrick Penna. Evette Doffing, MD  Professional component perf                         ormed at [facility], [facility], [place], [place], [place] [address] Lab: [phone] Dir: Dellia Nims. Rubinas, MD   Breast Biomarker Reporting Template  BREAST BIOMARKER TESTS Estrogen Receptor (ER) Status: Positive, greater than 90%      Average intensity of staining: Strong Progesterone Receptor (PgR) Status: Positive, greater than 90%      Average intensity of staining: Strong HER2 (by immunohistochemistry): 2+ equivocal Percentage of cells with uniform intense complete membrane staining: 5% HER2 FISH pending.  METHODS Cold Ischemia and Fixation Times: Meet requirements specified in latest version of the ASCO/CAP guidelines Testing Performed on Block Number(s): A1 Fixative: Formalin Estrogen Receptor:  FDA cleared ([place])                    Primary Antibody:  SP1 Progesterone Receptor: FDA cleared ([place])                   Primary Antibody: 1E2 HER2 (by immunohistochemistry): FDA approved ([place])                             Prima                         ry Antibody: HercepTest  Immunohistochemistry controls worked appropriately.  Immunohistochemistry controls worked appropriately. Slides were  prepared by [facility] [place], [place], and interpreted by Quay Burow, MD.     Addendum #1 performed by Quay Burow, MD.  Electronically signed 08/30/2016 4:33:51PM    Technical component performed at [facility], [address], [place], [place] [address] Lab: [phone] Dir: Darrick Penna. Evette Doffing, MD  Professional component performed at [facility], [facility], [place], [place], [place] [address] Lab: [phone] Dir: Dellia Nims. Rubinas, MD      Assessment:  Allison Hall is a 77 y.o. female with multi-focal right breast cancer s/p partial mastectomy with sentinel lymph node biopsy on 09/29/2016.  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  Three sentinel lymph nodes were negative.  Tumor was ER + (> 90%), PR + (> 90%) and Her2/neu 2+ (negative by FISH).  Pathologic stage was T1c(m) N0(sn).  CA27.29 was 13.4 on 09/06/2016.  MammaPrint revealed a 29% risk of recurrence (high).  She was seen for second opinion at [facility] on 10/25/2016.  Chemotherapy was not recommended.  Bilateral screening mammogram on 07/24/2016 revealed a possible area of distortion in the right breast. Diagnostic mammogram on 08/10/2016 revealed 2 suspicious masses in the upper-outer quadrant of the right breast in a  broad area of confluent distortion in the upper-outer quadrant of the right breast.  There are at least 3 areas of confluent distortion spanning approximately 6.5 cm of tissue.  Ultrasound on 07/31/2016 revealed  an irregular hypoechoic 1.2 x 1.1 x 1.2 cm at the 9:30 position 7 cm from the nipple.  In addition, there was an irregular 1.9 x 0.8 x 1.2 cm mass at the 10 o'clock position 5 cm from the nipple.  There were multiple small normal-appearing lymph nodes.   Breast MRI on 09/16/2016 revealed an irregular 1.6 x 1.2 x 1.6 cm enhancing mass adjacent  to the clip artifact within the upper, outer right breast, middle depth, corresponding to the mass seen at 10 o'clock. There was an additional irregular enhancing mass adjacent to the more posterior clip artifact measuring 2.6 x 1.4 x 1.6 cm, corresponding  to the mass seen at 9:30 position. Together, these 2 masses span 6.3 cm of breast tissue from anterior to posterior. There was no definite additional suspicious area of enhancement within the right breast to correspond to the third area of distortion identified on diagnostic mammogram dated 08/10/2016.  Left breast revealed no suspicious mass or enhancement.  She received radiation from 11/15/2016 - 01/08/2017.  She began Femara on 01/17/2017.  CA27.29 has been followed:  13.4 on 09/06/2016 and 13.0 on 05/16/2017.  Bone density study on 09/30/2015 revealed osteopenia with a T-score of -2.1 in the right femoral neck.  She began Prolia on 02/14/2017.  Symptomatically, patient is fatigued. She expresses concerns with her hair being straight on Femara. She has joint pains. She denies breast concerns. Exam reveals post-operative and post radiation changes.  Plan: 1.  Labs today: CBC with diff, CMP, CA27.29.   2.  Discontinue Femara due to fatigue, changes in hair, and polyarthralgias. 3.  Start anastrozole 1 mg daily (Disp #30). Patient to call to discuss tolerance.  4.  Continue calcium and vitamin D. 5.  Bilateral mammogram in 07/2017. Study to be ordered by surgery per patient report.  6.  RTC on 08/15/2017 for labs (BMP) and Prolia. 7.  RTC in 3 months for MD assessment and labs (CBC with diff, CMP, CA27.29).   Honor Loh, NP  05/16/2017, 3:02 PM   I saw and evaluated the patient, participating in the key portions of the service and reviewing pertinent diagnostic studies and records.  I reviewed the nurse practitioner's note and agree with the findings and the plan.  The assessment and plan were discussed with the patient.  A few questions  were asked by the patient and answered.   Nolon Stalls, MD 05/16/2017, 3:02 PM

## 2017-05-17 LAB — CANCER ANTIGEN 27.29: CA 27.29: 13 U/mL (ref 0.0–38.6)

## 2017-05-30 ENCOUNTER — Other Ambulatory Visit: Payer: Self-pay | Admitting: General Surgery

## 2017-05-30 DIAGNOSIS — Z853 Personal history of malignant neoplasm of breast: Secondary | ICD-10-CM

## 2017-06-03 DIAGNOSIS — M2550 Pain in unspecified joint: Secondary | ICD-10-CM | POA: Insufficient documentation

## 2017-06-06 ENCOUNTER — Telehealth: Payer: Self-pay | Admitting: *Deleted

## 2017-06-06 NOTE — Telephone Encounter (Signed)
Called patient to discuss side effects from medications that she reported earlier today.  Per Gaspar Bidding, NP, patient should continue medication for the one month duration and at the end of that course we will discuss changing to exemestane if she is still having side effects.

## 2017-06-06 NOTE — Telephone Encounter (Signed)
-----   Message from Secundino Ginger sent at 06/06/2017  1:49 PM EDT ----- Contact: [phone] Side effects have not changed since hormone medication was changed. She is still having pain in her knees, down legs, right shoulder pain. She said that you told her to stay in touch about this. She has about 1/2 month pills. Wants to know what your view is on this?

## 2017-06-08 ENCOUNTER — Telehealth: Payer: Self-pay | Admitting: *Deleted

## 2017-06-08 NOTE — Telephone Encounter (Signed)
I called and spoke with patient who states that she did not request a refill and that she does NOT want it refilled. She states she needs to see doctor first to discuss that she is still hurting and this is the second pill she has tried. Her follow up appointment is not until May 22.

## 2017-06-08 NOTE — Telephone Encounter (Signed)
This is her second AI. We wanted her to stay on it at least a month. I will wait for Dr. Kem Parkinson input, but we could potentially try her on exemestane. After she spoke with Rodena Piety, and now triage, I am fairly certain that she has self discontinued her anastrozole. I don't think that she will need an appointment before her scheduled RTC in May, but again, I want to get input from Dr. Mike Gip.

## 2017-06-10 NOTE — Telephone Encounter (Signed)
  Lets discuss tomorrow.  M

## 2017-06-12 NOTE — Telephone Encounter (Signed)
Can we call here and find out is she has stopped her hormonal therapy? She was taken off of Femara due to joint complaints. Now she is not tolerating Arimidex. Find out when the last date she took any of her hormonal medication. Advise her to let her symptoms (joint pains) resolve, and we will try the Aromasin (exemestane) if she is willing. If she would prefer, we would offer to bring her back in to discuss everything with Dr. Mike Gip as well.

## 2017-06-14 ENCOUNTER — Telehealth: Payer: Self-pay | Admitting: *Deleted

## 2017-06-14 NOTE — Telephone Encounter (Signed)
Called patient to ask when she last took Arimidex and she stated today.  She saw her orthopedic MD today and had a cortisone injection in her knee.  She would like to come off the Arimidex and let her symptoms resolve and call us back for an appointment to come back to discuss exemestane.  I advised her to stop the Arimidex and as soon as her symptoms resolve she should call us back to set up an appointment for MD appointment.

## 2017-06-27 ENCOUNTER — Encounter: Payer: Self-pay | Admitting: Hematology and Oncology

## 2017-06-27 ENCOUNTER — Inpatient Hospital Stay: Payer: Medicare HMO | Attending: Hematology and Oncology | Admitting: Hematology and Oncology

## 2017-06-27 VITALS — BP 146/71 | HR 93 | Temp 98.3°F | Resp 20 | Wt 209.7 lb

## 2017-06-27 DIAGNOSIS — K219 Gastro-esophageal reflux disease without esophagitis: Secondary | ICD-10-CM | POA: Insufficient documentation

## 2017-06-27 DIAGNOSIS — C50411 Malignant neoplasm of upper-outer quadrant of right female breast: Secondary | ICD-10-CM | POA: Insufficient documentation

## 2017-06-27 DIAGNOSIS — Z86010 Personal history of colonic polyps: Secondary | ICD-10-CM | POA: Diagnosis not present

## 2017-06-27 DIAGNOSIS — M2550 Pain in unspecified joint: Secondary | ICD-10-CM

## 2017-06-27 DIAGNOSIS — I10 Essential (primary) hypertension: Secondary | ICD-10-CM | POA: Diagnosis not present

## 2017-06-27 DIAGNOSIS — Z9011 Acquired absence of right breast and nipple: Secondary | ICD-10-CM | POA: Diagnosis not present

## 2017-06-27 DIAGNOSIS — Z803 Family history of malignant neoplasm of breast: Secondary | ICD-10-CM | POA: Diagnosis not present

## 2017-06-27 DIAGNOSIS — R5383 Other fatigue: Secondary | ICD-10-CM | POA: Insufficient documentation

## 2017-06-27 DIAGNOSIS — Z79899 Other long term (current) drug therapy: Secondary | ICD-10-CM | POA: Diagnosis not present

## 2017-06-27 DIAGNOSIS — Z79811 Long term (current) use of aromatase inhibitors: Secondary | ICD-10-CM | POA: Insufficient documentation

## 2017-06-27 DIAGNOSIS — Z87891 Personal history of nicotine dependence: Secondary | ICD-10-CM | POA: Diagnosis not present

## 2017-06-27 DIAGNOSIS — Z85828 Personal history of other malignant neoplasm of skin: Secondary | ICD-10-CM | POA: Insufficient documentation

## 2017-06-27 DIAGNOSIS — E7800 Pure hypercholesterolemia, unspecified: Secondary | ICD-10-CM | POA: Insufficient documentation

## 2017-06-27 DIAGNOSIS — Z170 Estrogen receptor positive status [ER+]: Secondary | ICD-10-CM | POA: Diagnosis not present

## 2017-06-27 DIAGNOSIS — F418 Other specified anxiety disorders: Secondary | ICD-10-CM | POA: Insufficient documentation

## 2017-06-27 DIAGNOSIS — M1990 Unspecified osteoarthritis, unspecified site: Secondary | ICD-10-CM | POA: Insufficient documentation

## 2017-06-27 DIAGNOSIS — Z923 Personal history of irradiation: Secondary | ICD-10-CM | POA: Insufficient documentation

## 2017-06-27 MED ORDER — EXEMESTANE 25 MG PO TABS: 25.0000 mg | ORAL_TABLET | Freq: Every day | ORAL | 0 refills | Status: DC

## 2017-06-27 NOTE — Progress Notes (Signed)
Patient here today to discuss changing her oral chemotherapy.  She has been unable to tolerate Letrozoe and Anastrozole.  She complaints that they both caused severe joint pain with anastrozole being the worse one.  Symptoms have all gone away since she stopped.  States she has more stamina.

## 2017-06-27 NOTE — Progress Notes (Signed)
[facility] day:  06/27/2017  Chief Complaint: Allison Hall is a 77 y.o. female with right breast cancer who is seen for reassessment after discontinuation of Arimidex.   HPI:  The patient was last seen in the medical oncology clinic on 05/16/2017.  At that time, Femara was discontinued secondary to fatigue, changes in hair, and polyarthralgias.  A prescription for Armidex was provided.  Patient stopped Femara on 05/16/2017 after approximately 4 months of therapy. Patient was off medication for about 4- 5 days prior to switching to the prescribed Arimidex. Patient took the Arimidex for a total of 19 days. She discontinued Arimidex on 06/14/2017 due to significant joint pains.  Patient has been doing well since she stopped her hormonal therapy. Her joint pain has totally resolved and she "has the desire" to do more.   She was seen by orthopedics and received a steroid injection in the knee on 06/14/2017.  During the interim, patient is doing well. She has no acute concerns today. Patient notes that her fatigue is "mostly gone". Patient is cleaning and cooking more because she has felt "much more normal". Patient does not verbalize any concerns with regarding to her breasts. Patient's only complaint today is that her hair is still straight. Patient has no B symptoms. She has had no interval infections.   Patient is eating well. Her weight is up 2 pounds. Patient denies pain in the clinic today.    Past Medical History:  Diagnosis Date  . Anxiety   . Cancer ([place])    skin ca  . Colon polyp   . Depression   . GERD (gastroesophageal reflux disease)   . Hypercholesteremia   . Hypertension   . Osteoarthritis     Past Surgical History:  Procedure Laterality Date  . ABDOMINAL HYSTERECTOMY    . APPENDECTOMY    . JOINT REPLACEMENT    . NOSE SURGERY    . PARTIAL MASTECTOMY WITH NEEDLE LOCALIZATION Right 09/29/2016   Procedure: PARTIAL  MASTECTOMY WITH NEEDLE LOCALIZATION;  Surgeon: [name], MD;  Location: [facility] ORS;  Service: General;  Laterality: Right;  . REPLACEMENT TOTAL KNEE Right 2008  . SENTINEL NODE BIOPSY Right 09/29/2016   Procedure: SENTINEL NODE BIOPSY;  Surgeon: [name], MD;  Location: [facility] ORS;  Service: General;  Laterality: Right;    Family History  Problem Relation Age of Onset  . Hypertension Mother   . Depression Mother   . Heart failure Father   . Obesity Brother   . Heart disease Brother   . Stroke Brother   . Depression Brother   . Breast cancer Cousin        2 mat and 1 pat cousins    Social History:  reports that she quit smoking about 52 years ago. She has a 10.00 pack-year smoking history. She has never used smokeless tobacco. She reports that she drinks about 4.2 oz of alcohol per week. She reports that she does not use drugs.  She previously smoked 1/2 pack/day x 10 years.  She stopped smoking on 10/11/1978.  She drinks 6 glasses of wine/week.  She has 3 children (2 sons and 1 daughter).  She is a retired Clinical cytogeneticist for [employer].  She likes to do crossword puzzles.  Her daughter's name is [name].  Her husband is in an assisted living facility.  She lives in [place].  She is alone today.  Allergies:  Allergies  Allergen Reactions  .  Sulfamethoxazole Other (See Comments)    Anxiety and imsomnia  . Statins Other (See Comments)    Muscle pain   . Trazodone Other (See Comments)    Pt states she felt "unsteady"   . Ciprofloxacin Anxiety  . Sulfa Antibiotics Anxiety    Current Medications: Current Outpatient Medications  Medication Sig Dispense Refill  . acetaminophen (TYLENOL) 500 MG tablet Take 500 mg by mouth 2 (two) times daily.    Marland Kitchen amLODipine (NORVASC) 5 MG tablet Take 5 mg by mouth daily.     Marland Kitchen buPROPion (WELLBUTRIN SR) 100 MG 12 hr tablet Take 1 tablet (100 mg total) by mouth daily. (Patient taking differently: Take 200 mg by mouth daily. ) 30 tablet 1  .  busPIRone (BUSPAR) 10 MG tablet Take 10 mg by mouth 3 (three) times daily.    . calcium carbonate 1250 MG capsule Take 1,250 mg by mouth 2 (two) times daily with a meal.    . denosumab (PROLIA) 60 MG/ML SOLN injection Inject 60 mg into the skin every 6 (six) months. Administer in upper arm, thigh, or abdomen    . lisinopril (PRINIVIL,ZESTRIL) 20 MG tablet Take 30 mg by mouth daily. Takes 1.5 tablet    . loperamide (IMODIUM) 2 MG capsule Take 2 mg by mouth as needed for diarrhea or loose stools.     . Magnesium 250 MG TABS Take 2 tablets by mouth daily.    Marland Kitchen omeprazole (PRILOSEC) 40 MG capsule Take 20 mg by mouth.     . rosuvastatin (CRESTOR) 5 MG tablet Take 5 mg by mouth daily.    . sertraline (ZOLOFT) 100 MG tablet Take 1.5 tablets (150 mg total) by mouth daily. (Patient taking differently: Take 100-150 mg by mouth See admin instructions. Takes 100 mg daily except on Monday, Wednesday and Fridays and takes 150 mg (1.5 tablet)) 45 tablet 1  . Turmeric Curcumin 500 MG CAPS Take 1,500 mg by mouth daily.     Marland Kitchen UNABLE TO FIND Take 1,000 mg by mouth 3 (three) times daily. Hemp Oil capsules 1000 mg each Also contains Omega 3 and Omega 6 Fatty Acids    . letrozole (FEMARA) 2.5 MG tablet Take 1 tablet (2.5 mg total) by mouth daily. (Patient not taking: Reported on 06/27/2017) 90 tablet 3   No current facility-administered medications for this visit.     Review of Systems:  GENERAL:  Feels "so much better".  Fatigue is "mostly gone".  No fevers or sweats.  Weight up 2 pounds.  PERFORMANCE STATUS (ECOG):  0 HEENT:  No visual changes, runny nose, sore throat, mouth sores or tenderness. Lungs: No shortness of breath or cough.  No hemoptysis. Cardiac:  No chest pain, palpitations, orthopnea, or PND. GI:  No nausea, vomiting, diarrhea, constipation, melena or hematochezia. GU:  No urgency, frequency, dysuria, or hematuria. Musculoskeletal:   Pain in her RIGHT shoulder, clavicle area; improved.  No back  pain.  "Left knee bone on bone"; improved.  No muscle tenderness. Extremities:  No pain or swelling. Skin: No rashes or skin changes. Neuro:  Poor balance.  No headache, numbness or weakness, or coordination issues. Endocrine:  No diabetes or thyroid issues.  Psych:  Stress secondary to husband.  No mood changes, depression or anxiety. Pain:  No focal pain. Review of systems:  All other systems reviewed and found to be negative.  Physical Exam: Blood pressure (!) 146/71, pulse 93, temperature 98.3 F (36.8 C), temperature source Tympanic, resp. rate 20, weight  209 lb 10.5 oz (95.1 kg). GENERAL:  Well developed, well nourished, woman sitting comfortably in the exam room in no acute distress. MENTAL STATUS:  Alert and oriented to person, place and time. HEAD:  Short black styled hair.  Normocephalic, atraumatic, face symmetric, no Cushingoid features. EYES:  Glasses.  Green eyes.  No conjunctivitis or scleral icterus. NEUROLOGICAL: Unremarkable. PSYCH:  Appropriate.   No visits with results within 3 Day(s) from this visit.  Latest known visit with results is:  Hospital Outpatient Visit on 08/24/2016  Component Date Value Ref Range Status  . SURGICAL PATHOLOGY 08/24/2016    Final-Edited                   Value:Surgical Pathology THIS IS AN ADDENDUM REPORT CASE: [id number] PATIENT: [facility] Surgical Pathology Report Addendum  Reason for Addendum #1:  Immunohistochemistry results  SPECIMEN SUBMITTED: A. Breast, right, 9:30 B. Breast, right, 10:00  CLINICAL HISTORY: Masses  PRE-OPERATIVE DIAGNOSIS: IMC, possibly lobular  POST-OPERATIVE DIAGNOSIS: None provided.     DIAGNOSIS: A. BREAST, RIGHT 9:30; ULTRASOUND GUIDED BIOPSY: - INVASIVE MAMMARY CARCINOMA, DIFFUSELY INFILTRATIVE WITH LOBULAR-LIKE FEATURES.  Size of invasive carcinoma: 6 mm in this sample Histologic grade of invasive carcinoma: 2      Glandular/tubular differentiation score: 3       Nuclear pleomorphism score: 2      Mitotic rate score: 1      Total score: 6  Ductal carcinoma in situ: Not identified Lymphovascular invasion: Not identified  B. Breast, right 10:00; ULTRASOUND GUIDED BIOPSY: - INVASIVE MAMMARY CARCINOMA, WITH LOBULAR FEATURES.  Size                          of invasive carcinoma: 8 mm in this sample Histologic grade of invasive carcinoma: 2      Glandular/tubular differentiation score: 3      Nuclear pleomorphism score: 2      Mitotic rate score: 1      Total score: 6  Ductal carcinoma in situ: Not identified Lymphovascular invasion: Not identified  Comment: The definitive grade will be assigned on the excisional specimen. The carcinoma is the two biopsy specimens have identical morphology. ER/PR/HER2: Immunohistochemistry will be performed on block A1, with reflex to [place] for HER2 2+. The results will be reported in an addendum. These findings were communicated to [place] in Dr. Janace Hoard office on 08/25/2016. Read back procedure was performed.     GROSS DESCRIPTION:  A. The specimen is received in a formalin-filled container labeled with the patient's name and right breast 9:30, 7 cm from nipple.  Core pieces: multiple, aggregate 1.6 x 0.4 x 0.1 cm Comments: red yellow lobulated fibrofatty tissue, ma                         rked green  Entirely submitted in cassette(s): 1  Time/Date in fixative: placed in formalin at 1:40 PM on 08/24/2016 cold ischemic time of less than 2 minutes Total fixation time: 6.7 Hours  B. The specimen is received in a formalin-filled container labeled with the patient's name and right breast #2 10:00, 5 cm from nipple.  Core pieces: multiple, aggregate 1.7 x 0.5 x 0.1 cm Comments: yellow to pink lobulated fibrofatty, marked black  Entirely submitted in cassette(s): 1  Time/Date in fixative: placed in formalin at 1:50 PM on 08/24/2016 cold ischemic time of less than 2 minutes Total  fixation time: 6.6  hours    Final Diagnosis performed by Quay Burow, MD.  Electronically signed 08/25/2016 2:54:14PM   The electronic signature indicates that the named Attending Pathologist has evaluated the specimen  Technical component performed at [facility], [address], [place], [place] [address] Lab: [phone] Dir: Darrick Penna. Evette Doffing, MD  Professional component perf                         ormed at [facility], [facility], [place], [place], [place] [address] Lab: [phone] Dir: Dellia Nims. Rubinas, MD   Breast Biomarker Reporting Template  BREAST BIOMARKER TESTS Estrogen Receptor (ER) Status: Positive, greater than 90%      Average intensity of staining: Strong Progesterone Receptor (PgR) Status: Positive, greater than 90%      Average intensity of staining: Strong HER2 (by immunohistochemistry): 2+ equivocal Percentage of cells with uniform intense complete membrane staining: 5% HER2 FISH pending.  METHODS Cold Ischemia and Fixation Times: Meet requirements specified in latest version of the ASCO/CAP guidelines Testing Performed on Block Number(s): A1 Fixative: Formalin Estrogen Receptor:  FDA cleared ([place])                    Primary Antibody:  SP1 Progesterone Receptor: FDA cleared ([place])                   Primary Antibody: 1E2 HER2 (by immunohistochemistry): FDA approved ([place])                             Prima                         ry Antibody: HercepTest  Immunohistochemistry controls worked appropriately.  Immunohistochemistry controls worked appropriately. Slides were prepared by [facility], [place], [place], and interpreted by Quay Burow, MD.     Addendum #1 performed by Quay Burow, MD.  Electronically signed 08/30/2016 4:33:51PM    Technical component performed at [facility], [address] [place], [place] [address] Lab: [phone] Dir: Darrick Penna. Evette Doffing, MD  Professional  component performed at [facility], [facility], [place], [place], [place] [address] Lab: [phone] Dir: Dellia Nims. Rubinas, MD      Assessment:  Allison Hall is a 77 y.o. female with multi-focal right breast cancer s/p partial mastectomy with sentinel lymph node biopsy on 09/29/2016.  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  Three sentinel lymph nodes were negative.  Tumor was ER + (> 90%), PR + (> 90%) and Her2/neu 2+ (negative by FISH).  Pathologic stage was T1c(m) N0(sn).  CA27.29 was 13.4 on 09/06/2016.  MammaPrint revealed a 29% risk of recurrence (high).  She was seen for second opinion at [facility] on 10/25/2016.  Chemotherapy was not recommended.  Bilateral screening mammogram on 07/24/2016 revealed a possible area of distortion in the right breast. Diagnostic mammogram on 08/10/2016 revealed 2 suspicious masses in the upper-outer quadrant of the right breast in a broad area of confluent distortion in the upper-outer quadrant of the right breast.  There are at least 3 areas of confluent distortion spanning approximately 6.5 cm of tissue.  Ultrasound on 07/31/2016 revealed  an irregular hypoechoic 1.2 x 1.1 x 1.2 cm at the 9:30 position 7 cm from the nipple.  In addition, there  was an irregular 1.9 x 0.8 x 1.2 cm mass at the 10 o'clock position 5 cm from the nipple.  There were multiple small normal-appearing lymph nodes.   Breast MRI on 09/16/2016 revealed an irregular 1.6 x 1.2 x 1.6 cm enhancing mass adjacent to the clip artifact within the upper, outer right breast, middle depth, corresponding to the mass seen at 10 o'clock. There was an additional irregular enhancing mass adjacent to the more posterior clip artifact measuring 2.6 x 1.4 x 1.6 cm, corresponding  to the mass seen at 9:30 position. Together, these 2 masses span 6.3 cm of breast tissue from  anterior to posterior. There was no definite additional suspicious area of enhancement within the right breast to correspond to the third area of distortion identified on diagnostic mammogram dated 08/10/2016.  Left breast revealed no suspicious mass or enhancement.  She received radiation from 11/15/2016 - 01/08/2017.  She was Femara (01/17/2017 - 05/16/2017) secondary to arthralgias.  She discontinued Arimidex after 19 days (last 06/14/2017) secondary to fatigue and polyarthralgias.  CA27.29 has been followed:  13.4 on 09/06/2016 and 13.0 on 05/16/2017.  Bone density study on 09/30/2015 revealed osteopenia with a T-score of -2.1 in the right femoral neck.  She began Prolia on 02/14/2017.  Symptomatically, patient is fatigued. She expresses concerns with her hair being straight on Femara. She has joint pains. She denies breast concerns. Exam reveals post-operative and post radiation changes.  Plan: 1.  Labs today: CMP.   2.  Discontinue Arimidex due to fatigue, changes in hair, and polyarthralgias. 3.  Start exemestane 25 mg daily (Disp #30). Patient to call to discuss tolerance.  4.  Continue calcium and vitamin D. 5.  Bilateral mammogram in 07/2017. Study to be ordered by surgery per patient report.  6.  RTC on 08/15/2017 for labs (BMP) and Prolia. 7.  RTC in 1 month for MD assessment and labs (CBC with diff, CMP, CA27.29).   Honor Loh, NP  06/27/2017, 4:39 PM   I saw and evaluated the patient, participating in the key portions of the service and reviewing pertinent diagnostic studies and records.  I reviewed the nurse practitioner's note and agree with the findings and the plan.  The assessment and plan were discussed with the patient.  A few questions were asked by the patient and answered.   Nolon Stalls, MD 06/27/2017, 4:39 PM

## 2017-07-05 ENCOUNTER — Encounter: Payer: Self-pay | Admitting: Radiation Oncology

## 2017-07-05 ENCOUNTER — Ambulatory Visit
Admission: RE | Admit: 2017-07-05 | Discharge: 2017-07-05 | Disposition: A | Discharge status: Home / Self Care | Payer: Medicare HMO | Source: Ambulatory Visit | Attending: Radiation Oncology | Admitting: Radiation Oncology

## 2017-07-05 ENCOUNTER — Other Ambulatory Visit: Payer: Self-pay

## 2017-07-05 VITALS — BP 142/84 | HR 86 | Temp 98.3°F | Resp 18 | Wt 208.2 lb

## 2017-07-05 DIAGNOSIS — C50411 Malignant neoplasm of upper-outer quadrant of right female breast: Secondary | ICD-10-CM

## 2017-07-05 DIAGNOSIS — Z923 Personal history of irradiation: Secondary | ICD-10-CM | POA: Insufficient documentation

## 2017-07-05 DIAGNOSIS — Z170 Estrogen receptor positive status [ER+]: Secondary | ICD-10-CM

## 2017-07-05 DIAGNOSIS — Z853 Personal history of malignant neoplasm of breast: Secondary | ICD-10-CM | POA: Insufficient documentation

## 2017-07-05 DIAGNOSIS — Z08 Encounter for follow-up examination after completed treatment for malignant neoplasm: Secondary | ICD-10-CM | POA: Diagnosis present

## 2017-07-05 NOTE — Progress Notes (Signed)
Radiation Oncology Follow up Note  Name: Allison Hall   Date:   07/05/2017 MRN:  [id number] DOB: Nov 18, 1940    This 77 y.o. female presents to the clinic today for 5 month follow-up status post whole breast radiation to her right breast for stage I ER/PR positive invasive mammary carcinoma.  REFERRING PROVIDER: Clarisse Gouge, MD  HPI: patient is a 77 year old female now 5 months out having completed whole breast radiation to her right breast for stage I ER/PR positive HER-2/neu negative invasive mammary carcinoma. Seen today in routine follow-up she is doing well she has a mammogram scheduled for next month. She has been on aromatase inhibitor although she is now on her third one based on their side effect profile. She specifically denies breast tenderness cough or bone pain..  COMPLICATIONS OF TREATMENT: none  FOLLOW UP COMPLIANCE: keeps appointments   PHYSICAL EXAM:  BP (!) 142/84   Pulse 86   Temp 98.3 F (36.8 C)   Resp 18   Wt 208 lb 3.6 oz (94.4 kg)   BMI 34.65 kg/m  Lungs are clear to A&P cardiac examination essentially unremarkable with regular rate and rhythm. No dominant mass or nodularity is noted in either breast in 2 positions examined. Incision is well-healed. No axillary or supraclavicular adenopathy is appreciated. Cosmetic result is excellent.  RADIOLOGY RESULTS: no current films for review  PLAN: at the present time she continues to do well with no evidence of disease.she is currently on Aromasin tolerating that well without significant side effect. She is scheduled for follow-up mammograms in May. I have asked to see her back in 6 months and will start once your follow-up appointments. Patient is to call anytime with any concerns.  I would like to take this opportunity to thank you for allowing me to participate in the care of your patient.Noreene Filbert, MD

## 2017-07-20 ENCOUNTER — Other Ambulatory Visit: Payer: Self-pay | Admitting: Urgent Care

## 2017-07-20 NOTE — Telephone Encounter (Signed)
Call placed to patient to discuss tolerance of prescribed exemestane. Patient states, "it is not bothering me. I am not hurting". Patient verbalizes desires to proceed with exemestane therapy.   Patient wanting to make mention of a "soreness" around her surgical incision. She states, "It is not bothering me. I just wanted you to know". Patient denies intense pain, skin changes, and discharge. Advised to monitor and return call for worsening symptoms.   Refill prescription sent in for: 1.  Exemestane 25 mg daily (Disp #90 r3)  Patient encouraged to call the clinic with any questions or concerns. She verbalized understanding and appreciation of communication from her oncology care team.   Honor Loh, MSN, APRN, FNP-C, CEN Oncology/Hematology Nurse Practitioner  [facility] 07/20/17, 4:30 PM

## 2017-08-13 ENCOUNTER — Other Ambulatory Visit: Payer: Medicare HMO

## 2017-08-15 ENCOUNTER — Ambulatory Visit: Payer: Medicare HMO

## 2017-08-15 ENCOUNTER — Ambulatory Visit: Payer: Medicare HMO | Admitting: Hematology and Oncology

## 2017-08-15 ENCOUNTER — Other Ambulatory Visit: Payer: Medicare HMO

## 2017-08-19 IMAGING — CR DG FOOT COMPLETE 3+V*R*
3 series · 3 of 3 positions shown · non-contrast
Comparison: None.

CLINICAL DATA: PT with lateral right foot and ankle pain after fall
this morning. No hx of previous injury or trauma.

EXAM:
RIGHT FOOT COMPLETE - 3+ VIEW

[foot ap]
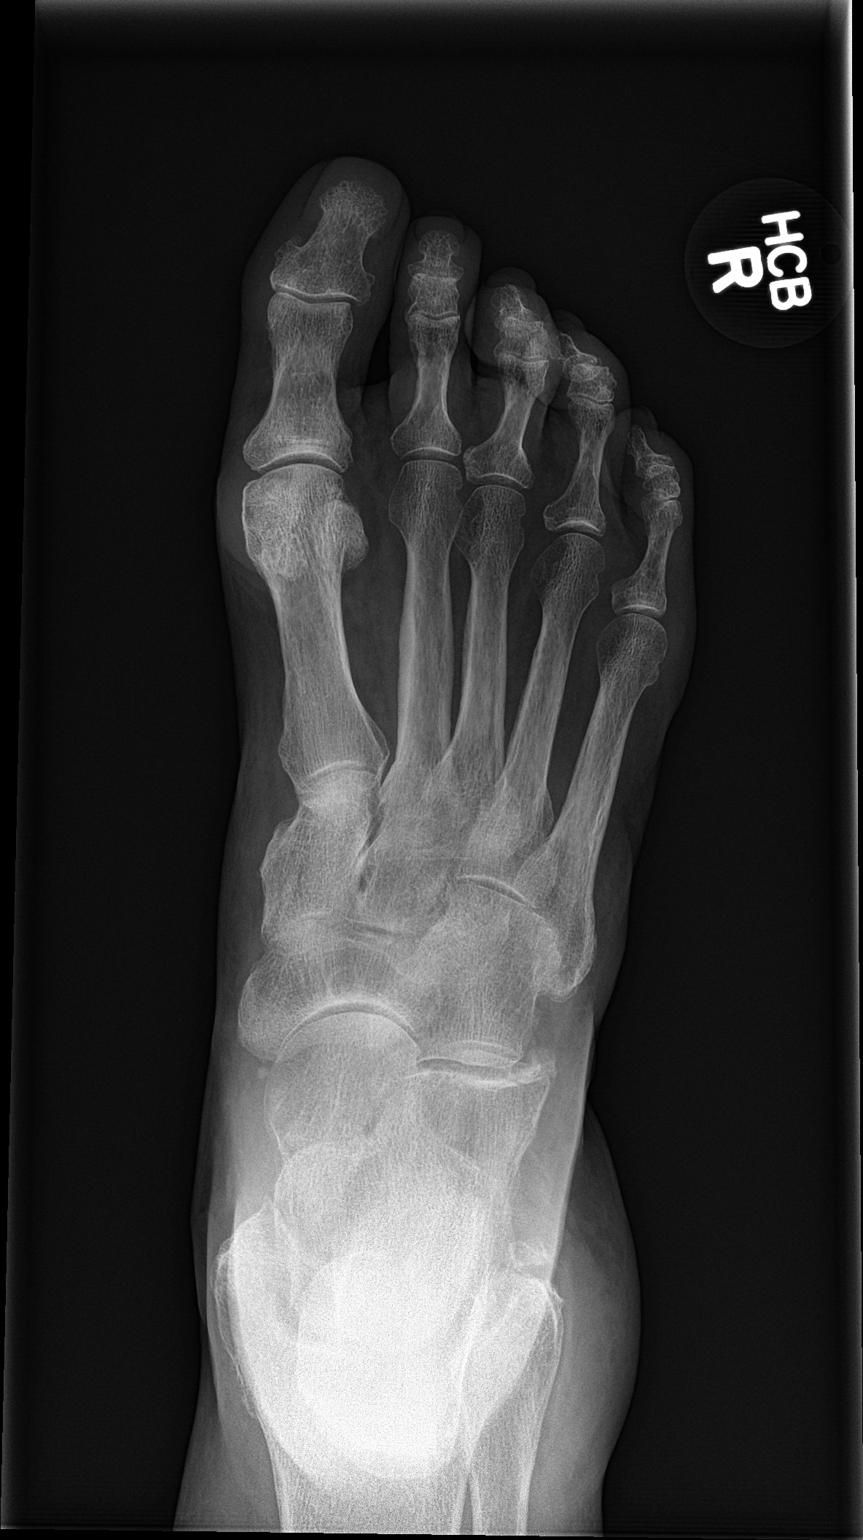

[foot obl]
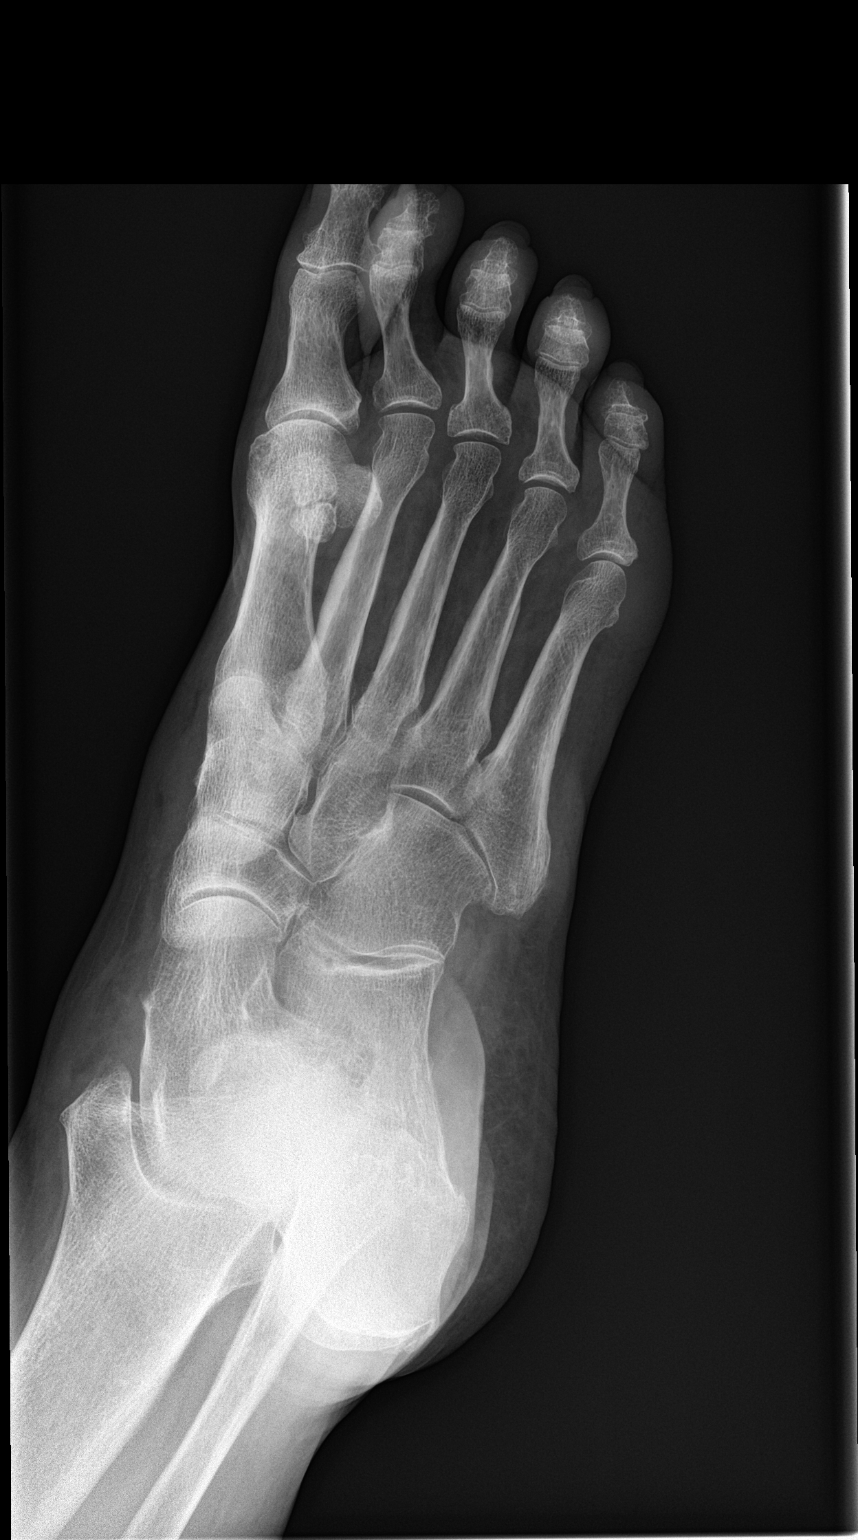

[foot lat]
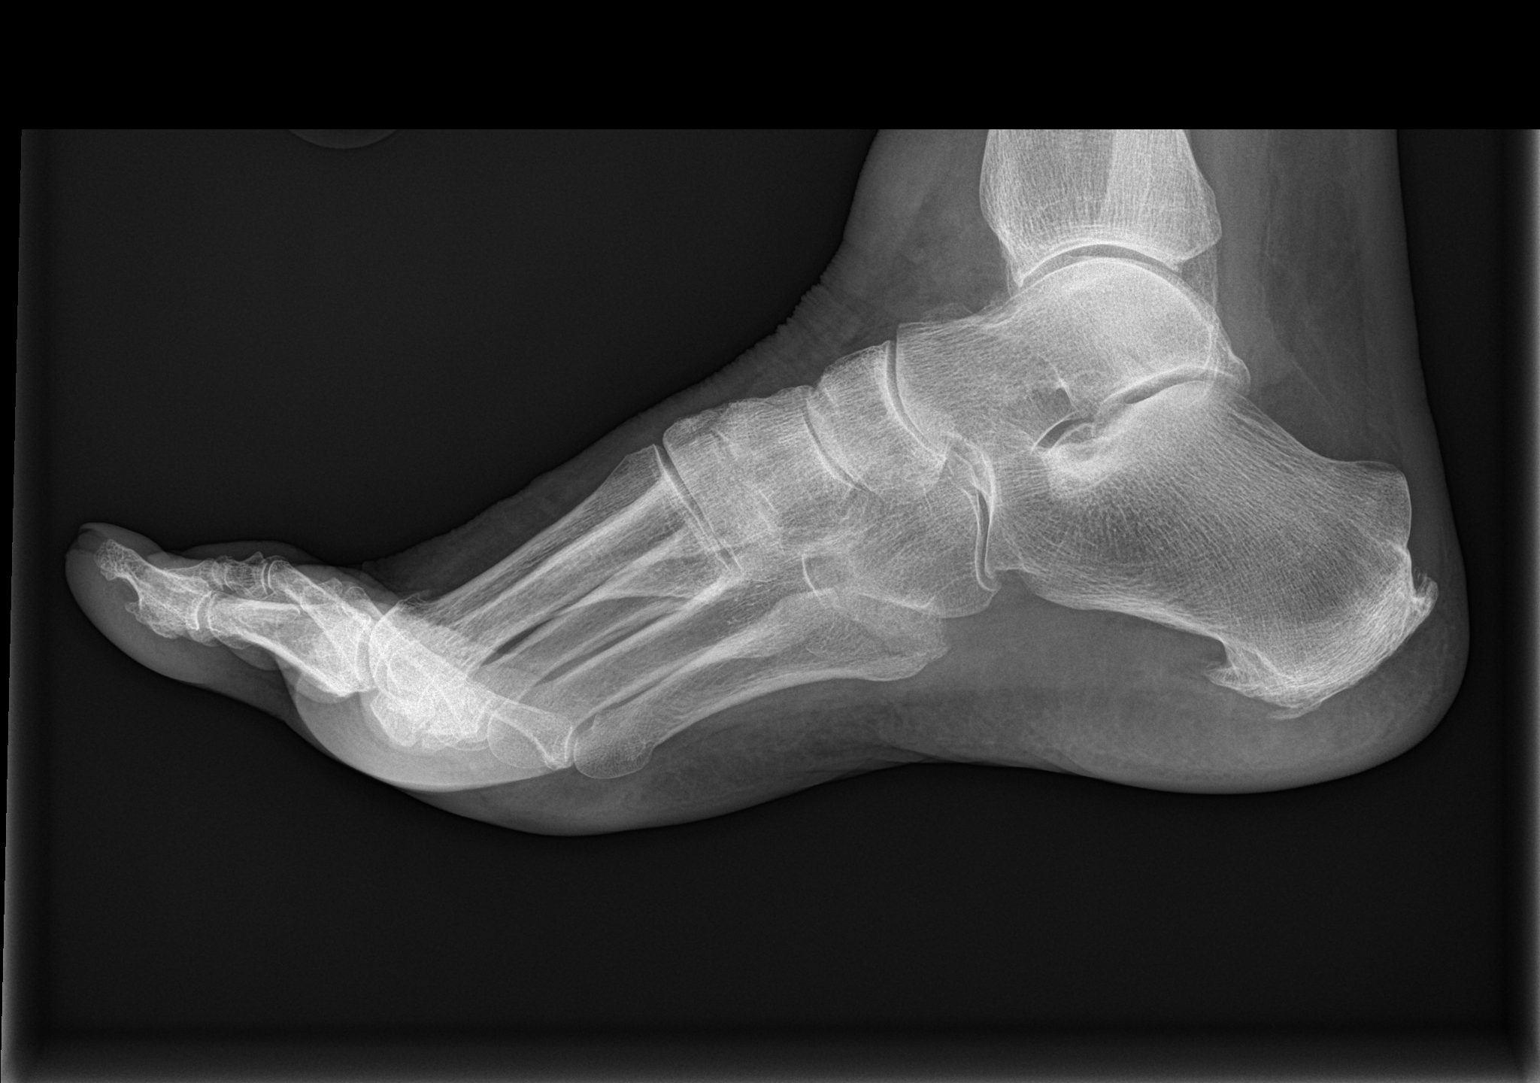

[3 of 3 positions shown; findings below may reference images not displayed]

FINDINGS: Osteopenia limits characterization of osseous detail. There is a
questionable nondisplaced fracture at the base of the fifth
metatarsal bone, only seen on the lateral view. Remainder the
osseous structures appear intact and normally aligned.
IMPRESSION: Probable nondisplaced fracture at the base of the fifth metatarsal
bone.

## 2017-08-19 IMAGING — CR DG ANKLE COMPLETE 3+V*R*
3 series · 3 of 3 positions shown · non-contrast
Comparison: Right foot 10/29/2016

CLINICAL DATA: Fall this morning with lateral foot and ankle pain.

EXAM:
RIGHT ANKLE - COMPLETE 3+ VIEW

[ankle ap]
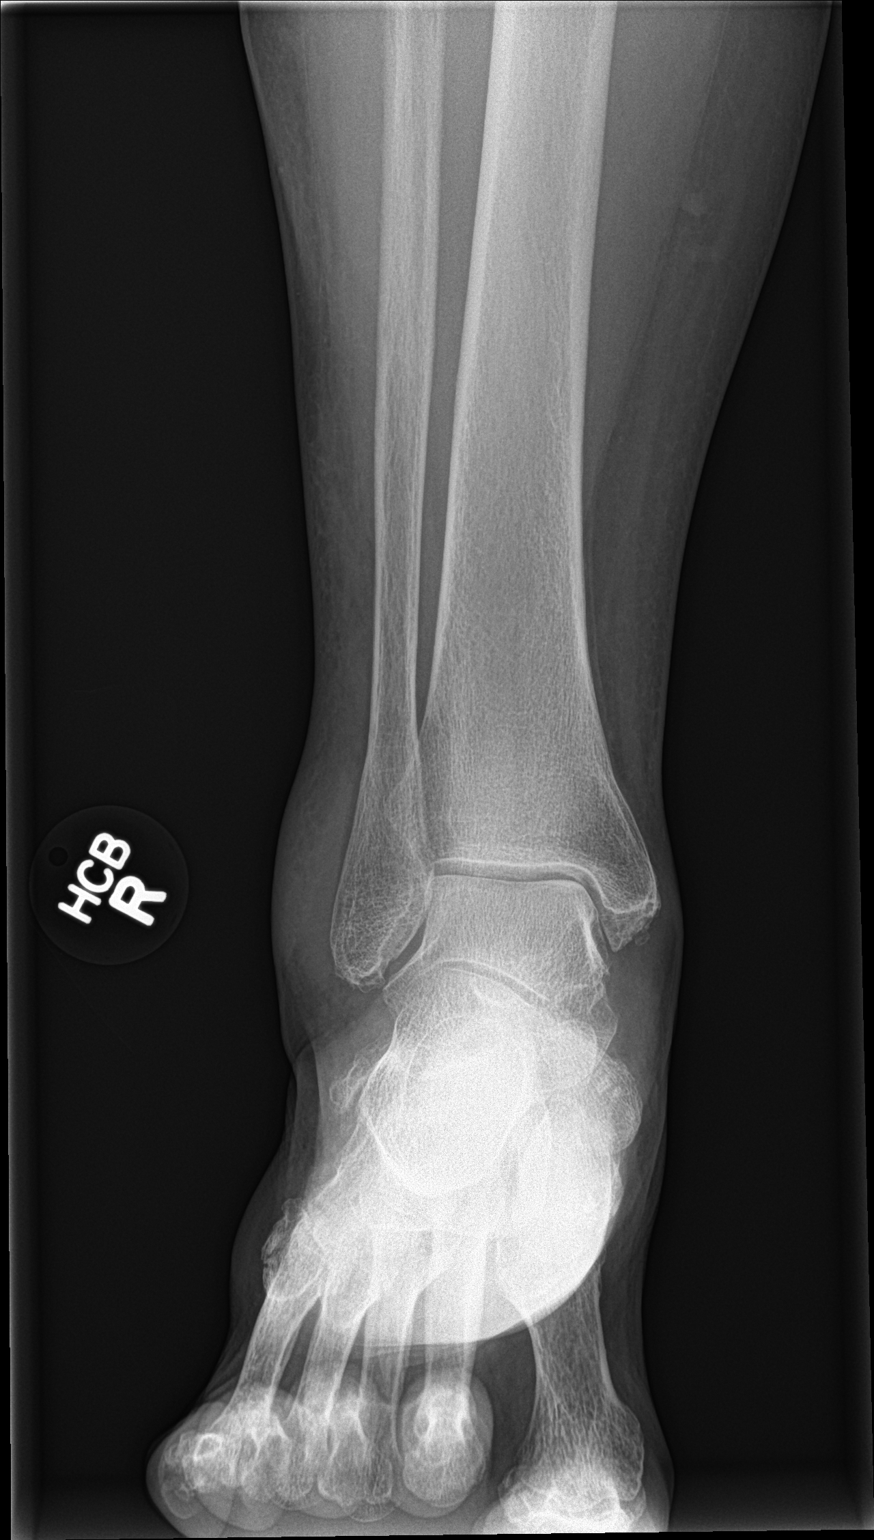

[ankle obl]
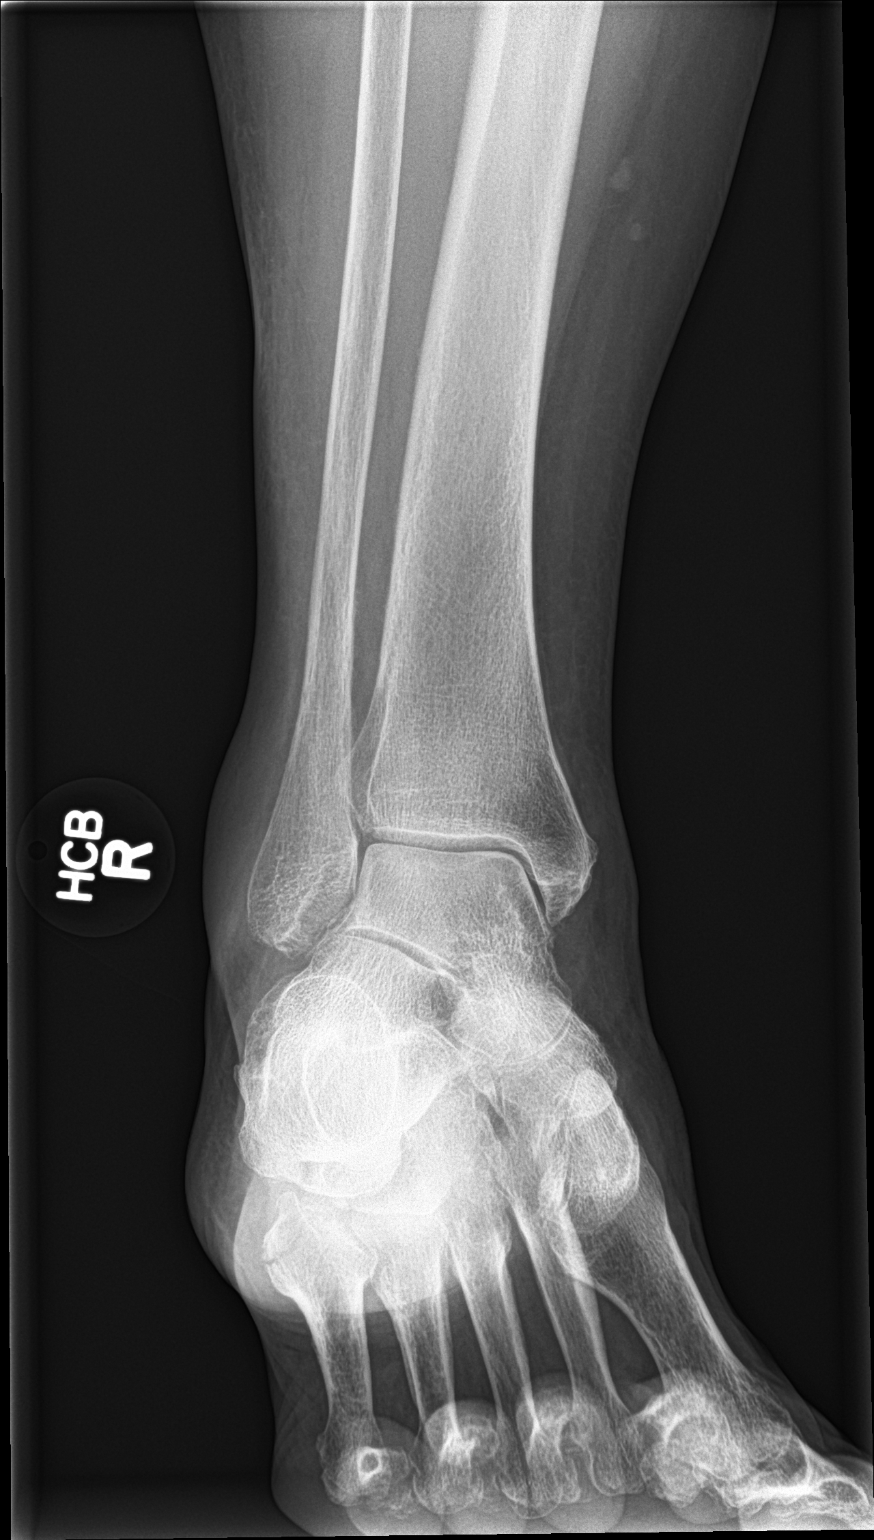

[ankle lat]
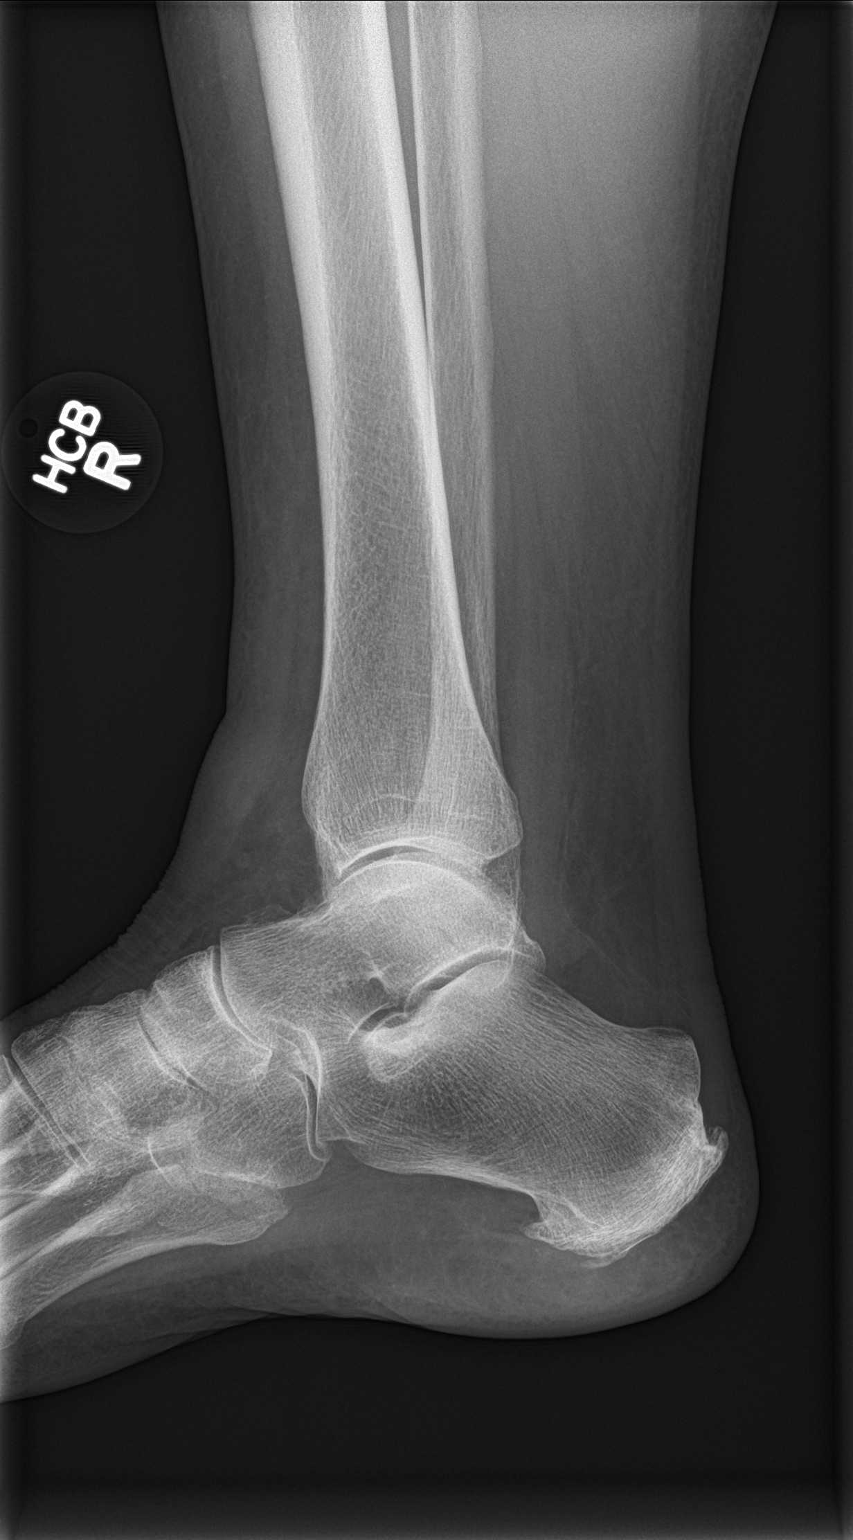

[3 of 3 positions shown; findings below may reference images not displayed]

FINDINGS: Lateral soft tissue swelling. Nondisplaced fracture at the base of
the fifth metatarsal bone. The ankle is located. Prominent calcaneal
spurs.
IMPRESSION: Nondisplaced fracture at the base of the fifth metatarsal bone.

Lateral soft tissue swelling in the right ankle.  No ankle fracture.

## 2017-08-28 ENCOUNTER — Ambulatory Visit
Admission: RE | Admit: 2017-08-28 | Discharge: 2017-08-28 | Disposition: A | Discharge status: Home / Self Care | Payer: Medicare HMO | Source: Ambulatory Visit | Attending: General Surgery | Admitting: General Surgery

## 2017-08-28 DIAGNOSIS — Z853 Personal history of malignant neoplasm of breast: Secondary | ICD-10-CM

## 2017-08-28 HISTORY — DX: Malignant neoplasm of unspecified site of unspecified female breast: C50.919

## 2017-08-28 HISTORY — DX: Personal history of irradiation: Z92.3

## 2017-08-29 ENCOUNTER — Other Ambulatory Visit: Payer: Self-pay | Admitting: Hematology and Oncology

## 2017-08-29 ENCOUNTER — Encounter: Payer: Self-pay | Admitting: Hematology and Oncology

## 2017-08-29 ENCOUNTER — Inpatient Hospital Stay: Payer: Medicare HMO | Attending: Hematology and Oncology

## 2017-08-29 ENCOUNTER — Inpatient Hospital Stay (HOSPITAL_BASED_OUTPATIENT_CLINIC_OR_DEPARTMENT_OTHER): Payer: Medicare HMO | Admitting: Hematology and Oncology

## 2017-08-29 ENCOUNTER — Inpatient Hospital Stay: Payer: Medicare HMO

## 2017-08-29 VITALS — BP 148/76 | HR 97 | Temp 98.7°F | Resp 18 | Wt 209.4 lb

## 2017-08-29 DIAGNOSIS — C50411 Malignant neoplasm of upper-outer quadrant of right female breast: Secondary | ICD-10-CM

## 2017-08-29 DIAGNOSIS — Z170 Estrogen receptor positive status [ER+]: Secondary | ICD-10-CM | POA: Diagnosis not present

## 2017-08-29 DIAGNOSIS — D649 Anemia, unspecified: Secondary | ICD-10-CM | POA: Diagnosis not present

## 2017-08-29 DIAGNOSIS — Z79811 Long term (current) use of aromatase inhibitors: Secondary | ICD-10-CM | POA: Diagnosis not present

## 2017-08-29 DIAGNOSIS — Z79899 Other long term (current) drug therapy: Secondary | ICD-10-CM | POA: Insufficient documentation

## 2017-08-29 DIAGNOSIS — M8580 Other specified disorders of bone density and structure, unspecified site: Secondary | ICD-10-CM

## 2017-08-29 DIAGNOSIS — M85851 Other specified disorders of bone density and structure, right thigh: Secondary | ICD-10-CM | POA: Insufficient documentation

## 2017-08-29 LAB — COMPREHENSIVE METABOLIC PANEL
ALT: 17 U/L (ref 14–54)
AST: 23 U/L (ref 15–41)
Albumin: 4.8 g/dL (ref 3.5–5.0)
Alkaline Phosphatase: 55 U/L (ref 38–126)
Anion gap: 12 (ref 5–15)
BUN: 19 mg/dL (ref 6–20)
CO2: 22 mmol/L (ref 22–32)
Calcium: 9.1 mg/dL (ref 8.9–10.3)
Chloride: 103 mmol/L (ref 101–111)
Creatinine, Ser: 1.12 mg/dL — ABNORMAL HIGH (ref 0.44–1.00)
GFR calc Af Amer: 53 mL/min — ABNORMAL LOW (ref >60)
GFR calc non Af Amer: 46 mL/min — ABNORMAL LOW (ref >60)
Glucose, Bld: 107 mg/dL — ABNORMAL HIGH (ref 65–99)
Potassium: 3.9 mmol/L (ref 3.5–5.1)
Sodium: 137 mmol/L (ref 135–145)
Total Bilirubin: 0.3 mg/dL (ref 0.3–1.2)
Total Protein: 7.5 g/dL (ref 6.5–8.1)

## 2017-08-29 LAB — CBC WITH DIFFERENTIAL/PLATELET
Basophils Absolute: 0.1 10*3/uL (ref 0–0.1)
Basophils Relative: 1 %
Eosinophils Absolute: 0.1 10*3/uL (ref 0–0.7)
Eosinophils Relative: 2 %
HCT: 32.4 % — ABNORMAL LOW (ref 35.0–47.0)
Hemoglobin: 10.7 g/dL — ABNORMAL LOW (ref 12.0–16.0)
Lymphocytes Relative: 20 %
Lymphs Abs: 1.2 10*3/uL (ref 1.0–3.6)
MCH: 27.8 pg (ref 26.0–34.0)
MCHC: 32.9 g/dL (ref 32.0–36.0)
MCV: 84.3 fL (ref 80.0–100.0)
Monocytes Absolute: 0.6 10*3/uL (ref 0.2–0.9)
Monocytes Relative: 10 %
Neutro Abs: 4 10*3/uL (ref 1.4–6.5)
Neutrophils Relative %: 67 %
Platelets: 252 10*3/uL (ref 150–440)
RBC: 3.84 MIL/uL (ref 3.80–5.20)
RDW: 15.6 % — ABNORMAL HIGH (ref 11.5–14.5)
WBC: 6 10*3/uL (ref 3.6–11.0)

## 2017-08-29 MED ORDER — DENOSUMAB 60 MG/ML ~~LOC~~ SOLN: 60.0000 mg | Freq: Once | SUBCUTANEOUS | Status: DC

## 2017-08-29 MED ORDER — DENOSUMAB 60 MG/ML ~~LOC~~ SOSY
60.0000 mg | PREFILLED_SYRINGE | Freq: Once | SUBCUTANEOUS | Status: AC
  Administered 2017-08-29: 60 mg via SUBCUTANEOUS

## 2017-08-29 NOTE — Progress Notes (Signed)
[facility] day:  08/29/2017  Chief Complaint: Allison Hall is a 77 y.o. female with multi-focal right breast cancer who is seen for 2 month assessment on Aromasin.  HPI:  The patient was last seen in the medical oncology clinic on 06/27/2017.  At that time, she was no longer taking hormonal therapy.  Arimidex had been discontinued secondary to arthralgias.  She was prescribed Aromasin.  Bilateral mammogram on 08/28/2017 revealed no evidence of malignancy.  During the interim, she has felt "better".  She denies any fatigue.  She denies any shoulder pain.  She notes knee pain secondary to "bone on bone".  Balance is described as not good.  She denies any falling.  Diet is "not good".  She denies any bleeding.  She denies any breast concerns.   Past Medical History:  Diagnosis Date  . Anxiety   . Breast cancer ([place]) 08/24/2016   right  . Cancer ([place])    skin ca  . Colon polyp   . Depression   . GERD (gastroesophageal reflux disease)   . Hypercholesteremia   . Hypertension   . Osteoarthritis   . Personal history of radiation therapy     Past Surgical History:  Procedure Laterality Date  . ABDOMINAL HYSTERECTOMY    . APPENDECTOMY    . BREAST BIOPSY Right 08/24/2016   9:30 - invasive mammary carcinoma with lobular features  . BREAST BIOPSY Right 08/24/2016   10:00 - invasive mammary carcinoma with lobular features  . BREAST LUMPECTOMY Right 10/09/2016  . JOINT REPLACEMENT    . NOSE SURGERY    . PARTIAL MASTECTOMY WITH NEEDLE LOCALIZATION Right 09/29/2016   Procedure: PARTIAL MASTECTOMY WITH NEEDLE LOCALIZATION;  Surgeon: [name], MD;  Location: [facility] ORS;  Service: General;  Laterality: Right;  . REPLACEMENT TOTAL KNEE Right 2008  . SENTINEL NODE BIOPSY Right 09/29/2016   Procedure: SENTINEL NODE BIOPSY;  Surgeon: [name], MD;  Location: [facility] ORS;  Service: General;  Laterality: Right;    Family History   Problem Relation Age of Onset  . Hypertension Mother   . Depression Mother   . Heart failure Father   . Obesity Brother   . Heart disease Brother   . Stroke Brother   . Depression Brother   . Breast cancer Cousin   . Breast cancer Cousin   . Breast cancer Cousin     Social History:  reports that she quit smoking about 52 years ago. She has a 10.00 pack-year smoking history. She has never used smokeless tobacco. She reports that she drinks about 4.2 oz of alcohol per week. She reports that she does not use drugs.  She previously smoked 1/2 pack/day x 10 years.  She stopped smoking on 10/11/1978.  She drinks 6 glasses of wine/week.  She has 3 children (2 sons and 1 daughter).  She is a retired Clinical cytogeneticist for [employer].  She likes to do crossword puzzles.  Her daughter's name is [name].  Her husband is in an assisted living facility [facility]) secondary to dementia.  She lives in [place].  She is alone today.  Allergies:  Allergies  Allergen Reactions  . Sulfamethoxazole Other (See Comments)    Anxiety and imsomnia  . Statins Other (See Comments)    Muscle pain   . Trazodone Other (See Comments)    Pt states she felt "unsteady"   . Ciprofloxacin Anxiety  . Sulfa Antibiotics Anxiety  Current Medications: Current Outpatient Medications  Medication Sig Dispense Refill  . acetaminophen (TYLENOL) 500 MG tablet Take 500 mg by mouth 2 (two) times daily.    Marland Kitchen amLODipine (NORVASC) 5 MG tablet Take 5 mg by mouth daily.     Marland Kitchen buPROPion (WELLBUTRIN SR) 150 MG 12 hr tablet Take 150 mg by mouth 2 (two) times daily.    . busPIRone (BUSPAR) 10 MG tablet Take 10 mg by mouth 3 (three) times daily.    . calcium carbonate 1250 MG capsule Take 1,250 mg by mouth 2 (two) times daily with a meal.    . Cholecalciferol (VITAMIN D3) 1000 units CAPS Take 1,000 Units by mouth 2 (two) times daily.    Marland Kitchen denosumab (PROLIA) 60 MG/ML SOLN injection Inject 60 mg into the skin every 6 (six) months. Administer  in upper arm, thigh, or abdomen    . exemestane (AROMASIN) 25 MG tablet TAKE 1 TABLET (25 MG TOTAL) BY MOUTH DAILY AFTER BREAKFAST. 90 tablet 3  . lisinopril (PRINIVIL,ZESTRIL) 20 MG tablet Take 20 mg by mouth daily.    Marland Kitchen loperamide (IMODIUM) 2 MG capsule Take 2 mg by mouth as needed for diarrhea or loose stools.     . Magnesium 250 MG TABS Take 2 tablets by mouth daily.    Marland Kitchen omeprazole (PRILOSEC) 40 MG capsule Take 20 mg by mouth.     . rosuvastatin (CRESTOR) 5 MG tablet Take 5 mg by mouth daily.    . sertraline (ZOLOFT) 100 MG tablet Take 1.5 tablets (150 mg total) by mouth daily. (Patient taking differently: Take 100-150 mg by mouth See admin instructions. Takes 100 mg daily except on Monday, Wednesday and Fridays and takes 150 mg (1.5 tablet)) 45 tablet 1  . Turmeric Curcumin 500 MG CAPS Take 1,500 mg by mouth daily.      No current facility-administered medications for this visit.     Review of Systems:  GENERAL:  Feels "better".  No fatigue.  No fevers, sweats or weight loss.  Weight stable. PERFORMANCE STATUS (ECOG):  0 HEENT:  No visual changes, runny nose, sore throat, mouth sores or tenderness. Lungs: No shortness of breath or cough.  No hemoptysis. Cardiac:  No chest pain, palpitations, orthopnea, or PND. GI:  No nausea, vomiting, diarrhea, constipation, melena or hematochezia. GU:  No urgency, frequency, dysuria, or hematuria. Musculoskeletal:  No back pain.  Knee pain (bone on bone).  No muscle tenderness. Extremities:  No pain or swelling. Skin:  No rashes or skin changes. Neuro:  Poor balance.  No headache, numbness or weakness, balance or coordination issues. Endocrine:  No diabetes, thyroid issues, hot flashes or night sweats. Psych:  Stress secondary to husband's illness.  No mood changes, depression or anxiety. Pain:  No focal pain. Review of systems:  All other systems reviewed and found to be negative.  Physical Exam: Blood pressure (!) 148/76, pulse 97,  temperature 98.7 F (37.1 C), temperature source Tympanic, resp. rate 18, weight 209 lb 7 oz (95 kg). GENERAL:  Well developed, well nourished, woman sitting comfortably in the exam room in no acute distress. MENTAL STATUS:  Alert and oriented to person, place and time. HEAD:  Styled dark hair.  Normocephalic, atraumatic, face symmetric, no Cushingoid features. EYES: Red glasses.  Green eyes.  Pupils equal round and reactive to light and accomodation.  No conjunctivitis or scleral icterus. ENT:  Oropharynx clear without lesion.  Tongue normal. Mucous membranes moist.  RESPIRATORY:  Clear to auscultation without rales,  wheezes or rhonchi. CARDIOVASCULAR:  Regular rate and rhythm without murmur, rub or gallop. BREAST:  Right breast with inferior edema.  Incision 10 - 11 o'clock.  No masses, skin changes or nipple discharge.  Left breast without masses, skin changes or nipple discharge. ABDOMEN:  Soft, non-tender, with active bowel sounds, and no hepatosplenomegaly.  No masses. SKIN:  No rashes, ulcers or lesions. EXTREMITIES: No edema, no skin discoloration or tenderness.  No palpable cords. LYMPH NODES: No palpable cervical, supraclavicular, axillary or inguinal adenopathy  NEUROLOGICAL: Unremarkable. PSYCH:  Appropriate.    No visits with results within 3 Day(s) from this visit.  Latest known visit with results is:  Hospital Outpatient Visit on 08/24/2016  Component Date Value Ref Range Status  . SURGICAL PATHOLOGY 08/24/2016    Final-Edited                   Value:Surgical Pathology THIS IS AN ADDENDUM REPORT CASE: [id number] PATIENT: [facility] Surgical Pathology Report Addendum  Reason for Addendum #1:  Immunohistochemistry results  SPECIMEN SUBMITTED: A. Breast, right, 9:30 B. Breast, right, 10:00  CLINICAL HISTORY: Masses  PRE-OPERATIVE DIAGNOSIS: IMC, possibly lobular  POST-OPERATIVE DIAGNOSIS: None provided.     DIAGNOSIS: A. BREAST, RIGHT 9:30;  ULTRASOUND GUIDED BIOPSY: - INVASIVE MAMMARY CARCINOMA, DIFFUSELY INFILTRATIVE WITH LOBULAR-LIKE FEATURES.  Size of invasive carcinoma: 6 mm in this sample Histologic grade of invasive carcinoma: 2      Glandular/tubular differentiation score: 3      Nuclear pleomorphism score: 2      Mitotic rate score: 1      Total score: 6  Ductal carcinoma in situ: Not identified Lymphovascular invasion: Not identified  B. Breast, right 10:00; ULTRASOUND GUIDED BIOPSY: - INVASIVE MAMMARY CARCINOMA, WITH LOBULAR FEATURES.  Size                          of invasive carcinoma: 8 mm in this sample Histologic grade of invasive carcinoma: 2      Glandular/tubular differentiation score: 3      Nuclear pleomorphism score: 2      Mitotic rate score: 1      Total score: 6  Ductal carcinoma in situ: Not identified Lymphovascular invasion: Not identified  Comment: The definitive grade will be assigned on the excisional specimen. The carcinoma is the two biopsy specimens have identical morphology. ER/PR/HER2: Immunohistochemistry will be performed on block A1, with reflex to [place] for HER2 2+. The results will be reported in an addendum. These findings were communicated to [place] in Dr. Janace Hoard office on 08/25/2016. Read back procedure was performed.     GROSS DESCRIPTION:  A. The specimen is received in a formalin-filled container labeled with the patient's name and right breast 9:30, 7 cm from nipple.  Core pieces: multiple, aggregate 1.6 x 0.4 x 0.1 cm Comments: red yellow lobulated fibrofatty tissue, ma                         rked green  Entirely submitted in cassette(s): 1  Time/Date in fixative: placed in formalin at 1:40 PM on 08/24/2016 cold ischemic time of less than 2 minutes Total fixation time: 6.7 Hours  B. The specimen is received in a formalin-filled container labeled with the patient's name and right breast #2 10:00, 5 cm from nipple.  Core pieces: multiple,  aggregate 1.7 x 0.5 x 0.1 cm Comments: yellow to pink lobulated fibrofatty, marked black  Entirely submitted in cassette(s): 1  Time/Date in fixative: placed in formalin at 1:50 PM on 08/24/2016 cold ischemic time of less than 2 minutes Total fixation time: 6.6 hours    Final Diagnosis performed by Quay Burow, MD.  Electronically signed 08/25/2016 2:54:14PM   The electronic signature indicates that the named Attending Pathologist has evaluated the specimen  Technical component performed at [facility], [address] [place], [place] [address] Lab: [phone] Dir: Darrick Penna. Evette Doffing, MD  Professional component perf                         ormed at [facility], [facility], [place], [place], [place] [address] Lab: [phone] Dir: Dellia Nims. Rubinas, MD   Breast Biomarker Reporting Template  BREAST BIOMARKER TESTS Estrogen Receptor (ER) Status: Positive, greater than 90%      Average intensity of staining: Strong Progesterone Receptor (PgR) Status: Positive, greater than 90%      Average intensity of staining: Strong HER2 (by immunohistochemistry): 2+ equivocal Percentage of cells with uniform intense complete membrane staining: 5% HER2 FISH pending.  METHODS Cold Ischemia and Fixation Times: Meet requirements specified in latest version of the ASCO/CAP guidelines Testing Performed on Block Number(s): A1 Fixative: Formalin Estrogen Receptor:  FDA cleared ([place])                    Primary Antibody:  SP1 Progesterone Receptor: FDA cleared ([place])                   Primary Antibody: 1E2 HER2 (by immunohistochemistry): FDA approved ([place])                             Prima                         ry Antibody: HercepTest  Immunohistochemistry controls worked appropriately.  Immunohistochemistry controls worked appropriately. Slides were prepared by [facility], [place], [place], and interpreted by Quay Burow,  MD.     Addendum #1 performed by Quay Burow, MD.  Electronically signed 08/30/2016 4:33:51PM    Technical component performed at [facility], [address], [place], [place] [address] Lab: [phone] Dir: Darrick Penna. Evette Doffing, MD  Professional component performed at [facility], [facility], [place], [place],  [address] Lab: [phone] Dir: Dellia Nims. Rubinas, MD      Assessment:  Allison Hall is a 77 y.o. female with multi-focal right breast cancer s/p partial mastectomy with sentinel lymph node biopsy on 09/29/2016.  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  Three sentinel lymph nodes were negative.  Tumor was ER + (> 90%), PR + (> 90%) and Her2/neu 2+ (negative by FISH).  Pathologic stage was T1c(m) N0(sn).  CA27.29 was 13.4 on 09/06/2016.  MammaPrint revealed a 29% risk of recurrence (high).  She was seen for second opinion at [facility] on 10/25/2016.  Chemotherapy was not recommended.  Bilateral screening mammogram on 07/24/2016 revealed a possible area of distortion in the right breast. Diagnostic mammogram on 08/10/2016 revealed 2 suspicious masses in the upper-outer quadrant of the right breast in a broad area of confluent distortion in the upper-outer quadrant of the right breast.  There are at least 3 areas of confluent distortion spanning approximately 6.5 cm of  tissue.  Ultrasound on 07/31/2016 revealed  an irregular hypoechoic 1.2 x 1.1 x 1.2 cm at the 9:30 position 7 cm from the nipple.  In addition, there was an irregular 1.9 x 0.8 x 1.2 cm mass at the 10 o'clock position 5 cm from the nipple.  There were multiple small normal-appearing lymph nodes.   Breast MRI on 09/16/2016 revealed an irregular 1.6 x 1.2 x 1.6 cm enhancing mass adjacent to the clip artifact within the upper, outer right breast, middle depth, corresponding to the mass seen at 10  o'clock. There was an additional irregular enhancing mass adjacent to the more posterior clip artifact measuring 2.6 x 1.4 x 1.6 cm, corresponding  to the mass seen at 9:30 position. Together, these 2 masses span 6.3 cm of breast tissue from anterior to posterior. There was no definite additional suspicious area of enhancement within the right breast to correspond to the third area of distortion identified on diagnostic mammogram dated 08/10/2016.  Left breast revealed no suspicious mass or enhancement.  Bilateral mammogram on 08/28/2017 revealed no evidence of malignancy.  She received radiation from 11/15/2016 - 01/08/2017.  She was Femara (01/17/2017 - 05/16/2017) secondary to arthralgias.  She discontinued Arimidex after 19 days (last 06/14/2017) secondary to fatigue and polyarthralgias.  She began Aromasin on 06/27/2017.  CA27.29 has been followed:  13.4 on 09/06/2016, 13.0 on 05/16/2017, and 17.1 on 08/29/2017.  Bone density study on 09/30/2015 revealed osteopenia with a T-score of -2.1 in the right femoral neck.  She began Prolia on 02/14/2017.  Symptomatically, she feels better on Aromasin.  She denies any fatigue or joint pain.  Exam is stable.  Hemoglobin is 10.7.  Plan: 1.  Labs today: CBC with diff, CMP, CA27.29.  2.  Discuss new mild normocytic anemia.  Discuss additional labs.  Per patient history, diet is poor.  She denies any melena, hematochezia or hematuria. 3.  Anemia work-up add: ferritin, iron studies, B12, folate, TSH. 4.  Prolia today. 5.  Bone density on 10/01/2017 6.  Continue calcium and vitamin D. 7.  Continue Aromasin. 8.  RTC in 3 months for MD assessment, labs (CBC with diff, CMP, CA27.29).   Lequita Asal, MD  08/29/2017, 3:35 PM

## 2017-08-29 NOTE — Progress Notes (Signed)
Patient offers no complaints today.  Patient is tolerating exemestane well.

## 2017-08-30 ENCOUNTER — Telehealth: Payer: Self-pay | Admitting: *Deleted

## 2017-08-30 LAB — IRON AND TIBC
Iron: 47 ug/dL (ref 28–170)
Saturation Ratios: 10 % — ABNORMAL LOW (ref 10.4–31.8)
TIBC: 479 ug/dL — ABNORMAL HIGH (ref 250–450)
UIBC: 432 ug/dL

## 2017-08-30 LAB — VITAMIN B12: Vitamin B-12: 263 pg/mL (ref 180–914)

## 2017-08-30 LAB — FOLATE: Folate: 8.4 ng/mL (ref >5.9)

## 2017-08-30 LAB — FERRITIN: Ferritin: 10 ng/mL — ABNORMAL LOW (ref 11–307)

## 2017-08-30 LAB — TSH: TSH: 1.522 u[IU]/mL (ref 0.350–4.500)

## 2017-08-30 LAB — CANCER ANTIGEN 27.29: CA 27.29: 17.1 U/mL (ref 0.0–38.6)

## 2017-08-30 NOTE — Telephone Encounter (Signed)
-----   Message from Lequita Asal, MD sent at 08/30/2017 11:48 AM EDT ----- Regarding: Please call patient  Ferritin low.  B12 low.  Try: oral B12 1000 mcg po q day and  ferrous sulfate 325 mg po q day (can increase to 325 mg po BID) with OJ or vitamin C.  Recheck labs (CBC, ferritin, B12) in 1 month.  M   ----- Message ----- From: Interface, Lab In [place] Sent: 08/29/2017   3:15 PM To: Lequita Asal, MD

## 2017-08-30 NOTE — Telephone Encounter (Signed)
Called patient and LVM that her ferritin and B-12 are low.  MD recommends she take 1000 mcg B-12 po daily.  Also Ferrous Sulfate 325 po daily.  If she can tolerate it she can take 325 mg BID.  She should take with Vitamin C or OJ.

## 2017-09-20 ENCOUNTER — Other Ambulatory Visit: Payer: Self-pay | Admitting: Hematology and Oncology

## 2017-10-02 ENCOUNTER — Ambulatory Visit
Admission: RE | Admit: 2017-10-02 | Discharge: 2017-10-02 | Disposition: A | Discharge status: Home / Self Care | Payer: Medicare HMO | Source: Ambulatory Visit | Attending: Hematology and Oncology | Admitting: Hematology and Oncology

## 2017-10-02 DIAGNOSIS — M85851 Other specified disorders of bone density and structure, right thigh: Secondary | ICD-10-CM | POA: Insufficient documentation

## 2017-10-30 ENCOUNTER — Inpatient Hospital Stay: Payer: Medicare HMO

## 2017-10-30 ENCOUNTER — Other Ambulatory Visit: Payer: Self-pay | Admitting: Urgent Care

## 2017-10-30 DIAGNOSIS — D509 Iron deficiency anemia, unspecified: Secondary | ICD-10-CM

## 2017-10-31 ENCOUNTER — Ambulatory Visit: Payer: Medicare HMO

## 2017-10-31 ENCOUNTER — Other Ambulatory Visit: Payer: Self-pay | Admitting: Hematology and Oncology

## 2017-10-31 ENCOUNTER — Inpatient Hospital Stay: Payer: Medicare HMO | Attending: Hematology and Oncology | Admitting: Hematology and Oncology

## 2017-10-31 ENCOUNTER — Inpatient Hospital Stay: Payer: Medicare HMO

## 2017-10-31 VITALS — BP 146/81 | HR 78 | Temp 98.0°F | Resp 18 | Wt 204.6 lb

## 2017-10-31 VITALS — BP 134/76 | HR 72 | Temp 97.6°F | Resp 18

## 2017-10-31 DIAGNOSIS — M85851 Other specified disorders of bone density and structure, right thigh: Secondary | ICD-10-CM | POA: Diagnosis not present

## 2017-10-31 DIAGNOSIS — Z710 Person encountering health services to consult on behalf of another person: Secondary | ICD-10-CM | POA: Insufficient documentation

## 2017-10-31 DIAGNOSIS — D509 Iron deficiency anemia, unspecified: Secondary | ICD-10-CM | POA: Insufficient documentation

## 2017-10-31 DIAGNOSIS — D649 Anemia, unspecified: Secondary | ICD-10-CM | POA: Diagnosis not present

## 2017-10-31 DIAGNOSIS — Z79811 Long term (current) use of aromatase inhibitors: Secondary | ICD-10-CM | POA: Diagnosis not present

## 2017-10-31 DIAGNOSIS — Z170 Estrogen receptor positive status [ER+]: Secondary | ICD-10-CM

## 2017-10-31 DIAGNOSIS — Z79899 Other long term (current) drug therapy: Secondary | ICD-10-CM | POA: Diagnosis not present

## 2017-10-31 DIAGNOSIS — C50411 Malignant neoplasm of upper-outer quadrant of right female breast: Secondary | ICD-10-CM | POA: Insufficient documentation

## 2017-10-31 DIAGNOSIS — M8580 Other specified disorders of bone density and structure, unspecified site: Secondary | ICD-10-CM

## 2017-10-31 DIAGNOSIS — E538 Deficiency of other specified B group vitamins: Secondary | ICD-10-CM | POA: Insufficient documentation

## 2017-10-31 MED ORDER — IRON SUCROSE 20 MG/ML IV SOLN
200.0000 mg | Freq: Once | INTRAVENOUS | Status: AC
  Administered 2017-10-31: 200 mg via INTRAVENOUS
  Filled 2017-10-31: qty 10

## 2017-10-31 MED ORDER — SODIUM CHLORIDE 0.9 % IV SOLN
Freq: Once | INTRAVENOUS | Status: AC
  Administered 2017-10-31: 10:00:00 via INTRAVENOUS
  Filled 2017-10-31: qty 1000

## 2017-10-31 MED ORDER — IRON SUCROSE 20 MG/ML IV SOLN
INTRAVENOUS | Status: AC
  Filled 2017-10-31: qty 10

## 2017-10-31 NOTE — Patient Instructions (Signed)
Iron Sucrose injection What is this medicine? IRON SUCROSE (AHY ern SOO krohs) is an iron complex. Iron is used to make healthy red blood cells, which carry oxygen and nutrients throughout the body. This medicine is used to treat iron deficiency anemia in people with chronic kidney disease. This medicine may be used for other purposes; ask your health care provider or pharmacist if you have questions. COMMON BRAND NAME(S): Venofer What should I tell my health care provider before I take this medicine? They need to know if you have any of these conditions: -anemia not caused by low iron levels -heart disease -high levels of iron in the blood -kidney disease -liver disease -an unusual or allergic reaction to iron, other medicines, foods, dyes, or preservatives -pregnant or trying to get pregnant -breast-feeding How should I use this medicine? This medicine is for infusion into a vein. It is given by a health care professional in a hospital or clinic setting. Talk to your pediatrician regarding the use of this medicine in children. While this drug may be prescribed for children as young as 2 years for selected conditions, precautions do apply. Overdosage: If you think you have taken too much of this medicine contact a poison control center or emergency room at once. NOTE: This medicine is only for you. Do not share this medicine with others. What if I miss a dose? It is important not to miss your dose. Call your doctor or health care professional if you are unable to keep an appointment. What may interact with this medicine? Do not take this medicine with any of the following medications: -deferoxamine -dimercaprol -other iron products This medicine may also interact with the following medications: -chloramphenicol -deferasirox This list may not describe all possible interactions. Give your health care provider a list of all the medicines, herbs, non-prescription drugs, or dietary  supplements you use. Also tell them if you smoke, drink alcohol, or use illegal drugs. Some items may interact with your medicine. What should I watch for while using this medicine? Visit your doctor or healthcare professional regularly. Tell your doctor or healthcare professional if your symptoms do not start to get better or if they get worse. You may need blood work done while you are taking this medicine. You may need to follow a special diet. Talk to your doctor. Foods that contain iron include: whole grains/cereals, dried fruits, beans, or peas, leafy green vegetables, and organ meats (liver, kidney). What side effects may I notice from receiving this medicine? Side effects that you should report to your doctor or health care professional as soon as possible: -allergic reactions like skin rash, itching or hives, swelling of the face, lips, or tongue -breathing problems -changes in blood pressure -cough -fast, irregular heartbeat -feeling faint or lightheaded, falls -fever or chills -flushing, sweating, or hot feelings -joint or muscle aches/pains -seizures -swelling of the ankles or feet -unusually weak or tired Side effects that usually do not require medical attention (report to your doctor or health care professional if they continue or are bothersome): -diarrhea -feeling achy -headache -irritation at site where injected -nausea, vomiting -stomach upset -tiredness This list may not describe all possible side effects. Call your doctor for medical advice about side effects. You may report side effects to FDA at 1-800-FDA-1088. Where should I keep my medicine? This drug is given in a hospital or clinic and will not be stored at home. NOTE: This sheet is a summary. It may not cover all possible information. If   you have questions about this medicine, talk to your doctor, pharmacist, or health care provider.  2018 Elsevier/Gold Standard (2010-12-22 17:14:35)  

## 2017-10-31 NOTE — Progress Notes (Signed)
[facility] day:  10/31/2017   Chief Complaint: Allison Hall is a 77 y.o. female with multi-focal right breast cancer who is seen for 2 month assessment on Aromasin.  HPI:  The patient was last seen in the medical oncology clinic on 08/29/2017.  At that time, she felt better on Aromasin.  She denied any fatigue or joint pain.  Exam was stable.  Hemoglobin was 10.7.  CA27.29 was 17.1.  She received Prolia.  She underwent a work-up for a new mild normocytic anemia.  Ferritin was 10 (low).  Iron saturation was 10% with a TIBC of 479.  B12 was 263 (low normal).  Folate was 8.4.  TSH was 1.522.  Creatinine was 1.12.  Bone density study was done on 10/02/2017 that revealed a T-score of -3.1 consistent with osteoporosis. She is on calcium, vitamin D, and Prolia.   She was contacted on 08/30/2017 regarding initiation of oral iron and B12.  Patient has lab work through her PCP on 10/23/2017. WBC 6800. Hemoglobin 11.8, hematocrit 36.4, MCV 88.0, and platelets 243,000. BUN 32 and creatinine 1.3. Iron saturation 11 with a TIBC of 471. Ferritin remained low at 10. She was advised to contact hematology office for iron infusion.   Patient presents today stating, "I came to take iron". Patient notes that she is fatigued and depressed. With the oral iron, patient notes "fecal leakage" and significant discomfort. Patient is not taking oral B12 as recommended. She denies an obvious GI bleeding. Colonoscopy is due in December 2019 with Dr. Vira Agar. PCP performed FOBT testing, which was found to be negative. Patient denies that she has experienced any B symptoms. She denies any interval infections.   Patient advises that she appetite is decreased. She is eating "ok". Patient maintains a diet rich in iron. She indicates that she making effort to eat more meat and green leafy vegetables on a consistent basis. Weight today is 204 lb 9.4 oz (92.8 kg), which compared to  her last visit to the clinic, represents a  5 pound decrease.   Patient denies pain in the clinic today.   Past Medical History:  Diagnosis Date  . Anxiety   . Breast cancer ([place]) 08/24/2016   right  . Cancer ([place])    skin ca  . Colon polyp   . Depression   . GERD (gastroesophageal reflux disease)   . Hypercholesteremia   . Hypertension   . Osteoarthritis   . Personal history of radiation therapy     Past Surgical History:  Procedure Laterality Date  . ABDOMINAL HYSTERECTOMY    . APPENDECTOMY    . BREAST BIOPSY Right 08/24/2016   9:30 - invasive mammary carcinoma with lobular features  . BREAST BIOPSY Right 08/24/2016   10:00 - invasive mammary carcinoma with lobular features  . BREAST LUMPECTOMY Right 10/09/2016  . JOINT REPLACEMENT    . NOSE SURGERY    . PARTIAL MASTECTOMY WITH NEEDLE LOCALIZATION Right 09/29/2016   Procedure: PARTIAL MASTECTOMY WITH NEEDLE LOCALIZATION;  Surgeon: [name], MD;  Location: [facility] ORS;  Service: General;  Laterality: Right;  . REPLACEMENT TOTAL KNEE Right 2008  . SENTINEL NODE BIOPSY Right 09/29/2016   Procedure: SENTINEL NODE BIOPSY;  Surgeon: [name], MD;  Location: [facility] ORS;  Service: General;  Laterality: Right;    Family History  Problem Relation Age of Onset  . Hypertension Mother   . Depression Mother   . Heart failure Father   .  Obesity Brother   . Heart disease Brother   . Stroke Brother   . Depression Brother   . Breast cancer Cousin   . Breast cancer Cousin   . Breast cancer Cousin     Social History:  reports that she quit smoking about 52 years ago. She has a 10.00 pack-year smoking history. She has never used smokeless tobacco. She reports that she drinks about 4.2 oz of alcohol per week. She reports that she does not use drugs.  She previously smoked 1/2 pack/day x 10 years.  She stopped smoking on 10/11/1978.  She drinks 6 glasses of wine/week.  She has 3 children (2 sons and 1 daughter).  She is  a retired Clinical cytogeneticist for [employer].  She likes to do crossword puzzles.  Her daughter's name is Allison Hall.  Her husband is in an assisted living facility [facility]) secondary to dementia.  She lives in [place].  She is alone today.  Allergies:  Allergies  Allergen Reactions  . Sulfamethoxazole Other (See Comments)    Anxiety and imsomnia  . Statins Other (See Comments)    Muscle pain   . Trazodone Other (See Comments)    Pt states she felt "unsteady"   . Ciprofloxacin Anxiety  . Sulfa Antibiotics Anxiety    Current Medications: Current Outpatient Medications  Medication Sig Dispense Refill  . acetaminophen (TYLENOL) 500 MG tablet Take 500 mg by mouth 2 (two) times daily.    Marland Kitchen amLODipine (NORVASC) 10 MG tablet Take 10 mg by mouth daily.    Marland Kitchen buPROPion (WELLBUTRIN XL) 300 MG 24 hr tablet Take 300 mg by mouth daily.    . busPIRone (BUSPAR) 10 MG tablet Take 10 mg by mouth 3 (three) times daily.    . calcium carbonate 1250 MG capsule Take 1,250 mg by mouth 2 (two) times daily with a meal.    . Cholecalciferol (VITAMIN D3) 1000 units CAPS Take 1,000 Units by mouth 2 (two) times daily.    Marland Kitchen denosumab (PROLIA) 60 MG/ML SOLN injection Inject 60 mg into the skin every 6 (six) months. Administer in upper arm, thigh, or abdomen    . exemestane (AROMASIN) 25 MG tablet TAKE 1 TABLET (25 MG TOTAL) BY MOUTH DAILY AFTER BREAKFAST. 90 tablet 3  . lisinopril (PRINIVIL,ZESTRIL) 40 MG tablet Take 40 mg by mouth daily.    Marland Kitchen loperamide (IMODIUM) 2 MG capsule Take 2 mg by mouth as needed for diarrhea or loose stools.     . Magnesium 250 MG TABS Take 2 tablets by mouth daily.    Marland Kitchen omeprazole (PRILOSEC) 40 MG capsule Take 20 mg by mouth.     . rosuvastatin (CRESTOR) 5 MG tablet Take 5 mg by mouth daily.    . sertraline (ZOLOFT) 100 MG tablet Take 1.5 tablets (150 mg total) by mouth daily. (Patient taking differently: Take 100-150 mg by mouth See admin instructions. Takes 100 mg daily except on Monday,  Wednesday and Fridays and takes 150 mg (1.5 tablet)) 45 tablet 1  . Turmeric Curcumin 500 MG CAPS Take 1,500 mg by mouth daily.      No current facility-administered medications for this visit.     Review of Systems  Constitutional: Positive for malaise/fatigue and weight loss (down 5 pounds). Negative for diaphoresis and fever.       "Im tired"  HENT: Negative.   Eyes: Negative.   Respiratory: Negative for cough, hemoptysis, sputum production and shortness of breath.   Cardiovascular: Negative for chest pain,  palpitations, orthopnea, leg swelling and PND.  Gastrointestinal: Negative for abdominal pain, blood in stool, constipation, diarrhea, melena, nausea and vomiting.       Appetite decreased.  Fecal leakage.  Genitourinary: Negative for dysuria, frequency, hematuria and urgency.  Musculoskeletal: Positive for joint pain (knees are "bone on bone"). Negative for back pain, falls and myalgias.  Skin: Negative for itching and rash.  Neurological: Negative for dizziness, tremors, weakness and headaches.       Poor balance  Endo/Heme/Allergies: Does not bruise/bleed easily.  Psychiatric/Behavioral: Positive for depression. Negative for memory loss and suicidal ideas. The patient is not nervous/anxious and does not have insomnia.   All other systems reviewed and are negative.  Performance status (ECOG): 0 - Asymptomatic  Vital Signs BP (!) 146/81 (BP Location: Left Arm, Patient Position: Sitting)   Pulse 78   Temp 98 F (36.7 C) (Tympanic)   Resp 18   Wt 204 lb 9.4 oz (92.8 kg)   BMI 34.05 kg/m   Physical Exam  Constitutional: She is oriented to person, place, and time and well-developed, well-nourished, and in no distress.  HENT:  Head: Normocephalic and atraumatic.  Short brown hair.  Eyes: Pupils are equal, round, and reactive to light. EOM are normal. No scleral icterus.  Glasses. Green eyes.  Neck: Normal range of motion. Neck supple. No tracheal deviation present. No  thyromegaly present.  Cardiovascular: Normal rate, regular rhythm and normal heart sounds. Exam reveals no gallop and no friction rub.  No murmur heard. Pulmonary/Chest: Effort normal and breath sounds normal. No respiratory distress. She has no wheezes. She has no rales. Right breast exhibits skin change (inferior edema. healed surgical incision 10-11 o'clock.). Right breast exhibits no inverted nipple, no mass and no nipple discharge. Left breast exhibits no inverted nipple, no mass, no nipple discharge and no skin change.  Abdominal: Soft. Bowel sounds are normal. She exhibits no distension. There is no tenderness.  Musculoskeletal: Normal range of motion. She exhibits no edema or tenderness.  Lymphadenopathy:    She has no cervical adenopathy.    She has no axillary adenopathy.       Right: No inguinal and no supraclavicular adenopathy present.       Left: No inguinal and no supraclavicular adenopathy present.  Neurological: She is alert and oriented to person, place, and time.  Skin: Skin is warm and dry. No rash noted. No erythema.  Psychiatric: Mood, affect and judgment normal.  Nursing note and vitals reviewed.   Appointment on 08/29/2017  Component Date Value Ref Range Status  . CA 27.29 08/29/2017 17.1  0.0 - 38.6 U/mL Final   Comment: (NOTE) Siemens Centaur Immunochemiluminometric Methodology [facility]) Values obtained with different assay methods or kits cannot be used interchangeably. Results cannot be interpreted as absolute evidence of the presence or absence of malignant disease. Performed At: [facility] [place], [place] [id number] Rush Farmer MD [id number] Performed at [facility], [address], [place], [place] [address]   . Sodium 08/29/2017 137  135 - 145 mmol/L Final  . Potassium 08/29/2017 3.9  3.5 - 5.1 mmol/L Final  . Chloride 08/29/2017 103  101 - 111 mmol/L Final  . CO2 08/29/2017 22  22 - 32 mmol/L Final  .  Glucose, Bld 08/29/2017 107* 65 - 99 mg/dL Final  . BUN 08/29/2017 19  6 - 20 mg/dL Final  . Creatinine, Ser 08/29/2017 1.12* 0.44 - 1.00 mg/dL Final  . Calcium 08/29/2017 9.1  8.9 - 10.3 mg/dL Final  . Total Protein 08/29/2017 7.5  6.5 - 8.1 g/dL Final  . Albumin 08/29/2017 4.8  3.5 - 5.0 g/dL Final  . AST 08/29/2017 23  15 - 41 U/L Final  . ALT 08/29/2017 17  14 - 54 U/L Final  . Alkaline Phosphatase 08/29/2017 55  38 - 126 U/L Final  . Total Bilirubin 08/29/2017 0.3  0.3 - 1.2 mg/dL Final  . GFR calc non Af Amer 08/29/2017 46* >60 mL/min Final  . GFR calc Af Amer 08/29/2017 53* >60 mL/min Final   Comment: (NOTE) The eGFR has been calculated using the CKD EPI equation. This calculation has not been validated in all clinical situations. eGFR's persistently <60 mL/min signify possible Chronic Kidney Disease.   Georgiann Hahn gap 08/29/2017 12  5 - 15 Final   Performed at [facility], [address], [place], [place] [address]  . WBC 08/29/2017 6.0  3.6 - 11.0 K/uL Final  . RBC 08/29/2017 3.84  3.80 - 5.20 MIL/uL Final  . Hemoglobin 08/29/2017 10.7* 12.0 - 16.0 g/dL Final  . HCT 08/29/2017 32.4* 35.0 - 47.0 % Final  . MCV 08/29/2017 84.3  80.0 - 100.0 fL Final  . MCH 08/29/2017 27.8  26.0 - 34.0 pg Final  . MCHC 08/29/2017 32.9  32.0 - 36.0 g/dL Final  . RDW 08/29/2017 15.6* 11.5 - 14.5 % Final  . Platelets 08/29/2017 252  150 - 440 K/uL Final  . Neutrophils Relative % 08/29/2017 67  % Final  . Neutro Abs 08/29/2017 4.0  1.4 - 6.5 K/uL Final  . Lymphocytes Relative 08/29/2017 20  % Final  . Lymphs Abs 08/29/2017 1.2  1.0 - 3.6 K/uL Final  . Monocytes Relative 08/29/2017 10  % Final  . Monocytes Absolute 08/29/2017 0.6  0.2 - 0.9 K/uL Final  . Eosinophils Relative 08/29/2017 2  % Final  . Eosinophils Absolute 08/29/2017 0.1  0 - 0.7 K/uL Final  . Basophils Relative 08/29/2017 1  % Final  . Basophils Absolute 08/29/2017 0.1  0 - 0.1 K/uL Final   Performed at [facility], [address], [place], [place] [address]  . TSH 08/29/2017 1.522  0.350 - 4.500 uIU/mL Final   Comment: Performed by a 3rd Generation assay with a functional sensitivity of <=0.01 uIU/mL. Performed at [facility], [address] [place], [place] [address]   . Iron 08/29/2017 47  28 - 170 ug/dL Final  . TIBC 08/29/2017 479* 250 - 450 ug/dL Final  . Saturation Ratios 08/29/2017 10* 10.4 - 31.8 % Final  . UIBC 08/29/2017 432  ug/dL Final   Performed at [facility], [address], [place], [place] [address]  . Ferritin 08/29/2017 10* 11 - 307 ng/mL Final   Performed at [facility], [address] [place], [place] [address]  . Folate 08/29/2017 8.4  >5.9 ng/mL Final   Performed at [facility], [address] [place], [place] [address]  . Vitamin B-12 08/29/2017 263  180 - 914 pg/mL Final   Comment: (NOTE) This assay is not validated for testing neonatal or myeloproliferative syndrome specimens for Vitamin B12 levels. Performed at [facility], [place] [address], [place], [place] [address]     Assessment:  JANINA TRAFTON is a 77 y.o. female with multi-focal right breast cancer s/p partial mastectomy with sentinel lymph node biopsy on 09/29/2016.  Pathology revealed a 1.5 cm grade I invasive carcinoma,  tubulo-lobular variant and a 1.6 cm grade I invasive carcinoma, tubulo-lobular variant.  Three sentinel lymph nodes were negative.  Tumor was ER + (> 90%), PR + (> 90%) and Her2/neu 2+ (negative by FISH).  Pathologic stage was T1c(m) N0(sn).  CA27.29 was 13.4 on 09/06/2016.  MammaPrint revealed a 29% risk of recurrence (high).  She was seen for second opinion at [facility] on 10/25/2016.  Chemotherapy was not recommended.  Bilateral screening mammogram on 07/24/2016 revealed a possible area of distortion in the right breast. Diagnostic mammogram on 08/10/2016 revealed 2 suspicious masses in the upper-outer quadrant of the right  breast in a broad area of confluent distortion in the upper-outer quadrant of the right breast.  There are at least 3 areas of confluent distortion spanning approximately 6.5 cm of tissue.  Ultrasound on 07/31/2016 revealed  an irregular hypoechoic 1.2 x 1.1 x 1.2 cm at the 9:30 position 7 cm from the nipple.  In addition, there was an irregular 1.9 x 0.8 x 1.2 cm mass at the 10 o'clock position 5 cm from the nipple.  There were multiple small normal-appearing lymph nodes.   Breast MRI on 09/16/2016 revealed an irregular 1.6 x 1.2 x 1.6 cm enhancing mass adjacent to the clip artifact within the upper, outer right breast, middle depth, corresponding to the mass seen at 10 o'clock. There was an additional irregular enhancing mass adjacent to the more posterior clip artifact measuring 2.6 x 1.4 x 1.6 cm, corresponding  to the mass seen at 9:30 position. Together, these 2 masses span 6.3 cm of breast tissue from anterior to posterior. There was no definite additional suspicious area of enhancement within the right breast to correspond to the third area of distortion identified on diagnostic mammogram dated 08/10/2016.  Left breast revealed no suspicious mass or enhancement.  Bilateral mammogram on 08/28/2017 revealed no evidence of malignancy.  She received radiation from 11/15/2016 - 01/08/2017.  She was Femara (01/17/2017 - 05/16/2017) secondary to arthralgias.  She discontinued Arimidex after 19 days (last 06/14/2017) secondary to fatigue and polyarthralgias.  She began Aromasin on 06/27/2017.  CA27.29 has been followed:  13.4 on 09/06/2016, 13.0 on 05/16/2017, and 17.1 on 08/29/2017.  Bone density study on 09/30/2015 revealed osteopenia with a T-score of -2.1 in the right femoral neck.  Bone density study on 10/02/2017 that revealed a T-score of -3.1 consistent with osteoporosis. She began Prolia on 02/14/2017 (last 08/29/2017).  She was diagnosed with iron deficiency anemia and B12 deficiency on  08/29/2017.  TSH and folate were normal.  Symptomatically, patient is fatigued. Patient denies any B symptoms. She denies any interval infections. Patient denies bleeding; no hematochezia, melena, or gross hematuria.  Exam is stable.    Plan: 1. Labs today: CBC with diff, ferritin, B12, anti-parietal antibody, intrinsic factor antibody. 2. Breast cancer - stable  Doing well. No breast concerns.   Continue on Aromasin as previously prescribed.  3. Iron deficiency anemia - ongoing  Symptomatic. Hemoglobin 11.4. MCV 88.  Ferritin low at 10.   Recent guaiac negative stools. No obvious GI blood loss.  Patient followed by GI and has a repeat colonoscopy scheduled for December 2019.  Patient did not tolerate oral iron due to it causing "anal leakage" and discomfort.  Will schedule weekly Venofer 200 mg IV x 3 (first infusion today).  4. Osteoporosis - stable  Review bone density. T-score -3.1, which is consistent with osteoporosis.   Discuss ten-year fracture probability by FRAX of 3% or greater for hip  fracture or 20% or greater for major osteoporotic fracture.  Continue calcium 1200 mg and vitamin D 800 IU supplementation.  Continue Prolia injections every 6 months (due in 02/2018). 5. B12 deficiency - ongoing  Encourage oral B12 1000 mcg po q day.  6. RTC in 6 weeks for labs (CBC with diff, ferritin).  7. RTC in 3 months for MD assessment, labs (CBC with diff, ferritin - day before), and +/- Venofer.    Honor Loh, NP  10/31/17, 10:06 AM  I saw and evaluated the patient, participating in the key portions of the service and reviewing pertinent diagnostic studies and records.  I reviewed the nurse practitioner's note and agree with the findings and the plan.  The assessment and plan were discussed with the patient.  Multiple questions were asked by the patient and answered.   Nolon Stalls, MD 10/31/2017,10:06 AM

## 2017-10-31 NOTE — Progress Notes (Signed)
Patient here today for possible iron infusion.  Has had a lot of changes in medications from PCP.  States she is tired.  Otherwise, offers no complaints.

## 2017-11-07 ENCOUNTER — Inpatient Hospital Stay: Payer: Medicare HMO

## 2017-11-07 VITALS — BP 126/73 | HR 81 | Temp 97.4°F | Resp 18

## 2017-11-07 DIAGNOSIS — M8580 Other specified disorders of bone density and structure, unspecified site: Secondary | ICD-10-CM

## 2017-11-07 DIAGNOSIS — C50411 Malignant neoplasm of upper-outer quadrant of right female breast: Secondary | ICD-10-CM | POA: Diagnosis not present

## 2017-11-07 MED ORDER — IRON SUCROSE 20 MG/ML IV SOLN
INTRAVENOUS | Status: AC
  Filled 2017-11-07: qty 10

## 2017-11-07 MED ORDER — IRON SUCROSE 20 MG/ML IV SOLN
200.0000 mg | Freq: Once | INTRAVENOUS | Status: AC
  Administered 2017-11-07: 200 mg via INTRAVENOUS
  Filled 2017-11-07: qty 10

## 2017-11-07 MED ORDER — SODIUM CHLORIDE 0.9 % IV SOLN
Freq: Once | INTRAVENOUS | Status: AC
  Administered 2017-11-07: 11:00:00 via INTRAVENOUS
  Filled 2017-11-07: qty 1000

## 2017-11-07 MED ORDER — SODIUM CHLORIDE 0.9 % IV SOLN
Freq: Once | INTRAVENOUS | Status: DC
  Filled 2017-11-07: qty 1000

## 2017-11-13 ENCOUNTER — Inpatient Hospital Stay: Payer: Medicare HMO

## 2017-11-13 VITALS — BP 167/80 | HR 90 | Temp 96.8°F | Resp 18

## 2017-11-13 DIAGNOSIS — M8580 Other specified disorders of bone density and structure, unspecified site: Secondary | ICD-10-CM

## 2017-11-13 DIAGNOSIS — C50411 Malignant neoplasm of upper-outer quadrant of right female breast: Secondary | ICD-10-CM | POA: Diagnosis not present

## 2017-11-13 MED ORDER — SODIUM CHLORIDE 0.9 % IV SOLN
Freq: Once | INTRAVENOUS | Status: AC
  Administered 2017-11-13: 14:00:00 via INTRAVENOUS
  Filled 2017-11-13: qty 250

## 2017-11-13 MED ORDER — IRON SUCROSE 20 MG/ML IV SOLN
200.0000 mg | Freq: Once | INTRAVENOUS | Status: AC
  Administered 2017-11-13: 200 mg via INTRAVENOUS
  Filled 2017-11-13 (×2): qty 10

## 2017-11-13 MED ORDER — SODIUM CHLORIDE 0.9 % IV SOLN: 200.0000 mg | Freq: Once | INTRAVENOUS | Status: DC

## 2017-11-14 ENCOUNTER — Ambulatory Visit: Payer: Medicare HMO

## 2017-11-28 ENCOUNTER — Ambulatory Visit: Payer: Medicare HMO | Admitting: Hematology and Oncology

## 2017-11-28 ENCOUNTER — Other Ambulatory Visit: Payer: Medicare HMO

## 2017-12-03 ENCOUNTER — Encounter: Payer: Self-pay | Admitting: Emergency Medicine

## 2017-12-03 ENCOUNTER — Ambulatory Visit
Admission: RE | Admit: 2017-12-03 | Discharge: 2017-12-03 | Disposition: A | Discharge status: Home / Self Care | Payer: Medicare HMO | Source: Ambulatory Visit | Attending: Unknown Physician Specialty | Admitting: Unknown Physician Specialty

## 2017-12-03 ENCOUNTER — Ambulatory Visit: Payer: Medicare HMO | Admitting: Anesthesiology

## 2017-12-03 ENCOUNTER — Encounter
Admission: RE | Disposition: A | Discharge status: Home / Self Care | Payer: Self-pay | Source: Ambulatory Visit | Attending: Unknown Physician Specialty

## 2017-12-03 DIAGNOSIS — K644 Residual hemorrhoidal skin tags: Secondary | ICD-10-CM | POA: Insufficient documentation

## 2017-12-03 DIAGNOSIS — Z803 Family history of malignant neoplasm of breast: Secondary | ICD-10-CM | POA: Insufficient documentation

## 2017-12-03 DIAGNOSIS — D125 Benign neoplasm of sigmoid colon: Secondary | ICD-10-CM | POA: Insufficient documentation

## 2017-12-03 DIAGNOSIS — Z8489 Family history of other specified conditions: Secondary | ICD-10-CM | POA: Insufficient documentation

## 2017-12-03 DIAGNOSIS — Z79899 Other long term (current) drug therapy: Secondary | ICD-10-CM | POA: Insufficient documentation

## 2017-12-03 DIAGNOSIS — Z1211 Encounter for screening for malignant neoplasm of colon: Secondary | ICD-10-CM | POA: Insufficient documentation

## 2017-12-03 DIAGNOSIS — Z818 Family history of other mental and behavioral disorders: Secondary | ICD-10-CM | POA: Insufficient documentation

## 2017-12-03 DIAGNOSIS — K640 First degree hemorrhoids: Secondary | ICD-10-CM | POA: Insufficient documentation

## 2017-12-03 DIAGNOSIS — K2970 Gastritis, unspecified, without bleeding: Secondary | ICD-10-CM | POA: Diagnosis not present

## 2017-12-03 DIAGNOSIS — Z96651 Presence of right artificial knee joint: Secondary | ICD-10-CM | POA: Insufficient documentation

## 2017-12-03 DIAGNOSIS — Z853 Personal history of malignant neoplasm of breast: Secondary | ICD-10-CM | POA: Diagnosis not present

## 2017-12-03 DIAGNOSIS — Z90710 Acquired absence of both cervix and uterus: Secondary | ICD-10-CM | POA: Diagnosis not present

## 2017-12-03 DIAGNOSIS — M1990 Unspecified osteoarthritis, unspecified site: Secondary | ICD-10-CM | POA: Diagnosis not present

## 2017-12-03 DIAGNOSIS — I10 Essential (primary) hypertension: Secondary | ICD-10-CM | POA: Diagnosis not present

## 2017-12-03 DIAGNOSIS — D509 Iron deficiency anemia, unspecified: Secondary | ICD-10-CM | POA: Diagnosis not present

## 2017-12-03 DIAGNOSIS — F329 Major depressive disorder, single episode, unspecified: Secondary | ICD-10-CM | POA: Diagnosis not present

## 2017-12-03 DIAGNOSIS — K209 Esophagitis, unspecified: Secondary | ICD-10-CM | POA: Insufficient documentation

## 2017-12-03 DIAGNOSIS — Z882 Allergy status to sulfonamides status: Secondary | ICD-10-CM | POA: Diagnosis not present

## 2017-12-03 DIAGNOSIS — Z86010 Personal history of colonic polyps: Secondary | ICD-10-CM | POA: Diagnosis not present

## 2017-12-03 DIAGNOSIS — Z87891 Personal history of nicotine dependence: Secondary | ICD-10-CM | POA: Insufficient documentation

## 2017-12-03 DIAGNOSIS — Z888 Allergy status to other drugs, medicaments and biological substances status: Secondary | ICD-10-CM | POA: Insufficient documentation

## 2017-12-03 DIAGNOSIS — F419 Anxiety disorder, unspecified: Secondary | ICD-10-CM | POA: Diagnosis not present

## 2017-12-03 DIAGNOSIS — Z85828 Personal history of other malignant neoplasm of skin: Secondary | ICD-10-CM | POA: Diagnosis not present

## 2017-12-03 DIAGNOSIS — Z923 Personal history of irradiation: Secondary | ICD-10-CM | POA: Insufficient documentation

## 2017-12-03 DIAGNOSIS — K219 Gastro-esophageal reflux disease without esophagitis: Secondary | ICD-10-CM | POA: Insufficient documentation

## 2017-12-03 DIAGNOSIS — D123 Benign neoplasm of transverse colon: Secondary | ICD-10-CM | POA: Diagnosis not present

## 2017-12-03 DIAGNOSIS — Z823 Family history of stroke: Secondary | ICD-10-CM | POA: Insufficient documentation

## 2017-12-03 DIAGNOSIS — E7800 Pure hypercholesterolemia, unspecified: Secondary | ICD-10-CM | POA: Diagnosis not present

## 2017-12-03 DIAGNOSIS — Z881 Allergy status to other antibiotic agents status: Secondary | ICD-10-CM | POA: Diagnosis not present

## 2017-12-03 DIAGNOSIS — Z8249 Family history of ischemic heart disease and other diseases of the circulatory system: Secondary | ICD-10-CM | POA: Insufficient documentation

## 2017-12-03 HISTORY — DX: Nontoxic single thyroid nodule: E04.1

## 2017-12-03 HISTORY — PX: ESOPHAGOGASTRODUODENOSCOPY (EGD) WITH PROPOFOL: SHX5813

## 2017-12-03 HISTORY — PX: COLONOSCOPY WITH PROPOFOL: SHX5780

## 2017-12-03 SURGERY — COLONOSCOPY WITH PROPOFOL
Anesthesia: General

## 2017-12-03 MED ORDER — GLYCOPYRROLATE 0.2 MG/ML IJ SOLN
INTRAMUSCULAR | Status: AC
  Filled 2017-12-03: qty 1

## 2017-12-03 MED ORDER — LIDOCAINE HCL (PF) 2 % IJ SOLN
INTRAMUSCULAR | Status: DC | PRN
  Administered 2017-12-03: 100 mg

## 2017-12-03 MED ORDER — FENTANYL CITRATE (PF) 100 MCG/2ML IJ SOLN
INTRAMUSCULAR | Status: DC | PRN
  Administered 2017-12-03: 25 ug via INTRAVENOUS
  Administered 2017-12-03: 50 ug via INTRAVENOUS
  Administered 2017-12-03: 25 ug via INTRAVENOUS

## 2017-12-03 MED ORDER — PROPOFOL 10 MG/ML IV BOLUS
INTRAVENOUS | Status: AC
  Filled 2017-12-03: qty 20

## 2017-12-03 MED ORDER — MIDAZOLAM HCL 5 MG/5ML IJ SOLN
INTRAMUSCULAR | Status: DC | PRN
  Administered 2017-12-03 (×2): 1 mg via INTRAVENOUS

## 2017-12-03 MED ORDER — LIDOCAINE HCL (PF) 2 % IJ SOLN
INTRAMUSCULAR | Status: AC
  Filled 2017-12-03: qty 10

## 2017-12-03 MED ORDER — FENTANYL CITRATE (PF) 100 MCG/2ML IJ SOLN
INTRAMUSCULAR | Status: AC
  Filled 2017-12-03: qty 2

## 2017-12-03 MED ORDER — ESMOLOL HCL 100 MG/10ML IV SOLN
INTRAVENOUS | Status: AC
  Filled 2017-12-03: qty 10

## 2017-12-03 MED ORDER — PROPOFOL 10 MG/ML IV BOLUS
INTRAVENOUS | Status: DC | PRN
  Administered 2017-12-03: 20 mg via INTRAVENOUS
  Administered 2017-12-03 (×2): 10 mg via INTRAVENOUS
  Administered 2017-12-03 (×2): 20 mg via INTRAVENOUS

## 2017-12-03 MED ORDER — PIPERACILLIN-TAZOBACTAM 3.375 G IVPB
3.3750 g | Freq: Once | INTRAVENOUS | Status: AC
  Administered 2017-12-03: 3.375 g via INTRAVENOUS

## 2017-12-03 MED ORDER — MIDAZOLAM HCL 2 MG/2ML IJ SOLN
INTRAMUSCULAR | Status: AC
  Filled 2017-12-03: qty 2

## 2017-12-03 MED ORDER — GLYCOPYRROLATE 0.2 MG/ML IJ SOLN
INTRAMUSCULAR | Status: DC | PRN
  Administered 2017-12-03: 0.2 mg via INTRAVENOUS

## 2017-12-03 MED ORDER — SODIUM CHLORIDE 0.9 % IV SOLN
INTRAVENOUS | Status: DC
  Administered 2017-12-03: 16:00:00 via INTRAVENOUS
  Administered 2017-12-03: 1000 mL via INTRAVENOUS

## 2017-12-03 MED ORDER — PROPOFOL 500 MG/50ML IV EMUL
INTRAVENOUS | Status: AC
  Filled 2017-12-03: qty 50

## 2017-12-03 MED ORDER — PROPOFOL 500 MG/50ML IV EMUL
INTRAVENOUS | Status: DC | PRN
  Administered 2017-12-03: 50 ug/kg/min via INTRAVENOUS

## 2017-12-03 MED ORDER — PIPERACILLIN-TAZOBACTAM 3.375 G IVPB
INTRAVENOUS | Status: AC
  Filled 2017-12-03: qty 50

## 2017-12-03 MED ORDER — ESMOLOL HCL 100 MG/10ML IV SOLN
INTRAVENOUS | Status: DC | PRN
  Administered 2017-12-03: 20 mg via INTRAVENOUS
  Administered 2017-12-03: 10 mg via INTRAVENOUS

## 2017-12-03 NOTE — Transfer of Care (Signed)
Immediate Anesthesia Transfer of Care Note  Patient: Allison Hall  Procedure(s) Performed: COLONOSCOPY WITH PROPOFOL (N/A ) ESOPHAGOGASTRODUODENOSCOPY (EGD) WITH PROPOFOL (N/A )  Patient Location: PACU  Anesthesia Type:General  Level of Consciousness: sedated  Airway & Oxygen Therapy: Patient Spontanous Breathing  Post-op Assessment: Report given to RN and Post -op Vital signs reviewed and stable  Post vital signs: Reviewed and stable  Last Vitals:  Vitals Value Taken Time  BP    Temp    Pulse    Resp    SpO2      Last Pain:  Vitals:   12/03/17 1408  TempSrc: Tympanic  PainSc: 0-No pain         Complications: No apparent anesthesia complications

## 2017-12-03 NOTE — Anesthesia Post-op Follow-up Note (Signed)
Anesthesia QCDR form completed.        

## 2017-12-03 NOTE — Op Note (Signed)
[facility] Gastroenterology Patient Name: Allison Hall Procedure Date: 12/03/2017 2:32 PM MRN: [id number] Account #: [id number] Date of Birth: 1940-12-16 Admit Type: Outpatient Age: 77 Room: [facility] ENDO ROOM 1 Gender: Female Note Status: Finalized Procedure:            Colonoscopy Indications:          High risk colon cancer surveillance: Personal history                        of colonic polyps Providers:            Manya Silvas, MD Referring MD:         Bo Mcclintock. Vickki Muff (Referring MD) Medicines:            Propofol per Anesthesia Complications:        No immediate complications. Procedure:            Pre-Anesthesia Assessment:                       - After reviewing the risks and benefits, the patient                        was deemed in satisfactory condition to undergo the                        procedure.                       After obtaining informed consent, the colonoscope was                        passed under direct vision. Throughout the procedure,                        the patient's blood pressure, pulse, and oxygen                        saturations were monitored continuously. The                        Colonoscope was introduced through the anus and                        advanced to the the transverse colon to examine a                        polypectomy site. This was the intended extent. The                        colonoscopy was somewhat difficult due to a tortuous                        colon. The colonoscopy was somewhat difficult due to                        significant looping. The patient tolerated the                        procedure well. The quality of the bowel preparation  was good. Findings:      A 12 mm polyp was found in the proximal transverse colon. The polyp was       sessile. The polyp was removed with a hot snare. Resection and retrieval       were complete. To prevent bleeding after the  polypectomy, four       hemostatic clips were successfully placed. There was no bleeding during,       or at the end, of the procedure. I decided to not pass the scope to the       proximal end of the colon due to the 4 clips that would be affected by       the scope and the possibility of damage to the mucosa from the scope       presssing on the clips and the polypectomy site.      A diminutive polyp was found in the sigmoid colon. The polyp was       sessile. The polyp was removed with a hot snare. Resection was complete,       but the polyp tissue was not retrieved.      External and internal hemorrhoids were found during endoscopy. The       hemorrhoids were small and Grade I (internal hemorrhoids that do not       prolapse).      The exam was otherwise without abnormality. Impression:           - One 12 mm polyp in the proximal transverse colon,                        removed with a hot snare. Resected and retrieved. Clips                        were placed.                       - One diminutive polyp in the sigmoid colon, removed                        with a hot snare. Complete resection. Polyp tissue not                        retrieved.                       - External and internal hemorrhoids.                       - The examination was otherwise normal. Recommendation:       - Await pathology results. Manya Silvas, MD 12/03/2017 3:54:06 PM This report has been signed electronically. Number of Addenda: 0 Note Initiated On: 12/03/2017 2:32 PM Total Procedure Duration: 0 hours 38 minutes 22 seconds       [facility]

## 2017-12-03 NOTE — Op Note (Signed)
[facility] Patient Name: Allison Hall Procedure Date: 12/03/2017 2:34 PM MRN: [id number] Account #: [id number] Date of Birth: [birth year] Admit Type: Outpatient Age: 77 Room: [facility] ENDO ROOM 1 Gender: Female Note Status: Finalized Procedure:            Upper GI endoscopy Indications:          Unexplained iron deficiency anemia Providers:            Manya Silvas, MD Referring MD:         Bo Mcclintock. Vickki Muff (Referring MD) Medicines:            Propofol per Anesthesia Procedure:            Pre-Anesthesia Assessment:                       - After reviewing the risks and benefits, the patient                        was deemed in satisfactory condition to undergo the                        procedure.                       After obtaining informed consent, the endoscope was                        passed under direct vision. Throughout the procedure,                        the patient's blood pressure, pulse, and oxygen                        saturations were monitored continuously. The Endoscope                        was introduced through the mouth, and advanced to the                        second part of duodenum. The upper GI endoscopy was                        accomplished without difficulty. The patient tolerated                        the procedure well. Findings:      There were esophageal mucosal changes suggestive of short-segment       Barrett's esophagus present in the distal esophagus. The maximum       longitudinal extent of these mucosal changes was 2 cm in length. Mucosa       was biopsied with a cold forceps for histology. One specimen bottle was       sent to pathology.      Patchy minimal inflammation characterized by erythema and granularity       was found in the gastric body. Biopsies were taken with a cold forceps       for histology. Biopsies were taken with a cold forceps for Helicobacter       pylori testing.  Mildly erythematous mucosa without active bleeding and with no stigmata       of bleeding  was found in the duodenal bulb. Impression:           - Esophageal mucosal changes suggestive of                        short-segment Barrett's esophagus. Biopsied.                       - Gastritis. Biopsied.                       - Normal duodenum Recommendation:       - Await pathology results. Manya Silvas, MD 12/03/2017 2:55:56 PM This report has been signed electronically. Number of Addenda: 0 Note Initiated On: 12/03/2017 2:34 PM      [facility]

## 2017-12-03 NOTE — Anesthesia Preprocedure Evaluation (Signed)
Anesthesia Evaluation  Patient identified by MRN, date of birth, ID band Patient awake    Reviewed: Allergy & Precautions, H&P , NPO status , reviewed documented beta blocker date and time   Airway Mallampati: III  TM Distance: >3 FB Neck ROM: full    Dental  (+) Caps, Chipped, Poor Dentition   Pulmonary former smoker,    Pulmonary exam normal        Cardiovascular hypertension, Normal cardiovascular exam     Neuro/Psych PSYCHIATRIC DISORDERS Anxiety Depression  Neuromuscular disease    GI/Hepatic GERD  Medicated and Controlled,  Endo/Other    Renal/GU      Musculoskeletal  (+) Arthritis ,   Abdominal   Peds  Hematology  (+) anemia ,   Anesthesia Other Findings Past Medical History: No date: Anxiety 08/24/2016: Breast cancer ([place])     Comment:  right No date: Cancer ([place])     Comment:  skin ca No date: Colon polyp No date: Depression No date: GERD (gastroesophageal reflux disease) No date: Hypercholesteremia No date: Hypertension No date: Osteoarthritis No date: Personal history of radiation therapy 04/17/2016: Thyroid nodule  Past Surgical History: No date: ABDOMINAL HYSTERECTOMY No date: APPENDECTOMY 08/24/2016: BREAST BIOPSY; Right     Comment:  9:30 - invasive mammary carcinoma with lobular features 08/24/2016: BREAST BIOPSY; Right     Comment:  10:00 - invasive mammary carcinoma with lobular features 10/09/2016: BREAST LUMPECTOMY; Right 2003, 2008, 2014: COLONOSCOPY W/ POLYPECTOMY No date: ESOPHAGOGASTRODUODENOSCOPY     Comment:  foreign body 2008: JOINT REPLACEMENT; Right     Comment:  TKR No date: NOSE SURGERY 09/29/2016: PARTIAL MASTECTOMY WITH NEEDLE LOCALIZATION; Right     Comment:  Procedure: PARTIAL MASTECTOMY WITH NEEDLE LOCALIZATION;               Surgeon: [name], MD;  Location: [facility] ORS;                Service: General;  Laterality: Right; 2008: REPLACEMENT TOTAL KNEE;  Right 09/29/2016: SENTINEL NODE BIOPSY; Right     Comment:  Procedure: SENTINEL NODE BIOPSY;  Surgeon: [name], MD;  Location: [facility] ORS;  Service: General;                Laterality: Right;     Reproductive/Obstetrics                             Anesthesia Physical Anesthesia Plan  ASA: III  Anesthesia Plan: General   Post-op Pain Management:    Induction: Intravenous  PONV Risk Score and Plan: TIVA  Airway Management Planned: Nasal Cannula and Natural Airway  Additional Equipment:   Intra-op Plan:   Post-operative Plan:   Informed Consent: I have reviewed the patients History and Physical, chart, labs and discussed the procedure including the risks, benefits and alternatives for the proposed anesthesia with the patient or authorized representative who has indicated his/her understanding and acceptance.   Dental Advisory Given  Plan Discussed with: CRNA  Anesthesia Plan Comments:         Anesthesia Quick Evaluation

## 2017-12-03 NOTE — H&P (Signed)
Primary Care Physician:  Clarisse Gouge, MD Primary Gastroenterologist:  Dr. Vira Agar  Pre-Procedure History & Physical: HPI:  Allison Hall is a 77 y.o. female is here for an endoscopy and colonoscopy.  These done for personal history of colon polyps and history of iron def anemia.   Past Medical History:  Diagnosis Date  . Anxiety   . Breast cancer ([place]) 08/24/2016   right  . Cancer ([place])    skin ca  . Colon polyp   . Depression   . GERD (gastroesophageal reflux disease)   . Hypercholesteremia   . Hypertension   . Osteoarthritis   . Personal history of radiation therapy   . Thyroid nodule 04/17/2016    Past Surgical History:  Procedure Laterality Date  . ABDOMINAL HYSTERECTOMY    . APPENDECTOMY    . BREAST BIOPSY Right 08/24/2016   9:30 - invasive mammary carcinoma with lobular features  . BREAST BIOPSY Right 08/24/2016   10:00 - invasive mammary carcinoma with lobular features  . BREAST LUMPECTOMY Right 10/09/2016  . COLONOSCOPY W/ POLYPECTOMY  2003, 2008, 2014  . ESOPHAGOGASTRODUODENOSCOPY     foreign body  . JOINT REPLACEMENT Right 2008   TKR  . NOSE SURGERY    . PARTIAL MASTECTOMY WITH NEEDLE LOCALIZATION Right 09/29/2016   Procedure: PARTIAL MASTECTOMY WITH NEEDLE LOCALIZATION;  Surgeon: [name], MD;  Location: [facility] ORS;  Service: General;  Laterality: Right;  . REPLACEMENT TOTAL KNEE Right 2008  . SENTINEL NODE BIOPSY Right 09/29/2016   Procedure: SENTINEL NODE BIOPSY;  Surgeon: [name], MD;  Location: [facility] ORS;  Service: General;  Laterality: Right;    Prior to Admission medications   Medication Sig Start Date End Date Taking? Authorizing Provider  acetaminophen (TYLENOL) 500 MG tablet Take 500 mg by mouth 2 (two) times daily.   Yes [provider]  amLODipine (NORVASC) 10 MG tablet Take 10 mg by mouth daily. 09/20/17 09/20/18 Yes [provider]  buPROPion (WELLBUTRIN XL) 300 MG 24 hr tablet Take 300 mg by  mouth daily. 10/23/17 10/23/18 Yes [provider]  busPIRone (BUSPAR) 10 MG tablet Take 10 mg by mouth 3 (three) times daily. 04/17/16  Yes [provider]  calcium carbonate 1250 MG capsule Take 1,250 mg by mouth 2 (two) times daily with a meal.   Yes [provider]  Cholecalciferol (VITAMIN D3) 1000 units CAPS Take 1,000 Units by mouth 2 (two) times daily.   Yes [provider]  denosumab (PROLIA) 60 MG/ML SOLN injection Inject 60 mg into the skin every 6 (six) months. Administer in upper arm, thigh, or abdomen   Yes [provider]  exemestane (AROMASIN) 25 MG tablet TAKE 1 TABLET (25 MG TOTAL) BY MOUTH DAILY AFTER BREAKFAST. 07/20/17  Yes Karen Kitchens, NP  lisinopril (PRINIVIL,ZESTRIL) 40 MG tablet Take 40 mg by mouth daily. 09/20/17 09/20/18 Yes [provider]  loperamide (IMODIUM) 2 MG capsule Take 2 mg by mouth as needed for diarrhea or loose stools.    Yes [provider]  Magnesium 250 MG TABS Take 2 tablets by mouth daily.   Yes [provider]  omeprazole (PRILOSEC) 40 MG capsule Take 20 mg by mouth.  10/20/14 12/03/17 Yes [provider]  rosuvastatin (CRESTOR) 5 MG tablet Take 5 mg by mouth daily. 04/17/16 12/03/17 Yes [provider]  sertraline (ZOLOFT) 100 MG tablet Take 1.5 tablets (150 mg total) by mouth daily. Patient taking differently: Take 100-150 mg  by mouth See admin instructions. Takes 100 mg daily except on Monday, Wednesday and Fridays and takes 150 mg (1.5 tablet) 05/19/15 12/03/17 Yes Ravi, Himabindu, MD  Turmeric Curcumin 500 MG CAPS Take 1,500 mg by mouth daily.    Yes [provider]    Allergies as of 11/30/2017 - Review Complete 11/30/2017  Allergen Reaction Noted  . Sulfamethoxazole Other (See Comments) 05/19/2015  . Statins Other (See Comments) 10/11/2011  . Trazodone Other (See Comments) 08/16/2015  . Ciprofloxacin Anxiety 09/21/2016  . Sulfa antibiotics Anxiety  11/29/2014    Family History  Problem Relation Age of Onset  . Hypertension Mother   . Depression Mother   . Heart failure Father   . Obesity Brother   . Heart disease Brother   . Stroke Brother   . Depression Brother   . Breast cancer Cousin   . Breast cancer Cousin   . Breast cancer Cousin     Social History   Socioeconomic History  . Marital status: Married    Spouse name: Not on file  . Number of children: Not on file  . Years of education: Not on file  . Highest education level: Not on file  Occupational History  . Not on file  Social Needs  . Financial resource strain: Not hard at all  . Food insecurity:    Worry: Never true    Inability: Never true  . Transportation needs:    Medical: No    Non-medical: No  Tobacco Use  . Smoking status: Former Smoker    Packs/day: 1.00    Years: 10.00    Pack years: 10.00    Last attempt to quit: 05/18/1965    Years since quitting: 52.5  . Smokeless tobacco: Never Used  Substance and Sexual Activity  . Alcohol use: Yes    Alcohol/week: 7.0 standard drinks    Types: 7 Glasses of wine per week  . Drug use: No  . Sexual activity: Not Currently  Lifestyle  . Physical activity:    Days per week: 0 days    Minutes per session: 0 min  . Stress: Only a little  Relationships  . Social connections:    Talks on phone: Patient refused    Gets together: Patient refused    Attends religious service: Patient refused    Active member of club or organization: Patient refused    Attends meetings of clubs or organizations: Patient refused    Relationship status: Patient refused  . Intimate partner violence:    Fear of current or ex partner: Patient refused    Emotionally abused: Patient refused    Physically abused: Patient refused    Forced sexual activity: Patient refused  Other Topics Concern  . Not on file  Social History Narrative  . Not on file    Review of Systems: See HPI, otherwise negative ROS  Physical  Exam: BP (!) 152/86   Pulse 89   Temp (!) 97.5 F (36.4 C) (Tympanic)   Resp 17   Ht 5\' 5"  (1.651 m)   Wt 93 kg   SpO2 99%   BMI 34.11 kg/m  General:   Alert,  pleasant and cooperative in NAD Head:  Normocephalic and atraumatic. Neck:  Supple; no masses or thyromegaly. Lungs:  Clear throughout to auscultation.    Heart:  Regular rate and rhythm. Abdomen:  Soft, nontender and nondistended. Normal bowel sounds, without guarding, and without rebound.   Neurologic:  Alert and  oriented x4;  grossly normal neurologically.  Impression/Plan: Allison Hall is here for an endoscopy and colonoscopy to be performed for iron def anemia and personal history of colon polyps  Risks, benefits, limitations, and alternatives regarding  endoscopy and colonoscopy have been reviewed with the patient.  Questions have been answered.  All parties agreeable.   Gaylyn Cheers, MD  12/03/2017, 2:31 PM

## 2017-12-03 NOTE — OR Nursing (Signed)
Hot Snare Rectal Polyp - Not Retrieved

## 2017-12-04 ENCOUNTER — Encounter: Payer: Self-pay | Admitting: Unknown Physician Specialty

## 2017-12-04 NOTE — Anesthesia Postprocedure Evaluation (Signed)
Anesthesia Post Note  Patient: Allison Hall  Procedure(s) Performed: COLONOSCOPY WITH PROPOFOL (N/A ) ESOPHAGOGASTRODUODENOSCOPY (EGD) WITH PROPOFOL (N/A )  Patient location during evaluation: Endoscopy Anesthesia Type: General Level of consciousness: awake and alert Pain management: pain level controlled Vital Signs Assessment: post-procedure vital signs reviewed and stable Respiratory status: spontaneous breathing, nonlabored ventilation and respiratory function stable Cardiovascular status: blood pressure returned to baseline and stable Postop Assessment: no apparent nausea or vomiting Anesthetic complications: no     Last Vitals:  Vitals:   12/03/17 1556 12/03/17 1606  BP: (!) 163/68 (!) 155/69  Pulse: 88 96  Resp: (!) 23 16  Temp:    SpO2: 97% 95%    Last Pain:  Vitals:   12/04/17 0752  TempSrc:   PainSc: 0-No pain                 Alphonsus Sias

## 2017-12-05 LAB — SURGICAL PATHOLOGY

## 2017-12-08 ENCOUNTER — Emergency Department: Payer: Medicare HMO

## 2017-12-08 ENCOUNTER — Other Ambulatory Visit: Payer: Self-pay

## 2017-12-08 DIAGNOSIS — X58XXXA Exposure to other specified factors, initial encounter: Secondary | ICD-10-CM | POA: Diagnosis not present

## 2017-12-08 DIAGNOSIS — R0789 Other chest pain: Secondary | ICD-10-CM | POA: Diagnosis not present

## 2017-12-08 DIAGNOSIS — Z79899 Other long term (current) drug therapy: Secondary | ICD-10-CM | POA: Insufficient documentation

## 2017-12-08 DIAGNOSIS — Z96651 Presence of right artificial knee joint: Secondary | ICD-10-CM | POA: Diagnosis not present

## 2017-12-08 DIAGNOSIS — Y999 Unspecified external cause status: Secondary | ICD-10-CM | POA: Diagnosis not present

## 2017-12-08 DIAGNOSIS — Z853 Personal history of malignant neoplasm of breast: Secondary | ICD-10-CM | POA: Diagnosis not present

## 2017-12-08 DIAGNOSIS — I10 Essential (primary) hypertension: Secondary | ICD-10-CM | POA: Diagnosis not present

## 2017-12-08 DIAGNOSIS — T148XXA Other injury of unspecified body region, initial encounter: Secondary | ICD-10-CM | POA: Insufficient documentation

## 2017-12-08 DIAGNOSIS — Z87891 Personal history of nicotine dependence: Secondary | ICD-10-CM | POA: Insufficient documentation

## 2017-12-08 DIAGNOSIS — R079 Chest pain, unspecified: Secondary | ICD-10-CM | POA: Diagnosis present

## 2017-12-08 DIAGNOSIS — Y929 Unspecified place or not applicable: Secondary | ICD-10-CM | POA: Diagnosis not present

## 2017-12-08 DIAGNOSIS — Y939 Activity, unspecified: Secondary | ICD-10-CM | POA: Insufficient documentation

## 2017-12-08 LAB — BASIC METABOLIC PANEL
ANION GAP: 8 (ref 5–15)
BUN: 26 mg/dL — ABNORMAL HIGH (ref 8–23)
CALCIUM: 9.4 mg/dL (ref 8.9–10.3)
CHLORIDE: 107 mmol/L (ref 98–111)
CO2: 24 mmol/L (ref 22–32)
Creatinine, Ser: 1.24 mg/dL — ABNORMAL HIGH (ref 0.44–1.00)
GFR calc non Af Amer: 41 mL/min — ABNORMAL LOW (ref >60)
GFR, EST AFRICAN AMERICAN: 47 mL/min — AB (ref >60)
Glucose, Bld: 131 mg/dL — ABNORMAL HIGH (ref 70–99)
Potassium: 4.1 mmol/L (ref 3.5–5.1)
SODIUM: 139 mmol/L (ref 135–145)

## 2017-12-08 LAB — CBC
HCT: 34.7 % — ABNORMAL LOW (ref 35.0–47.0)
HEMOGLOBIN: 11.7 g/dL — AB (ref 12.0–16.0)
MCH: 29.8 pg (ref 26.0–34.0)
MCHC: 33.8 g/dL (ref 32.0–36.0)
MCV: 88.2 fL (ref 80.0–100.0)
Platelets: 235 10*3/uL (ref 150–440)
RBC: 3.93 MIL/uL (ref 3.80–5.20)
RDW: 18.4 % — ABNORMAL HIGH (ref 11.5–14.5)
WBC: 6.6 10*3/uL (ref 3.6–11.0)

## 2017-12-08 LAB — TROPONIN I: Troponin I: <0.03 ng/mL (ref <0.03)

## 2017-12-08 NOTE — ED Triage Notes (Signed)
Pt states began to have left sided shoulder pain radiating to right neck and chest approx one hour pta. Pt denies shob, nausea, vomiting, dizziness, diaphoresis.

## 2017-12-09 ENCOUNTER — Emergency Department
Admission: EM | Admit: 2017-12-09 | Discharge: 2017-12-09 | Disposition: A | Discharge status: Home / Self Care | Payer: Medicare HMO | Attending: Emergency Medicine | Admitting: Emergency Medicine

## 2017-12-09 DIAGNOSIS — R0789 Other chest pain: Secondary | ICD-10-CM

## 2017-12-09 DIAGNOSIS — T148XXA Other injury of unspecified body region, initial encounter: Secondary | ICD-10-CM

## 2017-12-09 LAB — TROPONIN I: Troponin I: <0.03 ng/mL (ref <0.03)

## 2017-12-09 MED ORDER — DICLOFENAC SODIUM 1 % TD GEL: 4.0000 g | Freq: Four times a day (QID) | TRANSDERMAL | 0 refills | Status: DC | PRN

## 2017-12-09 MED ORDER — LIDOCAINE 5 % EX PTCH
1.0000 | MEDICATED_PATCH | Freq: Once | CUTANEOUS | Status: DC
  Administered 2017-12-09: 1 via TRANSDERMAL
  Filled 2017-12-09: qty 1

## 2017-12-09 NOTE — ED Provider Notes (Signed)
[facility] Emergency Department Provider Note  ____________________________________________   First MD Initiated Contact with Patient 12/09/17 0037     (approximate)  I have reviewed the triage vital signs and the nursing notes.   HISTORY  Chief Complaint Chest Pain   HPI Allison Hall is a 77 y.o. female who comes to the emergency department with left neck pain radiating down her left shoulder to her left upper chest that began about an hour prior to arrival.  No shortness of breath nausea vomiting or diaphoresis.  Symptoms are nonexertional.  Leg swelling.  No history of DVT or pulmonary embolism.  No recent surgery travel or immobilization.  Nothing seems to make the symptoms worse except for lying down.  She is able to range her shoulder without difficulty.  No numbness or weakness.  She became concerned and wanted to do checked that "it was not my heart".  She is taken no medications and nothing seems to be improving her symptoms.  Symptoms came on suddenly.    Past Medical History:  Diagnosis Date  . Anxiety   . Breast cancer ([place]) 08/24/2016   right  . Cancer ([place])    skin ca  . Colon polyp   . Depression   . GERD (gastroesophageal reflux disease)   . Hypercholesteremia   . Hypertension   . Osteoarthritis   . Personal history of radiation therapy   . Thyroid nodule 04/17/2016    Patient Active Problem List   Diagnosis Date Noted  . Iron deficiency anemia 10/31/2017  . Low vitamin B12 level 10/31/2017  . Arthralgia 06/03/2017  . Osteopenia 10/04/2016  . Breast cancer of upper-outer quadrant of right female breast ([place]) 08/24/2016  . Closed fracture of distal end of ulna 06/01/2015  . Triggering of digit 05/27/2014  . Hamstring muscle strain 05/26/2014  . Arthritis of knee, degenerative 03/26/2014  . Long term current use of opiate analgesic 04/21/2013  . Arthritis, degenerative 04/21/2013  . Anxiety 10/05/2011  . Colon polyp  10/05/2011  . Esophageal foreign body 10/05/2011  . Acid reflux 10/05/2011  . BP (high blood pressure) 10/05/2011  . Hypercholesterolemia 10/05/2011    Past Surgical History:  Procedure Laterality Date  . ABDOMINAL HYSTERECTOMY    . APPENDECTOMY    . BREAST BIOPSY Right 08/24/2016   9:30 - invasive mammary carcinoma with lobular features  . BREAST BIOPSY Right 08/24/2016   10:00 - invasive mammary carcinoma with lobular features  . BREAST LUMPECTOMY Right 10/09/2016  . COLONOSCOPY W/ POLYPECTOMY  2003, 2008, 2014  . COLONOSCOPY WITH PROPOFOL N/A 12/03/2017   Procedure: COLONOSCOPY WITH PROPOFOL;  Surgeon: Manya Silvas, MD;  Location: [facility] ENDOSCOPY;  Service: Endoscopy;  Laterality: N/A;  . ESOPHAGOGASTRODUODENOSCOPY     foreign body  . ESOPHAGOGASTRODUODENOSCOPY (EGD) WITH PROPOFOL N/A 12/03/2017   Procedure: ESOPHAGOGASTRODUODENOSCOPY (EGD) WITH PROPOFOL;  Surgeon: Manya Silvas, MD;  Location: [facility] ENDOSCOPY;  Service: Endoscopy;  Laterality: N/A;  . JOINT REPLACEMENT Right 2008   TKR  . NOSE SURGERY    . PARTIAL MASTECTOMY WITH NEEDLE LOCALIZATION Right 09/29/2016   Procedure: PARTIAL MASTECTOMY WITH NEEDLE LOCALIZATION;  Surgeon: [name], MD;  Location: [facility] ORS;  Service: General;  Laterality: Right;  . REPLACEMENT TOTAL KNEE Right 2008  . SENTINEL NODE BIOPSY Right 09/29/2016   Procedure: SENTINEL NODE BIOPSY;  Surgeon: [name], MD;  Location: [facility] ORS;  Service: General;  Laterality: Right;    Prior to Admission medications  Medication Sig Start Date End Date Taking? Authorizing Provider  acetaminophen (TYLENOL) 500 MG tablet Take 500 mg by mouth 2 (two) times daily.    [provider]  amLODipine (NORVASC) 10 MG tablet Take 10 mg by mouth daily. 09/20/17 09/20/18  [provider]  buPROPion (WELLBUTRIN XL) 300 MG 24 hr tablet Take 300 mg by mouth daily. 10/23/17 10/23/18  [provider]  busPIRone (BUSPAR) 10 MG tablet  Take 10 mg by mouth 3 (three) times daily. 04/17/16   [provider]  calcium carbonate 1250 MG capsule Take 1,250 mg by mouth 2 (two) times daily with a meal.    [provider]  Cholecalciferol (VITAMIN D3) 1000 units CAPS Take 1,000 Units by mouth 2 (two) times daily.    [provider]  denosumab (PROLIA) 60 MG/ML SOLN injection Inject 60 mg into the skin every 6 (six) months. Administer in upper arm, thigh, or abdomen    [provider]  diclofenac sodium (VOLTAREN) 1 % GEL Apply 4 g topically 4 (four) times daily as needed (pain). 12/09/17   Darel Hong, MD  exemestane (AROMASIN) 25 MG tablet TAKE 1 TABLET (25 MG TOTAL) BY MOUTH DAILY AFTER BREAKFAST. 07/20/17   Karen Kitchens, NP  lisinopril (PRINIVIL,ZESTRIL) 40 MG tablet Take 40 mg by mouth daily. 09/20/17 09/20/18  [provider]  loperamide (IMODIUM) 2 MG capsule Take 2 mg by mouth as needed for diarrhea or loose stools.     [provider]  Magnesium 250 MG TABS Take 2 tablets by mouth daily.    [provider]  omeprazole (PRILOSEC) 40 MG capsule Take 20 mg by mouth.  10/20/14 12/03/17  [provider]  rosuvastatin (CRESTOR) 5 MG tablet Take 5 mg by mouth daily. 04/17/16 12/03/17  [provider]  sertraline (ZOLOFT) 100 MG tablet Take 1.5 tablets (150 mg total) by mouth daily. Patient taking differently: Take 100-150 mg by mouth See admin instructions. Takes 100 mg daily except on Monday, Wednesday and Fridays and takes 150 mg (1.5 tablet) 05/19/15 12/03/17  Elvin So, MD  Turmeric Curcumin 500 MG CAPS Take 1,500 mg by mouth daily.     [provider]    Allergies Sulfamethoxazole; Statins; Trazodone; Ciprofloxacin; and Sulfa antibiotics  Family History  Problem Relation Age of Onset  . Hypertension Mother   . Depression Mother   . Heart failure Father   . Obesity Brother   . Heart disease Brother   . Stroke Brother   . Depression Brother    . Breast cancer Cousin   . Breast cancer Cousin   . Breast cancer Cousin     Social History Social History   Tobacco Use  . Smoking status: Former Smoker    Packs/day: 1.00    Years: 10.00    Pack years: 10.00    Last attempt to quit: 05/18/1965    Years since quitting: 52.5  . Smokeless tobacco: Never Used  Substance Use Topics  . Alcohol use: Yes    Alcohol/week: 7.0 standard drinks    Types: 7 Glasses of wine per week  . Drug use: No    Review of Systems Constitutional: No fever/chills Eyes: No visual changes. ENT: No sore throat. Cardiovascular: Positive for chest pain. Respiratory: Denies shortness of breath. Gastrointestinal: No abdominal pain.  No nausea, no vomiting.  No diarrhea.  No constipation. Genitourinary: Negative for dysuria. Musculoskeletal: Positive for neck and shoulder pain Skin: Negative for rash. Neurological: Negative for headaches,  focal weakness or numbness.   ____________________________________________   PHYSICAL EXAM:  VITAL SIGNS: ED Triage Vitals [12/08/17 2241]  Enc Vitals Group     BP (!) 149/73     Pulse Rate (!) 104     Resp 20     Temp 98.5 F (36.9 C)     Temp Source Oral     SpO2 96 %     Weight 205 lb (93 kg)     Height 5\' 5"  (1.651 m)     Head Circumference      Peak Flow      Pain Score 4     Pain Loc      Pain Edu?      Excl. in [place]?     Constitutional: Alert and oriented x4 appears somewhat uncomfortable although nontoxic no diaphoresis Eyes: PERRL EOMI. Head: Atraumatic. Nose: No congestion/rhinnorhea. Mouth/Throat: No trismus Neck: No stridor.  No meningismus.  Some tenderness of her left lateral neck paraspinal although no spasm Cardiovascular: Normal rate, regular rhythm. Grossly normal heart sounds.  Good peripheral circulation.  Chest wall stable no crepitus Respiratory: Normal respiratory effort.  No retractions. Lungs CTAB and moving good air Gastrointestinal: Soft nontender Musculoskeletal: No  bony tenderness in her shoulder.  Full range of motion and neurovascularly intact Neurologic:  Normal speech and language. No gross focal neurologic deficits are appreciated. Skin:  Skin is warm, dry and intact. No rash noted. Psychiatric: Mood and affect are normal. Speech and behavior are normal.    ____________________________________________   DIFFERENTIAL includes but not limited to  Acute coronary syndrome, dissection, pulmonary embolism, muscle strain, bursitis ____________________________________________   LABS (all labs ordered are listed, but only abnormal results are displayed)  Labs Reviewed  BASIC METABOLIC PANEL - Abnormal; Notable for the following components:      Result Value   Glucose, Bld 131 (*)    BUN 26 (*)    Creatinine, Ser 1.24 (*)    GFR calc non Af Amer 41 (*)    GFR calc Af Amer 47 (*)    All other components within normal limits  CBC - Abnormal; Notable for the following components:   Hemoglobin 11.7 (*)    HCT 34.7 (*)    RDW 18.4 (*)    All other components within normal limits  TROPONIN I  TROPONIN I    Lab work reviewed by me shows no signs of acute ischemia x2 __________________________________________  EKG  ED ECG REPORT I, Darel Hong, the attending physician, personally viewed and interpreted this ECG.  Date: 12/09/2017 EKG Time:  Rate: 110 Rhythm: normal sinus rhythm QRS Axis: Leftward axis Intervals: normal ST/T Wave abnormalities: normal Narrative Interpretation: no evidence of acute ischemia  ____________________________________________  RADIOLOGY  Chest x-ray reviewed by me with no acute disease ____________________________________________   PROCEDURES  Procedure(s) performed: no  Procedures  Critical Care performed: no  ____________________________________________   INITIAL IMPRESSION / ASSESSMENT AND PLAN / ED COURSE  Pertinent labs & imaging results that were available during my care of the  patient were reviewed by me and considered in my medical decision making (see chart for details).   As part of my medical decision making, I reviewed the following data within the [place] History obtained from family if available, nursing notes, old chart and ekg, as well as notes from prior ED visits.  The patient has a story that is atypical for acute coronary syndrome and her EKG is nonischemic and has  2- troponins.  She feels improved following a Lidoderm patch.  I will prescribe her Voltaren gel for home and refer her back to primary care.  This likely represents muscle strain.      ____________________________________________   FINAL CLINICAL IMPRESSION(S) / ED DIAGNOSES  Final diagnoses:  Atypical chest pain  Muscle strain      NEW MEDICATIONS STARTED DURING THIS VISIT:  Discharge Medication List as of 12/09/2017  2:30 AM    START taking these medications   Details  diclofenac sodium (VOLTAREN) 1 % GEL Apply 4 g topically 4 (four) times daily as needed (pain)., Starting Sun 12/09/2017, Print         Note:  This document was prepared using Dragon voice recognition software and may include unintentional dictation errors.     Darel Hong, MD 12/09/17 2325

## 2017-12-09 NOTE — Discharge Instructions (Signed)
It was a pleasure to take care of you today, and thank you for coming to our emergency department.  If you have any questions or concerns before leaving please ask the nurse to grab me and I'm more than happy to go through your aftercare instructions again.  If you were prescribed any opioid pain medication today such as Norco, Vicodin, Percocet, morphine, hydrocodone, or oxycodone please make sure you do not drive when you are taking this medication as it can alter your ability to drive safely.  If you have any concerns once you are home that you are not improving or are in fact getting worse before you can make it to your follow-up appointment, please do not hesitate to call [phone] and come back for further evaluation.  Darel Hong, MD  Results for orders placed or performed during the hospital encounter of 83/66/29  Basic metabolic panel  Result Value Ref Range   Sodium 139 135 - 145 mmol/L   Potassium 4.1 3.5 - 5.1 mmol/L   Chloride 107 98 - 111 mmol/L   CO2 24 22 - 32 mmol/L   Glucose, Bld 131 (H) 70 - 99 mg/dL   BUN 26 (H) 8 - 23 mg/dL   Creatinine, Ser 1.24 (H) 0.44 - 1.00 mg/dL   Calcium 9.4 8.9 - 10.3 mg/dL   GFR calc non Af Amer 41 (L) >60 mL/min   GFR calc Af Amer 47 (L) >60 mL/min   Anion gap 8 5 - 15  CBC  Result Value Ref Range   WBC 6.6 3.6 - 11.0 K/uL   RBC 3.93 3.80 - 5.20 MIL/uL   Hemoglobin 11.7 (L) 12.0 - 16.0 g/dL   HCT 34.7 (L) 35.0 - 47.0 %   MCV 88.2 80.0 - 100.0 fL   MCH 29.8 26.0 - 34.0 pg   MCHC 33.8 32.0 - 36.0 g/dL   RDW 18.4 (H) 11.5 - 14.5 %   Platelets 235 150 - 440 K/uL  Troponin I  Result Value Ref Range   Troponin I <0.03 <0.03 ng/mL  Troponin I  Result Value Ref Range   Troponin I <0.03 <0.03 ng/mL   Dg Chest 2 View  Result Date: 12/08/2017 CLINICAL DATA:  Patient with left shoulder pain radiating to the neck. EXAM: CHEST - 2 VIEW COMPARISON:  Chest radiograph 08/22/2013. FINDINGS: Stable cardiomegaly. No large area of pulmonary  consolidation. No pleural effusion or pneumothorax. Thoracic spine degenerative changes. IMPRESSION: No acute cardiopulmonary process. Electronically Signed   By: Lovey Newcomer M.D.   On: 12/08/2017 23:21

## 2017-12-12 ENCOUNTER — Inpatient Hospital Stay: Payer: Medicare HMO | Attending: Hematology and Oncology

## 2017-12-12 ENCOUNTER — Telehealth: Payer: Self-pay | Admitting: *Deleted

## 2017-12-12 DIAGNOSIS — D509 Iron deficiency anemia, unspecified: Secondary | ICD-10-CM | POA: Diagnosis present

## 2017-12-12 LAB — CBC WITH DIFFERENTIAL/PLATELET
Basophils Absolute: 0.1 10*3/uL (ref 0–0.1)
Basophils Relative: 1 %
Eosinophils Absolute: 0.3 10*3/uL (ref 0–0.7)
Eosinophils Relative: 4 %
HCT: 36.3 % (ref 35.0–47.0)
Hemoglobin: 12.3 g/dL (ref 12.0–16.0)
Lymphocytes Relative: 22 %
Lymphs Abs: 1.3 10*3/uL (ref 1.0–3.6)
MCH: 30.1 pg (ref 26.0–34.0)
MCHC: 33.8 g/dL (ref 32.0–36.0)
MCV: 89.1 fL (ref 80.0–100.0)
Monocytes Absolute: 0.5 10*3/uL (ref 0.2–0.9)
Monocytes Relative: 9 %
Neutro Abs: 3.9 10*3/uL (ref 1.4–6.5)
Neutrophils Relative %: 64 %
Platelets: 300 10*3/uL (ref 150–440)
RBC: 4.08 MIL/uL (ref 3.80–5.20)
RDW: 17.9 % — ABNORMAL HIGH (ref 11.5–14.5)
WBC: 6.1 10*3/uL (ref 3.6–11.0)

## 2017-12-12 LAB — FERRITIN: Ferritin: 174 ng/mL (ref 11–307)

## 2017-12-12 NOTE — Telephone Encounter (Signed)
Called patient and LVM to inform her that her ferritin is good.

## 2017-12-12 NOTE — Telephone Encounter (Signed)
-----   Message from Lequita Asal, MD sent at 12/12/2017  4:41 PM EDT ----- Regarding: Please call patient  Ferritin looks good.  M  ----- Message ----- From: Interface, Lab In [place] Sent: 12/12/2017   1:29 PM EDT To: Lequita Asal, MD

## 2017-12-26 ENCOUNTER — Ambulatory Visit
Payer: Medicare HMO | Attending: Student in an Organized Health Care Education/Training Program | Admitting: Student in an Organized Health Care Education/Training Program

## 2017-12-26 ENCOUNTER — Other Ambulatory Visit: Payer: Self-pay

## 2017-12-26 ENCOUNTER — Encounter: Payer: Self-pay | Admitting: Student in an Organized Health Care Education/Training Program

## 2017-12-26 VITALS — BP 130/74 | HR 87 | Temp 98.6°F | Ht 65.0 in | Wt 205.0 lb

## 2017-12-26 DIAGNOSIS — I10 Essential (primary) hypertension: Secondary | ICD-10-CM | POA: Insufficient documentation

## 2017-12-26 DIAGNOSIS — M25562 Pain in left knee: Secondary | ICD-10-CM | POA: Insufficient documentation

## 2017-12-26 DIAGNOSIS — G894 Chronic pain syndrome: Secondary | ICD-10-CM | POA: Insufficient documentation

## 2017-12-26 DIAGNOSIS — M1712 Unilateral primary osteoarthritis, left knee: Secondary | ICD-10-CM | POA: Insufficient documentation

## 2017-12-26 DIAGNOSIS — E7800 Pure hypercholesterolemia, unspecified: Secondary | ICD-10-CM | POA: Diagnosis not present

## 2017-12-26 DIAGNOSIS — Z8249 Family history of ischemic heart disease and other diseases of the circulatory system: Secondary | ICD-10-CM | POA: Diagnosis not present

## 2017-12-26 DIAGNOSIS — Z888 Allergy status to other drugs, medicaments and biological substances status: Secondary | ICD-10-CM | POA: Insufficient documentation

## 2017-12-26 DIAGNOSIS — Z96651 Presence of right artificial knee joint: Secondary | ICD-10-CM | POA: Insufficient documentation

## 2017-12-26 DIAGNOSIS — Z79811 Long term (current) use of aromatase inhibitors: Secondary | ICD-10-CM | POA: Diagnosis not present

## 2017-12-26 DIAGNOSIS — Z87891 Personal history of nicotine dependence: Secondary | ICD-10-CM | POA: Insufficient documentation

## 2017-12-26 DIAGNOSIS — F419 Anxiety disorder, unspecified: Secondary | ICD-10-CM | POA: Diagnosis not present

## 2017-12-26 DIAGNOSIS — Z79899 Other long term (current) drug therapy: Secondary | ICD-10-CM | POA: Diagnosis not present

## 2017-12-26 DIAGNOSIS — K219 Gastro-esophageal reflux disease without esophagitis: Secondary | ICD-10-CM | POA: Insufficient documentation

## 2017-12-26 DIAGNOSIS — Z923 Personal history of irradiation: Secondary | ICD-10-CM | POA: Insufficient documentation

## 2017-12-26 DIAGNOSIS — Z881 Allergy status to other antibiotic agents status: Secondary | ICD-10-CM | POA: Insufficient documentation

## 2017-12-26 DIAGNOSIS — M8580 Other specified disorders of bone density and structure, unspecified site: Secondary | ICD-10-CM | POA: Diagnosis not present

## 2017-12-26 DIAGNOSIS — Z823 Family history of stroke: Secondary | ICD-10-CM | POA: Diagnosis not present

## 2017-12-26 DIAGNOSIS — Z79891 Long term (current) use of opiate analgesic: Secondary | ICD-10-CM | POA: Insufficient documentation

## 2017-12-26 DIAGNOSIS — Z818 Family history of other mental and behavioral disorders: Secondary | ICD-10-CM | POA: Insufficient documentation

## 2017-12-26 DIAGNOSIS — Z882 Allergy status to sulfonamides status: Secondary | ICD-10-CM | POA: Insufficient documentation

## 2017-12-26 DIAGNOSIS — G8929 Other chronic pain: Secondary | ICD-10-CM

## 2017-12-26 DIAGNOSIS — Z86010 Personal history of colonic polyps: Secondary | ICD-10-CM | POA: Diagnosis not present

## 2017-12-26 DIAGNOSIS — C50411 Malignant neoplasm of upper-outer quadrant of right female breast: Secondary | ICD-10-CM | POA: Insufficient documentation

## 2017-12-26 NOTE — Progress Notes (Signed)
Patient's Name: Allison Hall  MRN: [id number]  Referring Provider: Leanor Kail, MD  DOB: 1940/07/23  PCP: Clarisse Gouge, MD  DOS: 12/26/2017  Note by: Gillis Santa, MD  Service setting: Ambulatory outpatient  Specialty: Interventional Pain Management  Location: [facility] (AMB) Pain Management Facility  Visit type: Initial Patient Evaluation  Patient type: New Patient   Primary Reason(s) for Visit: Encounter for initial evaluation of one or more chronic problems (new to examiner) potentially causing chronic pain, and posing a threat to normal musculoskeletal function. (Level of risk: High) CC: Knee Pain (left)  HPI  [name] is a 77 y.o. year old, female patient, who comes today to see [place] for the first time for an initial evaluation of her chronic pain. She has Anxiety; Colon polyp; Esophageal foreign body; Acid reflux; BP (high blood pressure); Hypercholesterolemia; Long term current use of opiate analgesic; Arthritis, degenerative; Arthritis of knee, degenerative; Hamstring muscle strain; Triggering of digit; Closed fracture of distal end of ulna; Breast cancer of upper-outer quadrant of right female breast ([place]); Osteopenia; Arthralgia; Iron deficiency anemia; and Low vitamin B12 level on their problem list. Today she comes in for evaluation of her Knee Pain (left)  Pain Assessment: Location: Left Knee Radiating: medication, rest, Onset:  Pretty much all day Duration: Chronic pain Quality:  Throbbing, aching Severity: 0-No pain/10 (subjective, self-reported pain score)  Note: Reported level is compatible with observation.                         When using our objective Pain Scale, levels between 6 and 10/10 are said to belong in an emergency room, as it progressively worsens from a 6/10, described as severely limiting, requiring emergency care not usually available at an outpatient pain management facility. At a 6/10 level, communication becomes difficult and requires great  effort. Assistance to reach the emergency department may be required. Facial flushing and profuse sweating along with potentially dangerous increases in heart rate and blood pressure will be evident. Effect on ADL:  Limits ability to ambulate Timing:  Worse in the evening Modifying factors: NSAID medication, rest, steriod injections BP: 130/74  HR: 87  Onset and Duration: Date of onset: 2008 Cause of pain: Unknown Severity: NAS-11 at its worse: 4/10, NAS-11 at its best: 0/10, NAS-11 now: 0/10 and NAS-11 on the average: 4/10 Timing: Not influenced by the time of the day and After a period of immobility Aggravating Factors: Bending, Kneeling, Squatting, Twisting, Walking uphill and Walking downhill Alleviating Factors: Medications and Resting Associated Problems: Depression, Fatigue and Swelling Quality of Pain: Aching, Pressure-like and Throbbing Previous Examinations or Tests: Bone scan, CT scan, Endoscopy, MRI scan, X-rays and Orthopedic evaluation Previous Treatments: Epidural steroid injections, Pool exercises and Trigger point injections  The patient comes into the clinics today for the first time for a chronic pain management evaluation.  Very pleasant 77 year old female who presents with a chief complaint of left knee pain.  Patient is status post intra-articular left knee steroid injection on 12/12/2017 with Dr. Jefm Bryant which is providing her with ongoing pain relief at this time.  She states that her current knee pain is 0. Patient does have a history of tricompartmental arthritis that is very severe.  She has a history of a right total knee replacement which she has done well with.  Patient will ultimately need a left total knee arthroplasty.  She is sent here for consideration of left knee genicular nerve block.  Meds   Current Outpatient Medications:  .  acetaminophen (TYLENOL) 500 MG tablet, Take 500 mg by mouth 2 (two) times daily., Disp: , Rfl:  .  amLODipine (NORVASC) 10  MG tablet, Take 10 mg by mouth daily., Disp: , Rfl:  .  buPROPion (WELLBUTRIN XL) 300 MG 24 hr tablet, Take 300 mg by mouth daily., Disp: , Rfl:  .  busPIRone (BUSPAR) 10 MG tablet, Take 10 mg by mouth 3 (three) times daily., Disp: , Rfl:  .  calcium carbonate 1250 MG capsule, Take 1,250 mg by mouth 2 (two) times daily with a meal., Disp: , Rfl:  .  Cholecalciferol (VITAMIN D3) 1000 units CAPS, Take 1,000 Units by mouth 2 (two) times daily., Disp: , Rfl:  .  denosumab (PROLIA) 60 MG/ML SOLN injection, Inject 60 mg into the skin every 6 (six) months. Administer in upper arm, thigh, or abdomen, Disp: , Rfl:  .  exemestane (AROMASIN) 25 MG tablet, TAKE 1 TABLET (25 MG TOTAL) BY MOUTH DAILY AFTER BREAKFAST., Disp: 90 tablet, Rfl: 3 .  lisinopril (PRINIVIL,ZESTRIL) 40 MG tablet, Take 40 mg by mouth daily., Disp: , Rfl:  .  loperamide (IMODIUM) 2 MG capsule, Take 2 mg by mouth as needed for diarrhea or loose stools. , Disp: , Rfl:  .  Magnesium 250 MG TABS, Take 2 tablets by mouth daily., Disp: , Rfl:  .  omeprazole (PRILOSEC) 40 MG capsule, Take 20 mg by mouth. , Disp: , Rfl:  .  rosuvastatin (CRESTOR) 5 MG tablet, Take 5 mg by mouth daily., Disp: , Rfl:  .  sertraline (ZOLOFT) 100 MG tablet, Take 1.5 tablets (150 mg total) by mouth daily. (Patient taking differently: Take 100-150 mg by mouth See admin instructions. Takes 100 mg daily except on Monday, Wednesday and Fridays and takes 150 mg (1.5 tablet)), Disp: 45 tablet, Rfl: 1 .  Turmeric Curcumin 500 MG CAPS, Take 1,500 mg by mouth daily. , Disp: , Rfl:  .  diclofenac sodium (VOLTAREN) 1 % GEL, Apply 4 g topically 4 (four) times daily as needed (pain). (Patient not taking: Reported on 12/26/2017), Disp: 100 g, Rfl: 0  Imaging Review   Results for orders placed during the hospital encounter of 02/28/17  DG Hip Unilat With Pelvis 2-3 Views Right   Narrative CLINICAL DATA:  Posterior hip pain on the right beginning yesterday morning.  EXAM: DG HIP  (WITH OR WITHOUT PELVIS) 2-3V RIGHT  COMPARISON:  None.  FINDINGS: No evidence of hip or pelvic ring fracture. Osteopenia. No degenerative hip narrowing or spurring. L4-5 disc degeneration with advanced asymmetric right height loss and endplate degeneration.  IMPRESSION: 1. No acute finding. 2. 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 3. Osteopenia.   Electronically Signed   By: Monte Fantasia M.D.   On: 02/28/2017 10:43     Ankle Imaging: Ankle-R DG Complete:  Results for orders placed during the hospital encounter of 10/29/16  DG Ankle Complete Right   Narrative CLINICAL DATA:  Fall this morning with lateral foot and ankle pain.  EXAM: RIGHT ANKLE - COMPLETE 3+ VIEW  COMPARISON:  Right foot 10/29/2016  FINDINGS: Lateral soft tissue swelling. Nondisplaced fracture at the base of the fifth metatarsal bone. The ankle is located. Prominent calcaneal spurs.  IMPRESSION: Nondisplaced fracture at the base of the fifth metatarsal bone.  Lateral soft tissue swelling in the right ankle.  No ankle fracture.   Electronically Signed   By: Markus Daft M.D.   On: 10/29/2016 12:57  Foot Imaging: Foot-R DG Complete:  Results for orders placed during the hospital encounter of 10/29/16  DG Foot Complete Right   Narrative CLINICAL DATA:  PT with lateral right foot and ankle pain after fall this morning. No hx of previous injury or trauma.  EXAM: RIGHT FOOT COMPLETE - 3+ VIEW  COMPARISON:  None.  FINDINGS: Osteopenia limits characterization of osseous detail. There is a questionable nondisplaced fracture at the base of the fifth metatarsal bone, only seen on the lateral view. Remainder the osseous structures appear intact and normally aligned.  IMPRESSION: Probable nondisplaced fracture at the base of the fifth metatarsal bone.   Electronically Signed   By: Franki Cabot M.D.   On: 10/29/2016 12:55     Complexity Note: Imaging results reviewed. Results shared  with Ms. Fatica, using Layman's terms.                         ROS  Cardiovascular: High blood pressure Pulmonary or Respiratory: No reported pulmonary signs or symptoms such as wheezing and difficulty taking a deep full breath (Asthma), difficulty blowing air out (Emphysema), coughing up mucus (Bronchitis), persistent dry cough, or temporary stoppage of breathing during sleep Neurological: No reported neurological signs or symptoms such as seizures, abnormal skin sensations, urinary and/or fecal incontinence, being born with an abnormal open spine and/or a tethered spinal cord Review of Past Neurological Studies:  Results for orders placed or performed during the hospital encounter of 12/02/14  MR Brain W Wo Contrast   Narrative   CLINICAL DATA:  Vertigo, ataxia, and blurry vision since 11/25/2014. Fall on 11/27/2014 without reported head injury.  EXAM: MRI HEAD WITHOUT AND WITH CONTRAST  TECHNIQUE: Multiplanar, multiecho pulse sequences of the brain and surrounding structures were obtained without and with intravenous contrast.  CONTRAST:  59m MULTIHANCE GADOBENATE DIMEGLUMINE 529 MG/ML IV SOLN  COMPARISON:  Head CT 11/29/2014  FINDINGS: The study is mildly motion degraded.  There is no evidence of acute infarct, intracranial hemorrhage, mass, midline shift, or extra-axial fluid collection. Ventricles and sulci are normal for age. Small foci of T2 hyperintensity throughout the subcortical and deep cerebral white matter bilaterally are nonspecific but compatible with mild chronic small vessel ischemic disease. No abnormal enhancement is identified.  Globes are grossly intact. Mild depression of the left orbital floor may reflect an old fracture. Paranasal sinuses and mastoid air cells are clear. Major intracranial vascular flow voids are preserved.  IMPRESSION: 1. No acute intracranial abnormality or mass. 2. Mild chronic small vessel ischemic  disease.   Electronically Signed   By: ALogan BoresM.D.   On: 12/02/2014 17:31   Results for orders placed or performed during the hospital encounter of 11/29/14  CT Head Wo Contrast   Narrative   CLINICAL DATA:  No known injury, no surgery, severe dizziness and off balance for 2 days  EXAM: CT HEAD WITHOUT CONTRAST  TECHNIQUE: Contiguous axial images were obtained from the base of the skull through the vertex without intravenous contrast.  COMPARISON:  None.  FINDINGS: There is no evidence of mass effect, midline shift, or extra-axial fluid collections. There is no evidence of a space-occupying lesion or intracranial hemorrhage. There is no evidence of a cortical-based area of acute infarction. There is periventricular white matter low attenuation likely secondary to microangiopathy.  The ventricles and sulci are appropriate for the patient's age. The basal cisterns are patent.  Visualized portions of the orbits are unremarkable. The visualized  portions of the paranasal sinuses and mastoid air cells are unremarkable.  The osseous structures are unremarkable.  IMPRESSION: 1. No acute intracranial pathology. 2. Chronic microvascular disease.   Electronically Signed   By: Kathreen Devoid   On: 11/29/2014 12:38    Psychological-Psychiatric: Depressed Gastrointestinal: Reflux or heatburn Genitourinary: No reported renal or genitourinary signs or symptoms such as difficulty voiding or producing urine, peeing blood, non-functioning kidney, kidney stones, difficulty emptying the bladder, difficulty controlling the flow of urine, or chronic kidney disease Hematological: Weakness due to low blood hemoglobin or red blood cell count (Anemia) Endocrine: No reported endocrine signs or symptoms such as high or low blood sugar, rapid heart rate due to high thyroid levels, obesity or weight gain due to slow thyroid or thyroid disease Rheumatologic: No reported rheumatological signs  and symptoms such as fatigue, joint pain, tenderness, swelling, redness, heat, stiffness, decreased range of motion, with or without associated rash Musculoskeletal: Negative for myasthenia gravis, muscular dystrophy, multiple sclerosis or malignant hyperthermia Work History: Retired  Allergies  Ms. Kotz is allergic to sulfamethoxazole; statins; trazodone; ciprofloxacin; and sulfa antibiotics.  Laboratory Chemistry  Inflammation Markers (CRP: Acute Phase) (ESR: Chronic Phase) No results found for: CRP, ESRSEDRATE, LATICACIDVEN                       Rheumatology Markers No results found for: RF, ANA, LABURIC, URICUR, LYMEIGGIGMAB, LYMEABIGMQN, HLAB27                      Renal Function Markers Lab Results  Component Value Date   BUN 26 (H) 12/08/2017   CREATININE 1.24 (H) 12/08/2017   GFRAA 47 (L) 12/08/2017   GFRNONAA 41 (L) 12/08/2017                             Hepatic Function Markers Lab Results  Component Value Date   AST 23 08/29/2017   ALT 17 08/29/2017   ALBUMIN 4.8 08/29/2017   ALKPHOS 55 08/29/2017                        Electrolytes Lab Results  Component Value Date   NA 139 12/08/2017   K 4.1 12/08/2017   CL 107 12/08/2017   CALCIUM 9.4 12/08/2017                        Neuropathy Markers Lab Results  Component Value Date   VITAMINB12 263 08/29/2017   FOLATE 8.4 08/29/2017                        CNS Tests No results found for: COLORCSF, APPEARCSF, RBCCOUNTCSF, WBCCSF, POLYSCSF, LYMPHSCSF, EOSCSF, PROTEINCSF, GLUCCSF, JCVIRUS, CSFOLI, IGGCSF                      Bone Pathology Markers No results found for: [place], VD125OH2TOT, [id number], [id number], 25OHVITD1, 25OHVITD2, 25OHVITD3, TESTOFREE, TESTOSTERONE                       Coagulation Parameters Lab Results  Component Value Date   PLT 300 12/12/2017                        Cardiovascular Markers Lab Results  Component Value Date   TROPONINI <0.03 12/09/2017   HGB 12.3  12/12/2017    HCT 36.3 12/12/2017                         CA Markers Lab Results  Component Value Date   LABCA2 13.4 09/06/2016                        Note: Lab results reviewed.  [place]  Drug: Ms. Deveny  reports that she does not use drugs. Alcohol:  reports that she drinks about 7.0 standard drinks of alcohol per week. Tobacco:  reports that she quit smoking about 52 years ago. She has a 10.00 pack-year smoking history. She has never used smokeless tobacco. Medical:  has a past medical history of Anxiety, Breast cancer ([place]) (08/24/2016), Cancer [facility]), Colon polyp, Depression, GERD (gastroesophageal reflux disease), Hypercholesteremia, Hypertension, Osteoarthritis, Personal history of radiation therapy, and Thyroid nodule (04/17/2016). Family: family history includes Breast cancer in her cousin, cousin, and cousin; Depression in her brother and mother; Heart disease in her brother; Heart failure in her father; Hypertension in her mother; Obesity in her brother; Stroke in her brother.  Past Surgical History:  Procedure Laterality Date  . ABDOMINAL HYSTERECTOMY    . APPENDECTOMY    . BREAST BIOPSY Right 08/24/2016   9:30 - invasive mammary carcinoma with lobular features  . BREAST BIOPSY Right 08/24/2016   10:00 - invasive mammary carcinoma with lobular features  . BREAST LUMPECTOMY Right 10/09/2016  . COLONOSCOPY W/ POLYPECTOMY  2003, 2008, 2014  . COLONOSCOPY WITH PROPOFOL N/A 12/03/2017   Procedure: COLONOSCOPY WITH PROPOFOL;  Surgeon: Manya Silvas, MD;  Location: [facility] ENDOSCOPY;  Service: Endoscopy;  Laterality: N/A;  . ESOPHAGOGASTRODUODENOSCOPY     foreign body  . ESOPHAGOGASTRODUODENOSCOPY (EGD) WITH PROPOFOL N/A 12/03/2017   Procedure: ESOPHAGOGASTRODUODENOSCOPY (EGD) WITH PROPOFOL;  Surgeon: Manya Silvas, MD;  Location: [facility] ENDOSCOPY;  Service: Endoscopy;  Laterality: N/A;  . JOINT REPLACEMENT Right 2008   TKR  . NOSE SURGERY    . PARTIAL MASTECTOMY WITH NEEDLE LOCALIZATION  Right 09/29/2016   Procedure: PARTIAL MASTECTOMY WITH NEEDLE LOCALIZATION;  Surgeon: [name], MD;  Location: [facility] ORS;  Service: General;  Laterality: Right;  . REPLACEMENT TOTAL KNEE Right 2008  . SENTINEL NODE BIOPSY Right 09/29/2016   Procedure: SENTINEL NODE BIOPSY;  Surgeon: [name], MD;  Location: [facility] ORS;  Service: General;  Laterality: Right;   Active Ambulatory Problems    Diagnosis Date Noted  . Anxiety 10/05/2011  . Colon polyp 10/05/2011  . Esophageal foreign body 10/05/2011  . Acid reflux 10/05/2011  . BP (high blood pressure) 10/05/2011  . Hypercholesterolemia 10/05/2011  . Long term current use of opiate analgesic 04/21/2013  . Arthritis, degenerative 04/21/2013  . Arthritis of knee, degenerative 03/26/2014  . Hamstring muscle strain 05/26/2014  . Triggering of digit 05/27/2014  . Closed fracture of distal end of ulna 06/01/2015  . Breast cancer of upper-outer quadrant of right female breast ([place]) 08/24/2016  . Osteopenia 10/04/2016  . Arthralgia 06/03/2017  . Iron deficiency anemia 10/31/2017  . Low vitamin B12 level 10/31/2017   Resolved Ambulatory Problems    Diagnosis Date Noted  . No Resolved Ambulatory Problems   Past Medical History:  Diagnosis Date  . Breast cancer ([place]) 08/24/2016  . Cancer ([place])   . Depression   . GERD (gastroesophageal reflux disease)   . Hypercholesteremia   . Hypertension   . Osteoarthritis   . Personal history  of radiation therapy   . Thyroid nodule 04/17/2016   Constitutional Exam  General appearance: Well nourished, well developed, and well hydrated. In no apparent acute distress Vitals:   12/26/17 1404  BP: 130/74  Pulse: 87  Temp: 98.6 F (37 C)  TempSrc: Oral  SpO2: 97%  Weight: 205 lb (93 kg)  Height: 5' 5" (1.651 m)   BMI Assessment: Estimated body mass index is 34.11 kg/m as calculated from the following:   Height as of this encounter: 5' 5" (1.651 m).   Weight as of this encounter:  205 lb (93 kg).  BMI interpretation table: BMI level Category Range association with higher incidence of chronic pain  <18 kg/m2 Underweight   18.5-24.9 kg/m2 Ideal body weight   25-29.9 kg/m2 Overweight Increased incidence by 20%  30-34.9 kg/m2 Obese (Class I) Increased incidence by 68%  35-39.9 kg/m2 Severe obesity (Class II) Increased incidence by 136%  >40 kg/m2 Extreme obesity (Class III) Increased incidence by 254%   Patient's current BMI Ideal Body weight  Body mass index is 34.11 kg/m. Ideal body weight: 57 kg (125 lb 10.6 oz) Adjusted ideal body weight: 71.4 kg (157 lb 6.4 oz)   BMI Readings from Last 4 Encounters:  12/26/17 34.11 kg/m  12/08/17 34.11 kg/m  12/03/17 34.11 kg/m  10/31/17 34.05 kg/m   Wt Readings from Last 4 Encounters:  12/26/17 205 lb (93 kg)  12/08/17 205 lb (93 kg)  12/03/17 205 lb (93 kg)  10/31/17 204 lb 9.4 oz (92.8 kg)  Psych/Mental status: Alert, oriented x 3 (person, place, & time)       Eyes: PERLA Respiratory: No evidence of acute respiratory distress  Cervical Spine Area Exam  Skin & Axial Inspection: No masses, redness, edema, swelling, or associated skin lesions Alignment: Symmetrical Functional ROM: Unrestricted ROM      Stability: No instability detected Muscle Tone/Strength: Functionally intact. No obvious neuro-muscular anomalies detected. Sensory (Neurological): Unimpaired Palpation: No palpable anomalies              Upper Extremity (UE) Exam    Side: Right upper extremity  Side: Left upper extremity  Skin & Extremity Inspection: Skin color, temperature, and hair growth are WNL. No peripheral edema or cyanosis. No masses, redness, swelling, asymmetry, or associated skin lesions. No contractures.  Skin & Extremity Inspection: Skin color, temperature, and hair growth are WNL. No peripheral edema or cyanosis. No masses, redness, swelling, asymmetry, or associated skin lesions. No contractures.  Functional ROM: Unrestricted ROM           Functional ROM: Unrestricted ROM          Muscle Tone/Strength: Functionally intact. No obvious neuro-muscular anomalies detected.  Muscle Tone/Strength: Functionally intact. No obvious neuro-muscular anomalies detected.  Sensory (Neurological): Unimpaired          Sensory (Neurological): Unimpaired          Palpation: No palpable anomalies              Palpation: No palpable anomalies              Provocative Test(s):  Phalen's test: deferred Tinel's test: deferred Apley's scratch test (touch opposite shoulder):  Action 1 (Across chest): deferred Action 2 (Overhead): deferred Action 3 (LB reach): deferred   Provocative Test(s):  Phalen's test: deferred Tinel's test: deferred Apley's scratch test (touch opposite shoulder):  Action 1 (Across chest): deferred Action 2 (Overhead): deferred Action 3 (LB reach): deferred    Thoracic Spine Area Exam  Skin & Axial Inspection: No masses, redness, or swelling Alignment: Symmetrical Functional ROM: Unrestricted ROM Stability: No instability detected Muscle Tone/Strength: Functionally intact. No obvious neuro-muscular anomalies detected. Sensory (Neurological): Unimpaired Muscle strength & Tone: No palpable anomalies  Lumbar Spine Area Exam  Skin & Axial Inspection: No masses, redness, or swelling Alignment: Symmetrical Functional ROM: Unrestricted ROM       Stability: No instability detected Muscle Tone/Strength: Functionally intact. No obvious neuro-muscular anomalies detected. Sensory (Neurological): Unimpaired Palpation: No palpable anomalies       Provocative Tests: Hyperextension/rotation test: deferred today       Lumbar quadrant test (Kemp's test): deferred today       Lateral bending test: deferred today       Patrick's Maneuver: deferred today                   FABER test: deferred today                   S-I anterior distraction/compression test: deferred today         S-I lateral compression test: deferred today          S-I Thigh-thrust test: deferred today         S-I Gaenslen's test: deferred today          Gait & Posture Assessment  Ambulation: Unassisted Gait: Relatively normal for age and body habitus Posture: WNL   Lower Extremity Exam    Side: Right lower extremity  Side: Left lower extremity  Stability: No instability observed          Stability: No instability observed          Skin & Extremity Inspection: Evidence of prior arthroplastic surgery  Skin & Extremity Inspection: Skin color, temperature, and hair growth are WNL. No peripheral edema or cyanosis. No masses, redness, swelling, asymmetry, or associated skin lesions. No contractures.  Functional ROM: Unrestricted ROM                  Functional ROM: Decreased ROM for hip and knee joints          Muscle Tone/Strength: Functionally intact. No obvious neuro-muscular anomalies detected.  Muscle Tone/Strength: Functionally intact. No obvious neuro-muscular anomalies detected.  Sensory (Neurological): Unimpaired  Sensory (Neurological): Arthropathic arthralgia  Palpation: No palpable anomalies  Palpation: No palpable anomalies   Assessment  Primary Diagnosis & Pertinent Problem List: The primary encounter diagnosis was Primary osteoarthritis of left knee. Diagnoses of Chronic pain of left knee and Chronic pain syndrome were also pertinent to this visit.  Visit Diagnosis (New problems to examiner): 1. Primary osteoarthritis of left knee   2. Chronic pain of left knee   3. Chronic pain syndrome    General Recommendations: The pain condition that the patient suffers from is best treated with a multidisciplinary approach that involves an increase in physical activity to prevent de-conditioning and worsening of the pain cycle, as well as psychological counseling (formal and/or informal) to address the co-morbid psychological affects of pain. Treatment will often involve judicious use of pain medications and interventional procedures to  decrease the pain, allowing the patient to participate in the physical activity that will ultimately produce long-lasting pain reductions. The goal of the multidisciplinary approach is to return the patient to a higher level of overall function and to restore their ability to perform activities of daily living.  Very pleasant 77 year old female who presents with a chief complaint of left knee pain.  Patient is  status post intra-articular left knee steroid injection on 12/12/2017 with Dr. Jefm Bryant which is providing her with ongoing pain relief at this time.  She states that her current knee pain is 0. Patient does have a history of tricompartmental arthritis that is very severe.  She has a history of a right total knee replacement which she has done well with.  Patient will ultimately need a left total knee arthroplasty.  She is sent here for consideration of left knee genicular nerve block.  After evaluation of the patient, patient will be a candidate for left knee genicular nerve block.  This procedure was discussed with the patient in great detail.  Risks and benefits were discussed.  At this time given that the patient is having no pain in her left knee, we can make this a PRN procedure.  I instructed the patient to give [place] a call when she starts having return of her knee pain and at that time we can get her scheduled for a diagnostic left knee genicular nerve block.  I also discussed potential Coolief radiofrequency ablation if we obtain good results with a diagnostic blocks.  All questions or concerns were answered/addressed.  Plan: -PRN left knee genicular nerve block without sedation under fluoroscopy.  Ordered Lab-work, Procedure(s), Referral(s), & Consult(s): Orders Placed This Encounter  Procedures  . GENICULAR NERVE BLOCK   Provider-requested follow-up: Return if symptoms worsen or fail to improve.  Future Appointments  Date Time Provider [place]  01/02/2018  2:30 PM Noreene Filbert, MD [facility] None  01/29/2018  1:30 PM [facility] LAB [facility] None  01/30/2018  1:00 PM Lequita Asal, MD [facility] None  01/30/2018  1:15 PM [facility] INFUSION CHAIR 3 [facility] None    Primary Care Physician: Clarisse Gouge, MD Location: [facility] Note by: Gillis Santa, M.D, Date: 12/26/2017; Time: 3:08 PM  Patient Instructions   ____________________________________________________________________________________________  Genicular Nerve Block  What is a genicular nerve block? A genicular nerve block is the injection of a local anesthetic to block the nerves that transmits pain from the knee.  What is the purpose of a facet nerve block? A genicular nerve block is a diagnostic procedure to determine if the pathologic changes (i.e. arthritis, meniscal tears, etc) and inflammation within the knee joint is the source of your knee pain. It also confirms that the knee pain will respond well to the actual treatment procedure. If a genicular nerve block works, it will give you relief for several hours. After that, the pain is expected to return to normal. This test is always performed twice (usually a week or two apart) because two successful tests are required to move onto treatment. If both diagnostic tests are positive, then we schedule a treatment called radiofrequency (RF) ablation. In this procedure, the same nerves are cauterized, which typically leads to pain relief for 4 -18 months. If this process works well for one knee, it can be performed on the other knee if needed.  How is the procedure performed? You will be placed on the procedure table. The injection site is sterilized with either iodine or chlorhexadine. The site to be injected is numbed with a local anesthetic, and a needle is directed to the target area. X-ray guidance is used to ensure proper placement and positioning of the needle. When the needle is properly positioned near the  genicular nerve, local anesthetic is injected to numb that nerve. This will be repeated at multiple sites around the knee to  block all genicular nerves.  Will the procedure be painful? The injection can be painful and we therefore provide the option of receiving IV sedation. IV sedation, combined with local anesthetic, can make the injection nearly pain free. It allows you to remain very still during the procedure, which can also make the injection easier, faster, and more successful. If you decide to have IV sedation, you must have a driver to get you home safely afterwards. In addition, you cannot have anything to eat or drink within 8 hours of your appointment (clear liquids are allowed until 3 hours before the procedure). If you take medications for diabetes, these medications may need to be adjusted the morning of the procedure. Your primary care physician can help you with this adjustment.  What are the discharge instructions? If you received IV sedation do not drive or operate machinery for at least 24 hours after the procedure. You may return to work the next day following your procedure. You may resume your normal diet immediately. Do not engage in any strenuous activity for 24 hours. You should, however, engage in moderate activity that typically causes your ususal pain. If the block works, those activities should not be painful for several hours after the injection. Do not take a bath, swim, or use a hot tub for 24 hours (you may take a shower). Call the office if you have any of the following: severe pain afterwards (different than your usual symptoms), redness/swelling/discharge at the injection site(s), fevers/chills, difficulty with bowel or bladder functions.  What are the risks and side effects? The complication rate for this procedure is very low. Whenever a needle enters the skin, bleeding or infection can occur. Some other serious but extremely rare risks include paralysis and  death. You may have an allergic reaction to any of the medications used. If you have a known allergy to any medications, especially local anesthetics, notify our staff before the procedure takes place. You may experience any of the following side effects up to 4 - 6 hours after the procedure: . Leg muscle weakness or numbness may occur due to the local anesthetic affecting the nerves that control your legs (this is a temporary affect and it is not paralysis). If you have any leg weakness or numbness, walk only with assistance in order to prevent falls and injury. Your leg strength will return slowly and completely. . Dizziness may occur due to a decrease in your blood pressure. If this occurs, remain in a seated or lying position. Gradually sit up, and then stand after at least 10 minutes of sitting. . Mild headaches may occur. Drink fluids and take pain medications if needed. If the headaches persist or become severe, call the office. . Mild discomfort at the injection site can occur. This typically lasts for a few hours but can persist for a couple days. If this occurs, take anti-inflammatories or pain medications, apply ice to the area the day of the procedure. If it persists, apply moist heat in the day(s) following.  The side effects listed above can be normal. They are not dangerous and will resolve on their own. If, however, you experience any of the following, a complication may have occurred and you should either contact your doctor. If he is not readily available, then you should proceed to the closest urgent care center for evaluation: . Severe or progressive pain at the injection site(s) . Arm or leg weakness that progressively worsens or persists for longer than 8 hours .  Severe or progressive redness, swelling, or discharge from the injections site(s) . Fevers, chills, nausea, or vomiting . Bowel or bladder dysfunction (i.e. inability to urinate or pass stool or difficulty controlling  either)  How long does it take for the procedure to work? You should feel relief from your usual pain within the first hour. Again, this is only expected to last for several hours, at the most. Remember, you may be sore in the middle part of your back from the needles, and you must distinguish this from your usual pain. ____________________________________________________________________________________________  Pain Management Discharge Instructions  General Discharge Instructions :  If you need to reach your doctor call: Monday-Friday 8:00 am - 4:00 pm at [phone] or toll free [phone].  After clinic hours [phone] to have operator reach doctor.  Bring all of your medication bottles to all your appointments in the pain clinic.  To cancel or reschedule your appointment with Pain Management please remember to call 24 hours in advance to avoid a fee.  Refer to the educational materials which you have been given on: General Risks, I had my Procedure. Discharge Instructions, Post Sedation.  Post Procedure Instructions:  The drugs you were given will stay in your system until tomorrow, so for the next 24 hours you should not drive, make any legal decisions or drink any alcoholic beverages.  You may eat anything you prefer, but it is better to start with liquids then soups and crackers, and gradually work up to solid foods.  Please notify your doctor immediately if you have any unusual bleeding, trouble breathing or pain that is not related to your normal pain.  Depending on the type of procedure that was done, some parts of your body may feel week and/or numb.  This usually clears up by tonight or the next day.  Walk with the use of an assistive device or accompanied by an adult for the 24 hours.  You may use ice on the affected area for the first 24 hours.  Put ice in a Ziploc bag and cover with a towel and place against area 15 minutes on 15 minutes off.  You may switch to  heat after 24 hours.GENERAL RISKS AND COMPLICATIONS  What are the risk, side effects and possible complications? Generally speaking, most procedures are safe.  However, with any procedure there are risks, side effects, and the possibility of complications.  The risks and complications are dependent upon the sites that are lesioned, or the type of nerve block to be performed.  The closer the procedure is to the spine, the more serious the risks are.  Great care is taken when placing the radio frequency needles, block needles or lesioning probes, but sometimes complications can occur. 1. Infection: Any time there is an injection through the skin, there is a risk of infection.  This is why sterile conditions are used for these blocks.  There are four possible types of infection. 1. Localized skin infection. 2. Central Nervous System Infection-This can be in the form of Meningitis, which can be deadly. 3. Epidural Infections-This can be in the form of an epidural abscess, which can cause pressure inside of the spine, causing compression of the spinal cord with subsequent paralysis. This would require an emergency surgery to decompress, and there are no guarantees that the patient would recover from the paralysis. 4. Discitis-This is an infection of the intervertebral discs.  It occurs in about 1% of discography procedures.  It is difficult to treat and it  may lead to surgery.        2. Pain: the needles have to go through skin and soft tissues, will cause soreness.       3. Damage to internal structures:  The nerves to be lesioned may be near blood vessels or    other nerves which can be potentially damaged.       4. Bleeding: Bleeding is more common if the patient is taking blood thinners such as  aspirin, Coumadin, Ticiid, Plavix, etc., or if he/she have some genetic predisposition  such as hemophilia. Bleeding into the spinal canal can cause compression of the spinal  cord with subsequent paralysis.   This would require an emergency surgery to  decompress and there are no guarantees that the patient would recover from the  paralysis.       5. Pneumothorax:  Puncturing of a lung is a possibility, every time a needle is introduced in  the area of the chest or upper back.  Pneumothorax refers to free air around the  collapsed lung(s), inside of the thoracic cavity (chest cavity).  Another two possible  complications related to a similar event would include: Hemothorax and Chylothorax.   These are variations of the Pneumothorax, where instead of air around the collapsed  lung(s), you may have blood or chyle, respectively.       6. Spinal headaches: They may occur with any procedures in the area of the spine.       7. Persistent CSF (Cerebro-Spinal Fluid) leakage: This is a rare problem, but may occur  with prolonged intrathecal or epidural catheters either due to the formation of a fistulous  track or a dural tear.       8. Nerve damage: By working so close to the spinal cord, there is always a possibility of  nerve damage, which could be as serious as a permanent spinal cord injury with  paralysis.       9. Death:  Although rare, severe deadly allergic reactions known as "Anaphylactic  reaction" can occur to any of the medications used.      10. Worsening of the symptoms:  We can always make thing worse.  What are the chances of something like this happening? Chances of any of this occuring are extremely low.  By statistics, you have more of a chance of getting killed in a motor vehicle accident: while driving to the hospital than any of the above occurring .  Nevertheless, you should be aware that they are possibilities.  In general, it is similar to taking a shower.  Everybody knows that you can slip, hit your head and get killed.  Does that mean that you should not shower again?  Nevertheless always keep in mind that statistics do not mean anything if you happen to be on the wrong side of them.  Even if  a procedure has a 1 (one) in a 1,000,000 (million) chance of going wrong, it you happen to be that one..Also, keep in mind that by statistics, you have more of a chance of having something go wrong when taking medications.  Who should not have this procedure? If you are on a blood thinning medication (e.g. Coumadin, Plavix, see list of "Blood Thinners"), or if you have an active infection going on, you should not have the procedure.  If you are taking any blood thinners, please inform your physician.  How should I prepare for this procedure?  Do not eat or drink anything at  least six hours prior to the procedure.  Bring a driver with you .  It cannot be a taxi.  Come accompanied by an adult that can drive you back, and that is strong enough to help you if your legs get weak or numb from the local anesthetic.  Take all of your medicines the morning of the procedure with just enough water to swallow them.  If you have diabetes, make sure that you are scheduled to have your procedure done first thing in the morning, whenever possible.  If you have diabetes, take only half of your insulin dose and notify our nurse that you have done so as soon as you arrive at the clinic.  If you are diabetic, but only take blood sugar pills (oral hypoglycemic), then do not take them on the morning of your procedure.  You may take them after you have had the procedure.  Do not take aspirin or any aspirin-containing medications, at least eleven (11) days prior to the procedure.  They may prolong bleeding.  Wear loose fitting clothing that may be easy to take off and that you would not mind if it got stained with Betadine or blood.  Do not wear any jewelry or perfume  Remove any nail coloring.  It will interfere with some of our monitoring equipment.  NOTE: Remember that this is not meant to be interpreted as a complete list of all possible complications.  Unforeseen problems may occur.  BLOOD THINNERS The  following drugs contain aspirin or other products, which can cause increased bleeding during surgery and should not be taken for 2 weeks prior to and 1 week after surgery.  If you should need take something for relief of minor pain, you may take acetaminophen which is found in Tylenol,m Datril, Anacin-3 and Panadol. It is not blood thinner. The products listed below are.  Do not take any of the products listed below in addition to any listed on your instruction sheet.  A.P.C or A.P.C with Codeine Codeine Phosphate Capsules #3 Ibuprofen Ridaura  ABC compound Congesprin Imuran rimadil  Advil Cope Indocin Robaxisal  Alka-Seltzer Effervescent Pain Reliever and Antacid Coricidin or Coricidin-D  Indomethacin Rufen  Alka-Seltzer plus Cold Medicine Cosprin Ketoprofen S-A-C Tablets  Anacin Analgesic Tablets or Capsules Coumadin Korlgesic Salflex  Anacin Extra Strength Analgesic tablets or capsules CP-2 Tablets Lanoril Salicylate  Anaprox Cuprimine Capsules Levenox Salocol  Anexsia-D Dalteparin Magan Salsalate  Anodynos Darvon compound Magnesium Salicylate Sine-off  Ansaid Dasin Capsules Magsal Sodium Salicylate  Anturane Depen Capsules Marnal Soma  APF Arthritis pain formula Dewitt's Pills Measurin Stanback  Argesic Dia-Gesic Meclofenamic Sulfinpyrazone  Arthritis Bayer Timed Release Aspirin Diclofenac Meclomen Sulindac  Arthritis pain formula Anacin Dicumarol Medipren Supac  Analgesic (Safety coated) Arthralgen Diffunasal Mefanamic Suprofen  Arthritis Strength Bufferin Dihydrocodeine Mepro Compound Suprol  Arthropan liquid Dopirydamole Methcarbomol with Aspirin Synalgos  ASA tablets/Enseals Disalcid Micrainin Tagament  Ascriptin Doan's Midol Talwin  Ascriptin A/D Dolene Mobidin Tanderil  Ascriptin Extra Strength Dolobid Moblgesic Ticlid  Ascriptin with Codeine Doloprin or Doloprin with Codeine Momentum Tolectin  Asperbuf Duoprin Mono-gesic Trendar  Aspergum Duradyne Motrin or Motrin IB Triminicin   Aspirin plain, buffered or enteric coated Durasal Myochrisine Trigesic  Aspirin Suppositories Easprin Nalfon Trillsate  Aspirin with Codeine Ecotrin Regular or Extra Strength Naprosyn Uracel  Atromid-S Efficin Naproxen Ursinus  Auranofin Capsules Elmiron Neocylate Vanquish  Axotal Emagrin Norgesic Verin  Azathioprine Empirin or Empirin with Codeine Normiflo Vitamin E  Azolid Emprazil Nuprin Voltaren  [employer]  Aspirin plain, buffered or children's or timed BC Tablets or powders Encaprin Orgaran Warfarin Sodium  Buff-a-Comp Enoxaparin Orudis Zorpin  Buff-a-Comp with Codeine Equegesic Os-Cal-Gesic   Buffaprin Excedrin plain, buffered or Extra Strength Oxalid   Bufferin Arthritis Strength Feldene Oxphenbutazone   Bufferin plain or Extra Strength Feldene Capsules Oxycodone with Aspirin   Bufferin with Codeine Fenoprofen Fenoprofen Pabalate or Pabalate-SF   Buffets II Flogesic Panagesic   Buffinol plain or Extra Strength Florinal or Florinal with Codeine Panwarfarin   Buf-Tabs Flurbiprofen Penicillamine   Butalbital Compound Four-way cold tablets Penicillin   Butazolidin Fragmin Pepto-Bismol   Carbenicillin Geminisyn Percodan   Carna Arthritis Reliever Geopen Persantine   Carprofen Gold's salt Persistin   Chloramphenicol Goody's Phenylbutazone   Chloromycetin Haltrain Piroxlcam   Clmetidine heparin Plaquenil   Cllnoril Hyco-pap Ponstel   Clofibrate Hydroxy chloroquine Propoxyphen         Before stopping any of these medications, be sure to consult the physician who ordered them.  Some, such as Coumadin (Warfarin) are ordered to prevent or treat serious conditions such as "deep thrombosis", "pumonary embolisms", and other heart problems.  The amount of time that you may need off of the medication may also vary with the medication and the reason for which you were taking it.  If you are taking any of these medications, please make sure you notify your pain physician before you undergo any  procedures.

## 2017-12-26 NOTE — Progress Notes (Signed)
Safety precautions to be maintained throughout the outpatient stay will include: orient to surroundings, keep bed in low position, maintain call bell within reach at all times, provide assistance with transfer out of bed and ambulation.  

## 2017-12-26 NOTE — Patient Instructions (Signed)
____________________________________________________________________________________________  Genicular Nerve Block  What is a genicular nerve block? A genicular nerve block is the injection of a local anesthetic to block the nerves that transmits pain from the knee.  What is the purpose of a facet nerve block? A genicular nerve block is a diagnostic procedure to determine if the pathologic changes (i.e. arthritis, meniscal tears, etc) and inflammation within the knee joint is the source of your knee pain. It also confirms that the knee pain will respond well to the actual treatment procedure. If a genicular nerve block works, it will give you relief for several hours. After that, the pain is expected to return to normal. This test is always performed twice (usually a week or two apart) because two successful tests are required to move onto treatment. If both diagnostic tests are positive, then we schedule a treatment called radiofrequency (RF) ablation. In this procedure, the same nerves are cauterized, which typically leads to pain relief for 4 -18 months. If this process works well for one knee, it can be performed on the other knee if needed.  How is the procedure performed? You will be placed on the procedure table. The injection site is sterilized with either iodine or chlorhexadine. The site to be injected is numbed with a local anesthetic, and a needle is directed to the target area. X-ray guidance is used to ensure proper placement and positioning of the needle. When the needle is properly positioned near the genicular nerve, local anesthetic is injected to numb that nerve. This will be repeated at multiple sites around the knee to block all genicular nerves.  Will the procedure be painful? The injection can be painful and we therefore provide the option of receiving IV sedation. IV sedation, combined with local anesthetic, can make the injection nearly pain free. It allows you to remain very  still during the procedure, which can also make the injection easier, faster, and more successful. If you decide to have IV sedation, you must have a driver to get you home safely afterwards. In addition, you cannot have anything to eat or drink within 8 hours of your appointment (clear liquids are allowed until 3 hours before the procedure). If you take medications for diabetes, these medications may need to be adjusted the morning of the procedure. Your primary care physician can help you with this adjustment.  What are the discharge instructions? If you received IV sedation do not drive or operate machinery for at least 24 hours after the procedure. You may return to work the next day following your procedure. You may resume your normal diet immediately. Do not engage in any strenuous activity for 24 hours. You should, however, engage in moderate activity that typically causes your ususal pain. If the block works, those activities should not be painful for several hours after the injection. Do not take a bath, swim, or use a hot tub for 24 hours (you may take a shower). Call the office if you have any of the following: severe pain afterwards (different than your usual symptoms), redness/swelling/discharge at the injection site(s), fevers/chills, difficulty with bowel or bladder functions.  What are the risks and side effects? The complication rate for this procedure is very low. Whenever a needle enters the skin, bleeding or infection can occur. Some other serious but extremely rare risks include paralysis and death. You may have an allergic reaction to any of the medications used. If you have a known allergy to any medications, especially local anesthetics, notify  our staff before the procedure takes place. You may experience any of the following side effects up to 4 - 6 hours after the procedure: Leg muscle weakness or numbness may occur due to the local anesthetic affecting the nerves that control  your legs (this is a temporary affect and it is not paralysis). If you have any leg weakness or numbness, walk only with assistance in order to prevent falls and injury. Your leg strength will return slowly and completely. Dizziness may occur due to a decrease in your blood pressure. If this occurs, remain in a seated or lying position. Gradually sit up, and then stand after at least 10 minutes of sitting. Mild headaches may occur. Drink fluids and take pain medications if needed. If the headaches persist or become severe, call the office. Mild discomfort at the injection site can occur. This typically lasts for a few hours but can persist for a couple days. If this occurs, take anti-inflammatories or pain medications, apply ice to the area the day of the procedure. If it persists, apply moist heat in the day(s) following.  The side effects listed above can be normal. They are not dangerous and will resolve on their own. If, however, you experience any of the following, a complication may have occurred and you should either contact your doctor. If he is not readily available, then you should proceed to the closest urgent care center for evaluation: Severe or progressive pain at the injection site(s) Arm or leg weakness that progressively worsens or persists for longer than 8 hours Severe or progressive redness, swelling, or discharge from the injections site(s) Fevers, chills, nausea, or vomiting Bowel or bladder dysfunction (i.e. inability to urinate or pass stool or difficulty controlling either)  How long does it take for the procedure to work? You should feel relief from your usual pain within the first hour. Again, this is only expected to last for several hours, at the most. Remember, you may be sore in the middle part of your back from the needles, and you must distinguish this from your usual pain. ____________________________________________________________________________________________   Pain Management Discharge Instructions  General Discharge Instructions :  If you need to reach your doctor call: Monday-Friday 8:00 am - 4:00 pm at [phone] or toll free [phone].  After clinic hours [phone] to have operator reach doctor.  Bring all of your medication bottles to all your appointments in the pain clinic.  To cancel or reschedule your appointment with Pain Management please remember to call 24 hours in advance to avoid a fee.  Refer to the educational materials which you have been given on: General Risks, I had my Procedure. Discharge Instructions, Post Sedation.  Post Procedure Instructions:  The drugs you were given will stay in your system until tomorrow, so for the next 24 hours you should not drive, make any legal decisions or drink any alcoholic beverages.  You may eat anything you prefer, but it is better to start with liquids then soups and crackers, and gradually work up to solid foods.  Please notify your doctor immediately if you have any unusual bleeding, trouble breathing or pain that is not related to your normal pain.  Depending on the type of procedure that was done, some parts of your body may feel week and/or numb.  This usually clears up by tonight or the next day.  Walk with the use of an assistive device or accompanied by an adult for the 24 hours.  You may use ice   adult for the 24 hours.  You may use ice on the affected area for the first 24 hours.  Put ice in a Ziploc bag and cover with a towel and place against area 15 minutes on 15 minutes off.  You may switch to heat after 24 hours.GENERAL RISKS AND COMPLICATIONS  What are the risk, side effects and possible complications? Generally speaking, most procedures are safe.  However, with any procedure there are risks, side effects, and the possibility of complications.  The risks and complications are dependent upon the sites  that are lesioned, or the type of nerve block to be performed.  The closer the procedure is to the spine, the more serious the risks are.  Great care is taken when placing the radio frequency needles, block needles or lesioning probes, but sometimes complications can occur. 1. Infection: Any time there is an injection through the skin, there is a risk of infection.  This is why sterile conditions are used for these blocks.  There are four possible types of infection. 1. Localized skin infection. 2. Central Nervous System Infection-This can be in the form of Meningitis, which can be deadly. 3. Epidural Infections-This can be in the form of an epidural abscess, which can cause pressure inside of the spine, causing compression of the spinal cord with subsequent paralysis. This would require an emergency surgery to decompress, and there are no guarantees that the patient would recover from the paralysis. 4. Discitis-This is an infection of the intervertebral discs.  It occurs in about 1% of discography procedures.  It is difficult to treat and it may lead to surgery.        2. Pain: the needles have to go through skin and soft tissues, will cause soreness.       3. Damage to internal structures:  The nerves to be lesioned may be near blood vessels or    other nerves which can be potentially damaged.       4. Bleeding: Bleeding is more common if the patient is taking blood thinners such as  aspirin, Coumadin, Ticiid, Plavix, etc., or if he/she have some genetic predisposition  such as hemophilia. Bleeding into the spinal canal can cause compression of the spinal  cord with subsequent paralysis.  This would require an emergency surgery to  decompress and there are no guarantees that the patient would recover from the  paralysis.       5. Pneumothorax:  Puncturing of a lung is a possibility, every time a needle is introduced in  the area of the chest or upper back.  Pneumothorax refers to free air around the   collapsed lung(s), inside of the thoracic cavity (chest cavity).  Another two possible  complications related to a similar event would include: Hemothorax and Chylothorax.   These are variations of the Pneumothorax, where instead of air around the collapsed  lung(s), you may have blood or chyle, respectively.       6. Spinal headaches: They may occur with any procedures in the area of the spine.       7. Persistent CSF (Cerebro-Spinal Fluid) leakage: This is a rare problem, but may occur  with prolonged intrathecal or epidural catheters either due to the formation of a fistulous  track or a dural tear.       8. Nerve damage: By working so close to the spinal cord, there is always a possibility of  nerve damage, which could be as serious as a permanent spinal   cord injury with  paralysis.       9. Death:  Although rare, severe deadly allergic reactions known as "Anaphylactic  reaction" can occur to any of the medications used.      10. Worsening of the symptoms:  We can always make thing worse.  What are the chances of something like this happening? Chances of any of this occuring are extremely low.  By statistics, you have more of a chance of getting killed in a motor vehicle accident: while driving to the hospital than any of the above occurring .  Nevertheless, you should be aware that they are possibilities.  In general, it is similar to taking a shower.  Everybody knows that you can slip, hit your head and get killed.  Does that mean that you should not shower again?  Nevertheless always keep in mind that statistics do not mean anything if you happen to be on the wrong side of them.  Even if a procedure has a 1 (one) in a 1,000,000 (million) chance of going wrong, it you happen to be that one..Also, keep in mind that by statistics, you have more of a chance of having something go wrong when taking medications.  Who should not have this procedure? If you are on a blood thinning medication (e.g.  Coumadin, Plavix, see list of "Blood Thinners"), or if you have an active infection going on, you should not have the procedure.  If you are taking any blood thinners, please inform your physician.  How should I prepare for this procedure?  Do not eat or drink anything at least six hours prior to the procedure.  Bring a driver with you .  It cannot be a taxi.  Come accompanied by an adult that can drive you back, and that is strong enough to help you if your legs get weak or numb from the local anesthetic.  Take all of your medicines the morning of the procedure with just enough water to swallow them.  If you have diabetes, make sure that you are scheduled to have your procedure done first thing in the morning, whenever possible.  If you have diabetes, take only half of your insulin dose and notify our nurse that you have done so as soon as you arrive at the clinic.  If you are diabetic, but only take blood sugar pills (oral hypoglycemic), then do not take them on the morning of your procedure.  You may take them after you have had the procedure.  Do not take aspirin or any aspirin-containing medications, at least eleven (11) days prior to the procedure.  They may prolong bleeding.  Wear loose fitting clothing that may be easy to take off and that you would not mind if it got stained with Betadine or blood.  Do not wear any jewelry or perfume  Remove any nail coloring.  It will interfere with some of our monitoring equipment.  NOTE: Remember that this is not meant to be interpreted as a complete list of all possible complications.  Unforeseen problems may occur.  BLOOD THINNERS The following drugs contain aspirin or other products, which can cause increased bleeding during surgery and should not be taken for 2 weeks prior to and 1 week after surgery.  If you should need take something for relief of minor pain, you may take acetaminophen which is found in Tylenol,m Datril, Anacin-3 and  Panadol. It is not blood thinner. The products listed below are.  Do not take any   of the products listed below in addition to any listed on your instruction sheet.  A.P.C or A.P.C with Codeine Codeine Phosphate Capsules #3 Ibuprofen Ridaura  ABC compound Congesprin Imuran rimadil  Advil Cope Indocin Robaxisal  Alka-Seltzer Effervescent Pain Reliever and Antacid Coricidin or Coricidin-D  Indomethacin Rufen  Alka-Seltzer plus Cold Medicine Cosprin Ketoprofen S-A-C Tablets  Anacin Analgesic Tablets or Capsules Coumadin Korlgesic Salflex  Anacin Extra Strength Analgesic tablets or capsules CP-2 Tablets Lanoril Salicylate  Anaprox Cuprimine Capsules Levenox Salocol  Anexsia-D Dalteparin Magan Salsalate  Anodynos Darvon compound Magnesium Salicylate Sine-off  Ansaid Dasin Capsules Magsal Sodium Salicylate  Anturane Depen Capsules Marnal Soma  APF Arthritis pain formula Dewitt's Pills Measurin Stanback  Argesic Dia-Gesic Meclofenamic Sulfinpyrazone  Arthritis Bayer Timed Release Aspirin Diclofenac Meclomen Sulindac  Arthritis pain formula Anacin Dicumarol Medipren Supac  Analgesic (Safety coated) Arthralgen Diffunasal Mefanamic Suprofen  Arthritis Strength Bufferin Dihydrocodeine Mepro Compound Suprol  Arthropan liquid Dopirydamole Methcarbomol with Aspirin Synalgos  ASA tablets/Enseals Disalcid Micrainin Tagament  Ascriptin Doan's Midol Talwin  Ascriptin A/D Dolene Mobidin Tanderil  Ascriptin Extra Strength Dolobid Moblgesic Ticlid  Ascriptin with Codeine Doloprin or Doloprin with Codeine Momentum Tolectin  Asperbuf Duoprin Mono-gesic Trendar  Aspergum Duradyne Motrin or Motrin IB Triminicin  Aspirin plain, buffered or enteric coated Durasal Myochrisine Trigesic  Aspirin Suppositories Easprin Nalfon Trillsate  Aspirin with Codeine Ecotrin Regular or Extra Strength Naprosyn Uracel  Atromid-S Efficin Naproxen Ursinus  Auranofin Capsules Elmiron Neocylate Vanquish  Axotal Emagrin Norgesic  Verin  Azathioprine Empirin or Empirin with Codeine Normiflo Vitamin E  Azolid Emprazil Nuprin Voltaren  Bayer Aspirin plain, buffered or children's or timed BC Tablets or powders Encaprin Orgaran Warfarin Sodium  Buff-a-Comp Enoxaparin Orudis Zorpin  Buff-a-Comp with Codeine Equegesic Os-Cal-Gesic   Buffaprin Excedrin plain, buffered or Extra Strength Oxalid   Bufferin Arthritis Strength Feldene Oxphenbutazone   Bufferin plain or Extra Strength Feldene Capsules Oxycodone with Aspirin   Bufferin with Codeine Fenoprofen Fenoprofen Pabalate or Pabalate-SF   Buffets II Flogesic Panagesic   Buffinol plain or Extra Strength Florinal or Florinal with Codeine Panwarfarin   Buf-Tabs Flurbiprofen Penicillamine   Butalbital Compound Four-way cold tablets Penicillin   Butazolidin Fragmin Pepto-Bismol   Carbenicillin Geminisyn Percodan   Carna Arthritis Reliever Geopen Persantine   Carprofen Gold's salt Persistin   Chloramphenicol Goody's Phenylbutazone   Chloromycetin Haltrain Piroxlcam   Clmetidine heparin Plaquenil   Cllnoril Hyco-pap Ponstel   Clofibrate Hydroxy chloroquine Propoxyphen         Before stopping any of these medications, be sure to consult the physician who ordered them.  Some, such as Coumadin (Warfarin) are ordered to prevent or treat serious conditions such as "deep thrombosis", "pumonary embolisms", and other heart problems.  The amount of time that you may need off of the medication may also vary with the medication and the reason for which you were taking it.  If you are taking any of these medications, please make sure you notify your pain physician before you undergo any procedures.          

## 2017-12-30 ENCOUNTER — Encounter: Payer: Self-pay | Admitting: Hematology and Oncology

## 2018-01-01 ENCOUNTER — Telehealth: Payer: Self-pay

## 2018-01-01 NOTE — Telephone Encounter (Signed)
Spoke with the patient to see if she was currently taking oral b-12 and she states she is taking b-12 daily. I have inform the patient that I will give information to Honor Loh NP, and if labs are needed we will get you schedule as soon as possible and give you a call. The patient was understanding and agreeable to get labs.

## 2018-01-02 ENCOUNTER — Ambulatory Visit
Admission: RE | Admit: 2018-01-02 | Discharge: 2018-01-02 | Disposition: A | Discharge status: Home / Self Care | Payer: Medicare HMO | Source: Ambulatory Visit | Attending: Radiation Oncology | Admitting: Radiation Oncology

## 2018-01-02 ENCOUNTER — Other Ambulatory Visit: Payer: Self-pay

## 2018-01-02 ENCOUNTER — Encounter: Payer: Self-pay | Admitting: Radiation Oncology

## 2018-01-02 ENCOUNTER — Inpatient Hospital Stay: Payer: Medicare HMO | Attending: Hematology and Oncology

## 2018-01-02 VITALS — BP 125/75 | HR 90 | Temp 99.0°F | Resp 20 | Ht 65.0 in | Wt 205.4 lb

## 2018-01-02 DIAGNOSIS — Z170 Estrogen receptor positive status [ER+]: Principal | ICD-10-CM

## 2018-01-02 DIAGNOSIS — Z79811 Long term (current) use of aromatase inhibitors: Secondary | ICD-10-CM | POA: Insufficient documentation

## 2018-01-02 DIAGNOSIS — Z923 Personal history of irradiation: Secondary | ICD-10-CM | POA: Insufficient documentation

## 2018-01-02 DIAGNOSIS — C50411 Malignant neoplasm of upper-outer quadrant of right female breast: Secondary | ICD-10-CM

## 2018-01-02 DIAGNOSIS — D509 Iron deficiency anemia, unspecified: Secondary | ICD-10-CM | POA: Insufficient documentation

## 2018-01-02 DIAGNOSIS — E538 Deficiency of other specified B group vitamins: Secondary | ICD-10-CM

## 2018-01-02 DIAGNOSIS — Z79899 Other long term (current) drug therapy: Secondary | ICD-10-CM | POA: Diagnosis not present

## 2018-01-02 DIAGNOSIS — M810 Age-related osteoporosis without current pathological fracture: Secondary | ICD-10-CM | POA: Diagnosis not present

## 2018-01-02 LAB — CBC WITH DIFFERENTIAL/PLATELET
Abs Immature Granulocytes: 0.04 10*3/uL (ref 0.00–0.07)
Basophils Absolute: 0 10*3/uL (ref 0.0–0.1)
Basophils Relative: 1 %
Eosinophils Absolute: 0.1 10*3/uL (ref 0.0–0.5)
Eosinophils Relative: 2 %
HCT: 36.9 % (ref 36.0–46.0)
Hemoglobin: 12 g/dL (ref 12.0–15.0)
Immature Granulocytes: 1 %
Lymphocytes Relative: 17 %
Lymphs Abs: 1 10*3/uL (ref 0.7–4.0)
MCH: 30.2 pg (ref 26.0–34.0)
MCHC: 32.5 g/dL (ref 30.0–36.0)
MCV: 92.7 fL (ref 80.0–100.0)
Monocytes Absolute: 0.6 10*3/uL (ref 0.1–1.0)
Monocytes Relative: 10 %
Neutro Abs: 4.1 10*3/uL (ref 1.7–7.7)
Neutrophils Relative %: 69 %
Platelets: 179 10*3/uL (ref 150–400)
RBC: 3.98 MIL/uL (ref 3.87–5.11)
RDW: 16.4 % — ABNORMAL HIGH (ref 11.5–15.5)
WBC: 5.8 10*3/uL (ref 4.0–10.5)
nRBC: 0 % (ref 0.0–0.2)

## 2018-01-02 LAB — VITAMIN B12: Vitamin B-12: 1451 pg/mL — ABNORMAL HIGH (ref 180–914)

## 2018-01-02 LAB — FERRITIN: Ferritin: 74 ng/mL (ref 11–307)

## 2018-01-02 NOTE — Progress Notes (Signed)
Radiation Oncology Follow up Note  Name: Allison Hall   Date:   01/02/2018 MRN:  [id number] DOB: 11-25-1940    This 77 y.o. female presents to the clinic today for 11 month follow-up status post whole breast radiation to her right breast for stage I ER/PR positive invasive mammary carcinoma.  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 Seen today in routine follow-up she is doing well. She specifically denies breast tenderness cough or bone pain..she's currently on Aromasin tolerating that well without side effect.mammograms back in June which I have reviewed were BI-RADS 2 benign.  COMPLICATIONS OF TREATMENT: none  FOLLOW UP COMPLIANCE: keeps appointments   PHYSICAL EXAM:  BP 125/75 (BP Location: Left Arm, Patient Position: Sitting)   Pulse 90   Temp 99 F (37.2 C)   Resp 20   Ht '5\' 5"'$  (1.651 m)   Wt 205 lb 5.7 oz (93.2 kg)   BMI 34.17 kg/m  Lungs are clear to A&P cardiac examination essentially unremarkable with regular rate and rhythm. No dominant mass or nodularity is noted in either breast in 2 positions examined. Incision is well-healed. No axillary or supraclavicular adenopathy is appreciated. Cosmetic result is excellent.Well-developed well-nourished patient in NAD. HEENT reveals PERLA, EOMI, discs not visualized.  Oral cavity is clear. No oral mucosal lesions are identified. Neck is clear without evidence of cervical or supraclavicular adenopathy. Lungs are clear to A&P. Cardiac examination is essentially unremarkable with regular rate and rhythm without murmur rub or thrill. Abdomen is benign with no organomegaly or masses noted. Motor sensory and DTR levels are equal and symmetric in the upper and lower extremities. Cranial nerves II through XII are grossly intact. Proprioception is  intact. No peripheral adenopathy or edema is identified. No motor or sensory levels are noted. Crude visual fields are within normal range.  RADIOLOGY RESULTS: mammograms reviewed and compatible with the above-stated findings  PLAN: present time patient is doing well with no evidence of disease. I'm please were overall progress. I've asked to see her back in 1 year for follow-up. She's are scheduled for mammograms next June. She continues on Aromasin without side effect. Patient knows to call at anytime with any concerns.  I would like to take this opportunity to thank you for allowing me to participate in the care of your patient.Noreene Filbert, MD

## 2018-01-03 ENCOUNTER — Encounter: Payer: Self-pay | Admitting: Emergency Medicine

## 2018-01-03 ENCOUNTER — Telehealth: Payer: Self-pay

## 2018-01-03 ENCOUNTER — Other Ambulatory Visit: Payer: Self-pay

## 2018-01-03 ENCOUNTER — Ambulatory Visit
Admission: EM | Admit: 2018-01-03 | Discharge: 2018-01-03 | Disposition: A | Discharge status: Home / Self Care | Payer: Medicare HMO | Attending: Family Medicine | Admitting: Family Medicine

## 2018-01-03 ENCOUNTER — Ambulatory Visit (INDEPENDENT_AMBULATORY_CARE_PROVIDER_SITE_OTHER): Payer: Medicare HMO

## 2018-01-03 DIAGNOSIS — W06XXXA Fall from bed, initial encounter: Secondary | ICD-10-CM

## 2018-01-03 DIAGNOSIS — W228XXA Striking against or struck by other objects, initial encounter: Secondary | ICD-10-CM | POA: Diagnosis not present

## 2018-01-03 DIAGNOSIS — R0789 Other chest pain: Secondary | ICD-10-CM

## 2018-01-03 DIAGNOSIS — S20211A Contusion of right front wall of thorax, initial encounter: Secondary | ICD-10-CM | POA: Diagnosis not present

## 2018-01-03 DIAGNOSIS — W19XXXA Unspecified fall, initial encounter: Secondary | ICD-10-CM

## 2018-01-03 DIAGNOSIS — Y92003 Bedroom of unspecified non-institutional (private) residence as the place of occurrence of the external cause: Secondary | ICD-10-CM

## 2018-01-03 DIAGNOSIS — Y92009 Unspecified place in unspecified non-institutional (private) residence as the place of occurrence of the external cause: Secondary | ICD-10-CM

## 2018-01-03 LAB — INTRINSIC FACTOR ANTIBODIES: Intrinsic Factor: 1.1 AU/mL (ref 0.0–1.1)

## 2018-01-03 LAB — ANTI-PARIETAL ANTIBODY: Parietal Cell Antibody-IgG: 3.9 Units (ref 0.0–20.0)

## 2018-01-03 MED ORDER — ACETAMINOPHEN-CODEINE #3 300-30 MG PO TABS: 1.0000 | ORAL_TABLET | Freq: Four times a day (QID) | ORAL | 0 refills | Status: DC | PRN

## 2018-01-03 NOTE — ED Triage Notes (Signed)
Patient c/o pain on the right side of her abdomen and right sided rib pain.  Patient states that fell out of bed and hit the bedside table.

## 2018-01-03 NOTE — ED Provider Notes (Signed)
[facility]    CSN: [id number] Arrival date & time: 01/03/18  1517     History   Chief Complaint Chief Complaint  Patient presents with  . Fall  . Abdominal Pain    right side  . Rib Pain    right    HPI Allison Hall is a 77 y.o. female.   HPI  The 77 year old female presents with pain on the right lateral side of her dominant and lower ribs.  States that she fell out of bed this morning early while sleeping and hit the bedside table.  States she had difficulty standing from the floor.  She is not had any significant abdominal pain but has pain over her lower ribs which hurts to to take in deep breath and certain motions.  Became more alarmed when she noticed a large bruise had developed.  Not on blood thinners.          Past Medical History:  Diagnosis Date  . Anxiety   . Breast cancer ([place]) 08/24/2016   right  . Cancer ([place])    skin ca  . Colon polyp   . Depression   . GERD (gastroesophageal reflux disease)   . Hypercholesteremia   . Hypertension   . Osteoarthritis   . Personal history of radiation therapy   . Thyroid nodule 04/17/2016    Patient Active Problem List   Diagnosis Date Noted  . Iron deficiency anemia 10/31/2017  . Low vitamin B12 level 10/31/2017  . Arthralgia 06/03/2017  . Osteopenia 10/04/2016  . Breast cancer of upper-outer quadrant of right female breast ([place]) 08/24/2016  . Closed fracture of distal end of ulna 06/01/2015  . Triggering of digit 05/27/2014  . Hamstring muscle strain 05/26/2014  . Arthritis of knee, degenerative 03/26/2014  . Long term current use of opiate analgesic 04/21/2013  . Arthritis, degenerative 04/21/2013  . Anxiety 10/05/2011  . Colon polyp 10/05/2011  . Esophageal foreign body 10/05/2011  . Acid reflux 10/05/2011  . BP (high blood pressure) 10/05/2011  . Hypercholesterolemia 10/05/2011    Past Surgical History:  Procedure Laterality Date  . ABDOMINAL HYSTERECTOMY    .  APPENDECTOMY    . BREAST BIOPSY Right 08/24/2016   9:30 - invasive mammary carcinoma with lobular features  . BREAST BIOPSY Right 08/24/2016   10:00 - invasive mammary carcinoma with lobular features  . BREAST LUMPECTOMY Right 10/09/2016  . COLONOSCOPY W/ POLYPECTOMY  2003, 2008, 2014  . COLONOSCOPY WITH PROPOFOL N/A 12/03/2017   Procedure: COLONOSCOPY WITH PROPOFOL;  Surgeon: Manya Silvas, MD;  Location[facility] ENDOSCOPY;  Service: Endoscopy;  Laterality: N/A;  . ESOPHAGOGASTRODUODENOSCOPY     foreign body  . ESOPHAGOGASTRODUODENOSCOPY (EGD) WITH PROPOFOL N/A 12/03/2017   Procedure: ESOPHAGOGASTRODUODENOSCOPY (EGD) WITH PROPOFOL;  Surgeon: Manya Silvas, MD;  Location: [facility] ENDOSCOPY;  Service: Endoscopy;  Laterality: N/A;  . JOINT REPLACEMENT Right 2008   TKR  . NOSE SURGERY    . PARTIAL MASTECTOMY WITH NEEDLE LOCALIZATION Right 09/29/2016   Procedure: PARTIAL MASTECTOMY WITH NEEDLE LOCALIZATION;  Surgeon: [name], MD;  Location: [facility] ORS;  Service: General;  Laterality: Right;  . REPLACEMENT TOTAL KNEE Right 2008  . SENTINEL NODE BIOPSY Right 09/29/2016   Procedure: SENTINEL NODE BIOPSY;  Surgeon: [name], MD;  Location: [facility] ORS;  Service: General;  Laterality: Right;    OB History   None      Home Medications    Prior to Admission medications   Medication  Sig Start Date End Date Taking? Authorizing Provider  amLODipine (NORVASC) 10 MG tablet Take 10 mg by mouth daily. 09/20/17 09/20/18 Yes [provider]  buPROPion (WELLBUTRIN XL) 300 MG 24 hr tablet Take 300 mg by mouth daily. 10/23/17 10/23/18 Yes [provider]  busPIRone (BUSPAR) 10 MG tablet Take 10 mg by mouth 3 (three) times daily. 04/17/16  Yes [provider]  calcium carbonate 1250 MG capsule Take 1,250 mg by mouth 2 (two) times daily with a meal.   Yes [provider]  Cholecalciferol (VITAMIN D3) 1000 units CAPS Take 1,000 Units by mouth 2 (two) times  daily.   Yes [provider]  denosumab (PROLIA) 60 MG/ML SOLN injection Inject 60 mg into the skin every 6 (six) months. Administer in upper arm, thigh, or abdomen   Yes [provider]  lisinopril (PRINIVIL,ZESTRIL) 40 MG tablet Take 40 mg by mouth daily. 09/20/17 09/20/18 Yes [provider]  loperamide (IMODIUM) 2 MG capsule Take 2 mg by mouth as needed for diarrhea or loose stools.    Yes [provider]  Magnesium 250 MG TABS Take 2 tablets by mouth daily.   Yes [provider]  omeprazole (PRILOSEC) 40 MG capsule Take 20 mg by mouth.  10/20/14 01/03/18 Yes [provider]  rosuvastatin (CRESTOR) 5 MG tablet Take 5 mg by mouth daily. 04/17/16 01/03/18 Yes [provider]  sertraline (ZOLOFT) 100 MG tablet Take 1.5 tablets (150 mg total) by mouth daily. Patient taking differently: Take 100-150 mg by mouth See admin instructions. Takes 100 mg daily except on Monday, Wednesday and Fridays and takes 150 mg (1.5 tablet) 05/19/15 01/03/18 Yes Ravi, Himabindu, MD  Turmeric Curcumin 500 MG CAPS Take 1,500 mg by mouth daily.    Yes [provider]  acetaminophen (TYLENOL) 500 MG tablet Take 500 mg by mouth 2 (two) times daily.    [provider]  acetaminophen-codeine (TYLENOL #3) 300-30 MG tablet Take 1 tablet by mouth every 6 (six) hours as needed for moderate pain. 01/03/18   Lorin Picket, PA-C  exemestane (AROMASIN) 25 MG tablet TAKE 1 TABLET (25 MG TOTAL) BY MOUTH DAILY AFTER BREAKFAST. 07/20/17   Karen Kitchens, NP    Family History Family History  Problem Relation Age of Onset  . Hypertension Mother   . Depression Mother   . Heart failure Father   . Obesity Brother   . Heart disease Brother   . Stroke Brother   . Depression Brother   . Breast cancer Cousin   . Breast cancer Cousin   . Breast cancer Cousin     Social History Social History   Tobacco Use  . Smoking status: Former Smoker    Packs/day:  1.00    Years: 10.00    Pack years: 10.00    Last attempt to quit: 05/18/1965    Years since quitting: 52.6  . Smokeless tobacco: Never Used  Substance Use Topics  . Alcohol use: Yes    Alcohol/week: 7.0 standard drinks    Types: 7 Glasses of wine per week  . Drug use: No     Allergies   Sulfamethoxazole; Statins; Trazodone; Ciprofloxacin; and Sulfa antibiotics   Review of Systems Review of Systems  Constitutional: Positive for activity change. Negative for appetite change, chills, fatigue and fever.  Gastrointestinal: Positive for abdominal pain. Negative for abdominal distention.  Musculoskeletal: Positive for myalgias.  All other systems reviewed and are negative.    Physical Exam Triage  Vital Signs ED Triage Vitals  Enc Vitals Group     BP 01/03/18 1528 (!) 141/72     Pulse Rate 01/03/18 1528 78     Resp 01/03/18 1528 16     Temp 01/03/18 1528 98.8 F (37.1 C)     Temp Source 01/03/18 1528 Oral     SpO2 01/03/18 1528 99 %     Weight 01/03/18 1527 205 lb (93 kg)     Height 01/03/18 1527 5\' 5"  (1.651 m)     Head Circumference --      Peak Flow --      Pain Score 01/03/18 1527 2     Pain Loc --      Pain Edu? --      Excl. in [place]? --    No data found.  Updated Vital Signs BP (!) 141/72 (BP Location: Left Arm)   Pulse 78   Temp 98.8 F (37.1 C) (Oral)   Resp 16   Ht 5\' 5"  (1.651 m)   Wt 205 lb (93 kg)   SpO2 99%   BMI 34.11 kg/m   Visual Acuity Right Eye Distance:   Left Eye Distance:   Bilateral Distance:    Right Eye Near:   Left Eye Near:    Bilateral Near:     Physical Exam  Constitutional: She is oriented to person, place, and time. She appears well-developed and well-nourished.  Non-toxic appearance. She does not appear ill. No distress.  HENT:  Head: Normocephalic.  Mouth/Throat: Oropharynx is clear and moist.  Eyes: Pupils are equal, round, and reactive to light.  Pulmonary/Chest: Effort normal and breath sounds normal. She exhibits  tenderness.  She has no breast tenderness or bruising.  She does have tenderness in the underlying ribs.  Abdominal: Soft. Normal appearance and bowel sounds are normal. There is tenderness. There is no rigidity, no rebound, no guarding and no CVA tenderness.  Neurological: She is alert and oriented to person, place, and time.  Skin: Skin is warm and dry.  Examination of the right lower ribs anteriorly lateral shows a large ecchymotic area measuring approximately meters by 8 cm.  He is tender to palpation.  There is no crepitus present.  Abdominal exam shows normal bowel sounds.  She has no rigidity.  There is no guarding.  Abdomen is soft and nondistended.  Psychiatric: She has a normal mood and affect. Her behavior is normal.  Nursing note and vitals reviewed.    UC Treatments / Results  Labs (all labs ordered are listed, but only abnormal results are displayed) Labs Reviewed - No data to display  EKG None  Radiology Dg Ribs Unilateral W/chest Right  Result Date: 01/03/2018 CLINICAL DATA:  Pain on the right side of the chest wall and abdomen. Patient fell from bed hitting the bedside table. EXAM: RIGHT RIBS AND CHEST - 3+ VIEW COMPARISON:  Chest radiograph, 12/08/2017. FINDINGS: No rib fracture or rib lesion. Cardiac silhouette is normal in size. No mediastinal or hilar masses. Clear lungs. No pleural effusion or pneumothorax. IMPRESSION: 1. No rib fracture or rib lesion. 2. No acute cardiopulmonary disease. Electronically Signed   By: Lajean Manes M.D.   On: 01/03/2018 16:20    Procedures Procedures (including critical care time)  Medications Ordered in UC Medications - No data to display  Initial Impression / Assessment and Plan / UC Course  I have reviewed the triage vital signs and the nursing notes.  Pertinent labs & imaging results that  were available during my care of the patient were reviewed by me and considered in my medical decision making (see chart for details).    Reviewed the x-rays with the patient.  Abdomen is benign at this point.  Told her the importance of coughing and deep breathing to prevent pneumonia or pneumonitis.  The amount of pain she is having I will prescribe a small amount of Tylenol 3. Substance abuse registry was consulted and the patient's last prescription for hydrocodone cough syrup was in 2017 and I recommended that she follow-up with her primary care physician next week   Final Clinical Impressions(s) / UC Diagnoses   Final diagnoses:  Rib contusion, right, initial encounter  Fall in home, initial encounter     Discharge Instructions     Apply ice 20 minutes out of every 2 hours 4-5 times daily for comfort.  Cough and deep breathe frequently to prevent pneumonia.  Follow-up with your primary care physician next week    ED Prescriptions    Medication Sig Dispense Auth. Provider   acetaminophen-codeine (TYLENOL #3) 300-30 MG tablet Take 1 tablet by mouth every 6 (six) hours as needed for moderate pain. 10 tablet Lorin Picket, PA-C     Controlled Substance Prescriptions [place] Controlled Substance Registry consulted? Patient has had no prescriptions for opiates since 2017   Lorin Picket, [place] 01/03/18 1729

## 2018-01-03 NOTE — Telephone Encounter (Signed)
Left a message on the patient voice mail to inform the patient that her B-12 levels are high at this time. If she can start taking the B-12 oral every other day. I have also informed the patient if she have any question please feel free to contact Dr Enedina Finner office at [phone].

## 2018-01-03 NOTE — Discharge Instructions (Signed)
Apply ice 20 minutes out of every 2 hours 4-5 times daily for comfort.  Cough and deep breathe frequently to prevent pneumonia.  Follow-up with your primary care physician next week

## 2018-01-07 ENCOUNTER — Other Ambulatory Visit: Payer: Self-pay | Admitting: *Deleted

## 2018-01-07 ENCOUNTER — Telehealth: Payer: Self-pay | Admitting: *Deleted

## 2018-01-07 DIAGNOSIS — Z170 Estrogen receptor positive status [ER+]: Principal | ICD-10-CM

## 2018-01-07 DIAGNOSIS — C50411 Malignant neoplasm of upper-outer quadrant of right female breast: Secondary | ICD-10-CM

## 2018-01-07 NOTE — Telephone Encounter (Signed)
Called patient to inquire how much B-12 she is taking.  She verified that she is taking 1000 mcg daily.  Per MD, patient should take 500 mcg daily and we will recheck in one month.  Patient verbalized understanding.  Will have scheduling call with appointment in one month.

## 2018-01-09 NOTE — Telephone Encounter (Signed)
Erroneous entry

## 2018-01-29 ENCOUNTER — Inpatient Hospital Stay: Payer: Medicare HMO | Attending: Hematology and Oncology

## 2018-01-29 DIAGNOSIS — D509 Iron deficiency anemia, unspecified: Secondary | ICD-10-CM | POA: Diagnosis present

## 2018-01-29 DIAGNOSIS — Z170 Estrogen receptor positive status [ER+]: Secondary | ICD-10-CM

## 2018-01-29 DIAGNOSIS — C50411 Malignant neoplasm of upper-outer quadrant of right female breast: Secondary | ICD-10-CM

## 2018-01-29 LAB — CBC WITH DIFFERENTIAL/PLATELET
Abs Immature Granulocytes: 0.05 10*3/uL (ref 0.00–0.07)
Basophils Absolute: 0.1 10*3/uL (ref 0.0–0.1)
Basophils Relative: 1 %
Eosinophils Absolute: 0.1 10*3/uL (ref 0.0–0.5)
Eosinophils Relative: 2 %
HCT: 38.9 % (ref 36.0–46.0)
Hemoglobin: 12.7 g/dL (ref 12.0–15.0)
Immature Granulocytes: 1 %
Lymphocytes Relative: 28 %
Lymphs Abs: 2 10*3/uL (ref 0.7–4.0)
MCH: 30.5 pg (ref 26.0–34.0)
MCHC: 32.6 g/dL (ref 30.0–36.0)
MCV: 93.5 fL (ref 80.0–100.0)
Monocytes Absolute: 0.9 10*3/uL (ref 0.1–1.0)
Monocytes Relative: 12 %
Neutro Abs: 4.2 10*3/uL (ref 1.7–7.7)
Neutrophils Relative %: 56 %
Platelets: 214 10*3/uL (ref 150–400)
RBC: 4.16 MIL/uL (ref 3.87–5.11)
RDW: 15.7 % — ABNORMAL HIGH (ref 11.5–15.5)
WBC: 7.3 10*3/uL (ref 4.0–10.5)
nRBC: 0 % (ref 0.0–0.2)

## 2018-01-29 LAB — FERRITIN: Ferritin: 64 ng/mL (ref 11–307)

## 2018-01-29 LAB — VITAMIN B12: Vitamin B-12: 888 pg/mL (ref 180–914)

## 2018-01-30 ENCOUNTER — Inpatient Hospital Stay: Payer: Medicare HMO

## 2018-01-30 ENCOUNTER — Inpatient Hospital Stay (HOSPITAL_BASED_OUTPATIENT_CLINIC_OR_DEPARTMENT_OTHER): Payer: Medicare HMO | Admitting: Hematology and Oncology

## 2018-01-30 ENCOUNTER — Encounter: Payer: Self-pay | Admitting: Hematology and Oncology

## 2018-01-30 VITALS — BP 120/79 | HR 93 | Temp 95.9°F | Resp 18 | Wt 208.8 lb

## 2018-01-30 DIAGNOSIS — M810 Age-related osteoporosis without current pathological fracture: Secondary | ICD-10-CM | POA: Diagnosis not present

## 2018-01-30 DIAGNOSIS — C50411 Malignant neoplasm of upper-outer quadrant of right female breast: Secondary | ICD-10-CM | POA: Diagnosis not present

## 2018-01-30 DIAGNOSIS — Z170 Estrogen receptor positive status [ER+]: Secondary | ICD-10-CM

## 2018-01-30 DIAGNOSIS — D509 Iron deficiency anemia, unspecified: Secondary | ICD-10-CM | POA: Diagnosis not present

## 2018-01-30 DIAGNOSIS — E538 Deficiency of other specified B group vitamins: Secondary | ICD-10-CM | POA: Diagnosis not present

## 2018-01-30 DIAGNOSIS — Z923 Personal history of irradiation: Secondary | ICD-10-CM

## 2018-01-30 DIAGNOSIS — Z79899 Other long term (current) drug therapy: Secondary | ICD-10-CM

## 2018-01-30 NOTE — Progress Notes (Signed)
[facility] day:  01/30/2018   Chief Complaint: Allison Hall is a 77 y.o. 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  HPI:  The patient was last seen in the medical oncology clinic on 10/31/2017.  At that time, she felt fatigued. Patient denied any B symptoms. She denied any interval infections. Patient denied bleeding; no hematochezia, melena, or gross hematuria.  Exam was stable.    Hematocrit was 32.4 with a hemoglobin of 10.7 on 08/29/2017.  Ferritin was 10 with an iron saturation of 10% and a TIBC of 479.  B12 was 263.  She received Venofer weekly x 3 (10/31/2017 - 11/13/2017).  She began oral B12.  Patient had interval EGD and colonoscopy on 12/03/2017. Polyps were noted. Pathology was negative for dysplasia and malignancy.   Labs on 01/02/2018 revealed a hematocrit of 36.9, hemoglobin 12.0, and MCV 92.7.  Ferritin was 74.  B12 was 1451.  Intrinsic factor and anti-parietal cell antibodies were normal.  B12 was decreased from 1000 mcg/day to 500 mcg/day.  Labs on 01/29/2018 revealed a hematocrit of 38.9, hemoglobin 12.7, and MCV 93.5.  Ferritin was 64.  B12 was 888.  She was seen in the [facility] on 01/03/2018 with rib and abdominal pain after falling out of bed.  She had an 8 cm area of ecchymosis on the right lower ribs which was tender to palpation.  Rib films revealed no fractures.  She was prescribed pain medications.  During the interim, patient is feeling "better these days" She has more energy. Patient is back in church and singing in the choir. Patient states, "I have felt so bad. I have not been to church in over a year". She denies any exertional dyspnea. Patient denies bleeding; no hematochezia, melena, or gross hematuria.   Patient denies that she has experienced any B symptoms.  Patient does not  verbalize any concerns with regards to her breasts today. Patient performs monthly self breast examinations as recommended. She continues on her endocrine therapy (Aromasin) as previously prescribed. She denies any interval infections.   Pain in her knees has improved following steroid injections. She has been referred to Dr. Gillis Santa (pain management) for a procedure that will "stop the pain for a year". Patient needs to have a TKR, however she is wanting to defer the procedure for as long as she can.   Patient advises that she maintains an adequate appetite. She is eating well. Weight today is 208 lb 12.4 oz (94.7 kg), which compared to her last visit to the clinic, represents a 4 pound increase.   Patient denies pain in the clinic today.   Past Medical History:  Diagnosis Date  . Anxiety   . Breast cancer ([place]) 08/24/2016   right  . Cancer ([place])    skin ca  . Colon polyp   . Depression   . GERD (gastroesophageal reflux disease)   . Hypercholesteremia   . Hypertension   . Osteoarthritis   . Personal history of radiation therapy   . Thyroid nodule 04/17/2016    Past Surgical History:  Procedure Laterality Date  . ABDOMINAL HYSTERECTOMY    . APPENDECTOMY    . BREAST BIOPSY Right 08/24/2016   9:30 - invasive mammary carcinoma with lobular features  . BREAST BIOPSY Right 08/24/2016   10:00 - invasive mammary carcinoma with lobular features  .  BREAST LUMPECTOMY Right 10/09/2016  . COLONOSCOPY W/ POLYPECTOMY  2003, 2008, 2014  . COLONOSCOPY WITH PROPOFOL N/A 12/03/2017   Procedure: COLONOSCOPY WITH PROPOFOL;  Surgeon: Manya Silvas, MD;  Location: [facility] ENDOSCOPY;  Service: Endoscopy;  Laterality: N/A;  . ESOPHAGOGASTRODUODENOSCOPY     foreign body  . ESOPHAGOGASTRODUODENOSCOPY (EGD) WITH PROPOFOL N/A 12/03/2017   Procedure: ESOPHAGOGASTRODUODENOSCOPY (EGD) WITH PROPOFOL;  Surgeon: Manya Silvas, MD;  Location: [facility] ENDOSCOPY;  Service: Endoscopy;  Laterality: N/A;  .  JOINT REPLACEMENT Right 2008   TKR  . NOSE SURGERY    . PARTIAL MASTECTOMY WITH NEEDLE LOCALIZATION Right 09/29/2016   Procedure: PARTIAL MASTECTOMY WITH NEEDLE LOCALIZATION;  Surgeon: [name], MD;  Location: [facility] ORS;  Service: General;  Laterality: Right;  . REPLACEMENT TOTAL KNEE Right 2008  . SENTINEL NODE BIOPSY Right 09/29/2016   Procedure: SENTINEL NODE BIOPSY;  Surgeon: [name], MD;  Location: [facility] ORS;  Service: General;  Laterality: Right;    Family History  Problem Relation Age of Onset  . Hypertension Mother   . Depression Mother   . Heart failure Father   . Obesity Brother   . Heart disease Brother   . Stroke Brother   . Depression Brother   . Breast cancer Cousin   . Breast cancer Cousin   . Breast cancer Cousin     Social History:  reports that she quit smoking about 52 years ago. She has a 10.00 pack-year smoking history. She has never used smokeless tobacco. She reports that she drinks about 7.0 standard drinks of alcohol per week. She reports that she does not use drugs.  She previously smoked 1/2 pack/day x 10 years.  She stopped smoking on 10/11/1978.  She drinks 6 glasses of wine/week.  She has 3 children (2 sons and 1 daughter).  She is a retired Clinical cytogeneticist for [employer].  She likes to do crossword puzzles.  Her daughter's name is [name].  Her husband is in an assisted living facility [facility]) secondary to dementia.  She lives in [place].  She is alone today.  Allergies:  Allergies  Allergen Reactions  . Sulfamethoxazole Other (See Comments)    Anxiety and imsomnia  . Statins Other (See Comments)    Muscle pain   . Trazodone Other (See Comments)    Pt states she felt "unsteady"   . Ciprofloxacin Anxiety  . Sulfa Antibiotics Anxiety    Current Medications: Current Outpatient Medications  Medication Sig Dispense Refill  . amLODipine (NORVASC) 10 MG tablet Take 10 mg by mouth daily.    Marland Kitchen buPROPion (WELLBUTRIN XL) 300 MG 24 hr  tablet Take 300 mg by mouth daily.    . busPIRone (BUSPAR) 10 MG tablet Take 10 mg by mouth 3 (three) times daily.    . calcium carbonate 1250 MG capsule Take 1,250 mg by mouth 2 (two) times daily with a meal.    . Cholecalciferol (VITAMIN D3) 1000 units CAPS Take 1,000 Units by mouth 2 (two) times daily.    Marland Kitchen denosumab (PROLIA) 60 MG/ML SOLN injection Inject 60 mg into the skin every 6 (six) months. Administer in upper arm, thigh, or abdomen    . exemestane (AROMASIN) 25 MG tablet TAKE 1 TABLET (25 MG TOTAL) BY MOUTH DAILY AFTER BREAKFAST. 90 tablet 3  . lisinopril (PRINIVIL,ZESTRIL) 40 MG tablet Take 40 mg by mouth daily.    Marland Kitchen loperamide (IMODIUM) 2 MG capsule Take 2 mg by mouth as needed for diarrhea or  loose stools.     . Magnesium 250 MG TABS Take 2 tablets by mouth daily.    Marland Kitchen omeprazole (PRILOSEC) 40 MG capsule Take 20 mg by mouth.     . rosuvastatin (CRESTOR) 5 MG tablet Take 5 mg by mouth daily.    . sertraline (ZOLOFT) 100 MG tablet Take 1.5 tablets (150 mg total) by mouth daily. (Patient taking differently: Take 100-150 mg by mouth See admin instructions. Takes 100 mg daily except on Monday, Wednesday and Fridays and takes 150 mg (1.5 tablet)) 45 tablet 1  . Turmeric Curcumin 500 MG CAPS Take 1,500 mg by mouth daily.     . vitamin B-12 (CYANOCOBALAMIN) 500 MCG tablet Take 500 mcg by mouth daily.    Marland Kitchen acetaminophen (TYLENOL) 500 MG tablet Take 500 mg by mouth 2 (two) times daily.    Marland Kitchen acetaminophen-codeine (TYLENOL #3) 300-30 MG tablet Take 1 tablet by mouth every 6 (six) hours as needed for moderate pain. (Patient not taking: Reported on 01/30/2018) 10 tablet 0   No current facility-administered medications for this visit.     Review of Systems  Constitutional: Positive for weight loss (up 4 pounds). Negative for diaphoresis, fever and malaise/fatigue.       "I am feeling better these days". More energy.  HENT: Negative.   Eyes: Negative.   Respiratory: Negative for cough,  hemoptysis, sputum production and shortness of breath.   Cardiovascular: Negative for chest pain, palpitations, orthopnea, leg swelling and PND.  Gastrointestinal: Negative for abdominal pain, blood in stool, constipation, diarrhea, melena, nausea and vomiting.       Rectal leakage at times.   Genitourinary: Negative for dysuria, frequency, hematuria and urgency.  Musculoskeletal: Positive for falls and joint pain (knees; improved following steroid injections). Negative for back pain and myalgias.  Skin: Negative for itching and rash.  Neurological: Negative for dizziness, tremors, weakness and headaches.       Poor balance.  Endo/Heme/Allergies: Does not bruise/bleed easily.  Psychiatric/Behavioral: Positive for depression. Negative for memory loss and suicidal ideas. The patient is not nervous/anxious and does not have insomnia.   All other systems reviewed and are negative.  Performance status (ECOG): 0 - Asymptomatic  Vital Signs BP 120/79 (BP Location: Left Arm, Patient Position: Sitting)   Pulse 93   Temp (!) 95.9 F (35.5 C) (Tympanic)   Resp 18   Wt 208 lb 12.4 oz (94.7 kg)   BMI 34.74 kg/m   Physical Exam  Constitutional: She is oriented to person, place, and time and well-developed, well-nourished, and in no distress. No distress.  HENT:  Head: Normocephalic and atraumatic.  Mouth/Throat: Oropharynx is clear and moist and mucous membranes are normal. No oropharyngeal exudate.  Short brown hair.  Eyes: Pupils are equal, round, and reactive to light. Conjunctivae and EOM are normal. No scleral icterus.  Neck: Normal range of motion. Neck supple. No JVD present.  Cardiovascular: Normal rate, regular rhythm, normal heart sounds and intact distal pulses. Exam reveals no gallop and no friction rub.  No murmur heard. Pulmonary/Chest: Effort normal and breath sounds normal. No respiratory distress. She has no wheezes. She has no rales. Right breast exhibits skin change (inferior  edema. healed surgical incision 10-11 o'clock). Right breast exhibits no inverted nipple, no mass, no nipple discharge and no tenderness. Left breast exhibits no inverted nipple, no mass, no nipple discharge, no skin change and no tenderness.  Abdominal: Soft. Bowel sounds are normal. She exhibits no distension and no mass. There  is no abdominal tenderness. There is no rebound and no guarding.  Musculoskeletal: Normal range of motion. She exhibits no edema or tenderness.  Lymphadenopathy:    She has no cervical adenopathy.    She has no axillary adenopathy.       Right: No inguinal and no supraclavicular adenopathy present.       Left: No inguinal and no supraclavicular adenopathy present.  Neurological: She is alert and oriented to person, place, and time. Gait normal.  Skin: Skin is warm and dry. No rash noted. She is not diaphoretic. No erythema.  Psychiatric: Mood, affect and judgment normal.  Nursing note and vitals reviewed.   Appointment on 01/29/2018  Component Date Value Ref Range Status  . Ferritin 01/29/2018 64  11 - 307 ng/mL Final   Performed at [facility], [address] [place], [place] [address]  . WBC 01/29/2018 7.3  4.0 - 10.5 K/uL Final  . RBC 01/29/2018 4.16  3.87 - 5.11 MIL/uL Final  . Hemoglobin 01/29/2018 12.7  12.0 - 15.0 g/dL Final  . HCT 01/29/2018 38.9  36.0 - 46.0 % Final  . MCV 01/29/2018 93.5  80.0 - 100.0 fL Final  . MCH 01/29/2018 30.5  26.0 - 34.0 pg Final  . MCHC 01/29/2018 32.6  30.0 - 36.0 g/dL Final  . RDW 01/29/2018 15.7* 11.5 - 15.5 % Final  . Platelets 01/29/2018 214  150 - 400 K/uL Final  . nRBC 01/29/2018 0.0  0.0 - 0.2 % Final  . Neutrophils Relative % 01/29/2018 56  % Final  . Neutro Abs 01/29/2018 4.2  1.7 - 7.7 K/uL Final  . Lymphocytes Relative 01/29/2018 28  % Final  . Lymphs Abs 01/29/2018 2.0  0.7 - 4.0 K/uL Final  . Monocytes Relative 01/29/2018 12  % Final  . Monocytes Absolute 01/29/2018 0.9  0.1 - 1.0 K/uL Final  .  Eosinophils Relative 01/29/2018 2  % Final  . Eosinophils Absolute 01/29/2018 0.1  0.0 - 0.5 K/uL Final  . Basophils Relative 01/29/2018 1  % Final  . Basophils Absolute 01/29/2018 0.1  0.0 - 0.1 K/uL Final  . Immature Granulocytes 01/29/2018 1  % Final  . Abs Immature Granulocytes 01/29/2018 0.05  0.00 - 0.07 K/uL Final   Performed at [facility], [address] [place], [place] [address]  . Vitamin B-12 01/29/2018 888  180 - 914 pg/mL Final   Comment: (NOTE) This assay is not validated for testing neonatal or myeloproliferative syndrome specimens for Vitamin B12 levels. Performed at [facility], [place] [address] [place],  [address]     Assessment:  NICK ARMEL is a 77 y.o. female with multi-focal right breast cancer s/p partial mastectomy with sentinel lymph node biopsy on 09/29/2016.  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  Three sentinel lymph nodes were negative.  Tumor was ER + (> 90%), PR + (> 90%) and Her2/neu 2+ (negative by FISH).  Pathologic stage was T1c(m) N0(sn).  CA27.29 was 13.4 on 09/06/2016.  MammaPrint revealed a 29% risk of recurrence (high).  She was seen for second opinion at [facility] on 10/25/2016.  Chemotherapy was not recommended.  Bilateral screening mammogram on 07/24/2016 revealed a possible area of distortion in the right breast. Diagnostic mammogram on 08/10/2016 revealed 2 suspicious masses in the upper-outer quadrant of the right breast in a broad area of confluent distortion in the  upper-outer quadrant of the right breast.  There are at least 3 areas of confluent distortion spanning approximately 6.5 cm of tissue.  Ultrasound on 07/31/2016 revealed  an irregular hypoechoic 1.2 x 1.1 x 1.2 cm at the 9:30 position 7 cm from the nipple.  In addition, there was an irregular 1.9 x 0.8 x 1.2 cm mass at the 10 o'clock position 5 cm  from the nipple.  There were multiple small normal-appearing lymph nodes.   Breast MRI on 09/16/2016 revealed an irregular 1.6 x 1.2 x 1.6 cm enhancing mass adjacent to the clip artifact within the upper, outer right breast, middle depth, corresponding to the mass seen at 10 o'clock. There was an additional irregular enhancing mass adjacent to the more posterior clip artifact measuring 2.6 x 1.4 x 1.6 cm, corresponding  to the mass seen at 9:30 position. Together, these 2 masses span 6.3 cm of breast tissue from anterior to posterior. There was no definite additional suspicious area of enhancement within the right breast to correspond to the third area of distortion identified on diagnostic mammogram dated 08/10/2016.  Left breast revealed no suspicious mass or enhancement.  Bilateral mammogram on 08/28/2017 revealed no evidence of malignancy.  She received radiation from 11/15/2016 - 01/08/2017.  She was Femara (01/17/2017 - 05/16/2017) secondary to arthralgias.  She discontinued Arimidex after 19 days (last 06/14/2017) secondary to fatigue and polyarthralgias.  She began Aromasin on 06/27/2017.  CA27.29 has been followed:  13.4 on 09/06/2016, 13.0 on 05/16/2017, and 17.1 on 08/29/2017.  Bone density study on 09/30/2015 revealed osteopenia with a T-score of -2.1 in the right femoral neck.  Bone density study on 10/02/2017 that revealed a T-score of -3.1 consistent with osteoporosis. She began Prolia on 02/14/2017 (last 08/29/2017).  She was diagnosed with iron deficiency anemia and B12 deficiency on 08/29/2017.  TSH and folate were normal.  She is on oral B12 500 mcg a day.  Intrinsic factor and anti-parietal cell antibodies were normal on 01/02/2018.  B12 was 888 on 01/29/2018.  She received Venofer weekly x 3 (10/31/2017 - 11/13/2017).  Ferritin has been followed: 10 on 08/29/2017, 174 on 12/12/2017, 74 on 01/02/2018, and 64 on 01/29/2018.  EGD and colonoscopy on 12/03/2017. Polyps were noted.  Pathology was negative for dysplasia and malignancy.   Symptomatically, she is feeling "better these day". Exertional shortness of breath has improved. Knee pain also improved following steroid injections.  She denies any B symptoms or interval infections.  No bleeding or increased bruising noted.  Exam is grossly unremarkable.  WBC 7300 ([place] 4200).  Ferritin stable at 64 ng/mL.  B12 stable at 888 pg/mL.     Plan: 1. Review labs from yesterday.  Hemoglobin, MCV, ferritin, and B12 are normal. 2. Multifocal RIGHT breast cancer  Doing well overall.  No breast concerns.  Continues on endocrine therapy (exemestane) as previously prescribed. 3. Iron deficiency anemia  Labs reviewed.  No anemia or microcytosis.  Ferritin is stable at 64 ng/mL.  No IV iron replacement needed today. 4. Osteoporosis  Continues on daily calcium 1200 mg vitamin D 800 IU supplementation.  Continue Prolia every 6 months (due 02/28/2018). 5. B12 deficiency  B12 level stable at 888.  Continues on oral B12 1000 mcg supplementation daily. 6. RTC on 02/28/2018 for labs (BMP) and Prolia. 7. RTC in 4 months for MD assessment, labs (CBC with diff, ferritin, CA27.29 - day before), and +/- Venofer.    Honor Loh, NP  01/30/18, 2:18 PM  I saw  and evaluated the patient, participating in the key portions of the service and reviewing pertinent diagnostic studies and records.  I reviewed the nurse practitioner's note and agree with the findings and the plan.  The assessment and plan were discussed with the patient.  Several questions were asked by the patient and answered.   Nolon Stalls, MD 01/30/2018,2:18 PM

## 2018-01-30 NOTE — Progress Notes (Signed)
Patient states she fell out of her bed about a month ago striking her right ribs on her bedside table.  Patient went to Urgent Care, where she had an x-ray that showed no fractures.  She was diagnosed with bruised ribs.  Offers no complaints today.

## 2018-02-06 ENCOUNTER — Ambulatory Visit
Admission: EM | Admit: 2018-02-06 | Discharge: 2018-02-06 | Disposition: A | Discharge status: Home / Self Care | Payer: Medicare HMO | Attending: Family Medicine | Admitting: Family Medicine

## 2018-02-06 ENCOUNTER — Other Ambulatory Visit: Payer: Self-pay

## 2018-02-06 DIAGNOSIS — J020 Streptococcal pharyngitis: Secondary | ICD-10-CM

## 2018-02-06 DIAGNOSIS — I10 Essential (primary) hypertension: Secondary | ICD-10-CM | POA: Diagnosis not present

## 2018-02-06 LAB — RAPID STREP SCREEN (MED CTR MEBANE ONLY): Streptococcus, Group A Screen (Direct): POSITIVE — AB

## 2018-02-06 MED ORDER — LIDOCAINE VISCOUS HCL 2 % MT SOLN: OROMUCOSAL | 0 refills | Status: DC

## 2018-02-06 MED ORDER — PENICILLIN V POTASSIUM 500 MG PO TABS: 500.0000 mg | ORAL_TABLET | Freq: Three times a day (TID) | ORAL | 0 refills | Status: DC

## 2018-02-06 NOTE — ED Provider Notes (Signed)
[facility]    CSN: [id number] Arrival date & time: 02/06/18  3419     History   Chief Complaint Chief Complaint  Patient presents with  . Sore Throat    HPI Allison Hall is a 77 y.o. female.   The history is provided by the patient.  Sore Throat  This is a new problem. The current episode started 6 to 12 hours ago. The problem occurs constantly. The problem has not changed since onset.Pertinent negatives include no chest pain, no abdominal pain, no headaches and no shortness of breath. The symptoms are aggravated by swallowing. She has tried nothing for the symptoms.    Past Medical History:  Diagnosis Date  . Anxiety   . Breast cancer ([place]) 08/24/2016   right  . Cancer ([place])    skin ca  . Colon polyp   . Depression   . GERD (gastroesophageal reflux disease)   . Hypercholesteremia   . Hypertension   . Osteoarthritis   . Personal history of radiation therapy   . Thyroid nodule 04/17/2016    Patient Active Problem List   Diagnosis Date Noted  . Iron deficiency anemia 10/31/2017  . B12 deficiency 10/31/2017  . Arthralgia 06/03/2017  . Osteoporosis 10/04/2016  . Breast cancer of upper-outer quadrant of right female breast ([place]) 08/24/2016  . Closed fracture of distal end of ulna 06/01/2015  . Triggering of digit 05/27/2014  . Hamstring muscle strain 05/26/2014  . Arthritis of knee, degenerative 03/26/2014  . Long term current use of opiate analgesic 04/21/2013  . Arthritis, degenerative 04/21/2013  . Anxiety 10/05/2011  . Colon polyp 10/05/2011  . Esophageal foreign body 10/05/2011  . Acid reflux 10/05/2011  . BP (high blood pressure) 10/05/2011  . Hypercholesterolemia 10/05/2011    Past Surgical History:  Procedure Laterality Date  . ABDOMINAL HYSTERECTOMY    . APPENDECTOMY    . BREAST BIOPSY Right 08/24/2016   9:30 - invasive mammary carcinoma with lobular features  . BREAST BIOPSY Right 08/24/2016   10:00 - invasive mammary  carcinoma with lobular features  . BREAST LUMPECTOMY Right 10/09/2016  . COLONOSCOPY W/ POLYPECTOMY  2003, 2008, 2014  . COLONOSCOPY WITH PROPOFOL N/A 12/03/2017   Procedure: COLONOSCOPY WITH PROPOFOL;  Surgeon: Manya Silvas, MD;  Location: [facility] ENDOSCOPY;  Service: Endoscopy;  Laterality: N/A;  . ESOPHAGOGASTRODUODENOSCOPY     foreign body  . ESOPHAGOGASTRODUODENOSCOPY (EGD) WITH PROPOFOL N/A 12/03/2017   Procedure: ESOPHAGOGASTRODUODENOSCOPY (EGD) WITH PROPOFOL;  Surgeon: Manya Silvas, MD;  Location: [facility] ENDOSCOPY;  Service: Endoscopy;  Laterality: N/A;  . JOINT REPLACEMENT Right 2008   TKR  . NOSE SURGERY    . PARTIAL MASTECTOMY WITH NEEDLE LOCALIZATION Right 09/29/2016   Procedure: PARTIAL MASTECTOMY WITH NEEDLE LOCALIZATION;  Surgeon: [name], MD;  Location: [facility] ORS;  Service: General;  Laterality: Right;  . REPLACEMENT TOTAL KNEE Right 2008  . SENTINEL NODE BIOPSY Right 09/29/2016   Procedure: SENTINEL NODE BIOPSY;  Surgeon: [name], MD;  Location: [facility] ORS;  Service: General;  Laterality: Right;    OB History   None      Home Medications    Prior to Admission medications   Medication Sig Start Date End Date Taking? Authorizing Provider  acetaminophen (TYLENOL) 500 MG tablet Take 500 mg by mouth 2 (two) times daily.   Yes [provider]  amLODipine (NORVASC) 10 MG tablet Take 10 mg by mouth daily. 09/20/17 09/20/18 Yes [provider]  buPROPion [facility]  XL) 300 MG 24 hr tablet Take 300 mg by mouth daily. 10/23/17 10/23/18 Yes [provider]  busPIRone (BUSPAR) 10 MG tablet Take 10 mg by mouth 3 (three) times daily. 04/17/16  Yes [provider]  calcium carbonate 1250 MG capsule Take 1,250 mg by mouth 2 (two) times daily with a meal.   Yes [provider]  Cholecalciferol (VITAMIN D3) 1000 units CAPS Take 1,000 Units by mouth 2 (two) times daily.   Yes [provider]  denosumab (PROLIA) 60  MG/ML SOLN injection Inject 60 mg into the skin every 6 (six) months. Administer in upper arm, thigh, or abdomen   Yes [provider]  exemestane (AROMASIN) 25 MG tablet TAKE 1 TABLET (25 MG TOTAL) BY MOUTH DAILY AFTER BREAKFAST. 07/20/17  Yes Karen Kitchens, NP  lisinopril (PRINIVIL,ZESTRIL) 40 MG tablet Take 40 mg by mouth daily. 09/20/17 09/20/18 Yes [provider]  loperamide (IMODIUM) 2 MG capsule Take 2 mg by mouth as needed for diarrhea or loose stools.    Yes [provider]  Magnesium 250 MG TABS Take 2 tablets by mouth daily.   Yes [provider]  omeprazole (PRILOSEC) 40 MG capsule Take 20 mg by mouth.  10/20/14 02/06/18 Yes [provider]  rosuvastatin (CRESTOR) 5 MG tablet Take 5 mg by mouth daily. 04/17/16 02/06/18 Yes [provider]  sertraline (ZOLOFT) 100 MG tablet Take 1.5 tablets (150 mg total) by mouth daily. Patient taking differently: Take 100-150 mg by mouth See admin instructions. Takes 100 mg daily except on Monday, Wednesday and Fridays and takes 150 mg (1.5 tablet) 05/19/15 02/06/18 Yes Ravi, Himabindu, MD  Turmeric Curcumin 500 MG CAPS Take 1,500 mg by mouth daily.    Yes [provider]  vitamin B-12 (CYANOCOBALAMIN) 500 MCG tablet Take 500 mcg by mouth daily.   Yes [provider]  acetaminophen-codeine (TYLENOL #3) 300-30 MG tablet Take 1 tablet by mouth every 6 (six) hours as needed for moderate pain. 01/03/18   Lorin Picket, PA-C  lidocaine (XYLOCAINE) 2 % solution 20 ml gargle and spit q 6 hours prn sore throat 02/06/18   Norval Gable, MD  penicillin v potassium (VEETID) 500 MG tablet Take 1 tablet (500 mg total) by mouth 3 (three) times daily. 02/06/18   Norval Gable, MD    Family History Family History  Problem Relation Age of Onset  . Hypertension Mother   . Depression Mother   . Heart failure Father   . Obesity Brother   . Heart disease Brother   . Stroke Brother   .  Depression Brother   . Breast cancer Cousin   . Breast cancer Cousin   . Breast cancer Cousin     Social History Social History   Tobacco Use  . Smoking status: Former Smoker    Packs/day: 1.00    Years: 10.00    Pack years: 10.00    Last attempt to quit: 05/18/1965    Years since quitting: 52.7  . Smokeless tobacco: Never Used  Substance Use Topics  . Alcohol use: Yes    Alcohol/week: 7.0 standard drinks    Types: 7 Glasses of wine per week  . Drug use: No     Allergies   Sulfamethoxazole; Statins; Trazodone; Ciprofloxacin; and Sulfa antibiotics   Review of Systems Review of Systems  Respiratory: Negative for shortness of breath.   Cardiovascular: Negative for chest pain.  Gastrointestinal: Negative for abdominal pain.  Neurological: Negative for headaches.  Physical Exam Triage Vital Signs ED Triage Vitals  Enc Vitals Group     BP 02/06/18 0939 (!) 153/78     Pulse Rate 02/06/18 0939 93     Resp 02/06/18 0939 18     Temp 02/06/18 0939 98.6 F (37 C)     Temp Source 02/06/18 0939 Oral     SpO2 02/06/18 0939 99 %     Weight 02/06/18 0938 208 lb 12.4 oz (94.7 kg)     Height 02/06/18 0938 5\' 5"  (1.651 m)     Head Circumference --      Peak Flow --      Pain Score 02/06/18 0938 0     Pain Loc --      Pain Edu? --      Excl. in [place]? --    No data found.  Updated Vital Signs BP (!) 153/78 (BP Location: Left Arm)   Pulse 93   Temp 98.6 F (37 C) (Oral)   Resp 18   Ht 5\' 5"  (1.651 m)   Wt 94.7 kg   SpO2 99%   BMI 34.74 kg/m   Visual Acuity Right Eye Distance:   Left Eye Distance:   Bilateral Distance:    Right Eye Near:   Left Eye Near:    Bilateral Near:     Physical Exam  Constitutional: She appears well-developed and well-nourished. No distress.  HENT:  Head: Normocephalic and atraumatic.  Nose: No mucosal edema, rhinorrhea, nose lacerations, sinus tenderness, nasal deformity, septal deviation or nasal septal hematoma. No epistaxis.   No foreign bodies.  Mouth/Throat: Uvula is midline and mucous membranes are normal. Oropharyngeal exudate and posterior oropharyngeal erythema present. No posterior oropharyngeal edema or tonsillar abscesses. No tonsillar exudate.  Eyes: No scleral icterus.  Cardiovascular: Normal rate.  Pulmonary/Chest: Effort normal. No respiratory distress.  Lymphadenopathy:    She has no cervical adenopathy.  Skin: She is not diaphoretic.  Nursing note and vitals reviewed.    UC Treatments / Results  Labs (all labs ordered are listed, but only abnormal results are displayed) Labs Reviewed  RAPID STREP SCREEN ([facility] ONLY) - Abnormal; Notable for the following components:      Result Value   Streptococcus, Group A Screen (Direct) POSITIVE (*)    All other components within normal limits    EKG None  Radiology No results found.  Procedures Procedures (including critical care time)  Medications Ordered in UC Medications - No data to display  Initial Impression / Assessment and Plan / UC Course  I have reviewed the triage vital signs and the nursing notes.  Pertinent labs & imaging results that were available during my care of the patient were reviewed by me and considered in my medical decision making (see chart for details).      Final Clinical Impressions(s) / UC Diagnoses   Final diagnoses:  Strep pharyngitis    ED Prescriptions    Medication Sig Dispense Auth. Provider   penicillin v potassium (VEETID) 500 MG tablet Take 1 tablet (500 mg total) by mouth 3 (three) times daily. 30 tablet Adian Jablonowski, Linward Foster, MD   lidocaine (XYLOCAINE) 2 % solution 20 ml gargle and spit q 6 hours prn sore throat 100 mL Norval Gable, MD      1. Lab results and diagnosis reviewed with patient 2. rx as per orders above; reviewed possible side effects, interactions, risks and benefits  3. Recommend supportive treatment with otc analgesics prn 4. Follow-up prn if  symptoms worsen or don't  improve  Controlled Substance Prescriptions [place] Controlled Substance Registry consulted? Not Applicable   Norval Gable, MD 02/06/18 1216

## 2018-02-06 NOTE — ED Triage Notes (Signed)
Patient complains of Sore throat and coughing that started in the middle of last night. Patient states that she tried gargling with salt water.

## 2018-02-27 ENCOUNTER — Encounter: Payer: Self-pay | Admitting: Student in an Organized Health Care Education/Training Program

## 2018-02-27 ENCOUNTER — Ambulatory Visit
Admission: RE | Admit: 2018-02-27 | Discharge: 2018-02-27 | Disposition: A | Discharge status: Home / Self Care | Payer: Medicare HMO | Source: Ambulatory Visit | Attending: Student in an Organized Health Care Education/Training Program | Admitting: Student in an Organized Health Care Education/Training Program

## 2018-02-27 ENCOUNTER — Ambulatory Visit (HOSPITAL_BASED_OUTPATIENT_CLINIC_OR_DEPARTMENT_OTHER): Payer: Medicare HMO | Admitting: Student in an Organized Health Care Education/Training Program

## 2018-02-27 ENCOUNTER — Other Ambulatory Visit: Payer: Self-pay

## 2018-02-27 VITALS — BP 154/86 | HR 99 | Temp 98.5°F | Resp 16 | Ht 65.0 in | Wt 200.0 lb

## 2018-02-27 DIAGNOSIS — M1712 Unilateral primary osteoarthritis, left knee: Secondary | ICD-10-CM

## 2018-02-27 DIAGNOSIS — G8929 Other chronic pain: Secondary | ICD-10-CM

## 2018-02-27 DIAGNOSIS — G894 Chronic pain syndrome: Secondary | ICD-10-CM | POA: Diagnosis not present

## 2018-02-27 DIAGNOSIS — Z888 Allergy status to other drugs, medicaments and biological substances status: Secondary | ICD-10-CM | POA: Insufficient documentation

## 2018-02-27 DIAGNOSIS — M25562 Pain in left knee: Secondary | ICD-10-CM

## 2018-02-27 DIAGNOSIS — Z882 Allergy status to sulfonamides status: Secondary | ICD-10-CM | POA: Diagnosis not present

## 2018-02-27 DIAGNOSIS — Z881 Allergy status to other antibiotic agents status: Secondary | ICD-10-CM | POA: Diagnosis not present

## 2018-02-27 DIAGNOSIS — Z79899 Other long term (current) drug therapy: Secondary | ICD-10-CM | POA: Insufficient documentation

## 2018-02-27 MED ORDER — DEXAMETHASONE SODIUM PHOSPHATE 10 MG/ML IJ SOLN
INTRAMUSCULAR | Status: AC
  Filled 2018-02-27: qty 1

## 2018-02-27 MED ORDER — ROPIVACAINE HCL 2 MG/ML IJ SOLN
10.0000 mL | Freq: Once | INTRAMUSCULAR | Status: AC
  Administered 2018-02-27: 10 mL

## 2018-02-27 MED ORDER — LIDOCAINE HCL 2 % IJ SOLN
20.0000 mL | Freq: Once | INTRAMUSCULAR | Status: AC
  Administered 2018-02-27: 400 mg

## 2018-02-27 MED ORDER — LIDOCAINE HCL 2 % IJ SOLN
INTRAMUSCULAR | Status: AC
  Filled 2018-02-27: qty 20

## 2018-02-27 MED ORDER — DEXAMETHASONE SODIUM PHOSPHATE 10 MG/ML IJ SOLN
10.0000 mg | Freq: Once | INTRAMUSCULAR | Status: AC
  Administered 2018-02-27: 10 mg

## 2018-02-27 MED ORDER — ROPIVACAINE HCL 2 MG/ML IJ SOLN
INTRAMUSCULAR | Status: AC
  Filled 2018-02-27: qty 10

## 2018-02-27 NOTE — Patient Instructions (Signed)

## 2018-02-27 NOTE — Progress Notes (Signed)
Safety precautions to be maintained throughout the outpatient stay will include: orient to surroundings, keep bed in low position, maintain call bell within reach at all times, provide assistance with transfer out of bed and ambulation.  

## 2018-02-27 NOTE — Progress Notes (Signed)
Patient's Name: Allison Hall  MRN: [id number]  Referring Provider: Gillis Santa, MD  DOB: 12-08-1940  PCP: Clarisse Gouge, MD  DOS: 02/27/2018  Note by: Gillis Santa, MD  Service setting: Ambulatory outpatient  Specialty: Interventional Pain Management  Patient type: Established  Location: [facility] (AMB) Pain Management Facility  Visit type: Interventional Procedure   Primary Reason for Visit: Interventional Pain Management Treatment. CC: Knee Pain (left) and Leg Pain (left)  Procedure:          Anesthesia, Analgesia, Anxiolysis:  Type: Genicular Nerves Block (Superior-lateral, Superior-medial, and Inferior-medial Genicular Nerves) #1  CPT: 47829      Primary Purpose: Diagnostic Region: Lateral, Anterior, and Medial aspects of the knee joint, above and below the knee joint proper. Level: Superior and inferior to the knee joint. Target Area: For Genicular Nerve block(s), the targets are: the superior-lateral genicular nerve, located in the lateral distal portion of the femoral shaft as it curves to form the lateral epicondyle, in the region of the distal femoral metaphysis; the superior-medial genicular nerve, located in the medial distal portion of the femoral shaft as it curves to form the medial epicondyle; and the inferior-medial genicular nerve, located in the medial, proximal portion of the tibial shaft, as it curves to form the medial epicondyle, in the region of the proximal tibial metaphysis. Approach: Anterior, percutaneous, ipsilateral approach. Laterality: Left knee  Type: Local Anesthesia Indication(s): Analgesia         Route: Infiltration (Soso/IM) IV Access: Declined Sedation: Declined  Local Anesthetic: Lidocaine 1-2%  Position: Modified Fowler's position with pillows under the targeted knee(s).   Indications: 1. Chronic pain of left knee   2. Primary osteoarthritis of left knee   3. Chronic pain syndrome    Pain Score: Pre-procedure: 0-No pain/10 Post-procedure:  0-No pain/10  Pre-op Assessment:  Allison Hall is a 77 y.o. (year old), female patient, seen today for interventional treatment. She  has a past surgical history that includes Abdominal hysterectomy; Appendectomy; Nose surgery; Replacement total knee (Right, 2008); Partial mastectomy with needle localization (Right, 09/29/2016); Sentinel node biopsy (Right, 09/29/2016); Breast lumpectomy (Right, 10/09/2016); Breast biopsy (Right, 08/24/2016); Breast biopsy (Right, 08/24/2016); Colonoscopy w/ polypectomy (2003, 2008, 2014); Esophagogastroduodenoscopy; Joint replacement (Right, 2008); Colonoscopy with propofol (N/A, 12/03/2017); and Esophagogastroduodenoscopy (egd) with propofol (N/A, 12/03/2017). Allison Hall has a current medication list which includes the following prescription(s): acetaminophen, amlodipine, bupropion, buspirone, calcium carbonate, vitamin d3, denosumab, exemestane, lidocaine, lisinopril, loperamide, magnesium, omeprazole, penicillin v potassium, rosuvastatin, sertraline, turmeric curcumin, and vitamin b-12. Her primarily concern today is the Knee Pain (left) and Leg Pain (left)  Initial Vital Signs:  Pulse/HCG Rate: 99ECG Heart Rate: 98 Temp: 98.5 F (36.9 C) Resp: 18 BP: (!) 149/89 SpO2: 100 %  BMI: Estimated body mass index is 33.28 kg/m as calculated from the following:   Height as of this encounter: 5\' 5"  (1.651 m).   Weight as of this encounter: 200 lb (90.7 kg).  Risk Assessment: Allergies: Reviewed. She is allergic to sulfamethoxazole; statins; trazodone; ciprofloxacin; and sulfa antibiotics.  Allergy Precautions: None required Coagulopathies: Reviewed. None identified.  Blood-thinner therapy: None at this time Active Infection(s): Reviewed. None identified. Allison Hall is afebrile  Site Confirmation: Allison Hall was asked to confirm the procedure and laterality before marking the site Procedure checklist: Completed Consent: Before the procedure and under the  influence of no sedative(s), amnesic(s), or anxiolytics, the patient was informed of the treatment options, risks and possible complications. To fulfill our ethical and legal  obligations, as recommended by the American Medical Association's Code of Ethics, I have informed the patient of my clinical impression; the nature and purpose of the treatment or procedure; the risks, benefits, and possible complications of the intervention; the alternatives, including doing nothing; the risk(s) and benefit(s) of the alternative treatment(s) or procedure(s); and the risk(s) and benefit(s) of doing nothing. The patient was provided information about the general risks and possible complications associated with the procedure. These may include, but are not limited to: failure to achieve desired goals, infection, bleeding, organ or nerve damage, allergic reactions, paralysis, and death. In addition, the patient was informed of those risks and complications associated to the procedure, such as failure to decrease pain; infection; bleeding; organ or nerve damage with subsequent damage to sensory, motor, and/or autonomic systems, resulting in permanent pain, numbness, and/or weakness of one or several areas of the body; allergic reactions; (i.e.: anaphylactic reaction); and/or death. Furthermore, the patient was informed of those risks and complications associated with the medications. These include, but are not limited to: allergic reactions (i.e.: anaphylactic or anaphylactoid reaction(s)); adrenal axis suppression; blood sugar elevation that in diabetics may result in ketoacidosis or comma; water retention that in patients with history of congestive heart failure may result in shortness of breath, pulmonary edema, and decompensation with resultant heart failure; weight gain; swelling or edema; medication-induced neural toxicity; particulate matter embolism and blood vessel occlusion with resultant organ, and/or nervous system  infarction; and/or aseptic necrosis of one or more joints. Finally, the patient was informed that Medicine is not an exact science; therefore, there is also the possibility of unforeseen or unpredictable risks and/or possible complications that may result in a catastrophic outcome. The patient indicated having understood very clearly. We have given the patient no guarantees and we have made no promises. Enough time was given to the patient to ask questions, all of which were answered to the patient's satisfaction. Ms. Rizzolo has indicated that she wanted to continue with the procedure. Attestation: I, the ordering provider, attest that I have discussed with the patient the benefits, risks, side-effects, alternatives, likelihood of achieving goals, and potential problems during recovery for the procedure that I have provided informed consent. Date  Time: 02/27/2018  9:59 AM  Pre-Procedure Preparation:  Monitoring: As per clinic protocol. Respiration, ETCO2, SpO2, BP, heart rate and rhythm monitor placed and checked for adequate function Safety Precautions: Patient was assessed for positional comfort and pressure points before starting the procedure. Time-out: I initiated and conducted the "Time-out" before starting the procedure, as per protocol. The patient was asked to participate by confirming the accuracy of the "Time Out" information. Verification of the correct person, site, and procedure were performed and confirmed by me, the nursing staff, and the patient. "Time-out" conducted as per Joint Commission's Universal Protocol (UP.01.01.01). Time: 1029  Description of Procedure:          Area Prepped: Entire knee area, from mid-thigh to mid-shin, lateral, anterior, and medial aspects. Prepping solution: ChloraPrep (2% chlorhexidine gluconate and 70% isopropyl alcohol) Safety Precautions: Aspiration looking for blood return was conducted prior to all injections. At no point did we inject any  substances, as a needle was being advanced. No attempts were made at seeking any paresthesias. Safe injection practices and needle disposal techniques used. Medications properly checked for expiration dates. SDV (single dose vial) medications used. Description of the Procedure: Protocol guidelines were followed. The patient was placed in position over the procedure table. The target area was identified  and the area prepped in the usual manner. Skin & deeper tissues infiltrated with local anesthetic. Appropriate amount of time allowed to pass for local anesthetics to take effect. The procedure needles were then advanced to the target area. Proper needle placement secured. Negative aspiration confirmed. Solution injected in intermittent fashion, asking for systemic symptoms every 0.5cc of injectate. The needles were then removed and the area cleansed, making sure to leave some of the prepping solution back to take advantage of its long term bactericidal properties.  Vitals:   02/27/18 1004 02/27/18 1029 02/27/18 1034 02/27/18 1043  BP: (!) 149/89 123/72 136/79 (!) 154/86  Pulse: 99     Resp: 18 18 10 16   Temp: 98.5 F (36.9 C)     TempSrc: Oral     SpO2: 100% 97% 98% 97%  Weight: 200 lb (90.7 kg)     Height: 5\' 5"  (1.651 m)       Start Time: 1029 hrs. End Time: 1036 hrs. Materials:  Needle(s) Type: Spinal Needle Gauge: 25G Length: 3.5-in Medication(s): Please see orders for medications and dosing details. 5 cc solution made of 4 cc of 0.2% prilocaine, 1 cc of Decadron 10 mg/cc.  1.5 cc injected at each level above. Imaging Guidance (Non-Spinal):          Type of Imaging Technique: Fluoroscopy Guidance (Non-Spinal) Indication(s): Assistance in needle guidance and placement for procedures requiring needle placement in or near specific anatomical locations not easily accessible without such assistance. Exposure Time: Please see nurses notes. Contrast: None used. Fluoroscopic Guidance: I was  personally present during the use of fluoroscopy. "Tunnel Vision Technique" used to obtain the best possible view of the target area. Parallax error corrected before commencing the procedure. "Direction-depth-direction" technique used to introduce the needle under continuous pulsed fluoroscopy. Once target was reached, antero-posterior, oblique, and lateral fluoroscopic projection used confirm needle placement in all planes. Images permanently stored in EMR. Interpretation: No contrast injected. I personally interpreted the imaging intraoperatively. Adequate needle placement confirmed in multiple planes. Permanent images saved into the patient's record.  Antibiotic Prophylaxis:   Anti-infectives (From admission, onward)   None     Indication(s): None identified  Post-operative Assessment:  Post-procedure Vital Signs:  Pulse/HCG Rate: 99(!) 104 Temp: 98.5 F (36.9 C) Resp: 16 BP: (!) 154/86 SpO2: 97 %  EBL: None  Complications: No immediate post-treatment complications observed by team, or reported by patient.  Note: The patient tolerated the entire procedure well. A repeat set of vitals were taken after the procedure and the patient was kept under observation following institutional policy, for this type of procedure. Post-procedural neurological assessment was performed, showing return to baseline, prior to discharge. The patient was provided with post-procedure discharge instructions, including a section on how to identify potential problems. Should any problems arise concerning this procedure, the patient was given instructions to immediately contact us, at any time, without hesitation. In any case, we plan to contact the patient by telephone for a follow-up status report regarding this interventional procedure.  Comments:  No additional relevant information.  Plan of Care    Imaging Orders     DG C-Arm 1-60 Min-No Report Procedure Orders    No procedure(s) ordered today     Medications ordered for procedure: Meds ordered this encounter  Medications  . ropivacaine (PF) 2 mg/mL (0.2%) (NAROPIN) injection 10 mL  . lidocaine (XYLOCAINE) 2 % (with pres) injection 400 mg  . dexamethasone (DECADRON) injection 10 mg   Medications administered: We  administered ropivacaine (PF) 2 mg/mL (0.2%), lidocaine, and dexamethasone.  See the medical record for exact dosing, route, and time of administration.  Disposition: Discharge home  Discharge Date & Time: 02/27/2018; 1045 hrs.   Physician-requested Follow-up: Return in about 5 weeks (around 04/03/2018) for Post Procedure Evaluation.  Future Appointments  Date Time Provider [place]  02/28/2018  2:00 PM [facility] None  02/28/2018  2:15 PM [facility] INJECTION [facility] None  04/04/2018 10:30 AM Gillis Santa, MD [facility] None  06/04/2018  2:00 PM [facility] None  06/05/2018  1:30 PM Lequita Asal, MD [facility] None  06/05/2018  2:00 PM [facility] INFUSION CHAIR 4 [facility] None  01/16/2019  2:30 PM Chrystal, Eulas Post, MD [facility] None   Primary Care Physician: Clarisse Gouge, MD Location: [facility] Outpatient [facility] Management Facility Note by: Gillis Santa, MD Date: 02/27/2018; Time: 11:27 AM  Disclaimer:  Medicine is not an exact science. The only guarantee in medicine is that nothing is guaranteed. It is important to note that the decision to proceed with this intervention was based on the information collected from the patient. The Data and conclusions were drawn from the patient's questionnaire, the interview, and the physical examination. Because the information was provided in large part by the patient, it cannot be guaranteed that it has not been purposely or unconsciously manipulated. Every effort has been made to obtain as much relevant data as possible for this evaluation. It is important to note that the conclusions that lead to this procedure are derived in large part from the  available data. Always take into account that the treatment will also be dependent on availability of resources and existing treatment guidelines, considered by other Pain Management Practitioners as being common knowledge and practice, at the time of the intervention. For Medico-Legal purposes, it is also important to point out that variation in procedural techniques and pharmacological choices are the acceptable norm. The indications, contraindications, technique, and results of the above procedure should only be interpreted and judged by a Board-Certified Interventional Pain Specialist with extensive familiarity and expertise in the same exact procedure and technique.

## 2018-02-28 ENCOUNTER — Inpatient Hospital Stay: Payer: Medicare HMO | Attending: Hematology and Oncology

## 2018-02-28 ENCOUNTER — Inpatient Hospital Stay: Payer: Medicare HMO

## 2018-02-28 VITALS — BP 136/72 | HR 88 | Temp 96.7°F | Resp 18

## 2018-02-28 DIAGNOSIS — M810 Age-related osteoporosis without current pathological fracture: Secondary | ICD-10-CM | POA: Insufficient documentation

## 2018-02-28 DIAGNOSIS — C50411 Malignant neoplasm of upper-outer quadrant of right female breast: Secondary | ICD-10-CM

## 2018-02-28 DIAGNOSIS — Z170 Estrogen receptor positive status [ER+]: Principal | ICD-10-CM

## 2018-02-28 LAB — BASIC METABOLIC PANEL
Anion gap: 11 (ref 5–15)
BUN: 41 mg/dL — ABNORMAL HIGH (ref 8–23)
CO2: 25 mmol/L (ref 22–32)
Calcium: 9.5 mg/dL (ref 8.9–10.3)
Chloride: 101 mmol/L (ref 98–111)
Creatinine, Ser: 1.21 mg/dL — ABNORMAL HIGH (ref 0.44–1.00)
GFR calc Af Amer: 50 mL/min — ABNORMAL LOW (ref >60)
GFR calc non Af Amer: 43 mL/min — ABNORMAL LOW (ref >60)
Glucose, Bld: 111 mg/dL — ABNORMAL HIGH (ref 70–99)
Potassium: 3.9 mmol/L (ref 3.5–5.1)
Sodium: 137 mmol/L (ref 135–145)

## 2018-02-28 MED ORDER — DENOSUMAB 60 MG/ML ~~LOC~~ SOSY
60.0000 mg | PREFILLED_SYRINGE | Freq: Once | SUBCUTANEOUS | Status: AC
  Administered 2018-02-28: 60 mg via SUBCUTANEOUS
  Filled 2018-02-28: qty 1

## 2018-03-04 ENCOUNTER — Other Ambulatory Visit: Payer: Self-pay

## 2018-03-04 ENCOUNTER — Encounter: Payer: Self-pay | Admitting: Emergency Medicine

## 2018-03-04 ENCOUNTER — Ambulatory Visit
Admission: EM | Admit: 2018-03-04 | Discharge: 2018-03-04 | Disposition: A | Discharge status: Home / Self Care | Payer: Medicare HMO | Attending: Family Medicine | Admitting: Family Medicine

## 2018-03-04 DIAGNOSIS — J029 Acute pharyngitis, unspecified: Secondary | ICD-10-CM | POA: Diagnosis not present

## 2018-03-04 DIAGNOSIS — J069 Acute upper respiratory infection, unspecified: Secondary | ICD-10-CM | POA: Diagnosis not present

## 2018-03-04 LAB — RAPID STREP SCREEN (MED CTR MEBANE ONLY): Streptococcus, Group A Screen (Direct): NEGATIVE

## 2018-03-04 MED ORDER — GUAIFENESIN-CODEINE 100-10 MG/5ML PO SYRP: 5.0000 mL | ORAL_SOLUTION | Freq: Three times a day (TID) | ORAL | 0 refills | Status: DC | PRN

## 2018-03-04 MED ORDER — BENZONATATE 200 MG PO CAPS: ORAL_CAPSULE | ORAL | 0 refills | Status: DC

## 2018-03-04 NOTE — ED Triage Notes (Signed)
Patient c/o sore throat that started last night. Patient stated she was diagnosed with strep throat on 11/13 and completed her antibiotic and now has started to feel the same symptoms.

## 2018-03-04 NOTE — ED Provider Notes (Signed)
[facility]    CSN: [id number] Arrival date & time: 03/04/18  6834     History   Chief Complaint Chief Complaint  Patient presents with  . Sore Throat    HPI FATIM VANDERSCHAAF is a 77 y.o. female.   HPI  77 year old female presents with a sore throat that started last night was diagnosed with a strep throat on 1113 and completed her antibiotic and Pen-Vee K.  She states that now she has the same feeling as before.  She also has a significant cough and feeling that something is stuck in her throat.  She has run no fever or chills.  She is afebrile at 98.9 O2 sats are 97% on room air.          Past Medical History:  Diagnosis Date  . Anxiety   . Breast cancer ([place]) 08/24/2016   right  . Cancer ([employer])    skin ca  . Colon polyp   . Depression   . GERD (gastroesophageal reflux disease)   . Hypercholesteremia   . Hypertension   . Osteoarthritis   . Personal history of radiation therapy   . Thyroid nodule 04/17/2016    Patient Active Problem List   Diagnosis Date Noted  . Iron deficiency anemia 10/31/2017  . B12 deficiency 10/31/2017  . Arthralgia 06/03/2017  . Osteoporosis 10/04/2016  . Breast cancer of upper-outer quadrant of right female breast ([place]) 08/24/2016  . Closed fracture of distal end of ulna 06/01/2015  . Triggering of digit 05/27/2014  . Hamstring muscle strain 05/26/2014  . Arthritis of knee, degenerative 03/26/2014  . Long term current use of opiate analgesic 04/21/2013  . Arthritis, degenerative 04/21/2013  . Anxiety 10/05/2011  . Colon polyp 10/05/2011  . Esophageal foreign body 10/05/2011  . Acid reflux 10/05/2011  . BP (high blood pressure) 10/05/2011  . Hypercholesterolemia 10/05/2011    Past Surgical History:  Procedure Laterality Date  . ABDOMINAL HYSTERECTOMY    . APPENDECTOMY    . BREAST BIOPSY Right 08/24/2016   9:30 - invasive mammary carcinoma with lobular features  . BREAST BIOPSY Right 08/24/2016   10:00  - invasive mammary carcinoma with lobular features  . BREAST LUMPECTOMY Right 10/09/2016  . COLONOSCOPY W/ POLYPECTOMY  2003, 2008, 2014  . COLONOSCOPY WITH PROPOFOL N/A 12/03/2017   Procedure: COLONOSCOPY WITH PROPOFOL;  Surgeon: Manya Silvas, MD;  Location: [facility] ENDOSCOPY;  Service: Endoscopy;  Laterality: N/A;  . ESOPHAGOGASTRODUODENOSCOPY     foreign body  . ESOPHAGOGASTRODUODENOSCOPY (EGD) WITH PROPOFOL N/A 12/03/2017   Procedure: ESOPHAGOGASTRODUODENOSCOPY (EGD) WITH PROPOFOL;  Surgeon: Manya Silvas, MD;  Location[facility] END[facility]COPY;  Service: Endoscopy;  Laterality: N/A;  . JOINT REPLACEMENT Right 2008   TKR  . NOSE SURGERY    . PARTIAL MASTECTOMY WITH NEEDLE LOCALIZATION Right 09/29/2016   Procedure: PARTIAL MASTECTOMY WITH NEEDLE LOCALIZATION;  Surgeon: [name], MD;  Location: [facility] ORS;  Service: General;  Laterality: Right;  . REPLACEMENT TOTAL KNEE Right 2008  . SENTINEL NODE BIOPSY Right 09/29/2016   Procedure: SENTINEL NODE BIOPSY;  Surgeon: [name], MD;  Location: [facility] ORS;  Service: General;  Laterality: Right;    OB History   None      Home Medications    Prior to Admission medications   Medication Sig Start Date End Date Taking? Authorizing Provider  acetaminophen (TYLENOL) 500 MG tablet Take 500 mg by mouth 2 (two) times daily.   Yes [provider]  amLODipine (  NORVASC) 10 MG tablet Take 10 mg by mouth daily. 09/20/17 09/20/18 Yes [provider]  buPROPion (WELLBUTRIN XL) 300 MG 24 hr tablet Take 300 mg by mouth daily. 10/23/17 10/23/18 Yes [provider]  busPIRone (BUSPAR) 10 MG tablet Take 10 mg by mouth 3 (three) times daily. 04/17/16  Yes [provider]  calcium carbonate 1250 MG capsule Take 1,250 mg by mouth 2 (two) times daily with a meal.   Yes [provider]  Cholecalciferol (VITAMIN D3) 1000 units CAPS Take 1,000 Units by mouth 2 (two) times daily.   Yes [provider]    denosumab (PROLIA) 60 MG/ML SOLN injection Inject 60 mg into the skin every 6 (six) months. Administer in upper arm, thigh, or abdomen   Yes [provider]  exemestane (AROMASIN) 25 MG tablet TAKE 1 TABLET (25 MG TOTAL) BY MOUTH DAILY AFTER BREAKFAST. 07/20/17  Yes Karen Kitchens, NP  lidocaine (XYLOCAINE) 2 % solution 20 ml gargle and spit q 6 hours prn sore throat 02/06/18  Yes Conty, Linward Foster, MD  lisinopril (PRINIVIL,ZESTRIL) 40 MG tablet Take 40 mg by mouth daily. 09/20/17 09/20/18 Yes [provider]  loperamide (IMODIUM) 2 MG capsule Take 2 mg by mouth as needed for diarrhea or loose stools.    Yes [provider]  Magnesium 250 MG TABS Take 2 tablets by mouth daily.   Yes [provider]  penicillin v potassium (VEETID) 500 MG tablet Take 1 tablet (500 mg total) by mouth 3 (three) times daily. 02/06/18  Yes Norval Gable, MD  Turmeric Curcumin 500 MG CAPS Take 1,500 mg by mouth daily.    Yes [provider]  vitamin B-12 (CYANOCOBALAMIN) 500 MCG tablet Take 500 mcg by mouth daily.   Yes [provider]  benzonatate (TESSALON) 200 MG capsule Take one cap TID PRN cough 03/04/18   Lorin Picket, PA-C  guaiFENesin-codeine (CHERATUSSIN AC) 100-10 MG/5ML syrup Take 5 mLs by mouth 3 (three) times daily as needed for cough. 03/04/18   Lorin Picket, PA-C  omeprazole (PRILOSEC) 40 MG capsule Take 20 mg by mouth.  10/20/14 02/27/18  [provider]  rosuvastatin (CRESTOR) 5 MG tablet Take 5 mg by mouth daily. 04/17/16 02/27/18  [provider]  sertraline (ZOLOFT) 100 MG tablet Take 1.5 tablets (150 mg total) by mouth daily. Patient taking differently: Take 100-150 mg by mouth See admin instructions. Takes 100 mg daily except on Monday, Wednesday and Fridays and takes 150 mg (1.5 tablet) 05/19/15 02/27/18  Elvin So, MD    Family History Family History  Problem Relation Age of Onset  . Hypertension Mother   . Depression  Mother   . Heart failure Father   . Obesity Brother   . Heart disease Brother   . Stroke Brother   . Depression Brother   . Breast cancer Cousin   . Breast cancer Cousin   . Breast cancer Cousin     Social History Social History   Tobacco Use  . Smoking status: Former Smoker    Packs/day: 1.00    Years: 10.00    Pack years: 10.00    Last attempt to quit: 05/18/1965    Years since quitting: 52.8  . Smokeless tobacco: Never Used  Substance Use Topics  . Alcohol use: Yes    Alcohol/week: 7.0 standard drinks    Types: 7 Glasses of wine per week  . Drug use: No     Allergies   Sulfamethoxazole; Statins;  Trazodone; Ciprofloxacin; and Sulfa antibiotics   Review of Systems Review of Systems  Constitutional: Positive for activity change. Negative for chills, fatigue and fever.  HENT: Positive for congestion, postnasal drip and sore throat.   Respiratory: Positive for cough.   All other systems reviewed and are negative.    Physical Exam Triage Vital Signs ED Triage Vitals  Enc Vitals Group     BP 03/04/18 0838 134/72     Pulse Rate 03/04/18 0838 87     Resp 03/04/18 0838 18     Temp 03/04/18 0838 98.9 F (37.2 C)     Temp Source 03/04/18 0838 Oral     SpO2 03/04/18 0838 97 %     Weight 03/04/18 0836 200 lb (90.7 kg)     Height 03/04/18 0836 5\' 5"  (1.651 m)     Head Circumference --      Peak Flow --      Pain Score 03/04/18 0836 0     Pain Loc --      Pain Edu? --      Excl. in [place]? --    No data found.  Updated Vital Signs BP 134/72 (BP Location: Left Arm)   Pulse 87   Temp 98.9 F (37.2 C) (Oral)   Resp 18   Ht 5\' 5"  (1.651 m)   Wt 200 lb (90.7 kg)   SpO2 97%   BMI 33.28 kg/m   Visual Acuity Right Eye Distance:   Left Eye Distance:   Bilateral Distance:    Right Eye Near:   Left Eye Near:    Bilateral Near:     Physical Exam  Constitutional: She is oriented to person, place, and time. She appears well-developed and well-nourished.   Non-toxic appearance. She does not appear ill. No distress.  HENT:  Head: Normocephalic.  Right Ear: Hearing and tympanic membrane normal.  Left Ear: Hearing, tympanic membrane and ear canal normal.  Mouth/Throat: Oropharynx is clear and moist and mucous membranes are normal. No oral lesions. No uvula swelling. No oropharyngeal exudate, posterior oropharyngeal edema, posterior oropharyngeal erythema or tonsillar abscesses. Tonsils are 1+ on the right. Tonsils are 1+ on the left. No tonsillar exudate.  Right canal is moderately occluded with cerumen  Eyes: Pupils are equal, round, and reactive to light.  Neck: Normal range of motion.  Pulmonary/Chest: Effort normal and breath sounds normal.  Neurological: She is alert and oriented to person, place, and time.  Skin: Skin is warm and dry.  Psychiatric: She has a normal mood and affect. Her behavior is normal.  Nursing note and vitals reviewed.    UC Treatments / Results  Labs (all labs ordered are listed, but only abnormal results are displayed) Labs Reviewed  RAPID STREP SCREEN ([facility] ONLY)  CULTURE, GROUP A STREP [facility])    EKG None  Radiology No results found.  Procedures Procedures (including critical care time)  Medications Ordered in UC Medications - No data to display  Initial Impression / Assessment and Plan / UC Course  I have reviewed the triage vital signs and the nursing notes.  Pertinent labs & imaging results that were available during my care of the patient were reviewed by me and considered in my medical decision making (see chart for details).   I have discussed the results of the rapid strep swab.  It is currently negative.  We will culture it for confirmation.  I have advised her she has an upper respiratory infection that is  viral does not require antibiotics.  We will treat her coughing symptomatically and also suggested the use of Flonase for 2 to 3 weeks.  She runs high fevers or the coughing  worsens or persist she may return to our clinic or go to her primary care physician for further evaluation.   Final Clinical Impressions(s) / UC Diagnoses   Final diagnoses:  Upper respiratory tract infection, unspecified type     Discharge Instructions     Also suggested using Flonase 2 sprays once daily for 2 to 3 weeks to help with drainage.    ED Prescriptions    Medication Sig Dispense Auth. Provider   guaiFENesin-codeine (CHERATUSSIN AC) 100-10 MG/5ML syrup Take 5 mLs by mouth 3 (three) times daily as needed for cough. 120 mL Crecencio Mc P, PA-C   benzonatate (TESSALON) 200 MG capsule Take one cap TID PRN cough 30 capsule Lorin Picket, PA-C     Controlled Substance Prescriptions [place] Controlled Substance Registry consulted? Not Applicable   Lorin Picket, PA-C 03/04/18 [id number]

## 2018-03-04 NOTE — Discharge Instructions (Signed)
Also suggested using Flonase 2 sprays once daily for 2 to 3 weeks to help with drainage.

## 2018-03-06 LAB — CULTURE, GROUP A STREP (THRC)

## 2018-03-12 ENCOUNTER — Other Ambulatory Visit: Payer: Self-pay

## 2018-03-12 ENCOUNTER — Encounter: Payer: Self-pay | Admitting: Emergency Medicine

## 2018-03-12 ENCOUNTER — Ambulatory Visit
Admission: EM | Admit: 2018-03-12 | Discharge: 2018-03-12 | Disposition: A | Discharge status: Home / Self Care | Payer: Medicare HMO | Attending: Family Medicine | Admitting: Family Medicine

## 2018-03-12 DIAGNOSIS — J209 Acute bronchitis, unspecified: Secondary | ICD-10-CM | POA: Insufficient documentation

## 2018-03-12 MED ORDER — DOXYCYCLINE HYCLATE 100 MG PO CAPS: 100.0000 mg | ORAL_CAPSULE | Freq: Two times a day (BID) | ORAL | 0 refills | Status: DC

## 2018-03-12 NOTE — ED Provider Notes (Signed)
[facility]    CSN: [id number] Arrival date & time: 03/12/18  1159  History   Chief Complaint Chief Complaint  Patient presents with  . Cough   HPI  77 year old female presents with cough.  Patient recently seen on 12/9.  Was diagnosed with a viral respiratory illness.  Patient states that she has not improved.  Continues to feel poorly.  She states that she is having severe cough.  Worse at night.  Has had no relief.  No reports of fever.  She denies any other respiratory symptoms.  No other complaints or concerns at this time.  PMH, Surgical Hx, Family Hx, Social History reviewed and updated as below.  Past Medical History:  Diagnosis Date  . Anxiety   . Breast cancer ([place]) 08/24/2016   right  . Cancer ([place])    skin ca  . Colon polyp   . Depression   . GERD (gastroesophageal reflux disease)   . Hypercholesteremia   . Hypertension   . Osteoarthritis   . Personal history of radiation therapy   . Thyroid nodule 04/17/2016    Patient Active Problem List   Diagnosis Date Noted  . Iron deficiency anemia 10/31/2017  . B12 deficiency 10/31/2017  . Arthralgia 06/03/2017  . Osteoporosis 10/04/2016  . Breast cancer of upper-outer quadrant of right female breast ([place]) 08/24/2016  . Closed fracture of distal end of ulna 06/01/2015  . Triggering of digit 05/27/2014  . Hamstring muscle strain 05/26/2014  . Arthritis of knee, degenerative 03/26/2014  . Long term current use of opiate analgesic 04/21/2013  . Arthritis, degenerative 04/21/2013  . Anxiety 10/05/2011  . Colon polyp 10/05/2011  . Esophageal foreign body 10/05/2011  . Acid reflux 10/05/2011  . BP (high blood pressure) 10/05/2011  . Hypercholesterolemia 10/05/2011    Past Surgical History:  Procedure Laterality Date  . ABDOMINAL HYSTERECTOMY    . APPENDECTOMY    . BREAST BIOPSY Right 08/24/2016   9:30 - invasive mammary carcinoma with lobular features  . BREAST BIOPSY Right 08/24/2016   10:00  - invasive mammary carcinoma with lobular features  . BREAST LUMPECTOMY Right 10/09/2016  . COLONOSCOPY W/ POLYPECTOMY  2003, 2008, 2014  . COLONOSCOPY WITH PROPOFOL N/A 12/03/2017   Procedure: COLONOSCOPY WITH PROPOFOL;  Surgeon: Manya Silvas, MD;  Location: [facility] ENDOSCOPY;  Service: Endoscopy;  Laterality: N/A;  . ESOPHAGOGASTRODUODENOSCOPY     foreign body  . ESOPHAGOGASTRODUODENOSCOPY (EGD) WITH PROPOFOL N/A 12/03/2017   Procedure: ESOPHAGOGASTRODUODENOSCOPY (EGD) WITH PROPOFOL;  Surgeon: Manya Silvas, MD;  Location: [facility] ENDOSCOPY;  Service: Endoscopy;  Laterality: N/A;  . JOINT REPLACEMENT Right 2008   TKR  . NOSE SURGERY    . PARTIAL MASTECTOMY WITH NEEDLE LOCALIZATION Right 09/29/2016   Procedure: PARTIAL MASTECTOMY WITH NEEDLE LOCALIZATION;  Surgeon: [name], MD;  Location: [facility] ORS;  Service: General;  Laterality: Right;  . REPLACEMENT TOTAL KNEE Right 2008  . SENTINEL NODE BIOPSY Right 09/29/2016   Procedure: SENTINEL NODE BIOPSY;  Surgeon: [name], MD;  Location: [facility] ORS;  Service: General;  Laterality: Right;    OB History   No obstetric history on file.     Home Medications    Prior to Admission medications   Medication Sig Start Date End Date Taking? Authorizing Provider  acetaminophen (TYLENOL) 500 MG tablet Take 500 mg by mouth 2 (two) times daily.   Yes [provider]  amLODipine (NORVASC) 10 MG tablet Take 10 mg by mouth daily. 09/20/17 09/20/18  Yes [provider]  buPROPion (WELLBUTRIN XL) 300 MG 24 hr tablet Take 300 mg by mouth daily. 10/23/17 10/23/18 Yes [provider]  busPIRone (BUSPAR) 10 MG tablet Take 10 mg by mouth 3 (three) times daily. 04/17/16  Yes [provider]  calcium carbonate 1250 MG capsule Take 1,250 mg by mouth 2 (two) times daily with a meal.   Yes [provider]  Cholecalciferol (VITAMIN D3) 1000 units CAPS Take 1,000 Units by mouth 2 (two) times daily.   Yes  [provider]  denosumab (PROLIA) 60 MG/ML SOLN injection Inject 60 mg into the skin every 6 (six) months. Administer in upper arm, thigh, or abdomen   Yes [provider]  exemestane (AROMASIN) 25 MG tablet TAKE 1 TABLET (25 MG TOTAL) BY MOUTH DAILY AFTER BREAKFAST. 07/20/17  Yes Karen Kitchens, NP  guaiFENesin-codeine (CHERATUSSIN AC) 100-10 MG/5ML syrup Take 5 mLs by mouth 3 (three) times daily as needed for cough. 03/04/18  Yes Lorin Picket, PA-C  lisinopril (PRINIVIL,ZESTRIL) 40 MG tablet Take 40 mg by mouth daily. 09/20/17 09/20/18 Yes [provider]  loperamide (IMODIUM) 2 MG capsule Take 2 mg by mouth as needed for diarrhea or loose stools.    Yes [provider]  Magnesium 250 MG TABS Take 2 tablets by mouth daily.   Yes [provider]  omeprazole (PRILOSEC) 40 MG capsule Take 20 mg by mouth.  10/20/14 03/12/18 Yes [provider]  rosuvastatin (CRESTOR) 5 MG tablet Take 5 mg by mouth daily. 04/17/16 03/12/18 Yes [provider]  sertraline (ZOLOFT) 100 MG tablet Take 1.5 tablets (150 mg total) by mouth daily. Patient taking differently: Take 100-150 mg by mouth See admin instructions. Takes 100 mg daily except on Monday, Wednesday and Fridays and takes 150 mg (1.5 tablet) 05/19/15 03/12/18 Yes Ravi, Himabindu, MD  Turmeric Curcumin 500 MG CAPS Take 1,500 mg by mouth daily.    Yes [provider]  vitamin B-12 (CYANOCOBALAMIN) 500 MCG tablet Take 500 mcg by mouth daily.   Yes [provider]  benzonatate (TESSALON) 200 MG capsule Take one cap TID PRN cough 03/04/18   Lorin Picket, PA-C  doxycycline (VIBRAMYCIN) 100 MG capsule Take 1 capsule (100 mg total) by mouth 2 (two) times daily. 03/12/18   Coral Spikes, DO  lidocaine (XYLOCAINE) 2 % solution 20 ml gargle and spit q 6 hours prn sore throat 02/06/18   Norval Gable, MD    Family History Family History  Problem Relation Age of Onset  .  Hypertension Mother   . Depression Mother   . Heart failure Father   . Obesity Brother   . Heart disease Brother   . Stroke Brother   . Depression Brother   . Breast cancer Cousin   . Breast cancer Cousin   . Breast cancer Cousin     Social History Social History   Tobacco Use  . Smoking status: Former Smoker    Packs/day: 1.00    Years: 10.00    Pack years: 10.00    Last attempt to quit: 05/18/1965    Years since quitting: 52.8  . Smokeless tobacco: Never Used  Substance Use Topics  . Alcohol use: Yes    Alcohol/week: 7.0 standard drinks    Types: 7 Glasses of wine per week  . Drug use: No     Allergies   Sulfamethoxazole; Statins; Trazodone; Ciprofloxacin; and Sulfa antibiotics   Review of Systems Review of Systems  Constitutional: Negative for fever.  Respiratory: Positive for cough.    Physical Exam Triage Vital Signs ED Triage Vitals [03/12/18 1226]  Enc Vitals Group     BP 133/73     Pulse Rate 83     Resp 18     Temp 98.4 F (36.9 C)     Temp Source Oral     SpO2 97 %     Weight 200 lb (90.7 kg)     Height 5\' 5"  (1.651 m)     Head Circumference      Peak Flow      Pain Score 0     Pain Loc      Pain Edu?      Excl. in [place]?    Updated Vital Signs BP 133/73 (BP Location: Left Arm)   Pulse 83   Temp 98.4 F (36.9 C) (Oral)   Resp 18   Ht 5\' 5"  (1.651 m)   Wt 90.7 kg   SpO2 97%   BMI 33.28 kg/m   Visual Acuity Right Eye Distance:   Left Eye Distance:   Bilateral Distance:    Right Eye Near:   Left Eye Near:    Bilateral Near:     Physical Exam Vitals signs and nursing note reviewed.  Constitutional:      General: She is not in acute distress.    Appearance: Normal appearance. She is not ill-appearing or toxic-appearing.  HENT:     Head: Normocephalic and atraumatic.     Mouth/Throat:     Pharynx: Oropharynx is clear. No posterior oropharyngeal erythema.  Cardiovascular:     Rate and Rhythm: Normal rate and regular rhythm.    Pulmonary:     Effort: Pulmonary effort is normal. No respiratory distress.     Breath sounds: No wheezing, rhonchi or rales.  Neurological:     Mental Status: She is alert.  Psychiatric:        Behavior: Behavior normal.     Comments: Flat affect.    UC Treatments / Results  Labs (all labs ordered are listed, but only abnormal results are displayed) Labs Reviewed - No data to display  EKG None  Radiology No results found.  Procedures Procedures (including critical care time)  Medications Ordered in UC Medications - No data to display  Initial Impression / Assessment and Plan / UC Course  I have reviewed the triage vital signs and the nursing notes.  Pertinent labs & imaging results that were available during my care of the patient were reviewed by me and considered in my medical decision making (see chart for details).    77 year old female presents with persistent symptoms of cough.  Diagnosis: Bronchitis.  Given lack of improvement and age, placing on doxycycline. Final Clinical Impressions(s) / UC Diagnoses   Final diagnoses:  Acute bronchitis, unspecified organism   Discharge Instructions   None    ED Prescriptions    Medication Sig Dispense Auth. Provider   doxycycline (VIBRAMYCIN) 100 MG capsule Take 1 capsule (100 mg total) by mouth 2 (two) times daily. 14 capsule Coral Spikes, DO     Controlled Substance Prescriptions [place] Controlled Substance Registry consulted? Not Applicable   Coral Spikes, DO 03/12/18 1339

## 2018-03-12 NOTE — ED Triage Notes (Signed)
Patient in today c/o continued cough. Patient seen 03/04/18 for cough and was told viral. Patient states the cough has continued and thinks she needs an antibiotic.

## 2018-04-04 ENCOUNTER — Ambulatory Visit
Payer: Medicare HMO | Attending: Student in an Organized Health Care Education/Training Program | Admitting: Student in an Organized Health Care Education/Training Program

## 2018-04-04 VITALS — BP 142/72 | HR 95 | Temp 98.2°F | Resp 16 | Ht 65.0 in | Wt 200.0 lb

## 2018-04-04 DIAGNOSIS — M25562 Pain in left knee: Secondary | ICD-10-CM | POA: Insufficient documentation

## 2018-04-04 DIAGNOSIS — G894 Chronic pain syndrome: Secondary | ICD-10-CM

## 2018-04-04 DIAGNOSIS — M1712 Unilateral primary osteoarthritis, left knee: Secondary | ICD-10-CM | POA: Diagnosis not present

## 2018-04-04 DIAGNOSIS — G8929 Other chronic pain: Secondary | ICD-10-CM

## 2018-04-04 NOTE — Patient Instructions (Signed)
____________________________________________________________________________________________  Preparing for Procedure with Sedation  Instructions: . Oral Intake: Do not eat or drink anything for at least 8 hours prior to your procedure. . Transportation: Public transportation is not allowed. Bring an adult driver. The driver must be physically present in our waiting room before any procedure can be started. . Physical Assistance: Bring an adult physically capable of assisting you, in the event you need help. This adult should keep you company at home for at least 6 hours after the procedure. . Blood Pressure Medicine: Take your blood pressure medicine with a sip of water the morning of the procedure. . Blood thinners: Notify our staff if you are taking any blood thinners. Depending on which one you take, there will be specific instructions on how and when to stop it. . Diabetics on insulin: Notify the staff so that you can be scheduled 1st case in the morning. If your diabetes requires high dose insulin, take only  of your normal insulin dose the morning of the procedure and notify the staff that you have done so. . Preventing infections: Shower with an antibacterial soap the morning of your procedure. . Build-up your immune system: Take 1000 mg of Vitamin C with every meal (3 times a day) the day prior to your procedure. . Antibiotics: Inform the staff if you have a condition or reason that requires you to take antibiotics before dental procedures. . Pregnancy: If you are pregnant, call and cancel the procedure. . Sickness: If you have a cold, fever, or any active infections, call and cancel the procedure. . Arrival: You must be in the facility at least 30 minutes prior to your scheduled procedure. . Children: Do not bring children with you. . Dress appropriately: Bring dark clothing that you would not mind if they get stained. . Valuables: Do not bring any jewelry or valuables.  Procedure  appointments are reserved for interventional treatments only. . No Prescription Refills. . No medication changes will be discussed during procedure appointments. . No disability issues will be discussed.  Reasons to call and reschedule or cancel your procedure: (Following these recommendations will minimize the risk of a serious complication.) . Surgeries: Avoid having procedures within 2 weeks of any surgery. (Avoid for 2 weeks before or after any surgery). . Flu Shots: Avoid having procedures within 2 weeks of a flu shots or . (Avoid for 2 weeks before or after immunizations). . Barium: Avoid having a procedure within 7-10 days after having had a radiological study involving the use of radiological contrast. (Myelograms, Barium swallow or enema study). . Heart attacks: Avoid any elective procedures or surgeries for the initial 6 months after a "Myocardial Infarction" (Heart Attack). . Blood thinners: It is imperative that you stop these medications before procedures. Let us know if you if you take any blood thinner.  . Infection: Avoid procedures during or within two weeks of an infection (including chest colds or gastrointestinal problems). Symptoms associated with infections include: Localized redness, fever, chills, night sweats or profuse sweating, burning sensation when voiding, cough, congestion, stuffiness, runny nose, sore throat, diarrhea, nausea, vomiting, cold or Flu symptoms, recent or current infections. It is specially important if the infection is over the area that we intend to treat. . Heart and lung problems: Symptoms that may suggest an active cardiopulmonary problem include: cough, chest pain, breathing difficulties or shortness of breath, dizziness, ankle swelling, uncontrolled high or unusually low blood pressure, and/or palpitations. If you are experiencing any of these symptoms, cancel   your procedure and contact your primary care physician for an evaluation.  Remember:   Regular Business hours are:  Monday to Thursday 8:00 AM to 4:00 PM  Provider's Schedule: Milinda Pointer, MD:  Procedure days: Tuesday and Thursday 7:30 AM to 4:00 PM  Gillis Santa, MD:  Procedure days: Monday and Wednesday 7:30 AM to 4:00 PM ____________________________________________________________________________________________   Radiofrequency Lesioning Radiofrequency lesioning is a procedure that is performed to relieve pain. The procedure is often used for back, neck, or arm pain. Radiofrequency lesioning involves the use of a machine that creates radio waves to make heat. During the procedure, the heat is applied to the nerve that carries the pain signal. The heat damages the nerve and interferes with the pain signal. Pain relief usually starts about 2 weeks after the procedure and lasts for 6 months to 1 year. Tell a health care provider about:  Any allergies you have.  All medicines you are taking, including vitamins, herbs, eye drops, creams, and over-the-counter medicines.  Any problems you or family members have had with anesthetic medicines.  Any blood disorders you have.  Any surgeries you have had.  Any medical conditions you have.  Whether you are pregnant or may be pregnant. What are the risks? Generally, this is a safe procedure. However, problems may occur, including:  Pain or soreness at the injection site.  Infection at the injection site.  Damage to nerves or blood vessels. What happens before the procedure?  Ask your health care provider about: ? Changing or stopping your regular medicines. This is especially important if you are taking diabetes medicines or blood thinners. ? Taking medicines such as aspirin and ibuprofen. These medicines can thin your blood. Do not take these medicines before your procedure if your health care provider instructs you not to.  Follow instructions from your health care provider about eating or drinking  restrictions.  Plan to have someone take you home after the procedure.  If you go home right after the procedure, plan to have someone with you for 24 hours. What happens during the procedure?  You will be given one or more of the following: ? A medicine to help you relax (sedative). ? A medicine to numb the area (local anesthetic).  You will be awake during the procedure. You will need to be able to talk with the health care provider during the procedure.  With the help of a type of X-ray (fluoroscopy), the health care provider will insert a radiofrequency needle into the area to be treated.  Next, a wire that carries the radio waves (electrode) will be put through the radiofrequency needle. An electrical pulse will be sent through the electrode to verify the correct nerve. You will feel a tingling sensation, and you may have muscle twitching.  Then, the tissue that is around the needle tip will be heated by an electric current that is passed using the radiofrequency machine. This will numb the nerves.  A bandage (dressing) will be put on the insertion area after the procedure is done. The procedure may vary among health care providers and hospitals. What happens after the procedure?  Your blood pressure, heart rate, breathing rate, and blood oxygen level will be monitored often until the medicines you were given have worn off.  Return to your normal activities as directed by your health care provider. This information is not intended to replace advice given to you by your health care provider. Make sure you discuss any questions you have  with your health care provider. Document Released: 11/09/2010 Document Revised: 08/19/2015 Document Reviewed: 04/20/2014 [employer] Interactive Patient Education  2019 [employer].

## 2018-04-04 NOTE — Progress Notes (Signed)
Safety precautions to be maintained throughout the outpatient stay will include: orient to surroundings, keep bed in low position, maintain call bell within reach at all times, provide assistance with transfer out of bed and ambulation.  

## 2018-04-04 NOTE — Progress Notes (Signed)
Patient's Name: Allison Hall  MRN: [id number]  Referring Provider: Clarisse Gouge, MD  DOB: Sep 14, 1940  PCP: Clarisse Gouge, MD  DOS: 04/04/2018  Note by: Gillis Santa, MD  Service setting: Ambulatory outpatient  Specialty: Interventional Pain Management  Location: [facility] (AMB) Pain Management Facility    Patient type: Established   Primary Reason(s) for Visit: Encounter for post-procedure evaluation of chronic illness with mild to moderate exacerbation CC: Knee Pain (bilateral)  HPI  Allison Hall is a 78 y.o. year old, female patient, who comes today for a post-procedure evaluation. She has Anxiety; Colon polyp; Esophageal foreign body; Acid reflux; BP (high blood pressure); Hypercholesterolemia; Long term current use of opiate analgesic; Arthritis, degenerative; Arthritis of knee, degenerative; Hamstring muscle strain; Triggering of digit; Closed fracture of distal end of ulna; Breast cancer of upper-outer quadrant of right female breast ([place]); Osteoporosis; Arthralgia; Iron deficiency anemia; B12 deficiency; Chronic pain of left knee; and Chronic pain syndrome on their problem list. Her primarily concern today is the Knee Pain (bilateral)  Pain Assessment: Location: Right, Left Knee Radiating:   Onset: More than a month ago Duration: Chronic pain Quality: Aching, Throbbing, Heaviness Severity: 2 /10 (subjective, self-reported pain score)  Note: Reported level is compatible with observation.                         When using our objective Pain Scale, levels between 6 and 10/10 are said to belong in an emergency room, as it progressively worsens from a 6/10, described as severely limiting, requiring emergency care not usually available at an outpatient pain management facility. At a 6/10 level, communication becomes difficult and requires great effort. Assistance to reach the emergency department may be required. Facial flushing and profuse sweating along with potentially dangerous  increases in heart rate and blood pressure will be evident. Effect on ADL:   Timing: Intermittent Modifying factors: rest BP: (!) 142/72  HR: 95  Allison Hall comes in today for post-procedure evaluation.  Further details on both, my assessment(s), as well as the proposed treatment plan, please see below.  Post-Procedure Assessment  02/27/2018 Procedure: diagnostic left knee GNB Pre-procedure pain score:        /10 Post-procedure pain score: 0/10         Influential Factors: BMI: 33.28 kg/m Intra-procedural challenges: None observed.         Assessment challenges: None detected.              Reported side-effects: None.        Post-procedural adverse reactions or complications: None reported         Sedation: Please see nurses note. When no sedatives are used, the analgesic levels obtained are directly associated to the effectiveness of the local anesthetics. However, when sedation is provided, the level of analgesia obtained during the initial 1 hour following the intervention, is believed to be the result of a combination of factors. These factors may include, but are not limited to: 1. The effectiveness of the local anesthetics used. 2. The effects of the analgesic(s) and/or anxiolytic(s) used. 3. The degree of discomfort experienced by the patient at the time of the procedure. 4. The patients ability and reliability in recalling and recording the events. 5. The presence and influence of possible secondary gains and/or psychosocial factors. Reported result: Relief experienced during the 1st hour after the procedure: 100 % (Ultra-Short Term Relief)  Interpretative annotation: Clinically appropriate result. Analgesia during this period is likely to be Local Anesthetic and/or IV Sedative (Analgesic/Anxiolytic) related.          Effects of local anesthetic: The analgesic effects attained during this period are directly associated to the localized infiltration of local  anesthetics and therefore cary significant diagnostic value as to the etiological location, or anatomical origin, of the pain. Expected duration of relief is directly dependent on the pharmacodynamics of the local anesthetic used. Long-acting (4-6 hours) anesthetics used.  Reported result: Relief during the next 4 to 6 hour after the procedure: 100 % (Short-Term Relief)            Interpretative annotation: Clinically appropriate result. Analgesia during this period is likely to be Local Anesthetic-related.          Long-term benefit: Defined as the period of time past the expected duration of local anesthetics (1 hour for short-acting and 4-6 hours for long-acting). With the possible exception of prolonged sympathetic blockade from the local anesthetics, benefits during this period are typically attributed to, or associated with, other factors such as analgesic sensory neuropraxia, antiinflammatory effects, or beneficial biochemical changes provided by agents other than the local anesthetics.  Reported result: Extended relief following procedure:80%   (Long-Term Relief)            Interpretative annotation: Clinically possible results. Good relief. No permanent benefit expected. Inflammation plays a part in the etiology to the pain.          Current benefits: Defined as reported results that persistent at this point in time.   Analgesia: 50-75 %            Function: Somewhat improved ROM: Somewhat improved Interpretative annotation: Recurrence of symptoms. No permanent benefit expected. Effective diagnostic intervention.          Interpretation: Results would suggest a successful diagnostic intervention.                  Plan:  Proceed with Radiofrequency Ablation for the purpose of attaining long-term benefits.                Laboratory Chemistry  Inflammation Markers (CRP: Acute Phase) (ESR: Chronic Phase) No results found for: CRP, ESRSEDRATE, LATICACIDVEN                        Rheumatology Markers No results found for: RF, ANA, LABURIC, URICUR, LYMEIGGIGMAB, LYMEABIGMQN, HLAB27                      Renal Function Markers Lab Results  Component Value Date   BUN 41 (H) 02/28/2018   CREATININE 1.21 (H) 02/28/2018   GFRAA 50 (L) 02/28/2018   GFRNONAA 43 (L) 02/28/2018                             Hepatic Function Markers Lab Results  Component Value Date   AST 23 08/29/2017   ALT 17 08/29/2017   ALBUMIN 4.8 08/29/2017   ALKPHOS 55 08/29/2017                        Electrolytes Lab Results  Component Value Date   NA 137 02/28/2018   K 3.9 02/28/2018   CL 101 02/28/2018   CALCIUM 9.5 02/28/2018  Neuropathy Markers Lab Results  Component Value Date   [id number] 098 01/29/2018   FOLATE 8.4 08/29/2017                        CNS Tests No results found for: COLORCSF, APPEARCSF, RBCCOUNTCSF, WBCCSF, POLYSCSF, LYMPHSCSF, EOSCSF, PROTEINCSF, GLUCCSF, JCVIRUS, CSFOLI, IGGCSF                      Bone Pathology Markers No results found for: VD25OH, [id number], [id number], OZ3086VH8, 25OHVITD1, 25OHVITD2, 25OHVITD3, TESTOFREE, TESTOSTERONE                       Coagulation Parameters Lab Results  Component Value Date   PLT 214 01/29/2018                        Cardiovascular Markers Lab Results  Component Value Date   TROPONINI <0.03 12/09/2017   HGB 12.7 01/29/2018   HCT 38.9 01/29/2018                         CA Markers Lab Results  Component Value Date   LABCA2 13.4 09/06/2016                        Note: Lab results reviewed.  Recent Diagnostic Imaging Results  DG C-Arm 1-60 Min-No Report Fluoroscopy was utilized by the requesting physician.  No radiographic  interpretation.   Complexity Note: Imaging results reviewed. Results shared with Ms. Mundorf, using Layman's terms.                         Meds   Current Outpatient Medications:  .  acetaminophen (TYLENOL) 500 MG tablet, Take 500 mg by mouth 2  (two) times daily., Disp: , Rfl:  .  amLODipine (NORVASC) 10 MG tablet, Take 10 mg by mouth daily., Disp: , Rfl:  .  benzonatate (TESSALON) 200 MG capsule, Take one cap TID PRN cough, Disp: 30 capsule, Rfl: 0 .  buPROPion (WELLBUTRIN XL) 300 MG 24 hr tablet, Take 300 mg by mouth daily., Disp: , Rfl:  .  busPIRone (BUSPAR) 10 MG tablet, Take 10 mg by mouth 3 (three) times daily., Disp: , Rfl:  .  calcium carbonate 1250 MG capsule, Take 1,250 mg by mouth 2 (two) times daily with a meal., Disp: , Rfl:  .  Cholecalciferol (VITAMIN D3) 1000 units CAPS, Take 1,000 Units by mouth 2 (two) times daily., Disp: , Rfl:  .  denosumab (PROLIA) 60 MG/ML SOLN injection, Inject 60 mg into the skin every 6 (six) months. Administer in upper arm, thigh, or abdomen, Disp: , Rfl:  .  doxycycline (VIBRAMYCIN) 100 MG capsule, Take 1 capsule (100 mg total) by mouth 2 (two) times daily., Disp: 14 capsule, Rfl: 0 .  exemestane (AROMASIN) 25 MG tablet, TAKE 1 TABLET (25 MG TOTAL) BY MOUTH DAILY AFTER BREAKFAST., Disp: 90 tablet, Rfl: 3 .  guaiFENesin-codeine (CHERATUSSIN AC) 100-10 MG/5ML syrup, Take 5 mLs by mouth 3 (three) times daily as needed for cough., Disp: 120 mL, Rfl: 0 .  lidocaine (XYLOCAINE) 2 % solution, 20 ml gargle and spit q 6 hours prn sore throat, Disp: 100 mL, Rfl: 0 .  lisinopril (PRINIVIL,ZESTRIL) 40 MG tablet, Take 40 mg by mouth daily., Disp: , Rfl:  .  loperamide (IMODIUM) 2 MG capsule, Take  2 mg by mouth as needed for diarrhea or loose stools. , Disp: , Rfl:  .  Magnesium 250 MG TABS, Take 2 tablets by mouth daily., Disp: , Rfl:  .  Turmeric Curcumin 500 MG CAPS, Take 1,500 mg by mouth daily. , Disp: , Rfl:  .  vitamin B-12 (CYANOCOBALAMIN) 500 MCG tablet, Take 500 mcg by mouth daily., Disp: , Rfl:  .  omeprazole (PRILOSEC) 40 MG capsule, Take 20 mg by mouth. , Disp: , Rfl:  .  rosuvastatin (CRESTOR) 5 MG tablet, Take 5 mg by mouth daily., Disp: , Rfl:  .  sertraline (ZOLOFT) 100 MG tablet, Take 1.5  tablets (150 mg total) by mouth daily. (Patient taking differently: Take 100-150 mg by mouth See admin instructions. Takes 100 mg daily except on Monday, Wednesday and Fridays and takes 150 mg (1.5 tablet)), Disp: 45 tablet, Rfl: 1  ROS  Constitutional: Denies any fever or chills Gastrointestinal: No reported hemesis, hematochezia, vomiting, or acute GI distress Musculoskeletal: Denies any acute onset joint swelling, redness, loss of ROM, or weakness Neurological: No reported episodes of acute onset apraxia, aphasia, dysarthria, agnosia, amnesia, paralysis, loss of coordination, or loss of consciousness  Allergies  Ms. Voth is allergic to sulfamethoxazole; statins; trazodone; ciprofloxacin; and sulfa antibiotics.  [place]  Drug: Ms. Leisinger  reports no history of drug use. Alcohol:  reports current alcohol use of about 7.0 standard drinks of alcohol per week. Tobacco:  reports that she quit smoking about 52 years ago. She has a 10.00 pack-year smoking history. She has never used smokeless tobacco. Medical:  has a past medical history of Anxiety, Breast cancer ([place]) (08/24/2016), Cancer [facility]), Colon polyp, Depression, GERD (gastroesophageal reflux disease), Hypercholesteremia, Hypertension, Osteoarthritis, Personal history of radiation therapy, and Thyroid nodule (04/17/2016). Surgical: Ms. Lauf  has a past surgical history that includes Abdominal hysterectomy; Appendectomy; Nose surgery; Replacement total knee (Right, 2008); Partial mastectomy with needle localization (Right, 09/29/2016); Sentinel node biopsy (Right, 09/29/2016); Breast lumpectomy (Right, 10/09/2016); Breast biopsy (Right, 08/24/2016); Breast biopsy (Right, 08/24/2016); Colonoscopy w/ polypectomy (2003, 2008, 2014); Esophagogastroduodenoscopy; Joint replacement (Right, 2008); Colonoscopy with propofol (N/A, 12/03/2017); and Esophagogastroduodenoscopy (egd) with propofol (N/A, 12/03/2017). Family: family history includes Breast  cancer in her cousin, cousin, and cousin; Depression in her brother and mother; Heart disease in her brother; Heart failure in her father; Hypertension in her mother; Obesity in her brother; Stroke in her brother.  Constitutional Exam  General appearance: Well nourished, well developed, and well hydrated. In no apparent acute distress Vitals:   04/04/18 1023  BP: (!) 142/72  Pulse: 95  Resp: 16  Temp: 98.2 F (36.8 C)  TempSrc: Oral  SpO2: 98%  Weight: 200 lb (90.7 kg)  Height: _0  (1.651 m)   BMI Assessment: Estimated body mass index is 33.28 kg/m as calculated from the following:   Height as of this encounter: _1  (1.651 m).   Weight as of this encounter: 200 lb (90.7 kg).  BMI interpretation table: BMI level Category Range association with higher incidence of chronic pain  <18 kg/m2 Underweight   18.5-24.9 kg/m2 Ideal body weight   25-29.9 kg/m2 Overweight Increased incidence by 20%  30-34.9 kg/m2 Obese (Class I) Increased incidence by 68%  35-39.9 kg/m2 Severe obesity (Class II) Increased incidence by 136%  >40 kg/m2 Extreme obesity (Class III) Increased incidence by 254%   Patient's current BMI Ideal Body weight  Body mass index is 33.28 kg/m. Ideal body weight: 57 kg (125 lb 10.6 oz) Adjusted  ideal body weight: 70.5 kg (155 lb 6.4 oz)   BMI Readings from Last 4 Encounters:  04/04/18 33.28 kg/m  03/12/18 33.28 kg/m  03/04/18 33.28 kg/m  02/27/18 33.28 kg/m   Wt Readings from Last 4 Encounters:  04/04/18 200 lb (90.7 kg)  03/12/18 200 lb (90.7 kg)  03/04/18 200 lb (90.7 kg)  02/27/18 200 lb (90.7 kg)  Psych/Mental status: Alert, oriented x 3 (person, place, & time)       Eyes: PERLA Respiratory: No evidence of acute respiratory distress  Cervical Spine Area Exam  Skin & Axial Inspection: No masses, redness, edema, swelling, or associated skin lesions Alignment: Symmetrical Functional ROM: Unrestricted ROM      Stability: No instability  detected Muscle Tone/Strength: Functionally intact. No obvious neuro-muscular anomalies detected. Sensory (Neurological): Unimpaired Palpation: No palpable anomalies              Upper Extremity (UE) Exam    Side: Right upper extremity  Side: Left upper extremity  Skin & Extremity Inspection: Skin color, temperature, and hair growth are WNL. No peripheral edema or cyanosis. No masses, redness, swelling, asymmetry, or associated skin lesions. No contractures.  Skin & Extremity Inspection: Skin color, temperature, and hair growth are WNL. No peripheral edema or cyanosis. No masses, redness, swelling, asymmetry, or associated skin lesions. No contractures.  Functional ROM: Unrestricted ROM          Functional ROM: Unrestricted ROM          Muscle Tone/Strength: Functionally intact. No obvious neuro-muscular anomalies detected.  Muscle Tone/Strength: Functionally intact. No obvious neuro-muscular anomalies detected.  Sensory (Neurological): Unimpaired          Sensory (Neurological): Unimpaired          Palpation: No palpable anomalies              Palpation: No palpable anomalies              Provocative Test(s):  Phalen's test: deferred Tinel's test: deferred Apley's scratch test (touch opposite shoulder):  Action 1 (Across chest): deferred Action 2 (Overhead): deferred Action 3 (LB reach): deferred   Provocative Test(s):  Phalen's test: deferred Tinel's test: deferred Apley's scratch test (touch opposite shoulder):  Action 1 (Across chest): deferred Action 2 (Overhead): deferred Action 3 (LB reach): deferred    Thoracic Spine Area Exam  Skin & Axial Inspection: No masses, redness, or swelling Alignment: Symmetrical Functional ROM: Unrestricted ROM Stability: No instability detected Muscle Tone/Strength: Functionally intact. No obvious neuro-muscular anomalies detected. Sensory (Neurological): Unimpaired Muscle strength & Tone: No palpable anomalies  Lumbar Spine Area Exam  Skin &  Axial Inspection: No masses, redness, or swelling Alignment: Symmetrical Functional ROM: Unrestricted ROM       Stability: No instability detected Muscle Tone/Strength: Functionally intact. No obvious neuro-muscular anomalies detected. Sensory (Neurological): Unimpaired Palpation: No palpable anomalies       Provocative Tests: Hyperextension/rotation test: deferred today       Lumbar quadrant test (Kemp's test): deferred today       Lateral bending test: deferred today       Patrick's Maneuver: deferred today                   FABER* test: deferred today                   S-I anterior distraction/compression test: deferred today         S-I lateral compression test: deferred today  S-I Thigh-thrust test: deferred today         S-I Gaenslen's test: deferred today         *(Flexion, ABduction and External Rotation)  Gait & Posture Assessment  Ambulation: Unassisted Gait: Relatively normal for age and body habitus Posture: WNL   Lower Extremity Exam    Side: Right lower extremity  Side: Left lower extremity  Stability: No instability observed          Stability: No instability observed          Skin & Extremity Inspection: Skin color, temperature, and hair growth are WNL. No peripheral edema or cyanosis. No masses, redness, swelling, asymmetry, or associated skin lesions. No contractures.  Skin & Extremity Inspection: Skin color, temperature, and hair growth are WNL. No peripheral edema or cyanosis. No masses, redness, swelling, asymmetry, or associated skin lesions. No contractures.  Functional ROM: Unrestricted ROM                  Functional ROM: Pain restricted ROM for knee joint          Muscle Tone/Strength: Functionally intact. No obvious neuro-muscular anomalies detected.  Muscle Tone/Strength: Functionally intact. No obvious neuro-muscular anomalies detected.  Sensory (Neurological): Unimpaired        Sensory (Neurological): Arthropathic arthralgia        DTR: Patellar:  deferred today Achilles: deferred today Plantar: deferred today  DTR: Patellar: deferred today Achilles: deferred today Plantar: deferred today  Palpation: No palpable anomalies  Palpation: No palpable anomalies   Assessment  Primary Diagnosis & Pertinent Problem List: The primary encounter diagnosis was Primary osteoarthritis of left knee. Diagnoses of Chronic pain of left knee and Chronic pain syndrome were also pertinent to this visit.  Status Diagnosis  Responding Responding Responding 1. Primary osteoarthritis of left knee   2. Chronic pain of left knee   3. Chronic pain syndrome     Problems updated and reviewed during this visit: Problem  Chronic Pain of Left Knee  Chronic Pain Syndrome   78 year old female follows up status post left knee genicular nerve block #1 performed on 02/27/2018.  Patient endorses significant pain relief and improvement in range of motion up until last Thursday when she went out to [facility] and noticed worsening pain after ambulating through the store.  Given that the patient has had a positive diagnostic nerve block with greater than 80% pain relief for 3 to 4 weeks, we discussed proceeding with left genicular radiofrequency ablation.  Risks and benefits of this procedure were discussed and patient like to proceed.  Plan: -Left knee genicular nerve radiofrequency ablation under fluoroscopy with sedation.  Plan of Care  Lab-work, procedure(s), and/or referral(s): Orders Placed This Encounter  Procedures  . Radiofrequency,Genicular   Time Note: Greater than 50% of the 25 minute(s) of face-to-face time spent with Allison Hall, was spent in counseling/coordination of care regarding: Allison Hall primary cause of pain, the treatment plan, treatment alternatives, the risks and possible complications of proposed treatment, going over the informed consent, the results, interpretation and significance of  her recent diagnostic interventional treatment(s),  realistic expectations and the goals of pain management (increased in functionality).  Provider-requested follow-up: Return in about 2 weeks (around 04/18/2018) for Procedure.  Future Appointments  Date Time Provider [place]  06/04/2018  2:00 PM [facility] LAB [facility] None  06/05/2018  1:30 PM Nolon Stalls C, MD [facility] None  06/05/2018  2:00 PM [facility] INFUSION CHAIR 4 [facility] None  01/16/2019  2:30  PM Noreene Filbert, MD [facility] None    Primary Care Physician: Clarisse Gouge, MD Location: [facility] Note by: Gillis Santa, M.D Date: 04/04/2018; Time: 1:33 PM  Patient Instructions  ____________________________________________________________________________________________  Preparing for Procedure with Sedation  Instructions: . Oral Intake: Do not eat or drink anything for at least 8 hours prior to your procedure. . Transportation: Public transportation is not allowed. Bring an adult driver. The driver must be physically present in our waiting room before any procedure can be started. Marland Kitchen Physical Assistance: Bring an adult physically capable of assisting you, in the event you need help. This adult should keep you company at home for at least 6 hours after the procedure. . Blood Pressure Medicine: Take your blood pressure medicine with a sip of water the morning of the procedure. . Blood thinners: Notify our staff if you are taking any blood thinners. Depending on which one you take, there will be specific instructions on how and when to stop it. . Diabetics on insulin: Notify the staff so that you can be scheduled 1st case in the morning. If your diabetes requires high dose insulin, take only  of your normal insulin dose the morning of the procedure and notify the staff that you have done so. . Preventing infections: Shower with an antibacterial soap the morning of your procedure. . Build-up your immune system: Take 1000 mg of Vitamin C  with every meal (3 times a day) the day prior to your procedure. Marland Kitchen Antibiotics: Inform the staff if you have a condition or reason that requires you to take antibiotics before dental procedures. . Pregnancy: If you are pregnant, call and cancel the procedure. . Sickness: If you have a cold, fever, or any active infections, call and cancel the procedure. . Arrival: You must be in the facility at least 30 minutes prior to your scheduled procedure. . Children: Do not bring children with you. . Dress appropriately: Bring dark clothing that you would not mind if they get stained. . Valuables: Do not bring any jewelry or valuables.  Procedure appointments are reserved for interventional treatments only. Marland Kitchen No Prescription Refills. . No medication changes will be discussed during procedure appointments. . No disability issues will be discussed.  Reasons to call and reschedule or cancel your procedure: (Following these recommendations will minimize the risk of a serious complication.) . Surgeries: Avoid having procedures within 2 weeks of any surgery. (Avoid for 2 weeks before or after any surgery). . Flu Shots: Avoid having procedures within 2 weeks of a flu shots or . (Avoid for 2 weeks before or after immunizations). . Barium: Avoid having a procedure within 7-10 days after having had a radiological study involving the use of radiological contrast. (Myelograms, Barium swallow or enema study). . Heart attacks: Avoid any elective procedures or surgeries for the initial 6 months after a "Myocardial Infarction" (Heart Attack). . Blood thinners: It is imperative that you stop these medications before procedures. Let us know if you if you take any blood thinner.  . Infection: Avoid procedures during or within two weeks of an infection (including chest colds or gastrointestinal problems). Symptoms associated with infections include: Localized redness, fever, chills, night sweats or profuse sweating, burning  sensation when voiding, cough, congestion, stuffiness, runny nose, sore throat, diarrhea, nausea, vomiting, cold or Flu symptoms, recent or current infections. It is specially important if the infection is over the area that we intend to treat. Marland Kitchen Heart and lung problems: Symptoms that  may suggest an active cardiopulmonary problem include: cough, chest pain, breathing difficulties or shortness of breath, dizziness, ankle swelling, uncontrolled high or unusually low blood pressure, and/or palpitations. If you are experiencing any of these symptoms, cancel your procedure and contact your primary care physician for an evaluation.  Remember:  Regular Business hours are:  Monday to Thursday 8:00 AM to 4:00 PM  Provider's Schedule: Milinda Pointer, MD:  Procedure days: Tuesday and Thursday 7:30 AM to 4:00 PM  Gillis Santa, MD:  Procedure days: Monday and Wednesday 7:30 AM to 4:00 PM ____________________________________________________________________________________________   Radiofrequency Lesioning Radiofrequency lesioning is a procedure that is performed to relieve pain. The procedure is often used for back, neck, or arm pain. Radiofrequency lesioning involves the use of a machine that creates radio waves to make heat. During the procedure, the heat is applied to the nerve that carries the pain signal. The heat damages the nerve and interferes with the pain signal. Pain relief usually starts about 2 weeks after the procedure and lasts for 6 months to 1 year. Tell a health care provider about:  Any allergies you have.  All medicines you are taking, including vitamins, herbs, eye drops, creams, and over-the-counter medicines.  Any problems you or family members have had with anesthetic medicines.  Any blood disorders you have.  Any surgeries you have had.  Any medical conditions you have.  Whether you are pregnant or may be pregnant. What are the risks? Generally, this is a safe  procedure. However, problems may occur, including:  Pain or soreness at the injection site.  Infection at the injection site.  Damage to nerves or blood vessels. What happens before the procedure?  Ask your health care provider about: ? Changing or stopping your regular medicines. This is especially important if you are taking diabetes medicines or blood thinners. ? Taking medicines such as aspirin and ibuprofen. These medicines can thin your blood. Do not take these medicines before your procedure if your health care provider instructs you not to.  Follow instructions from your health care provider about eating or drinking restrictions.  Plan to have someone take you home after the procedure.  If you go home right after the procedure, plan to have someone with you for 24 hours. What happens during the procedure?  You will be given one or more of the following: ? A medicine to help you relax (sedative). ? A medicine to numb the area (local anesthetic).  You will be awake during the procedure. You will need to be able to talk with the health care provider during the procedure.  With the help of a type of X-ray (fluoroscopy), the health care provider will insert a radiofrequency needle into the area to be treated.  Next, a wire that carries the radio waves (electrode) will be put through the radiofrequency needle. An electrical pulse will be sent through the electrode to verify the correct nerve. You will feel a tingling sensation, and you may have muscle twitching.  Then, the tissue that is around the needle tip will be heated by an electric current that is passed using the radiofrequency machine. This will numb the nerves.  A bandage (dressing) will be put on the insertion area after the procedure is done. The procedure may vary among health care providers and hospitals. What happens after the procedure?  Your blood pressure, heart rate, breathing rate, and blood oxygen level will  be monitored often until the medicines you were given have worn off.  Return to your normal activities as directed by your health care provider. This information is not intended to replace advice given to you by your health care provider. Make sure you discuss any questions you have with your health care provider. Document Released: 11/09/2010 Document Revised: 08/19/2015 Document Reviewed: 04/20/2014 Elsevier Interactive Patient Education  2019 [employer].

## 2018-04-17 ENCOUNTER — Ambulatory Visit
Admission: RE | Admit: 2018-04-17 | Discharge: 2018-04-17 | Disposition: A | Discharge status: Home / Self Care | Payer: Medicare HMO | Source: Ambulatory Visit | Attending: Student in an Organized Health Care Education/Training Program | Admitting: Student in an Organized Health Care Education/Training Program

## 2018-04-17 ENCOUNTER — Other Ambulatory Visit: Payer: Self-pay

## 2018-04-17 ENCOUNTER — Ambulatory Visit (HOSPITAL_BASED_OUTPATIENT_CLINIC_OR_DEPARTMENT_OTHER): Payer: Medicare HMO | Admitting: Student in an Organized Health Care Education/Training Program

## 2018-04-17 ENCOUNTER — Encounter: Payer: Self-pay | Admitting: Student in an Organized Health Care Education/Training Program

## 2018-04-17 DIAGNOSIS — M1712 Unilateral primary osteoarthritis, left knee: Secondary | ICD-10-CM

## 2018-04-17 MED ORDER — FENTANYL CITRATE (PF) 100 MCG/2ML IJ SOLN
25.0000 ug | INTRAMUSCULAR | Status: DC | PRN
  Administered 2018-04-17: 50 ug via INTRAVENOUS
  Filled 2018-04-17: qty 2

## 2018-04-17 MED ORDER — LACTATED RINGERS IV SOLN
1000.0000 mL | Freq: Once | INTRAVENOUS | Status: AC
  Administered 2018-04-17: 1000 mL via INTRAVENOUS

## 2018-04-17 MED ORDER — DEXAMETHASONE SODIUM PHOSPHATE 10 MG/ML IJ SOLN
10.0000 mg | Freq: Once | INTRAMUSCULAR | Status: AC
  Administered 2018-04-17: 10 mg
  Filled 2018-04-17: qty 1

## 2018-04-17 MED ORDER — LIDOCAINE HCL 2 % IJ SOLN
20.0000 mL | Freq: Once | INTRAMUSCULAR | Status: AC
  Administered 2018-04-17: 400 mg
  Filled 2018-04-17: qty 20

## 2018-04-17 MED ORDER — ROPIVACAINE HCL 2 MG/ML IJ SOLN
10.0000 mL | Freq: Once | INTRAMUSCULAR | Status: AC
  Administered 2018-04-17: 10 mL
  Filled 2018-04-17: qty 10

## 2018-04-17 NOTE — Progress Notes (Signed)
Patient's Name: Allison Hall  MRN: [id number]  Referring Provider: Gillis Santa, MD  DOB: 19-Nov-1940  PCP: Clarisse Gouge, MD  DOS: 04/17/2018  Note by: Gillis Santa, MD  Service setting: Ambulatory outpatient  Specialty: Interventional Pain Management  Patient type: Established  Location: [facility] (AMB) Pain Management Facility  Visit type: Interventional Procedure   Primary Reason for Visit: Interventional Pain Management Treatment. CC: Knee Pain (left)  Procedure:          Anesthesia, Analgesia, Anxiolysis:  Type: Therapeutic Superior-lateral, Superior-medial, and Inferior-medial, Genicular Nerve Radiofrequency Ablation.  #1  Region: Lateral, Anterior, and Medial aspects of the knee joint, above and below the knee joint proper. Level: Superior and inferior to the knee joint. Laterality: Left  Type: Moderate (Conscious) Sedation combined with Local Anesthesia Indication(s): Analgesia and Anxiety Route: Intravenous (IV) IV Access: Secured Sedation: Meaningful verbal contact was maintained at all times during the procedure  Local Anesthetic: Lidocaine 1-2%  Position: Supine   Indications: 1. Primary osteoarthritis of left knee    Allison Hall has been dealing with the above chronic pain for longer than three months and has either failed to respond, was unable to tolerate, or simply did not get enough benefit from other more conservative therapies including, but not limited to: 1. Over-the-counter medications 2. Anti-inflammatory medications 3. Muscle relaxants 4. Membrane stabilizers 5. Opioids 6. Physical therapy and/or chiropractic manipulation 7. Modalities (Heat, ice, etc.) 8. Invasive techniques such as nerve blocks. Allison Hall has attained more than 50% relief of the pain from a series of diagnostic injections conducted in separate occasions.  Pain Score: Pre-procedure: 2 /10 Post-procedure: 0-No pain/10  Pre-op Assessment:  Allison Hall is a 78 y.o. (year  old), female patient, seen today for interventional treatment. She  has a past surgical history that includes Abdominal hysterectomy; Appendectomy; Nose surgery; Replacement total knee (Right, 2008); Partial mastectomy with needle localization (Right, 09/29/2016); Sentinel node biopsy (Right, 09/29/2016); Breast lumpectomy (Right, 10/09/2016); Breast biopsy (Right, 08/24/2016); Breast biopsy (Right, 08/24/2016); Colonoscopy w/ polypectomy (2003, 2008, 2014); Esophagogastroduodenoscopy; Joint replacement (Right, 2008); Colonoscopy with propofol (N/A, 12/03/2017); and Esophagogastroduodenoscopy (egd) with propofol (N/A, 12/03/2017). Allison Hall has a current medication list which includes the following prescription(s): acetaminophen, amlodipine, bupropion, buspirone, calcium carbonate, vitamin d3, denosumab, exemestane, guaifenesin-codeine, lisinopril, loperamide, magnesium, omeprazole, rosuvastatin, sertraline, turmeric curcumin, vitamin b-12, benzonatate, doxycycline, and lidocaine, and the following Facility-Administered Medications: fentanyl. Her primarily concern today is the Knee Pain (left)  Initial Vital Signs:  Pulse/HCG Rate: 91ECG Heart Rate: 89 Temp: 98.4 F (36.9 C) Resp: 18 BP: 126/68 SpO2: 100 %  BMI: Estimated body mass index is 33.28 kg/m as calculated from the following:   Height as of this encounter: 5\' 5"  (1.651 m).   Weight as of this encounter: 200 lb (90.7 kg).  Risk Assessment: Allergies: Reviewed. She is allergic to sulfamethoxazole; statins; trazodone; ciprofloxacin; and sulfa antibiotics.  Allergy Precautions: None required Coagulopathies: Reviewed. None identified.  Blood-thinner therapy: None at this time Active Infection(s): Reviewed. None identified. Allison Hall is afebrile  Site Confirmation: Allison Hall was asked to confirm the procedure and laterality before marking the site Procedure checklist: Completed Consent: Before the procedure and under the influence of  no sedative(s), amnesic(s), or anxiolytics, the patient was informed of the treatment options, risks and possible complications. To fulfill our ethical and legal obligations, as recommended by the American Medical Association's Code of Ethics, I have informed the patient of my clinical impression; the nature and purpose of the treatment  or procedure; the risks, benefits, and possible complications of the intervention; the alternatives, including doing nothing; the risk(s) and benefit(s) of the alternative treatment(s) or procedure(s); and the risk(s) and benefit(s) of doing nothing. The patient was provided information about the general risks and possible complications associated with the procedure. These may include, but are not limited to: failure to achieve desired goals, infection, bleeding, organ or nerve damage, allergic reactions, paralysis, and death. In addition, the patient was informed of those risks and complications associated to the procedure, such as failure to decrease pain; infection; bleeding; organ or nerve damage with subsequent damage to sensory, motor, and/or autonomic systems, resulting in permanent pain, numbness, and/or weakness of one or several areas of the body; allergic reactions; (i.e.: anaphylactic reaction); and/or death. Furthermore, the patient was informed of those risks and complications associated with the medications. These include, but are not limited to: allergic reactions (i.e.: anaphylactic or anaphylactoid reaction(s)); adrenal axis suppression; blood sugar elevation that in diabetics may result in ketoacidosis or comma; water retention that in patients with history of congestive heart failure may result in shortness of breath, pulmonary edema, and decompensation with resultant heart failure; weight gain; swelling or edema; medication-induced neural toxicity; particulate matter embolism and blood vessel occlusion with resultant organ, and/or nervous system infarction;  and/or aseptic necrosis of one or more joints. Finally, the patient was informed that Medicine is not an exact science; therefore, there is also the possibility of unforeseen or unpredictable risks and/or possible complications that may result in a catastrophic outcome. The patient indicated having understood very clearly. We have given the patient no guarantees and we have made no promises. Enough time was given to the patient to ask questions, all of which were answered to the patient's satisfaction. Allison Hall has indicated that she wanted to continue with the procedure. Attestation: I, the ordering provider, attest that I have discussed with the patient the benefits, risks, side-effects, alternatives, likelihood of achieving goals, and potential problems during recovery for the procedure that I have provided informed consent. Date  Time: 04/17/2018  7:48 AM  Pre-Procedure Preparation:  Monitoring: As per clinic protocol. Respiration, ETCO2, SpO2, BP, heart rate and rhythm monitor placed and checked for adequate function Safety Precautions: Patient was assessed for positional comfort and pressure points before starting the procedure. Time-out: I initiated and conducted the "Time-out" before starting the procedure, as per protocol. The patient was asked to participate by confirming the accuracy of the "Time Out" information. Verification of the correct person, site, and procedure were performed and confirmed by me, the nursing staff, and the patient. "Time-out" conducted as per Joint Commission's Universal Protocol (UP.01.01.01). Time: 8413  Description of Procedure:          Target Area: For Genicular Nerve block(s), the targets are: the superior-lateral genicular nerve, located in the lateral distal portion of the femoral shaft as it curves to form the lateral epicondyle, in the region of the distal femoral metaphysis; the superior-medial genicular nerve, located in the medial distal portion of  the femoral shaft as it curves to form the medial epicondyle; and the inferior-medial genicular nerve, located in the medial, proximal portion of the tibial shaft, as it curves to form the medial epicondyle, in the region of the proximal tibial metaphysis. Approach: Anterior, ipsilateral approach. Area Prepped: Entire knee area, from mid-thigh to mid-shin, lateral, anterior, and medial aspects. Prepping solution: Hibiclens (4.0% Chlorhexidine gluconate solution) Safety Precautions: Aspiration looking for blood return was conducted prior to all  injections. At no point did we inject any substances, as a needle was being advanced. No attempts were made at seeking any paresthesias. Safe injection practices and needle disposal techniques used. Medications properly checked for expiration dates. SDV (single dose vial) medications used. Description of the Procedure: Protocol guidelines were followed. The patient was placed in position over the procedure table. The target area was identified and the area prepped in the usual manner. The skin and muscle were infiltrated with local anesthetic. Appropriate amount of time allowed to pass for local anesthetics to take effect. Radiofrequency needles were introduced to the target area using fluoroscopic guidance. Using the NeuroTherm NT1100 Radiofrequency Generator, sensory stimulation using 50 Hz was used to locate & identify the nerve, making sure that the needle was positioned such that there was no sensory stimulation below 0.3 V or above 0.7 V. Stimulation using 2 Hz was used to evaluate the motor component. Care was taken not to lesion any nerves that demonstrated motor stimulation of the lower extremities at an output of less than 2.5 times that of the sensory threshold, or a maximum of 2.0 V. Once satisfactory placement of the needles was achieved, the numbing solution was slowly injected after negative aspiration. After waiting for at least 2 minutes, the ablation was  performed at 80 degrees C for 60 seconds, using regular Radiofrequency settings. Once the procedure was completed, the needles were then removed and the area cleansed, making sure to leave some of the prepping solution back to take advantage of its long term bactericidal properties. Intra-operative Compliance: Compliant Vitals:   04/17/18 0907 04/17/18 0916 04/17/18 0926 04/17/18 0936  BP: (!) 146/82 (!) 139/96 134/86 135/73  Pulse:      Resp: 16 16 14 16   Temp:  (!) 97.5 F (36.4 C)  97.7 F (36.5 C)  TempSrc:      SpO2: 99% 100% 100% 98%  Weight:      Height:        Start Time: 0835 hrs. End Time: 0906 hrs. Materials & Medications:  Needle(s) Type: Teflon-coated, curved tip, Radiofrequency needle(s) Gauge: 22G Length: 10cm Medication(s): Please see orders for medications and dosing details. 2 cc of lidocaine injected at each level prior to lesioning.  After lesioning 3 cc solution made of 2 cc of 0.2% prilocaine, 1 cc of Decadron 10 mg/cc; 1 cc was injected at each level Imaging Guidance (Non-Spinal):          Type of Imaging Technique: Fluoroscopy Guidance (Non-Spinal) Indication(s): Assistance in needle guidance and placement for procedures requiring needle placement in or near specific anatomical locations not easily accessible without such assistance. Exposure Time: Please see nurses notes. Contrast: Before injecting any contrast, we confirmed that the patient did not have an allergy to iodine, shellfish, or radiological contrast. Once satisfactory needle placement was completed at the desired level, radiological contrast was injected. Contrast injected under live fluoroscopy. No contrast complications. See chart for type and volume of contrast used. Fluoroscopic Guidance: I was personally present during the use of fluoroscopy. "Tunnel Vision Technique" used to obtain the best possible view of the target area. Parallax error corrected before commencing the procedure.  "Direction-depth-direction" technique used to introduce the needle under continuous pulsed fluoroscopy. Once target was reached, antero-posterior, oblique, and lateral fluoroscopic projection used confirm needle placement in all planes. Images permanently stored in EMR. Interpretation: I personally interpreted the imaging intraoperatively. Adequate needle placement confirmed in multiple planes. Appropriate spread of contrast into desired area was observed. No  evidence of afferent or efferent intravascular uptake. Permanent images saved into the patient's record.  Antibiotic Prophylaxis:   Anti-infectives (From admission, onward)   None     Indication(s): None identified  Post-operative Assessment:  Post-procedure Vital Signs:  Pulse/HCG Rate: 9178 Temp: 97.7 F (36.5 C) Resp: 16 BP: 135/73 SpO2: 98 %  EBL: None  Complications: No immediate post-treatment complications observed by team, or reported by patient.  Note: The patient tolerated the entire procedure well. A repeat set of vitals were taken after the procedure and the patient was kept under observation following institutional policy, for this type of procedure. Post-procedural neurological assessment was performed, showing return to baseline, prior to discharge. The patient was provided with post-procedure discharge instructions, including a section on how to identify potential problems. Should any problems arise concerning this procedure, the patient was given instructions to immediately contact us, at any time, without hesitation. In any case, we plan to contact the patient by telephone for a follow-up status report regarding this interventional procedure.  Comments:  No additional relevant information.  Plan of Care    Imaging Orders     DG C-Arm 1-60 Min-No Report Procedure Orders    No procedure(s) ordered today    Medications ordered for procedure: Meds ordered this encounter  Medications  . lactated ringers  infusion 1,000 mL  . fentaNYL (SUBLIMAZE) injection 25-100 mcg    Make sure Narcan is available in the pyxis when using this medication. In the event of respiratory depression (RR< 8/min): Titrate NARCAN (naloxone) in increments of 0.1 to 0.2 mg IV at 2-3 minute intervals, until desired degree of reversal.  . ropivacaine (PF) 2 mg/mL (0.2%) (NAROPIN) injection 10 mL  . lidocaine (XYLOCAINE) 2 % (with pres) injection 400 mg  . dexamethasone (DECADRON) injection 10 mg   Medications administered: We administered lactated ringers, fentaNYL, ropivacaine (PF) 2 mg/mL (0.2%), lidocaine, and dexamethasone.  See the medical record for exact dosing, route, and time of administration.  Disposition: Discharge home  Discharge Date & Time: 04/17/2018; 0939 hrs.     Future Appointments  Date Time Provider [place]  05/14/2018 10:45 AM Gillis Santa, MD [facility] None  06/04/2018  2:00 PM [facility] None  06/05/2018  1:30 PM Lequita Asal, MD [facility] None  06/05/2018  2:00 PM [facility] INFUSION CHAIR 4 [facility] None  01/16/2019  2:30 PM Chrystal, Eulas Post, MD [facility] None   Primary Care Physician: Clarisse Gouge, MD Location: [facility] Note by: Gillis Santa, MD Date: 04/17/2018; Time: 10:24 AM  Disclaimer:  Medicine is not an exact science. The only guarantee in medicine is that nothing is guaranteed. It is important to note that the decision to proceed with this intervention was based on the information collected from the patient. The Data and conclusions were drawn from the patient's questionnaire, the interview, and the physical examination. Because the information was provided in large part by the patient, it cannot be guaranteed that it has not been purposely or unconsciously manipulated. Every effort has been made to obtain as much relevant data as possible for this evaluation. It is important to note that the conclusions that lead to this  procedure are derived in large part from the available data. Always take into account that the treatment will also be dependent on availability of resources and existing treatment guidelines, considered by other Pain Management Practitioners as being common knowledge and practice, at the time of the intervention. For Medico-Legal purposes, it  is also important to point out that variation in procedural techniques and pharmacological choices are the acceptable norm. The indications, contraindications, technique, and results of the above procedure should only be interpreted and judged by a Board-Certified Interventional Pain Specialist with extensive familiarity and expertise in the same exact procedure and technique.

## 2018-04-17 NOTE — Patient Instructions (Signed)

## 2018-04-17 NOTE — Progress Notes (Signed)
Safety precautions to be maintained throughout the outpatient stay will include: orient to surroundings, keep bed in low position, maintain call bell within reach at all times, provide assistance with transfer out of bed and ambulation.  

## 2018-04-18 ENCOUNTER — Telehealth: Payer: Self-pay | Admitting: *Deleted

## 2018-04-18 NOTE — Telephone Encounter (Signed)
Patient states doing well this morning with no complications.

## 2018-04-22 ENCOUNTER — Other Ambulatory Visit: Payer: Self-pay | Admitting: Urgent Care

## 2018-04-22 MED ORDER — EXEMESTANE 25 MG PO TABS: 25.0000 mg | ORAL_TABLET | Freq: Every day | ORAL | 3 refills | Status: DC

## 2018-05-14 ENCOUNTER — Other Ambulatory Visit: Payer: Self-pay

## 2018-05-14 ENCOUNTER — Ambulatory Visit
Payer: Medicare HMO | Attending: Student in an Organized Health Care Education/Training Program | Admitting: Student in an Organized Health Care Education/Training Program

## 2018-05-14 ENCOUNTER — Encounter: Payer: Self-pay | Admitting: Student in an Organized Health Care Education/Training Program

## 2018-05-14 VITALS — BP 109/80 | HR 94 | Temp 98.0°F | Resp 18 | Ht 65.0 in | Wt 200.0 lb

## 2018-05-14 DIAGNOSIS — G8929 Other chronic pain: Secondary | ICD-10-CM | POA: Insufficient documentation

## 2018-05-14 DIAGNOSIS — M25562 Pain in left knee: Secondary | ICD-10-CM | POA: Insufficient documentation

## 2018-05-14 DIAGNOSIS — G894 Chronic pain syndrome: Secondary | ICD-10-CM | POA: Insufficient documentation

## 2018-05-14 DIAGNOSIS — M1712 Unilateral primary osteoarthritis, left knee: Secondary | ICD-10-CM | POA: Diagnosis not present

## 2018-05-14 NOTE — Progress Notes (Signed)
Safety precautions to be maintained throughout the outpatient stay will include: orient to surroundings, keep bed in low position, maintain call bell within reach at all times, provide assistance with transfer out of bed and ambulation.  

## 2018-05-14 NOTE — Progress Notes (Signed)
Patient's Name: Allison Hall  MRN: [id number]  Referring Provider: Clarisse Gouge, MD  DOB: [birth year]  PCP: Clarisse Gouge, MD  DOS: 05/14/2018  Note by: Gillis Santa, MD  Service setting: Ambulatory outpatient  Specialty: Interventional Pain Management  Location: [facility] (AMB) Pain Management Facility    Patient type: Established   Primary Reason(s) for Visit: Encounter for post-procedure evaluation of chronic illness with mild to moderate exacerbation CC: Knee Pain (left)  HPI  Allison Hall is a 78 y.o. year old, female patient, who comes today for a post-procedure evaluation. She has Anxiety; Colon polyp; Esophageal foreign body; Acid reflux; BP (high blood pressure); Hypercholesterolemia; Long term current use of opiate analgesic; Arthritis, degenerative; Arthritis of knee, degenerative; Hamstring muscle strain; Triggering of digit; Closed fracture of distal end of ulna; Breast cancer of upper-outer quadrant of right female breast ([place]); Osteoporosis; Arthralgia; Iron deficiency anemia; B12 deficiency; Chronic pain of left knee; and Chronic pain syndrome on their problem list. Her primarily concern today is the Knee Pain (left)  Pain Assessment: Location: Left Knee Radiating:   Onset: More than a month ago Duration: Chronic pain Quality:   Severity: 0-No pain/10 (subjective, self-reported pain score)  Note: Reported level is compatible with observation.                         When using our objective Pain Scale, levels between 6 and 10/10 are said to belong in an emergency room, as it progressively worsens from a 6/10, described as severely limiting, requiring emergency care not usually available at an outpatient pain management facility. At a 6/10 level, communication becomes difficult and requires great effort. Assistance to reach the emergency department may be required. Facial flushing and profuse sweating along with potentially dangerous increases in heart rate and blood  pressure will be evident. Effect on ADL: Limits activities Timing: Occasional Modifying factors: rest, tylennol BP: 109/80  HR: 94  Allison Hall comes in today for post-procedure evaluation.  Further details on both, my assessment(s), as well as the proposed treatment plan, please see below.  Post-Procedure Assessment  04/17/2018 Procedure: LEFT KNEE RFA Pre-procedure pain score:  2/10 Post-procedure pain score: 0/10         Influential Factors: BMI: 33.28 kg/m Intra-procedural challenges: None observed.         Assessment challenges: None detected.              Reported side-effects: None.        Post-procedural adverse reactions or complications: None reported         Sedation: Please see nurses note. When no sedatives are used, the analgesic levels obtained are directly associated to the effectiveness of the local anesthetics. However, when sedation is provided, the level of analgesia obtained during the initial 1 hour following the intervention, is believed to be the result of a combination of factors. These factors may include, but are not limited to: 1. The effectiveness of the local anesthetics used. 2. The effects of the analgesic(s) and/or anxiolytic(s) used. 3. The degree of discomfort experienced by the patient at the time of the procedure. 4. The patients ability and reliability in recalling and recording the events. 5. The presence and influence of possible secondary gains and/or psychosocial factors. Reported result: Relief experienced during the 1st hour after the procedure: 100 % (Ultra-Short Term Relief)            Interpretative annotation: Clinically appropriate result. Analgesia during this  period is likely to be Local Anesthetic and/or IV Sedative (Analgesic/Anxiolytic) related.          Effects of local anesthetic: The analgesic effects attained during this period are directly associated to the localized infiltration of local anesthetics and therefore cary  significant diagnostic value as to the etiological location, or anatomical origin, of the pain. Expected duration of relief is directly dependent on the pharmacodynamics of the local anesthetic used. Long-acting (4-6 hours) anesthetics used.  Reported result: Relief during the next 4 to 6 hour after the procedure: 100 % (Short-Term Relief)            Interpretative annotation: Clinically appropriate result. Analgesia during this period is likely to be Local Anesthetic-related.          Long-term benefit: Defined as the period of time past the expected duration of local anesthetics (1 hour for short-acting and 4-6 hours for long-acting). With the possible exception of prolonged sympathetic blockade from the local anesthetics, benefits during this period are typically attributed to, or associated with, other factors such as analgesic sensory neuropraxia, antiinflammatory effects, or beneficial biochemical changes provided by agents other than the local anesthetics.  Reported result: Extended relief following procedure: 33 %-50% (Long-Term Relief)            Interpretative annotation: Clinically possible results. Good relief. Therapeutic success. Inflammation plays a part in the etiology to the pain.          Current benefits: Defined as reported results that persistent at this point in time.   Analgesia: 50-75 %            Function: Somewhat improved ROM: Somewhat improved Interpretative annotation: Good relief. Therapeutic benefit observed. Adequate RF ablation.          Interpretation: Results would suggest adequate radiofrequency ablation.                  Plan:  Please see "Plan of Care" for details.                Laboratory Chemistry  Inflammation Markers (CRP: Acute Phase) (ESR: Chronic Phase) No results found for: CRP, ESRSEDRATE, LATICACIDVEN                       Rheumatology Markers No results found for: RF, ANA, LABURIC, URICUR, LYMEIGGIGMAB, LYMEABIGMQN, HLAB27                       Renal Function Markers Lab Results  Component Value Date   BUN 41 (H) 02/28/2018   CREATININE 1.21 (H) 02/28/2018   GFRAA 50 (L) 02/28/2018   GFRNONAA 43 (L) 02/28/2018                             Hepatic Function Markers Lab Results  Component Value Date   AST 23 08/29/2017   ALT 17 08/29/2017   ALBUMIN 4.8 08/29/2017   ALKPHOS 55 08/29/2017                        Electrolytes Lab Results  Component Value Date   NA 137 02/28/2018   K 3.9 02/28/2018   CL 101 02/28/2018   CALCIUM 9.5 02/28/2018                        Neuropathy Markers Lab Results  Component Value Date  VITAMINB12 888 01/29/2018   FOLATE 8.4 08/29/2017                        CNS Tests No results found for: COLORCSF, APPEARCSF, RBCCOUNTCSF, WBCCSF, POLYSCSF, LYMPHSCSF, EOSCSF, PROTEINCSF, GLUCCSF, JCVIRUS, CSFOLI, IGGCSF                      Bone Pathology Markers No results found for: VD25OH, [id number], [id number], [id number], 25OHVITD1, 25OHVITD2, 25OHVITD3, TESTOFREE, TESTOSTERONE                       Coagulation Parameters Lab Results  Component Value Date   PLT 214 01/29/2018                        Cardiovascular Markers Lab Results  Component Value Date   TROPONINI <0.03 12/09/2017   HGB 12.7 01/29/2018   HCT 38.9 01/29/2018                         CA Markers Lab Results  Component Value Date   LABCA2 13.4 09/06/2016                        Endocrine Markers Lab Results  Component Value Date   TSH 1.522 08/29/2017                        Note: Lab results reviewed.  Recent Diagnostic Imaging Results  DG C-Arm 1-60 Min-No Report Fluoroscopy was utilized by the requesting physician.  No radiographic  interpretation.   Complexity Note: Imaging results reviewed. Results shared with Allison Hall, using Layman's terms.                         Meds   Current Outpatient Medications:  .  acetaminophen (TYLENOL) 500 MG tablet, Take 500 mg by mouth 2 (two) times daily.,  Disp: , Rfl:  .  amLODipine (NORVASC) 10 MG tablet, Take 10 mg by mouth daily., Disp: , Rfl:  .  benzonatate (TESSALON) 200 MG capsule, Take one cap TID PRN cough, Disp: 30 capsule, Rfl: 0 .  buPROPion (WELLBUTRIN XL) 300 MG 24 hr tablet, Take 300 mg by mouth daily., Disp: , Rfl:  .  busPIRone (BUSPAR) 10 MG tablet, Take 10 mg by mouth 3 (three) times daily., Disp: , Rfl:  .  calcium carbonate 1250 MG capsule, Take 1,250 mg by mouth 2 (two) times daily with a meal., Disp: , Rfl:  .  Cholecalciferol (VITAMIN D3) 1000 units CAPS, Take 1,000 Units by mouth 2 (two) times daily., Disp: , Rfl:  .  denosumab (PROLIA) 60 MG/ML SOLN injection, Inject 60 mg into the skin every 6 (six) months. Administer in upper arm, thigh, or abdomen, Disp: , Rfl:  .  exemestane (AROMASIN) 25 MG tablet, Take 1 tablet (25 mg total) by mouth daily after breakfast., Disp: 90 tablet, Rfl: 3 .  guaiFENesin-codeine (CHERATUSSIN AC) 100-10 MG/5ML syrup, Take 5 mLs by mouth 3 (three) times daily as needed for cough., Disp: 120 mL, Rfl: 0 .  lidocaine (XYLOCAINE) 2 % solution, 20 ml gargle and spit q 6 hours prn sore throat, Disp: 100 mL, Rfl: 0 .  lisinopril (PRINIVIL,ZESTRIL) 40 MG tablet, Take 40 mg by mouth daily., Disp: , Rfl:  .  loperamide (IMODIUM) 2 MG  capsule, Take 2 mg by mouth as needed for diarrhea or loose stools. , Disp: , Rfl:  .  Magnesium 250 MG TABS, Take 2 tablets by mouth daily., Disp: , Rfl:  .  omeprazole (PRILOSEC) 40 MG capsule, Take 20 mg by mouth. , Disp: , Rfl:  .  rosuvastatin (CRESTOR) 5 MG tablet, Take 5 mg by mouth daily., Disp: , Rfl:  .  sertraline (ZOLOFT) 100 MG tablet, Take 1.5 tablets (150 mg total) by mouth daily. (Patient taking differently: Take 100-150 mg by mouth See admin instructions. Takes 100 mg daily except on Monday, Wednesday and Fridays and takes 150 mg (1.5 tablet)), Disp: 45 tablet, Rfl: 1 .  Turmeric Curcumin 500 MG CAPS, Take 1,500 mg by mouth daily. , Disp: , Rfl:  .  vitamin  B-12 (CYANOCOBALAMIN) 500 MCG tablet, Take 500 mcg by mouth daily., Disp: , Rfl:  .  doxycycline (VIBRAMYCIN) 100 MG capsule, Take 1 capsule (100 mg total) by mouth 2 (two) times daily. (Patient not taking: Reported on 04/17/2018), Disp: 14 capsule, Rfl: 0  ROS  Constitutional: Denies any fever or chills Gastrointestinal: No reported hemesis, hematochezia, vomiting, or acute GI distress Musculoskeletal: Denies any acute onset joint swelling, redness, loss of ROM, or weakness Neurological: No reported episodes of acute onset apraxia, aphasia, dysarthria, agnosia, amnesia, paralysis, loss of coordination, or loss of consciousness  Allergies  Allison Hall is allergic to sulfamethoxazole; statins; trazodone; ciprofloxacin; and sulfa antibiotics.  [place]  Drug: Allison Hall  reports no history of drug use. Alcohol:  reports current alcohol use of about 7.0 standard drinks of alcohol per week. Tobacco:  reports that she quit smoking about 53 years ago. She has a 10.00 pack-year smoking history. She has never used smokeless tobacco. Medical:  has a past medical history of Anxiety, Breast cancer ([employer]) (08/24/2016), Cancer [facility]), Colon polyp, Depression, GERD (gastroesophageal reflux disease), Hypercholesteremia, Hypertension, Osteoarthritis, Personal history of radiation therapy, and Thyroid nodule (04/17/2016). Surgical: Allison Hall  has a past surgical history that includes Abdominal hysterectomy; Appendectomy; Nose surgery; Replacement total knee (Right, 2008); Partial mastectomy with needle localization (Right, 09/29/2016); Sentinel node biopsy (Right, 09/29/2016); Breast lumpectomy (Right, 10/09/2016); Breast biopsy (Right, 08/24/2016); Breast biopsy (Right, 08/24/2016); Colonoscopy w/ polypectomy (2003, 2008, 2014); Esophagogastroduodenoscopy; Joint replacement (Right, 2008); Colonoscopy with propofol (N/A, 12/03/2017); and Esophagogastroduodenoscopy (egd) with propofol (N/A, 12/03/2017). Family: family  history includes Breast cancer in her cousin, cousin, and cousin; Depression in her brother and mother; Heart disease in her brother; Heart failure in her father; Hypertension in her mother; Obesity in her brother; Stroke in her brother.  Constitutional Exam  General appearance: Well nourished, well developed, and well hydrated. In no apparent acute distress Vitals:   05/14/18 1040  BP: 109/80  Pulse: 94  Resp: 18  Temp: 98 F (36.7 C)  SpO2: 99%  Weight: 200 lb (90.7 kg)  Height: '5\' 5"'$  (1.651 m)   BMI Assessment: Estimated body mass index is 33.28 kg/m as calculated from the following:   Height as of this encounter: '5\' 5"'$  (1.651 m).   Weight as of this encounter: 200 lb (90.7 kg).  BMI interpretation table: BMI level Category Range association with higher incidence of chronic pain  <18 kg/m2 Underweight   18.5-24.9 kg/m2 Ideal body weight   25-29.9 kg/m2 Overweight Increased incidence by 20%  30-34.9 kg/m2 Obese (Class I) Increased incidence by 68%  35-39.9 kg/m2 Severe obesity (Class II) Increased incidence by 136%  >40 kg/m2 Extreme obesity (Class III) Increased  incidence by 254%   Patient's current BMI Ideal Body weight  Body mass index is 33.28 kg/m. Ideal body weight: 57 kg (125 lb 10.6 oz) Adjusted ideal body weight: 70.5 kg (155 lb 6.4 oz)   BMI Readings from Last 4 Encounters:  05/14/18 33.28 kg/m  04/17/18 33.28 kg/m  04/04/18 33.28 kg/m  03/12/18 33.28 kg/m   Wt Readings from Last 4 Encounters:  05/14/18 200 lb (90.7 kg)  04/17/18 200 lb (90.7 kg)  04/04/18 200 lb (90.7 kg)  03/12/18 200 lb (90.7 kg)  Psych/Mental status: Alert, oriented x 3 (person, place, & time)       Eyes: PERLA Respiratory: No evidence of acute respiratory distress  Thoracic Spine Area Exam  Skin & Axial Inspection: No masses, redness, or swelling Alignment: Symmetrical Functional ROM: Unrestricted ROM Stability: No instability detected Muscle Tone/Strength: Functionally  intact. No obvious neuro-muscular anomalies detected. Sensory (Neurological): Unimpaired Muscle strength & Tone: No palpable anomalies  Lumbar Spine Area Exam  Skin & Axial Inspection: No masses, redness, or swelling Alignment: Symmetrical Functional ROM: Unrestricted ROM       Stability: No instability detected Muscle Tone/Strength: Functionally intact. No obvious neuro-muscular anomalies detected. Sensory (Neurological): Unimpaired Palpation: No palpable anomalies       Provocative Tests: Hyperextension/rotation test: deferred today       Lumbar quadrant test (Kemp's test): deferred today       Lateral bending test: deferred today       Patrick's Maneuver: deferred today                   FABER* test: deferred today                   S-I anterior distraction/compression test: deferred today         S-I lateral compression test: deferred today         S-I Thigh-thrust test: deferred today         S-I Gaenslen's test: deferred today         *(Flexion, ABduction and External Rotation)  Gait & Posture Assessment  Ambulation: Unassisted Gait: Relatively normal for age and body habitus Posture: WNL   Lower Extremity Exam    Side: Right lower extremity  Side: Left lower extremity  Stability: No instability observed          Stability: No instability observed          Skin & Extremity Inspection: Evidence of prior arthroplastic surgery  Skin & Extremity Inspection: Skin color, temperature, and hair growth are WNL. No peripheral edema or cyanosis. No masses, redness, swelling, asymmetry, or associated skin lesions. No contractures.  Functional ROM: Unrestricted ROM                  Functional ROM: Improved after treatment                  Muscle Tone/Strength: Functionally intact. No obvious neuro-muscular anomalies detected.  Muscle Tone/Strength: Functionally intact. No obvious neuro-muscular anomalies detected.  Sensory (Neurological): Arthropathic arthralgia        Sensory  (Neurological): Improved        DTR: Patellar: deferred today Achilles: deferred today Plantar: deferred today  DTR: Patellar: deferred today Achilles: deferred today Plantar: deferred today  Palpation: No palpable anomalies  Palpation: No palpable anomalies   Assessment   Status Diagnosis  Controlled Controlled Controlled 1. Primary osteoarthritis of left knee   2. Chronic pain of left knee  3. Chronic pain syndrome      Follows up s/p left knee genicular RFA. Endorses improvement in pain and functional status. Continues to have lateral pain with weight bearing but overall pain improved since radiofrequency ablation of genicular nerves.  Can repeat after 6 months depending on pain relief.  Will also place order for PRN left knee steroid injection if patient has worsening knee pain prior to 6 month interval for repeating genicular nerve RFA.  Plan of Care   Lab-work, procedure(s), and/or referral(s): Orders Placed This Encounter  Procedures  . KNEE INJECTION   PRN Procedures:   Knee steroid injection   Provider-requested follow-up: Return if symptoms worsen or fail to improve.  Time Note: Greater than 50% of the 25 minute(s) of face-to-face time spent with Allison Hall, was spent in counseling/coordination of care regarding: Allison Hall primary cause of pain, the treatment plan, treatment alternatives, the results, interpretation and significance of  her recent diagnostic interventional treatment(s), realistic expectations and the goals of pain management (increased in functionality).  Future Appointments  Date Time Provider [place]  06/04/2018  2:00 PM [facility] LAB [facility] None  06/05/2018  1:30 PM Lequita Asal, MD [facility] None  06/05/2018  2:00 PM [facility] INFUSION CHAIR 4 [facility] None  01/16/2019  2:30 PM Chrystal, Eulas Post, MD [facility] None    Primary Care Physician: Clarisse Gouge, MD Location: [facility]  Facility Note by: Gillis Santa, M.D Date: 05/14/2018; Time: 2:31 PM  There are no Patient Instructions on file for this visit.

## 2018-06-04 ENCOUNTER — Inpatient Hospital Stay: Payer: Medicare HMO | Attending: Hematology and Oncology

## 2018-06-04 DIAGNOSIS — E538 Deficiency of other specified B group vitamins: Secondary | ICD-10-CM | POA: Insufficient documentation

## 2018-06-04 DIAGNOSIS — Z170 Estrogen receptor positive status [ER+]: Secondary | ICD-10-CM | POA: Diagnosis not present

## 2018-06-04 DIAGNOSIS — Z923 Personal history of irradiation: Secondary | ICD-10-CM | POA: Diagnosis not present

## 2018-06-04 DIAGNOSIS — M810 Age-related osteoporosis without current pathological fracture: Secondary | ICD-10-CM | POA: Diagnosis not present

## 2018-06-04 DIAGNOSIS — C50411 Malignant neoplasm of upper-outer quadrant of right female breast: Secondary | ICD-10-CM | POA: Diagnosis not present

## 2018-06-04 DIAGNOSIS — Z79899 Other long term (current) drug therapy: Secondary | ICD-10-CM | POA: Diagnosis not present

## 2018-06-04 DIAGNOSIS — D509 Iron deficiency anemia, unspecified: Secondary | ICD-10-CM | POA: Diagnosis not present

## 2018-06-04 DIAGNOSIS — Z79811 Long term (current) use of aromatase inhibitors: Secondary | ICD-10-CM | POA: Insufficient documentation

## 2018-06-04 LAB — CBC WITH DIFFERENTIAL/PLATELET
Abs Immature Granulocytes: 0.04 10*3/uL (ref 0.00–0.07)
Basophils Absolute: 0 10*3/uL (ref 0.0–0.1)
Basophils Relative: 1 %
Eosinophils Absolute: 0.1 10*3/uL (ref 0.0–0.5)
Eosinophils Relative: 2 %
HCT: 37.6 % (ref 36.0–46.0)
Hemoglobin: 12.7 g/dL (ref 12.0–15.0)
Immature Granulocytes: 1 %
Lymphocytes Relative: 23 %
Lymphs Abs: 1.3 10*3/uL (ref 0.7–4.0)
MCH: 32.5 pg (ref 26.0–34.0)
MCHC: 33.8 g/dL (ref 30.0–36.0)
MCV: 96.2 fL (ref 80.0–100.0)
Monocytes Absolute: 0.7 10*3/uL (ref 0.1–1.0)
Monocytes Relative: 12 %
Neutro Abs: 3.6 10*3/uL (ref 1.7–7.7)
Neutrophils Relative %: 61 %
Platelets: 208 10*3/uL (ref 150–400)
RBC: 3.91 MIL/uL (ref 3.87–5.11)
RDW: 13.5 % (ref 11.5–15.5)
WBC: 5.7 10*3/uL (ref 4.0–10.5)
nRBC: 0 % (ref 0.0–0.2)

## 2018-06-04 LAB — FERRITIN: Ferritin: 3 ng/mL — ABNORMAL LOW (ref 11–307)

## 2018-06-05 ENCOUNTER — Inpatient Hospital Stay (HOSPITAL_BASED_OUTPATIENT_CLINIC_OR_DEPARTMENT_OTHER): Payer: Medicare HMO | Admitting: Urgent Care

## 2018-06-05 ENCOUNTER — Other Ambulatory Visit: Payer: Self-pay

## 2018-06-05 ENCOUNTER — Encounter: Payer: Self-pay | Admitting: Urgent Care

## 2018-06-05 ENCOUNTER — Inpatient Hospital Stay: Payer: Medicare HMO

## 2018-06-05 VITALS — BP 114/73 | HR 83 | Temp 97.8°F | Resp 18

## 2018-06-05 VITALS — BP 128/70 | HR 87 | Temp 98.4°F | Resp 18 | Ht 65.0 in | Wt 207.6 lb

## 2018-06-05 DIAGNOSIS — C50411 Malignant neoplasm of upper-outer quadrant of right female breast: Secondary | ICD-10-CM | POA: Diagnosis not present

## 2018-06-05 DIAGNOSIS — M810 Age-related osteoporosis without current pathological fracture: Secondary | ICD-10-CM

## 2018-06-05 DIAGNOSIS — D509 Iron deficiency anemia, unspecified: Secondary | ICD-10-CM | POA: Diagnosis not present

## 2018-06-05 DIAGNOSIS — E538 Deficiency of other specified B group vitamins: Secondary | ICD-10-CM

## 2018-06-05 DIAGNOSIS — Z79899 Other long term (current) drug therapy: Secondary | ICD-10-CM

## 2018-06-05 DIAGNOSIS — Z79811 Long term (current) use of aromatase inhibitors: Secondary | ICD-10-CM

## 2018-06-05 DIAGNOSIS — Z923 Personal history of irradiation: Secondary | ICD-10-CM

## 2018-06-05 DIAGNOSIS — Z170 Estrogen receptor positive status [ER+]: Secondary | ICD-10-CM | POA: Diagnosis not present

## 2018-06-05 LAB — URINALYSIS, COMPLETE (UACMP) WITH MICROSCOPIC
BILIRUBIN URINE: NEGATIVE
Glucose, UA: NEGATIVE mg/dL
Hgb urine dipstick: NEGATIVE
Ketones, ur: NEGATIVE mg/dL
Nitrite: NEGATIVE
Protein, ur: NEGATIVE mg/dL
Specific Gravity, Urine: 1.015 (ref 1.005–1.030)
pH: 8.5 — ABNORMAL HIGH (ref 5.0–8.0)

## 2018-06-05 LAB — CANCER ANTIGEN 27.29: CA 27.29: 12.1 U/mL (ref 0.0–38.6)

## 2018-06-05 MED ORDER — SODIUM CHLORIDE 0.9 % IV SOLN
Freq: Once | INTRAVENOUS | Status: AC
  Administered 2018-06-05: 14:00:00 via INTRAVENOUS
  Filled 2018-06-05: qty 250

## 2018-06-05 MED ORDER — SODIUM CHLORIDE 0.9 % IV SOLN: 200.0000 mg | Freq: Once | INTRAVENOUS | Status: DC

## 2018-06-05 MED ORDER — IRON SUCROSE 20 MG/ML IV SOLN
200.0000 mg | Freq: Once | INTRAVENOUS | Status: AC
  Administered 2018-06-05: 200 mg via INTRAVENOUS
  Filled 2018-06-05: qty 10

## 2018-06-05 NOTE — Progress Notes (Signed)
[facility] day:  06/05/2018   Chief Complaint: Allison Hall is a 78 y.o. 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  HPI:  The patient was last seen in the medical oncology clinic on 01/30/2018.  At that time, patient was feeling well.  She noted more energy.  Patient was back to singing in the choir and enjoying life.  She denied shortness of breath.  No bleeding.  No B symptoms or interval infections.  No breast concerns.  Continues on Aromasin therapy.  Eating well; weight up 4 pounds.  Exam was grossly unremarkable.  WBC 7300 ([place] 4200).  Ferritin stable at 64 ng/mL.  B12 stable at 888 pg/mL.    Patient was seen in the [facility] urgent care on 02/06/2018 by Dr. Norval Gable.  Notes reviewed.  Patient diagnosed with strep pharyngitis.  She was prescribed a course Veetid 500 mg 3 times daily x10 days.  She was given 2% lidocaine solution to help with pain.  Patient was encouraged to follow PCP.  Patient was seen in the med urgent care on 03/04/2018 by Tamala Ser, PA.  Notes reviewed.  Patient again with complaints of sore throat following completion of her antibiotics.  Repeat strep test was negative. Additionally, she complained of cough.  Diagnosed with a viral URI, and was prescribed guaifenesin with codeine cough syrup and benzonatate to use PRN.  She was encouraged to use Flonase for the next 2 to 3 weeks to help with drainage.  Patient to follow-up with her PCP.  She was again seen in the [place] urgent care on 03/12/2018 by Dr. Thersa Salt.  Notes reviewed.  Patient continued to feel poorly after being diagnosed with viral URI on 03/05/2019.  She continued to have severe cough despite prescribed interventions.  Given continued symptoms despite conservative management, she was diagnosed with acute bronchitis and prescribed a seven-day course of  doxycycline.  Patient again encouraged to follow-up with her primary care physician.  Labs on 06/04/2018 revealed a WBC of 5700 ([place] 3600).  Hemoglobin 12.7, hematocrit 37.6, MCV 96.2, and platelets 208,000.  Ferritin had significantly decreased to 3 ng/mL. CA27.29 normal at 12.1 U/L.   Symptomatically, patient presents today with complaints of significant fatigue.  She denies any shortness of breath, chest pains, or episodes of palpitations.  Patient complains of chronic pain in her LEFT knee.  She notes that the pain relieves with rest.  Additionally, patient has been seen in consult by orthopedics and advised that she needs a knee replacement.  Patient is scheduled to see Dr. Jefm Bryant tomorrow in consult.  Patient denies that she has experienced any B symptoms. She notes several upper respiratory tract infections since her last visit.  Patient does not verbalize any concerns with regards to her breasts today. Patient performs monthly self breast examinations as recommended.  She remains on her prescribed endocrine therapy (Aromasin) as previously prescribed.  Patient advises that she maintains an adequate appetite. She is eating well. Weight today is 207 lb 9 oz (94.2 kg), which compared to her last visit to the clinic, represents a 1 pound decrease.  Patient denies pain in the clinic today.  Past Medical History:  Diagnosis Date  . Anxiety   . Breast cancer ([place]) 08/24/2016   right  . Cancer ([place])    skin ca  . Colon polyp   . Depression   .  GERD (gastroesophageal reflux disease)   . Hypercholesteremia   . Hypertension   . Osteoarthritis   . Personal history of radiation therapy   . Thyroid nodule 04/17/2016    Past Surgical History:  Procedure Laterality Date  . ABDOMINAL HYSTERECTOMY    . APPENDECTOMY    . BREAST BIOPSY Right 08/24/2016   9:30 - invasive mammary carcinoma with lobular features  . BREAST BIOPSY Right 08/24/2016   10:00 - invasive mammary carcinoma with lobular  features  . BREAST LUMPECTOMY Right 10/09/2016  . COLONOSCOPY W/ POLYPECTOMY  2003, 2008, 2014  . COLONOSCOPY WITH PROPOFOL N/A 12/03/2017   Procedure: COLONOSCOPY WITH PROPOFOL;  Surgeon: Manya Silvas, MD;  Location[facility] ENDOSCOPY;  Service: Endoscopy;  Laterality: N/A;  . ESOPHAGOGASTRODUODENOSCOPY     foreign body  . ESOPHAGOGASTRODUODENOSCOPY (EGD) WITH PROPOFOL N/A 12/03/2017   Procedure: ESOPHAGOGASTRODUODENOSCOPY (EGD) WITH PROPOFOL;  Surgeon: Manya Silvas, MD;  Location: [facility] ENDOSCOPY;  Service: Endoscopy;  Laterality: N/A;  . JOINT REPLACEMENT Right 2008   TKR  . NOSE SURGERY    . PARTIAL MASTECTOMY WITH NEEDLE LOCALIZATION Right 09/29/2016   Procedure: PARTIAL MASTECTOMY WITH NEEDLE LOCALIZATION;  Surgeon: [name], MD;  Location: [facility] ORS;  Service: General;  Laterality: Right;  . REPLACEMENT TOTAL KNEE Right 2008  . SENTINEL NODE BIOPSY Right 09/29/2016   Procedure: SENTINEL NODE BIOPSY;  Surgeon: [name], MD;  Location: [facility] ORS;  Service: General;  Laterality: Right;    Family History  Problem Relation Age of Onset  . Hypertension Mother   . Depression Mother   . Heart failure Father   . Obesity Brother   . Heart disease Brother   . Stroke Brother   . Depression Brother   . Breast cancer Cousin   . Breast cancer Cousin   . Breast cancer Cousin     Social History:  reports that she quit smoking about 53 years ago. She has a 10.00 pack-year smoking history. She has never used smokeless tobacco. She reports current alcohol use of about 7.0 standard drinks of alcohol per week. She reports that she does not use drugs.  She previously smoked 1/2 pack/day x 10 years.  She stopped smoking on 10/11/1978.  She drinks 6 glasses of wine/week.  She has 3 children (2 sons and 1 daughter).  She is a retired Clinical cytogeneticist for [employer].  She likes to do crossword puzzles.  Her daughter's name is [name].  Her husband is in an assisted living facility [facility]) secondary to dementia.  She lives in [place].  She is alone today.  Allergies:  Allergies  Allergen Reactions  . Sulfamethoxazole Other (See Comments)    Anxiety and imsomnia  . Statins Other (See Comments)    Muscle pain   . Trazodone Other (See Comments)    Pt states she felt "unsteady"   . Ciprofloxacin Anxiety  . Sulfa Antibiotics Anxiety    Current Medications: Current Outpatient Medications  Medication Sig Dispense Refill  . acetaminophen (TYLENOL) 500 MG tablet Take 500 mg by mouth 2 (two) times daily.    Marland Kitchen amLODipine (NORVASC) 10 MG tablet Take 10 mg by mouth daily.    Marland Kitchen buPROPion (WELLBUTRIN XL) 300 MG 24 hr tablet Take 300 mg by mouth daily.    . busPIRone (BUSPAR) 10 MG tablet Take 10 mg by mouth 3 (three) times daily.    . calcium carbonate 1250 MG capsule Take 1,250 mg by mouth 2 (two) times daily  with a meal.    . Cholecalciferol (VITAMIN D3) 1000 units CAPS Take 1,000 Units by mouth 2 (two) times daily.    Marland Kitchen exemestane (AROMASIN) 25 MG tablet Take 1 tablet (25 mg total) by mouth daily after breakfast. 90 tablet 3  . lisinopril (PRINIVIL,ZESTRIL) 40 MG tablet Take 40 mg by mouth daily.    . Magnesium 250 MG TABS Take 2 tablets by mouth daily.    Marland Kitchen omeprazole (PRILOSEC) 40 MG capsule Take 20 mg by mouth.     . rosuvastatin (CRESTOR) 5 MG tablet Take 5 mg by mouth daily.    . sertraline (ZOLOFT) 100 MG tablet Take 1.5 tablets (150 mg total) by mouth daily. (Patient taking differently: Take 100-150 mg by mouth See admin instructions. Takes 100 mg daily except on Monday, Wednesday and Fridays and takes 150 mg (1.5 tablet)) 45 tablet 1  . Turmeric Curcumin 500 MG CAPS Take 1,500 mg by mouth daily.     . vitamin B-12 (CYANOCOBALAMIN) 500 MCG tablet Take 500 mcg by mouth daily.    . benzonatate (TESSALON) 200 MG capsule Take one cap TID PRN cough (Patient not taking: Reported on 06/05/2018) 30 capsule 0  . denosumab (PROLIA) 60 MG/ML SOLN injection Inject 60 mg into the  skin every 6 (six) months. Administer in upper arm, thigh, or abdomen    . doxycycline (VIBRAMYCIN) 100 MG capsule Take 1 capsule (100 mg total) by mouth 2 (two) times daily. (Patient not taking: Reported on 04/17/2018) 14 capsule 0  . guaiFENesin-codeine (CHERATUSSIN AC) 100-10 MG/5ML syrup Take 5 mLs by mouth 3 (three) times daily as needed for cough. (Patient not taking: Reported on 06/05/2018) 120 mL 0  . lidocaine (XYLOCAINE) 2 % solution 20 ml gargle and spit q 6 hours prn sore throat (Patient not taking: Reported on 06/05/2018) 100 mL 0  . loperamide (IMODIUM) 2 MG capsule Take 2 mg by mouth as needed for diarrhea or loose stools.      No current facility-administered medications for this visit.     Review of Systems  Constitutional: Positive for malaise/fatigue and weight loss (down 1 pound). Negative for diaphoresis and fever.       "I am worn out. I feel so tired".   HENT: Negative.   Eyes: Negative.   Respiratory: Negative for cough, hemoptysis, sputum production and shortness of breath.   Cardiovascular: Negative for chest pain, palpitations, orthopnea, leg swelling and PND.  Gastrointestinal: Negative for abdominal pain, blood in stool, constipation, diarrhea, melena, nausea and vomiting.       Rectal leakage at times.   Genitourinary: Negative for dysuria, frequency, hematuria and urgency.  Musculoskeletal: Positive for joint pain (knees; some improvement with injections). Negative for back pain, falls and myalgias.  Skin: Negative for itching and rash.  Neurological: Negative for dizziness, tremors, weakness and headaches.       Poor balance.  Endo/Heme/Allergies: Does not bruise/bleed easily.  Psychiatric/Behavioral: Positive for depression. Negative for memory loss and suicidal ideas. The patient is not nervous/anxious and does not have insomnia.   All other systems reviewed and are negative.  Performance status (ECOG): 0 - Asymptomatic  Vital Signs BP 128/70 (BP Location:  Left Arm, Patient Position: Sitting)   Pulse 87   Temp 98.4 F (36.9 C) (Tympanic)   Resp 18   Ht 5' 5" (1.651 m)   Wt 207 lb 9 oz (94.2 kg)   SpO2 99%   BMI 34.54 kg/m   Physical Exam  Constitutional:  She is oriented to person, place, and time and well-developed, well-nourished, and in no distress. No distress.  HENT:  Head: Normocephalic and atraumatic.  Mouth/Throat: Oropharynx is clear and moist and mucous membranes are normal. No oropharyngeal exudate.  Eyes: Pupils are equal, round, and reactive to light. Conjunctivae and EOM are normal. No scleral icterus.  Neck: Normal range of motion. Neck supple. No JVD present.  Cardiovascular: Normal rate, regular rhythm, normal heart sounds and intact distal pulses. Exam reveals no gallop and no friction rub.  No murmur heard. Pulmonary/Chest: Effort normal and breath sounds normal. No respiratory distress. She has no wheezes. She has no rales. Right breast exhibits skin change (healed surgical incision 10-11 o'clock position. ). Right breast exhibits no inverted nipple, no mass, no nipple discharge and no tenderness. Left breast exhibits no inverted nipple, no mass, no nipple discharge, no skin change and no tenderness.  Abdominal: Soft. Bowel sounds are normal. She exhibits no distension and no mass. There is no abdominal tenderness. There is no rebound and no guarding.  Musculoskeletal: Normal range of motion.        General: No tenderness or edema.     Right knee: She exhibits bony tenderness.     Left knee: She exhibits bony tenderness.  Lymphadenopathy:    She has no cervical adenopathy.    She has no axillary adenopathy.       Right: No supraclavicular adenopathy present.       Left: No supraclavicular adenopathy present.  Neurological: She is alert and oriented to person, place, and time. Gait normal.  Skin: Skin is warm and dry. No rash noted. She is not diaphoretic. No erythema.  Psychiatric: Mood, affect and judgment normal.   Nursing note and vitals reviewed.   Appointment on 06/04/2018  Component Date Value Ref Range Status  . CA 27.29 06/04/2018 12.1  0.0 - 38.6 U/mL Final   Comment: (NOTE) Siemens Centaur Immunochemiluminometric Methodology [facility]) Values obtained with different assay methods or kits cannot be used interchangeably. Results cannot be interpreted as absolute evidence of the presence or absence of malignant disease. Performed At: [facility] [place], [place] [id number] Rush Farmer MD [id number]   . Ferritin 06/04/2018 3* 11 - 307 ng/mL Final   Performed at [facility], [address] [place], [place] [address]  . WBC 06/04/2018 5.7  4.0 - 10.5 K/uL Final  . RBC 06/04/2018 3.91  3.87 - 5.11 MIL/uL Final  . Hemoglobin 06/04/2018 12.7  12.0 - 15.0 g/dL Final  . HCT 06/04/2018 37.6  36.0 - 46.0 % Final  . MCV 06/04/2018 96.2  80.0 - 100.0 fL Final  . MCH 06/04/2018 32.5  26.0 - 34.0 pg Final  . MCHC 06/04/2018 33.8  30.0 - 36.0 g/dL Final  . RDW 06/04/2018 13.5  11.5 - 15.5 % Final  . Platelets 06/04/2018 208  150 - 400 K/uL Final  . nRBC 06/04/2018 0.0  0.0 - 0.2 % Final  . Neutrophils Relative % 06/04/2018 61  % Final  . Neutro Abs 06/04/2018 3.6  1.7 - 7.7 K/uL Final  . Lymphocytes Relative 06/04/2018 23  % Final  . Lymphs Abs 06/04/2018 1.3  0.7 - 4.0 K/uL Final  . Monocytes Relative 06/04/2018 12  % Final  . Monocytes Absolute 06/04/2018 0.7  0.1 - 1.0 K/uL Final  . Eosinophils Relative 06/04/2018 2  % Final  . Eosinophils Absolute 06/04/2018 0.1  0.0 - 0.5 K/uL Final  . Basophils  Relative 06/04/2018 1  % Final  . Basophils Absolute 06/04/2018 0.0  0.0 - 0.1 K/uL Final  . Immature Granulocytes 06/04/2018 1  % Final  . Abs Immature Granulocytes 06/04/2018 0.04  0.00 - 0.07 K/uL Final   Performed at [facility], [address], [place], [place] [address]    Assessment:  ANGELEEN HORNEY is a 78 y.o. female with  multi-focal right breast cancer s/p partial mastectomy with sentinel lymph node biopsy on 09/29/2016.  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  Three sentinel lymph nodes were negative.  Tumor was ER + (> 90%), PR + (> 90%) and Her2/neu 2+ (negative by FISH).  Pathologic stage was T1c(m) N0(sn).  CA27.29 was 13.4 on 09/06/2016.  MammaPrint revealed a 29% risk of recurrence (high).  She was seen for second opinion at [facility] on 10/25/2016.  Chemotherapy was not recommended.  Bilateral screening mammogram on 07/24/2016 revealed a possible area of distortion in the right breast. Diagnostic mammogram on 08/10/2016 revealed 2 suspicious masses in the upper-outer quadrant of the right breast in a broad area of confluent distortion in the upper-outer quadrant of the right breast.  There are at least 3 areas of confluent distortion spanning approximately 6.5 cm of tissue.  Ultrasound on 07/31/2016 revealed  an irregular hypoechoic 1.2 x 1.1 x 1.2 cm at the 9:30 position 7 cm from the nipple.  In addition, there was an irregular 1.9 x 0.8 x 1.2 cm mass at the 10 o'clock position 5 cm from the nipple.  There were multiple small normal-appearing lymph nodes.   Breast MRI on 09/16/2016 revealed an irregular 1.6 x 1.2 x 1.6 cm enhancing mass adjacent to the clip artifact within the upper, outer right breast, middle depth, corresponding to the mass seen at 10 o'clock. There was an additional irregular enhancing mass adjacent to the more posterior clip artifact measuring 2.6 x 1.4 x 1.6 cm, corresponding  to the mass seen at 9:30 position. Together, these 2 masses span 6.3 cm of breast tissue from anterior to posterior. There was no definite additional suspicious area of enhancement within the right breast to correspond to the third area of distortion identified on diagnostic mammogram dated 08/10/2016.  Left breast revealed  no suspicious mass or enhancement.  Bilateral mammogram on 08/28/2017 revealed no evidence of malignancy.  She received radiation from 11/15/2016 - 01/08/2017.  She was Femara (01/17/2017 - 05/16/2017) secondary to arthralgias.  She discontinued Arimidex after 19 days (last 06/14/2017) secondary to fatigue and polyarthralgias.  She began Aromasin on 06/27/2017.  CA27.29 has been followed:  13.4 on 09/06/2016, 13.0 on 05/16/2017, and 17.1 on 08/29/2017.  Bone density study on 09/30/2015 revealed osteopenia with a T-score of -2.1 in the right femoral neck.  Bone density study on 10/02/2017 that revealed a T-score of -3.1 consistent with osteoporosis. She began Prolia on 02/14/2017 (last 02/28/2018).  She was diagnosed with iron deficiency anemia and B12 deficiency on 08/29/2017.  TSH and folate were normal.  She is on oral B12 500 mcg a day.  Intrinsic factor and anti-parietal cell antibodies were normal on 01/02/2018.  B12 was 888 on 01/29/2018.  She received Venofer weekly x 3 (10/31/2017 - 11/13/2017).  Ferritin has been followed: 10 on 08/29/2017, 174 on 12/12/2017, 74 on 01/02/2018, 64 on 01/29/2018, and 3 on 06/04/2018.  EGD and colonoscopy on 12/03/2017. Polyps were noted. Pathology was negative for dysplasia  and malignancy.   Symptomatically, patient complains of increased fatigue.  She denies any shortness of breath, chest pain, or palpitations.  She has continued pain in her knees (L>R).  Is been advised in the past that she needs a replacement.  Patient following up with Dr. Jefm Bryant tomorrow to discuss.  Patient denies any bleeding.  No B symptoms.  Several upper respiratory tract infection since her last visit.  Patient denies breast concerns.  Remains on endocrine therapy (Aromasin) as previously prescribed.  Exam grossly unchanged from previous.   Plan: 1. Review labs from 06/04/2018. 2. Multifocal RIGHT breast cancer  Clinically doing well.  No breast concerns.  Doing well on  daily endocrine therapy (exemestane).    No side effects.    Continue as previously prescribed.  Schedule mammogram for 08/30/2018. 3. Iron deficiency anemia  Labs reviewed.   No anemia or microcytosis.  Iron stores (ferritin) significantly low at 3 ng/mL.  Patient more symptomatic.  Check UA to assess for microscopic blood loss.  Encouraged follow-up with GI Vira Agar, MD) to discuss need for VCE to determine source of gastrointestinal bleeding.  Will proceed with Venofer 200 mg IV today, then weekly x3 (total for infusions). 4. Osteoporosis  Continues on calcium 1200 mg and vitamin D 800 IU daily.  On denosumab (Prolia) every 6 months (last 02/28/2018). 5. B12 deficiency  B12 level stable at _0 pg/mL when last checked.  Continue oral B12 1000 mcg supplementation daily. 6. RTC in 4 months for MD assessment, labs (CBC with diff, ferritin, CA27.29 - day before), and +/- Venofer.    Honor Loh, NP  06/05/18, 7:58 PM

## 2018-06-12 ENCOUNTER — Other Ambulatory Visit: Payer: Self-pay

## 2018-06-12 ENCOUNTER — Inpatient Hospital Stay: Payer: Medicare HMO

## 2018-06-12 VITALS — BP 153/84 | HR 85 | Temp 96.6°F | Resp 18

## 2018-06-12 DIAGNOSIS — C50411 Malignant neoplasm of upper-outer quadrant of right female breast: Secondary | ICD-10-CM | POA: Diagnosis not present

## 2018-06-12 DIAGNOSIS — M810 Age-related osteoporosis without current pathological fracture: Secondary | ICD-10-CM

## 2018-06-12 MED ORDER — IRON SUCROSE 20 MG/ML IV SOLN
200.0000 mg | Freq: Once | INTRAVENOUS | Status: AC
  Administered 2018-06-12: 200 mg via INTRAVENOUS

## 2018-06-12 MED ORDER — SODIUM CHLORIDE 0.9 % IV SOLN
Freq: Once | INTRAVENOUS | Status: AC
  Administered 2018-06-12: 14:00:00 via INTRAVENOUS
  Filled 2018-06-12: qty 250

## 2018-06-12 MED ORDER — SODIUM CHLORIDE 0.9 % IV SOLN: 200.0000 mg | Freq: Once | INTRAVENOUS | Status: DC

## 2018-06-12 NOTE — Patient Instructions (Signed)
Iron Sucrose injection What is this medicine? IRON SUCROSE (AHY ern SOO krohs) is an iron complex. Iron is used to make healthy red blood cells, which carry oxygen and nutrients throughout the body. This medicine is used to treat iron deficiency anemia in people with chronic kidney disease. This medicine may be used for other purposes; ask your health care provider or pharmacist if you have questions. COMMON BRAND NAME(S): Venofer What should I tell my health care provider before I take this medicine? They need to know if you have any of these conditions: -anemia not caused by low iron levels -heart disease -high levels of iron in the blood -kidney disease -liver disease -an unusual or allergic reaction to iron, other medicines, foods, dyes, or preservatives -pregnant or trying to get pregnant -breast-feeding How should I use this medicine? This medicine is for infusion into a vein. It is given by a health care professional in a hospital or clinic setting. Talk to your pediatrician regarding the use of this medicine in children. While this drug may be prescribed for children as young as 2 years for selected conditions, precautions do apply. Overdosage: If you think you have taken too much of this medicine contact a poison control center or emergency room at once. NOTE: This medicine is only for you. Do not share this medicine with others. What if I miss a dose? It is important not to miss your dose. Call your doctor or health care professional if you are unable to keep an appointment. What may interact with this medicine? Do not take this medicine with any of the following medications: -deferoxamine -dimercaprol -other iron products This medicine may also interact with the following medications: -chloramphenicol -deferasirox This list may not describe all possible interactions. Give your health care provider a list of all the medicines, herbs, non-prescription drugs, or dietary  supplements you use. Also tell them if you smoke, drink alcohol, or use illegal drugs. Some items may interact with your medicine. What should I watch for while using this medicine? Visit your doctor or healthcare professional regularly. Tell your doctor or healthcare professional if your symptoms do not start to get better or if they get worse. You may need blood work done while you are taking this medicine. You may need to follow a special diet. Talk to your doctor. Foods that contain iron include: whole grains/cereals, dried fruits, beans, or peas, leafy green vegetables, and organ meats (liver, kidney). What side effects may I notice from receiving this medicine? Side effects that you should report to your doctor or health care professional as soon as possible: -allergic reactions like skin rash, itching or hives, swelling of the face, lips, or tongue -breathing problems -changes in blood pressure -cough -fast, irregular heartbeat -feeling faint or lightheaded, falls -fever or chills -flushing, sweating, or hot feelings -joint or muscle aches/pains -seizures -swelling of the ankles or feet -unusually weak or tired Side effects that usually do not require medical attention (report to your doctor or health care professional if they continue or are bothersome): -diarrhea -feeling achy -headache -irritation at site where injected -nausea, vomiting -stomach upset -tiredness This list may not describe all possible side effects. Call your doctor for medical advice about side effects. You may report side effects to FDA at 1-800-FDA-1088. Where should I keep my medicine? This drug is given in a hospital or clinic and will not be stored at home. NOTE: This sheet is a summary. It may not cover all possible information. If   you have questions about this medicine, talk to your doctor, pharmacist, or health care provider.  2019 Elsevier/Gold Standard (2010-12-22 17:14:35)  

## 2018-06-18 ENCOUNTER — Other Ambulatory Visit: Payer: Self-pay

## 2018-06-19 ENCOUNTER — Inpatient Hospital Stay: Payer: Medicare HMO

## 2018-06-19 ENCOUNTER — Other Ambulatory Visit: Payer: Self-pay

## 2018-06-19 VITALS — BP 134/76 | HR 73 | Temp 97.0°F | Resp 18

## 2018-06-19 DIAGNOSIS — M810 Age-related osteoporosis without current pathological fracture: Secondary | ICD-10-CM

## 2018-06-19 DIAGNOSIS — C50411 Malignant neoplasm of upper-outer quadrant of right female breast: Secondary | ICD-10-CM | POA: Diagnosis not present

## 2018-06-19 MED ORDER — SODIUM CHLORIDE 0.9 % IV SOLN
Freq: Once | INTRAVENOUS | Status: AC
  Administered 2018-06-19: 14:00:00 via INTRAVENOUS
  Filled 2018-06-19: qty 250

## 2018-06-19 MED ORDER — SODIUM CHLORIDE 0.9 % IV SOLN: 200.0000 mg | Freq: Once | INTRAVENOUS | Status: DC

## 2018-06-19 MED ORDER — IRON SUCROSE 20 MG/ML IV SOLN
200.0000 mg | Freq: Once | INTRAVENOUS | Status: AC
  Administered 2018-06-19: 200 mg via INTRAVENOUS
  Filled 2018-06-19: qty 10

## 2018-06-26 ENCOUNTER — Inpatient Hospital Stay: Payer: Medicare HMO | Attending: Hematology and Oncology

## 2018-06-26 ENCOUNTER — Inpatient Hospital Stay: Payer: Medicare HMO

## 2018-06-26 ENCOUNTER — Other Ambulatory Visit: Payer: Self-pay

## 2018-06-26 VITALS — BP 119/68 | HR 79 | Temp 97.9°F | Resp 18

## 2018-06-26 DIAGNOSIS — Z79811 Long term (current) use of aromatase inhibitors: Secondary | ICD-10-CM | POA: Diagnosis not present

## 2018-06-26 DIAGNOSIS — Z79899 Other long term (current) drug therapy: Secondary | ICD-10-CM | POA: Insufficient documentation

## 2018-06-26 DIAGNOSIS — D509 Iron deficiency anemia, unspecified: Secondary | ICD-10-CM | POA: Insufficient documentation

## 2018-06-26 DIAGNOSIS — Z923 Personal history of irradiation: Secondary | ICD-10-CM | POA: Insufficient documentation

## 2018-06-26 DIAGNOSIS — E538 Deficiency of other specified B group vitamins: Secondary | ICD-10-CM | POA: Diagnosis not present

## 2018-06-26 DIAGNOSIS — M810 Age-related osteoporosis without current pathological fracture: Secondary | ICD-10-CM | POA: Insufficient documentation

## 2018-06-26 DIAGNOSIS — C50411 Malignant neoplasm of upper-outer quadrant of right female breast: Secondary | ICD-10-CM | POA: Insufficient documentation

## 2018-06-26 DIAGNOSIS — Z170 Estrogen receptor positive status [ER+]: Secondary | ICD-10-CM | POA: Diagnosis not present

## 2018-06-26 MED ORDER — SODIUM CHLORIDE 0.9 % IV SOLN
Freq: Once | INTRAVENOUS | Status: AC
  Administered 2018-06-26: 15:00:00 via INTRAVENOUS
  Filled 2018-06-26: qty 250

## 2018-06-26 MED ORDER — IRON SUCROSE 20 MG/ML IV SOLN
200.0000 mg | Freq: Once | INTRAVENOUS | Status: AC
  Administered 2018-06-26: 200 mg via INTRAVENOUS

## 2018-06-26 MED ORDER — SODIUM CHLORIDE 0.9 % IV SOLN: 200.0000 mg | Freq: Once | INTRAVENOUS | Status: DC

## 2018-06-26 NOTE — Patient Instructions (Signed)
Iron Sucrose injection What is this medicine? IRON SUCROSE (AHY ern SOO krohs) is an iron complex. Iron is used to make healthy red blood cells, which carry oxygen and nutrients throughout the body. This medicine is used to treat iron deficiency anemia in people with chronic kidney disease. This medicine may be used for other purposes; ask your health care provider or pharmacist if you have questions. COMMON BRAND NAME(S): Venofer What should I tell my health care provider before I take this medicine? They need to know if you have any of these conditions: -anemia not caused by low iron levels -heart disease -high levels of iron in the blood -kidney disease -liver disease -an unusual or allergic reaction to iron, other medicines, foods, dyes, or preservatives -pregnant or trying to get pregnant -breast-feeding How should I use this medicine? This medicine is for infusion into a vein. It is given by a health care professional in a hospital or clinic setting. Talk to your pediatrician regarding the use of this medicine in children. While this drug may be prescribed for children as young as 2 years for selected conditions, precautions do apply. Overdosage: If you think you have taken too much of this medicine contact a poison control center or emergency room at once. NOTE: This medicine is only for you. Do not share this medicine with others. What if I miss a dose? It is important not to miss your dose. Call your doctor or health care professional if you are unable to keep an appointment. What may interact with this medicine? Do not take this medicine with any of the following medications: -deferoxamine -dimercaprol -other iron products This medicine may also interact with the following medications: -chloramphenicol -deferasirox This list may not describe all possible interactions. Give your health care provider a list of all the medicines, herbs, non-prescription drugs, or dietary  supplements you use. Also tell them if you smoke, drink alcohol, or use illegal drugs. Some items may interact with your medicine. What should I watch for while using this medicine? Visit your doctor or healthcare professional regularly. Tell your doctor or healthcare professional if your symptoms do not start to get better or if they get worse. You may need blood work done while you are taking this medicine. You may need to follow a special diet. Talk to your doctor. Foods that contain iron include: whole grains/cereals, dried fruits, beans, or peas, leafy green vegetables, and organ meats (liver, kidney). What side effects may I notice from receiving this medicine? Side effects that you should report to your doctor or health care professional as soon as possible: -allergic reactions like skin rash, itching or hives, swelling of the face, lips, or tongue -breathing problems -changes in blood pressure -cough -fast, irregular heartbeat -feeling faint or lightheaded, falls -fever or chills -flushing, sweating, or hot feelings -joint or muscle aches/pains -seizures -swelling of the ankles or feet -unusually weak or tired Side effects that usually do not require medical attention (report to your doctor or health care professional if they continue or are bothersome): -diarrhea -feeling achy -headache -irritation at site where injected -nausea, vomiting -stomach upset -tiredness This list may not describe all possible side effects. Call your doctor for medical advice about side effects. You may report side effects to FDA at 1-800-FDA-1088. Where should I keep my medicine? This drug is given in a hospital or clinic and will not be stored at home. NOTE: This sheet is a summary. It may not cover all possible information. If   you have questions about this medicine, talk to your doctor, pharmacist, or health care provider.  2019 Elsevier/Gold Standard (2010-12-22 17:14:35)  

## 2018-06-27 ENCOUNTER — Inpatient Hospital Stay: Payer: Medicare HMO

## 2018-09-02 ENCOUNTER — Other Ambulatory Visit: Payer: Self-pay

## 2018-09-02 ENCOUNTER — Ambulatory Visit
Admission: RE | Admit: 2018-09-02 | Discharge: 2018-09-02 | Disposition: A | Discharge status: Home / Self Care | Payer: Medicare HMO | Source: Ambulatory Visit | Attending: Urgent Care | Admitting: Urgent Care

## 2018-09-02 DIAGNOSIS — Z170 Estrogen receptor positive status [ER+]: Secondary | ICD-10-CM | POA: Diagnosis present

## 2018-09-02 DIAGNOSIS — C50411 Malignant neoplasm of upper-outer quadrant of right female breast: Secondary | ICD-10-CM | POA: Diagnosis present

## 2018-10-01 ENCOUNTER — Inpatient Hospital Stay: Payer: Medicare HMO | Attending: Hematology and Oncology

## 2018-10-01 ENCOUNTER — Other Ambulatory Visit: Payer: Self-pay

## 2018-10-01 DIAGNOSIS — Z923 Personal history of irradiation: Secondary | ICD-10-CM | POA: Diagnosis not present

## 2018-10-01 DIAGNOSIS — D509 Iron deficiency anemia, unspecified: Secondary | ICD-10-CM | POA: Insufficient documentation

## 2018-10-01 DIAGNOSIS — E538 Deficiency of other specified B group vitamins: Secondary | ICD-10-CM | POA: Diagnosis not present

## 2018-10-01 DIAGNOSIS — Z79811 Long term (current) use of aromatase inhibitors: Secondary | ICD-10-CM | POA: Diagnosis not present

## 2018-10-01 DIAGNOSIS — C50411 Malignant neoplasm of upper-outer quadrant of right female breast: Secondary | ICD-10-CM | POA: Insufficient documentation

## 2018-10-01 DIAGNOSIS — Z170 Estrogen receptor positive status [ER+]: Secondary | ICD-10-CM | POA: Insufficient documentation

## 2018-10-01 LAB — CBC WITH DIFFERENTIAL/PLATELET
Abs Immature Granulocytes: 0.03 10*3/uL (ref 0.00–0.07)
Basophils Absolute: 0 10*3/uL (ref 0.0–0.1)
Basophils Relative: 1 %
Eosinophils Absolute: 0.1 10*3/uL (ref 0.0–0.5)
Eosinophils Relative: 3 %
HCT: 37.3 % (ref 36.0–46.0)
Hemoglobin: 12.7 g/dL (ref 12.0–15.0)
Immature Granulocytes: 1 %
Lymphocytes Relative: 31 %
Lymphs Abs: 1.6 10*3/uL (ref 0.7–4.0)
MCH: 33.9 pg (ref 26.0–34.0)
MCHC: 34 g/dL (ref 30.0–36.0)
MCV: 99.5 fL (ref 80.0–100.0)
Monocytes Absolute: 0.6 10*3/uL (ref 0.1–1.0)
Monocytes Relative: 12 %
Neutro Abs: 2.8 10*3/uL (ref 1.7–7.7)
Neutrophils Relative %: 52 %
Platelets: 204 10*3/uL (ref 150–400)
RBC: 3.75 MIL/uL — ABNORMAL LOW (ref 3.87–5.11)
RDW: 12.6 % (ref 11.5–15.5)
WBC: 5.1 10*3/uL (ref 4.0–10.5)
nRBC: 0 % (ref 0.0–0.2)

## 2018-10-01 LAB — FERRITIN: Ferritin: 205 ng/mL (ref 11–307)

## 2018-10-02 ENCOUNTER — Ambulatory Visit: Payer: Medicare HMO | Admitting: Hematology and Oncology

## 2018-10-02 ENCOUNTER — Ambulatory Visit: Payer: Medicare HMO

## 2018-10-02 LAB — CANCER ANTIGEN 27.29: CA 27.29: 13.8 U/mL (ref 0.0–38.6)

## 2018-10-02 NOTE — Progress Notes (Signed)
[facility]  [address] [place], [place] [address] Phone: [phone]  Fax: [phone]   Clinic Day:  10/04/2018  Referring physician: Clarisse Gouge, MD  Chief Complaint: Allison Hall is a 78 y.o. female with multi-focal right breast cancer, osteoporosis, iron deficiency anemia, and B12 deficiency who is seen for 4 month assessment.  HPI: The patient was last seen in the medical oncology clinic on 06/05/2018 by Honor Loh, NP. At that time, she noted increased fatigue.  She denied any shortness of breath, chest pain, or palpitations. She had continued pain in her knees (L>R).  She denied any bleeding.  No B symptoms.  She had several upper respiratory tract infections since her last visit.  Patient denied breast concerns.  Exam was grossly unchanged from previous.  Hemoglobin 12.7, hematocrit 37.6, ferritin 3, CA27.29 12.1. She continued on Aromasin.  She received Venofer weekly x4 (06/05/2018 - 06/26/2018).  She was seen in the [place] clinic on 07/25/2018 by Tammi Klippel, PA. Capsule study was discussed, but postponed secondary to COVID-19.  Bilateral diagnostic mammogram on 09/02/2018 revealed no evidence of malignancy in either breast, s/p right lumpectomy.   Labs on 10/01/2018:  WBC 5,100, hemoglobin 12.7, hematocrit 37.3, platelets 204,000. Ferritin 205. CA27.29 was 13.8.   During the interim, she is doing "good." She began taking slow release iron daily with orange juice a little over a month ago. She denies fatigue, but rests often to prevent worsening knee pain.   She denies any breast concerns. She does monthly breast exams. She continues on Aromasin. She denies any dental issues. She notes struggling with the heat this summer, especially at night. She continues on calcium and vitamin D. She continues on '500mg'$  B-12, switched from '1000mg'$  in March.   She has been seen in the pain clinic by Dr. Holley Raring and had a procedure  done on her knee, with improvement in her pain. It has improved with a cortisone shot. Her balance is fairly good, and she is careful.   Her bp is 105/60 today in the clinic but she denies any dizziness. She denies any change in medication.   She is agreeable to undergo a capsule study if her ferritin drops below 100, but does not want to do it now. She is agreeable to stop oral iron to see if her ferritin drops again.   Her husband is at [facility] because of his dementia and inability to walk due to severe neuropathy after a back surgery. She has not seen him since 06/2018 due to COVID-19.  She is struggling because he is not getting the care he needs and will not be coming home, as their house cannot accommodate his wheelchair.    Past Medical History:  Diagnosis Date  . Anxiety   . Breast cancer ([place]) 08/24/2016   right  . Cancer ([place])    skin ca  . Colon polyp   . Depression   . GERD (gastroesophageal reflux disease)   . Hypercholesteremia   . Hypertension   . Osteoarthritis   . Personal history of radiation therapy   . Thyroid nodule 04/17/2016    Past Surgical History:  Procedure Laterality Date  . ABDOMINAL HYSTERECTOMY    . APPENDECTOMY    . BREAST BIOPSY Right 08/24/2016   9:30 - invasive mammary carcinoma with lobular features  . BREAST BIOPSY Right 08/24/2016   10:00 - invasive mammary carcinoma with lobular features  . BREAST LUMPECTOMY Right 10/09/2016  .  COLONOSCOPY W/ POLYPECTOMY  2003, 2008, 2014  . COLONOSCOPY WITH PROPOFOL N/A 12/03/2017   Procedure: COLONOSCOPY WITH PROPOFOL;  Surgeon: Manya Silvas, MD;  Location: [facility] ENDOSCOPY;  Service: Endoscopy;  Laterality: N/A;  . ESOPHAGOGASTRODUODENOSCOPY     foreign body  . ESOPHAGOGASTRODUODENOSCOPY (EGD) WITH PROPOFOL N/A 12/03/2017   Procedure: ESOPHAGOGASTRODUODENOSCOPY (EGD) WITH PROPOFOL;  Surgeon: Manya Silvas, MD;  Location: [facility] ENDOSCOPY;  Service: Endoscopy;  Laterality: N/A;  . JOINT  REPLACEMENT Right 2008   TKR  . NOSE SURGERY    . PARTIAL MASTECTOMY WITH NEEDLE LOCALIZATION Right 09/29/2016   Procedure: PARTIAL MASTECTOMY WITH NEEDLE LOCALIZATION;  Surgeon: [name], MD;  Location: [facility] ORS;  Service: General;  Laterality: Right;  . REPLACEMENT TOTAL KNEE Right 2008  . SENTINEL NODE BIOPSY Right 09/29/2016   Procedure: SENTINEL NODE BIOPSY;  Surgeon: [name], MD;  Location: [facility] ORS;  Service: General;  Laterality: Right;    Family History  Problem Relation Age of Onset  . Hypertension Mother   . Depression Mother   . Heart failure Father   . Obesity Brother   . Heart disease Brother   . Stroke Brother   . Depression Brother   . Breast cancer Cousin   . Breast cancer Cousin   . Breast cancer Cousin     Social History:  reports that she quit smoking about 53 years ago. She has a 10.00 pack-year smoking history. She has never used smokeless tobacco. She reports current alcohol use of about 7.0 standard drinks of alcohol per week. She reports that she does not use drugs. She previously smoked 1/2 pack/day x 10 years.  She stopped smoking on 10/11/1978.  She drinks 6 glasses of wine/week.  She has 3 children (2 sons and 1 daughter).  She is a retired Clinical cytogeneticist for [employer].  She likes to do crossword puzzles.  Her daughter's name is [name].  Her husband is in an assisted living facility [facility]) secondary to dementia.  She lives in [place].  She is alone today.  Allergies:  Allergies  Allergen Reactions  . Sulfamethoxazole Other (See Comments)    Anxiety and imsomnia  . Statins Other (See Comments)    Muscle pain   . Trazodone Other (See Comments)    Pt states she felt "unsteady"   . Ciprofloxacin Anxiety  . Sulfa Antibiotics Anxiety    Current Medications: Current Outpatient Medications  Medication Sig Dispense Refill  . acetaminophen (TYLENOL) 500 MG tablet Take 650 mg by mouth every 6 (six) hours as needed.     Marland Kitchen amLODipine  (NORVASC) 10 MG tablet TAKE 1 TABLET BY MOUTH EVERY DAY    . buPROPion (WELLBUTRIN XL) 300 MG 24 hr tablet Take 300 mg by mouth daily.    . busPIRone (BUSPAR) 10 MG tablet Take 10 mg by mouth 3 (three) times daily.    . calcium carbonate 1250 MG capsule Take 1,250 mg by mouth daily.     . Cholecalciferol (VITAMIN D3) 1000 units CAPS Take 2,000 Units by mouth 2 (two) times daily.     Marland Kitchen denosumab (PROLIA) 60 MG/ML SOLN injection Inject 60 mg into the skin every 6 (six) months. Administer in upper arm, thigh, or abdomen    . exemestane (AROMASIN) 25 MG tablet Take 1 tablet (25 mg total) by mouth daily after breakfast. 90 tablet 3  . Ferrous Sulfate Dried ([employer] SLOW RELEASE IRON) 45 MG TBCR Take 1 tablet by mouth daily. With [facility]  juice    . loperamide (IMODIUM) 2 MG capsule Take 2 mg by mouth as needed for diarrhea or loose stools.     . Magnesium 250 MG TABS Take 500 mg by mouth daily.     Marland Kitchen omeprazole (PRILOSEC) 40 MG capsule Take 40 mg by mouth daily.     . rosuvastatin (CRESTOR) 5 MG tablet Take 5 mg by mouth daily.    . sertraline (ZOLOFT) 100 MG tablet Take 1.5 tablets (150 mg total) by mouth daily. (Patient taking differently: Take 100 mg by mouth daily. ) 45 tablet 1  . Turmeric Curcumin 500 MG CAPS Take 1,500 mg by mouth daily.     . vitamin B-12 (CYANOCOBALAMIN) 500 MCG tablet Take 500 mcg by mouth daily.     No current facility-administered medications for this visit.     Review of Systems  Constitutional: Positive for diaphoresis (hot flashes). Negative for fever and malaise/fatigue. Weight loss: Up 2lbs.       Doing "good."  HENT: Negative.  Negative for congestion, hearing loss, sinus pain and sore throat.   Eyes: Negative.  Negative for blurred vision, double vision and photophobia.  Respiratory: Negative.  Negative for cough, hemoptysis, sputum production and shortness of breath.   Cardiovascular: Negative.  Negative for chest pain, palpitations, orthopnea, leg swelling and PND.   Gastrointestinal: Negative for abdominal pain, blood in stool, constipation, diarrhea, melena, nausea and vomiting.       Rectal leakage at times.   Genitourinary: Negative.  Negative for dysuria, frequency, hematuria and urgency.  Musculoskeletal: Positive for joint pain (knees; some improvement with injections). Negative for back pain, falls and myalgias.  Skin: Negative for itching and rash.  Neurological: Negative for dizziness, tremors, sensory change, weakness and headaches.       Poor balance.  Endo/Heme/Allergies: Negative.  Does not bruise/bleed easily.  Psychiatric/Behavioral: Negative.  Negative for depression and memory loss. The patient is not nervous/anxious and does not have insomnia.   All other systems reviewed and are negative.  Performance status (ECOG):  1  Vitals Blood pressure 105/60, pulse 98, temperature 98 F (36.7 C), temperature source Tympanic, resp. rate 18, weight 209 lb 1.7 oz (94.8 kg), SpO2 98 %.   Physical Exam  Constitutional: She is oriented to person, place, and time. She appears well-developed and well-nourished. No distress.  HENT:  Head: Normocephalic and atraumatic.  Mouth/Throat: Oropharynx is clear and moist.  Short brown hair. Mask.  Eyes: Pupils are equal, round, and reactive to light. Conjunctivae and EOM are normal. No scleral icterus.  Glasses.  Blue eyes.  Neck: Normal range of motion. Neck supple. No JVD present.  Cardiovascular: Normal rate, regular rhythm and normal heart sounds.  No murmur heard. Pulmonary/Chest: Effort normal and breath sounds normal. No respiratory distress. She has no wheezes. She has no rales. Right breast exhibits tenderness. Right breast exhibits no inverted nipple, no mass, no nipple discharge and no skin change (post-operative and post-radiation changes). Left breast exhibits no inverted nipple, no mass, no nipple discharge, no skin change and no tenderness.  Abdominal: Soft. Bowel sounds are normal. She  exhibits no distension and no mass. There is no abdominal tenderness. There is no rebound and no guarding.  Musculoskeletal: Normal range of motion.        General: No edema.  Lymphadenopathy:    She has no cervical adenopathy.    She has no axillary adenopathy.       Right: No supraclavicular adenopathy present.  Left: No supraclavicular adenopathy present.  Neurological: She is alert and oriented to person, place, and time.  Skin: Skin is warm and dry. No rash noted. She is not diaphoretic. No erythema. No pallor.  Psychiatric: She has a normal mood and affect. Her behavior is normal. Judgment and thought content normal.  Nursing note and vitals reviewed.   No visits with results within 3 Day(s) from this visit.  Latest known visit with results is:  Appointment on 10/01/2018  Component Date Value Ref Range Status  . CA 27.29 10/01/2018 13.8  0.0 - 38.6 U/mL Final   Comment: (NOTE) Siemens Centaur Immunochemiluminometric Methodology [facility]) Values obtained with different assay methods or kits cannot be used interchangeably. Results cannot be interpreted as absolute evidence of the presence or absence of malignant disease. Performed At: [facility] [place], [place] [id number] Rush Farmer MD VP:[id number]   . WBC 10/01/2018 5.1  4.0 - 10.5 K/uL Final  . RBC 10/01/2018 3.75* 3.87 - 5.11 MIL/uL Final  . Hemoglobin 10/01/2018 12.7  12.0 - 15.0 g/dL Final  . HCT 10/01/2018 37.3  36.0 - 46.0 % Final  . MCV 10/01/2018 99.5  80.0 - 100.0 fL Final  . MCH 10/01/2018 33.9  26.0 - 34.0 pg Final  . MCHC 10/01/2018 34.0  30.0 - 36.0 g/dL Final  . RDW 10/01/2018 12.6  11.5 - 15.5 % Final  . Platelets 10/01/2018 204  150 - 400 K/uL Final  . nRBC 10/01/2018 0.0  0.0 - 0.2 % Final  . Neutrophils Relative % 10/01/2018 52  % Final  . Neutro Abs 10/01/2018 2.8  1.7 - 7.7 K/uL Final  . Lymphocytes Relative 10/01/2018 31  % Final  . Lymphs Abs 10/01/2018 1.6  0.7 -  4.0 K/uL Final  . Monocytes Relative 10/01/2018 12  % Final  . Monocytes Absolute 10/01/2018 0.6  0.1 - 1.0 K/uL Final  . Eosinophils Relative 10/01/2018 3  % Final  . Eosinophils Absolute 10/01/2018 0.1  0.0 - 0.5 K/uL Final  . Basophils Relative 10/01/2018 1  % Final  . Basophils Absolute 10/01/2018 0.0  0.0 - 0.1 K/uL Final  . Immature Granulocytes 10/01/2018 1  % Final  . Abs Immature Granulocytes 10/01/2018 0.03  0.00 - 0.07 K/uL Final   Performed at [facility], [address], [place], [place] [address]  . Ferritin 10/01/2018 205  11 - 307 ng/mL Final   Performed at [facility], [address] [place], [place] [address]    Assessment:  Allison Hall is a 78 y.o. female with multi-focal right breast cancer s/p partial mastectomy with sentinel lymph node biopsy on 09/29/2016.  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  Three sentinel lymph nodes were negative.  Tumor was ER + (> 90%), PR + (> 90%) and Her2/neu 2+ (negative by FISH).  Pathologic stage was T1c(m) N0(sn).  CA27.29 was 13.4 on 09/06/2016.  MammaPrint revealed a 29% risk of recurrence (high).  She was seen for second opinion at [facility] on 10/25/2016.  Chemotherapy was not recommended.  Bilateral screening mammogram on 07/24/2016 revealed a possible area of distortion in the right breast. Diagnostic mammogram on 08/10/2016 revealed 2 suspicious masses in the upper-outer quadrant of the right breast in a broad area of confluent distortion in the upper-outer quadrant of the right breast.  There are at least 3 areas of confluent distortion spanning  approximately 6.5 cm of tissue.  Ultrasound on 07/31/2016 revealed  an irregular hypoechoic 1.2 x 1.1 x 1.2 cm at the 9:30 position 7 cm from the nipple.  In addition, there was an irregular 1.9 x 0.8 x 1.2 cm mass at the 10 o'clock position 5 cm from the  nipple.  There were multiple small normal-appearing lymph nodes.   Breast MRI on 09/16/2016 revealed an irregular 1.6 x 1.2 x 1.6 cm enhancing mass adjacent to the clip artifact within the upper, outer right breast, middle depth, corresponding to the mass seen at 10 o'clock. There was an additional irregular enhancing mass adjacent to the more posterior clip artifact measuring 2.6 x 1.4 x 1.6 cm, corresponding  to the mass seen at 9:30 position. Together, these 2 masses span 6.3 cm of breast tissue from anterior to posterior. There was no definite additional suspicious area of enhancement within the right breast to correspond to the third area of distortion identified on diagnostic mammogram dated 08/10/2016.  Left breast revealed no suspicious mass or enhancement.  Bilateral diagnostic mammogram on 09/02/2018 revealed no evidence of malignancy in either breast, s/p right lumpectomy.   She received radiation from 11/15/2016 - 01/08/2017.  She was Femara (01/17/2017 - 05/16/2017) secondary to arthralgias.  She discontinued Arimidex after 19 days (last 06/14/2017) secondary to fatigue and polyarthralgias.  She began Aromasin on 06/27/2017.  CA27.29 has been followed:  13.4 on 09/06/2016, 13.0 on 05/16/2017, 17.1 on 08/29/2017, 12.1 on 06/04/2018, and 13.8 on 10/01/2018.  Bone density studyon 09/30/2015 revealed osteopeniawith a T-score of -2.1 in the right femoral neck.  Bone density study revealed osteoporosis on 10/02/2017 with a T-score of -3.1. She began Prolia on 02/14/2017 (last 02/28/2018).  She was diagnosed with iron deficiency anemia and B12 deficiency on 08/29/2017.  TSH and folate were normal.  She is on oral B12 500 mcg a day.  Intrinsic factor and anti-parietal cell antibodies were normal on 01/02/2018.  B12 was 888 on 01/29/2018.  She received Venofer weekly x 3 (10/31/2017 - 11/13/2017) and x 4 (06/05/2018 - 06/26/2018).  Ferritin has been followed: 10 on 08/29/2017, 174 on  12/12/2017, 74 on 01/02/2018, 64 on 01/29/2018, 3 on 06/04/2018, and 205 on 10/01/2018.  EGD and colonoscopy on 12/03/2017. Polyps were noted. Pathology was negative for dysplasia and malignancy.   Bilateral diagnostic mammogram on 09/02/2018 revealed no evidence of malignancy in either breast, s/p right lumpectomy.   Symptomatically, she is doing well.  Exam is unremarkable.  Creatinine is 1.62 (baseline 1.08 - 1.21).  Plan: 1.   Review labs from 10/01/2018. 2.   Multifocal RIGHT breast cancer  Clinically doing well without evidence of recurrent disease.  Review interval mammogram- no evidence of disease.  Continue Aromasin. 3.   Iron deficiency anemia  Hematocrit 37.3.  Hemoglobin 12.7.  MCV 99.5.  Ferritin 205.  Discuss discontinuation of oral iron.  Patient declines capsule study.  Discuss plan to readdress capsule study if ferritin drops to < 100.  No Venofer needed today. 4.   Osteoporosis Continue calcium 1200 mg and vitamin D 800 IU daily. Patient receives Prolia every 6 months (last 02/28/2018). No dental concerns. Postpone Prolia today secondary to increased creatinine  5.   B12 deficiency B12 was  888 on 01/29/2018. Patient decreased B12 from 1000 mcg/day to 500 mcg/day. Plan follow-up B12 level. 6.   Renal insufficiency  Patient to follow-up with Dr Vickki Muff.  Anticipate rescheduling Prolia with improvement in kidney function. 7.   RTC in  4 months for MD assessment, labs (CBC with diff, CMP, ferritin, B12, CA27.29- day before), and and +/- Venofer.   I discussed the assessment and treatment plan with the patient.  The patient was provided an opportunity to ask questions and all were answered.  The patient agreed with the plan and demonstrated an understanding of the instructions.  The patient was advised to call back if the symptoms worsen or if the condition fails to improve as anticipated.  I provided 20 minutes of face-to-face time during this this encounter and >  50% was spent counseling as documented under my assessment and plan.    Lequita Asal, MD, PhD    10/04/2018, 2:23 PM  I, Molly Dorshimer, am acting as Education administrator for [employer]. Mike Gip, MD, PhD.  I, Melissa C. Mike Gip, MD, have reviewed the above documentation for accuracy and completeness, and I agree with the above.

## 2018-10-03 ENCOUNTER — Other Ambulatory Visit: Payer: Self-pay

## 2018-10-04 ENCOUNTER — Telehealth: Payer: Self-pay

## 2018-10-04 ENCOUNTER — Encounter: Payer: Self-pay | Admitting: Hematology and Oncology

## 2018-10-04 ENCOUNTER — Inpatient Hospital Stay: Payer: Medicare HMO

## 2018-10-04 ENCOUNTER — Inpatient Hospital Stay (HOSPITAL_BASED_OUTPATIENT_CLINIC_OR_DEPARTMENT_OTHER): Payer: Medicare HMO | Admitting: Hematology and Oncology

## 2018-10-04 VITALS — BP 105/60 | HR 98 | Temp 98.0°F | Resp 18 | Wt 209.1 lb

## 2018-10-04 DIAGNOSIS — Z923 Personal history of irradiation: Secondary | ICD-10-CM

## 2018-10-04 DIAGNOSIS — Z170 Estrogen receptor positive status [ER+]: Secondary | ICD-10-CM

## 2018-10-04 DIAGNOSIS — C50411 Malignant neoplasm of upper-outer quadrant of right female breast: Secondary | ICD-10-CM | POA: Diagnosis not present

## 2018-10-04 DIAGNOSIS — D509 Iron deficiency anemia, unspecified: Secondary | ICD-10-CM | POA: Diagnosis not present

## 2018-10-04 DIAGNOSIS — M810 Age-related osteoporosis without current pathological fracture: Secondary | ICD-10-CM

## 2018-10-04 DIAGNOSIS — E538 Deficiency of other specified B group vitamins: Secondary | ICD-10-CM | POA: Diagnosis not present

## 2018-10-04 DIAGNOSIS — Z79811 Long term (current) use of aromatase inhibitors: Secondary | ICD-10-CM

## 2018-10-04 LAB — COMPREHENSIVE METABOLIC PANEL
ALT: 23 U/L (ref 0–44)
AST: 27 U/L (ref 15–41)
Albumin: 5.1 g/dL — ABNORMAL HIGH (ref 3.5–5.0)
Alkaline Phosphatase: 47 U/L (ref 38–126)
Anion gap: 9 (ref 5–15)
BUN: 29 mg/dL — ABNORMAL HIGH (ref 8–23)
CO2: 26 mmol/L (ref 22–32)
Calcium: 9.9 mg/dL (ref 8.9–10.3)
Chloride: 103 mmol/L (ref 98–111)
Creatinine, Ser: 1.62 mg/dL — ABNORMAL HIGH (ref 0.44–1.00)
GFR calc Af Amer: 35 mL/min — ABNORMAL LOW (ref >60)
GFR calc non Af Amer: 30 mL/min — ABNORMAL LOW (ref >60)
Glucose, Bld: 103 mg/dL — ABNORMAL HIGH (ref 70–99)
Potassium: 3.6 mmol/L (ref 3.5–5.1)
Sodium: 138 mmol/L (ref 135–145)
Total Bilirubin: 0.7 mg/dL (ref 0.3–1.2)
Total Protein: 7.8 g/dL (ref 6.5–8.1)

## 2018-10-04 MED ORDER — DENOSUMAB 60 MG/ML ~~LOC~~ SOLN: 60.0000 mg | Freq: Once | SUBCUTANEOUS | Status: DC

## 2018-10-04 MED ORDER — DENOSUMAB 60 MG/ML ~~LOC~~ SOSY: 60.0000 mg | PREFILLED_SYRINGE | Freq: Once | SUBCUTANEOUS | Status: DC

## 2018-10-04 NOTE — Patient Instructions (Signed)
Patient to call PCP and discuss elevated kidney levels of creatinine and BUN.

## 2018-10-04 NOTE — Progress Notes (Signed)
Pt here for follow up. Denies any concerns at this time.  

## 2018-10-04 NOTE — Telephone Encounter (Signed)
Informed patient of elevated creatinine level. Patient states she will reach out to her PCP. Labs forwarded to Dr. Vickki Muff to review.

## 2018-10-07 ENCOUNTER — Other Ambulatory Visit: Payer: Self-pay | Admitting: *Deleted

## 2018-10-07 MED ORDER — EXEMESTANE 25 MG PO TABS: 25.0000 mg | ORAL_TABLET | Freq: Every day | ORAL | 3 refills | Status: DC

## 2018-10-08 ENCOUNTER — Telehealth: Payer: Self-pay

## 2018-10-08 NOTE — Telephone Encounter (Signed)
-----   Message from Lequita Asal, MD sent at 10/05/2018  6:17 PM EDT ----- Regarding: Let's call her next week to discuss future Prolia (after renal function addressed)

## 2018-10-08 NOTE — Telephone Encounter (Signed)
Contacted patient to inquire about elevated creatinine levels and what her PCP suggestions were. Patient reports she is scheduled to have additional lab work on Wednesday. Advised patient to contact our office to update [place] so that we can get her rescheduled for her Prolia injection once her creatinine levels have been addressed. Patient verbalizes understanding and denies any further questions or concerns.

## 2018-10-11 ENCOUNTER — Other Ambulatory Visit: Payer: Self-pay

## 2018-10-11 ENCOUNTER — Inpatient Hospital Stay: Payer: Medicare HMO

## 2018-10-11 VITALS — BP 144/85 | HR 93 | Temp 97.7°F | Resp 18

## 2018-10-11 DIAGNOSIS — C50411 Malignant neoplasm of upper-outer quadrant of right female breast: Secondary | ICD-10-CM | POA: Diagnosis not present

## 2018-10-11 DIAGNOSIS — M810 Age-related osteoporosis without current pathological fracture: Secondary | ICD-10-CM

## 2018-10-11 MED ORDER — DENOSUMAB 60 MG/ML ~~LOC~~ SOSY
60.0000 mg | PREFILLED_SYRINGE | Freq: Once | SUBCUTANEOUS | Status: AC
  Administered 2018-10-11: 10:00:00 60 mg via SUBCUTANEOUS

## 2018-10-11 NOTE — Progress Notes (Signed)
Using lab results from 7/10. MD cleared her for injection today.

## 2019-01-16 ENCOUNTER — Encounter: Payer: Self-pay | Admitting: Radiation Oncology

## 2019-01-16 ENCOUNTER — Ambulatory Visit
Admission: RE | Admit: 2019-01-16 | Discharge: 2019-01-16 | Disposition: A | Discharge status: Home / Self Care | Payer: Medicare HMO | Source: Ambulatory Visit | Attending: Radiation Oncology | Admitting: Radiation Oncology

## 2019-01-16 ENCOUNTER — Other Ambulatory Visit: Payer: Self-pay

## 2019-01-16 VITALS — BP 133/64 | HR 92 | Temp 97.8°F | Resp 18 | Wt 201.9 lb

## 2019-01-16 DIAGNOSIS — C50411 Malignant neoplasm of upper-outer quadrant of right female breast: Secondary | ICD-10-CM | POA: Diagnosis not present

## 2019-01-16 DIAGNOSIS — Z170 Estrogen receptor positive status [ER+]: Secondary | ICD-10-CM | POA: Insufficient documentation

## 2019-01-16 DIAGNOSIS — Z79811 Long term (current) use of aromatase inhibitors: Secondary | ICD-10-CM | POA: Insufficient documentation

## 2019-01-16 DIAGNOSIS — Z923 Personal history of irradiation: Secondary | ICD-10-CM | POA: Diagnosis not present

## 2019-01-16 NOTE — Progress Notes (Signed)
Radiation Oncology Follow up Note  Name: Allison Hall   Date:   01/16/2019 MRN:  [id number] DOB: 1940-11-05    This 78 y.o. female presents to the clinic today for 2-year follow-up status post whole breast radiation to her right breast for stage I ER/PR positive invasive mammary carcinoma.  REFERRING PROVIDER: Clarisse Gouge, MD  HPI: Patient is a 78 year old female now out 2 years having completed whole breast radiation to her right breast for stage I ER/PR positive invasive mammary carcinoma seen today in routine follow-up she is doing well specifically denies breast tenderness cough or bone pain..  She had a mammogram back in June which I have reviewed was BI-RADS 2 benign.  She is currently on Aromasin tolerating that well without side effect  COMPLICATIONS OF TREATMENT: none  FOLLOW UP COMPLIANCE: keeps appointments   PHYSICAL EXAM:  BP 133/64 (BP Location: Left Arm, Patient Position: Sitting)   Pulse 92   Temp 97.8 F (36.6 C) (Tympanic)   Resp 18   Wt 201 lb 14.4 oz (91.6 kg)   BMI 33.60 kg/m  Lungs are clear to A&P cardiac examination essentially unremarkable with regular rate and rhythm. No dominant mass or nodularity is noted in either breast in 2 positions examined. Incision is well-healed. No axillary or supraclavicular adenopathy is appreciated. Cosmetic result is excellent.  Well-developed well-nourished patient in NAD. HEENT reveals PERLA, EOMI, discs not visualized.  Oral cavity is clear. No oral mucosal lesions are identified. Neck is clear without evidence of cervical or supraclavicular adenopathy. Lungs are clear to A&P. Cardiac examination is essentially unremarkable with regular rate and rhythm without murmur rub or thrill. Abdomen is benign with no organomegaly or masses noted. Motor sensory and DTR levels are equal and symmetric in the upper and lower extremities. Cranial nerves II through XII are grossly intact. Proprioception is intact. No peripheral  adenopathy or edema is identified. No motor or sensory levels are noted. Crude visual fields are within normal range.  RADIOLOGY RESULTS: Recent mammograms reviewed compatible with above-stated findings  PLAN: Present time patient continues to do well with no evidence of disease.  I am pleased with her overall progress.  She continues on Aromasin without side effect.  I have asked to see her back in 1 year for follow-up.  She continues close follow-up care with medical oncology.  I would like to take this opportunity to thank you for allowing me to participate in the care of your patient.Noreene Filbert, MD

## 2019-02-03 NOTE — Progress Notes (Signed)
[facility]  [address] [place], [place] [address] Phone: [phone]  Fax: [phone]   Clinic Day:  02/05/2019  Referring physician: Clarisse Gouge, MD  Chief Complaint: Allison Hall is a 78 y.o. female with multi-focal right breast cancer, osteoporosis, iron deficiency anemia, and B12 deficiency who is seen for 4 month assessment.  HPI: The patient was last seen in the medical oncology clinic on 10/04/2018. At that time, she was doing well. Exam was unremarkable. Creatinine was 1.62 (baseline 1.08 - 1.21). Hematocrit 37.3, hemoglobin 12.7, MCV 99.5. Ferritin was 205. Patient did not receive any Venofer. Patient continued calcium 1200 mg and vitamin D 800 IU daily.   Patient received Prolia on 10/11/2018.   Labs on 02/04/2019: Hematocrit 37.9, hemoglobin 12.6, MCV 99.0, platelets 219,000, WBC 7,000.  LFTs were normal.  Ferritin 162. Vitamin B12 was 544. CA27.29 was 15.6.   During the interim, she has done well. She feels less stressed and has a good energy level. She notes knee pain. She has poor balance. She has normal bowels with mild rectal leakage. Patient notes she may need cataract surgery in 01/2019 or 02/2019.     Past Medical History:  Diagnosis Date  . Anxiety   . Breast cancer ([place]) 08/24/2016   right  . Cancer ([place])    skin ca  . Colon polyp   . Depression   . GERD (gastroesophageal reflux disease)   . Hypercholesteremia   . Hypertension   . Osteoarthritis   . Personal history of radiation therapy   . Thyroid nodule 04/17/2016    Past Surgical History:  Procedure Laterality Date  . ABDOMINAL HYSTERECTOMY    . APPENDECTOMY    . BREAST BIOPSY Right 08/24/2016   9:30 - invasive mammary carcinoma with lobular features  . BREAST BIOPSY Right 08/24/2016   10:00 - invasive mammary carcinoma with lobular features  . BREAST LUMPECTOMY Right 10/09/2016  . COLONOSCOPY W/ POLYPECTOMY  2003, 2008, 2014  .  COLONOSCOPY WITH PROPOFOL N/A 12/03/2017   Procedure: COLONOSCOPY WITH PROPOFOL;  Surgeon: Manya Silvas, MD;  Location: [facility] ENDOSCOPY;  Service: Endoscopy;  Laterality: N/A;  . ESOPHAGOGASTRODUODENOSCOPY     foreign body  . ESOPHAGOGASTRODUODENOSCOPY (EGD) WITH PROPOFOL N/A 12/03/2017   Procedure: ESOPHAGOGASTRODUODENOSCOPY (EGD) WITH PROPOFOL;  Surgeon: Manya Silvas, MD;  Location: [facility] ENDOSCOPY;  Service: Endoscopy;  Laterality: N/A;  . JOINT REPLACEMENT Right 2008   TKR  . NOSE SURGERY    . PARTIAL MASTECTOMY WITH NEEDLE LOCALIZATION Right 09/29/2016   Procedure: PARTIAL MASTECTOMY WITH NEEDLE LOCALIZATION;  Surgeon: [name], MD;  Location: [facility] ORS;  Service: General;  Laterality: Right;  . REPLACEMENT TOTAL KNEE Right 2008  . SENTINEL NODE BIOPSY Right 09/29/2016   Procedure: SENTINEL NODE BIOPSY;  Surgeon: [name], MD;  Location: [facility] ORS;  Service: General;  Laterality: Right;    Family History  Problem Relation Age of Onset  . Hypertension Mother   . Depression Mother   . Heart failure Father   . Obesity Brother   . Heart disease Brother   . Stroke Brother   . Depression Brother   . Breast cancer Cousin   . Breast cancer Cousin   . Breast cancer Cousin     Social History:  reports that she quit smoking about 53 years ago. She has a 10.00 pack-year smoking history. She has never used smokeless tobacco. She reports current alcohol use of about 7.0 standard drinks  of alcohol per week. She reports that she does not use drugs. She previously smoked 1/2 pack/day x 10 years. She stopped smoking on 10/11/1978. She drinks 6 glasses of wine/week. She has 3 children (2 sons and 1 daughter). She is a retired Clinical cytogeneticist for [employer]. She likes to do crossword puzzles. Her daughter's name is [name]. Her husband is in an assisted living facility [facility]) secondary to dementia. She lives in [place]. The patient is alone today.  Allergies:   Allergies  Allergen Reactions  . Sulfamethoxazole Other (See Comments)    Anxiety and imsomnia  . Other Itching    Vit C-vit E-copper-zinc-lutein - itching and fatigue (per [facility])  . Statins Other (See Comments)    Muscle pain   . Trazodone Other (See Comments)    Pt states she felt "unsteady"   . Ciprofloxacin Anxiety  . Sulfa Antibiotics Anxiety    Current Medications: Current Outpatient Medications  Medication Sig Dispense Refill  . acetaminophen (TYLENOL) 500 MG tablet Take 650 mg by mouth every 6 (six) hours as needed.     Marland Kitchen amLODipine (NORVASC) 10 MG tablet TAKE 1 TABLET BY MOUTH EVERY DAY    . buPROPion (WELLBUTRIN XL) 300 MG 24 hr tablet Take by mouth.    . busPIRone (BUSPAR) 10 MG tablet Take 10 mg by mouth 3 (three) times daily.    . calcium carbonate 1250 MG capsule Take 1,250 mg by mouth daily.     . Cholecalciferol (VITAMIN D3) 1000 units CAPS Take 2,000 Units by mouth 2 (two) times daily.     Marland Kitchen denosumab (PROLIA) 60 MG/ML SOLN injection Inject 60 mg into the skin every 6 (six) months. Administer in upper arm, thigh, or abdomen    . exemestane (AROMASIN) 25 MG tablet Take 1 tablet (25 mg total) by mouth daily after breakfast. 90 tablet 3  . Ferrous Sulfate Dried ([employer] SLOW RELEASE IRON) 45 MG TBCR Take 1 tablet by mouth daily. With Orange juice    . lisinopril (ZESTRIL) 40 MG tablet Take 40 mg by mouth daily.    Marland Kitchen loperamide (IMODIUM) 2 MG capsule Take 2 mg by mouth as needed for diarrhea or loose stools.     . Magnesium 250 MG TABS Take 500 mg by mouth daily.     . Multiple Vitamins-Minerals (PRESERVISION AREDS PO) Take 1 capsule by mouth 2 (two) times daily.    . Turmeric Curcumin 500 MG CAPS Take 1,500 mg by mouth daily.     . vitamin B-12 (CYANOCOBALAMIN) 500 MCG tablet Take 500 mcg by mouth daily.    Marland Kitchen omeprazole (PRILOSEC) 40 MG capsule Take 40 mg by mouth daily.     . rosuvastatin (CRESTOR) 5 MG tablet Take 5 mg by mouth daily.    . sertraline (ZOLOFT) 100 MG  tablet Take 1.5 tablets (150 mg total) by mouth daily. (Patient taking differently: Take 100 mg by mouth daily. ) 45 tablet 1   No current facility-administered medications for this visit.     Review of Systems  Constitutional: Positive for weight loss (1 pound). Negative for chills, diaphoresis, fever and malaise/fatigue.       Doing well. Good energy level.  HENT: Negative.  Negative for congestion, ear pain, hearing loss, nosebleeds, sinus pain and sore throat.   Eyes: Negative.  Negative for blurred vision, double vision and photophobia.  Respiratory: Negative.  Negative for cough, hemoptysis, sputum production and shortness of breath.   Cardiovascular: Negative.  Negative for chest  pain, palpitations, orthopnea, leg swelling and PND.  Gastrointestinal: Negative for abdominal pain, blood in stool, constipation, diarrhea, melena, nausea and vomiting.       Normal bowels with mild leakage.  Genitourinary: Negative.  Negative for dysuria, frequency, hematuria and urgency.  Musculoskeletal: Positive for joint pain (knees; some improvement with injections). Negative for back pain, falls, myalgias and neck pain.  Skin: Negative.  Negative for itching and rash.  Neurological: Negative for dizziness, tremors, sensory change, speech change, focal weakness, weakness and headaches.       Poor balance.  Endo/Heme/Allergies: Negative.  Does not bruise/bleed easily.  Psychiatric/Behavioral: Negative.  Negative for depression and memory loss. The patient is not nervous/anxious and does not have insomnia.   All other systems reviewed and are negative.  Performance status (ECOG): 1  Vitals Blood pressure 131/67, pulse 84, temperature (!) 97.5 F (36.4 C), temperature source Tympanic, resp. rate 16, weight 208 lb 1.8 oz (94.4 kg), SpO2 98 %.  Physical Exam  Constitutional: She is oriented to person, place, and time. She appears well-developed and well-nourished. No distress.  HENT:  Head:  Normocephalic and atraumatic.  Mouth/Throat: Oropharynx is clear and moist.  Short brown hair. Mask.  Eyes: Pupils are equal, round, and reactive to light. Conjunctivae and EOM are normal. No scleral icterus.  Glasses.  Blue eyes.  Neck: Normal range of motion. Neck supple. No JVD present.  Cardiovascular: Normal rate, regular rhythm and normal heart sounds.  No murmur heard. Pulmonary/Chest: Effort normal and breath sounds normal. No respiratory distress. She has no wheezes. She has no rales. She exhibits edema (right breast; inferior ). Right breast exhibits no inverted nipple, no mass, no nipple discharge, no skin change and no tenderness. Left breast exhibits no inverted nipple, no mass, no nipple discharge, no skin change and no tenderness. Breasts are symmetrical.  Abdominal: Soft. Bowel sounds are normal. She exhibits no distension and no mass. There is no abdominal tenderness. There is no rebound and no guarding.  Musculoskeletal: Normal range of motion.        General: No edema.  Lymphadenopathy:       Head (right side): No preauricular, no posterior auricular and no occipital adenopathy present.       Head (left side): No preauricular, no posterior auricular and no occipital adenopathy present.    She has no cervical adenopathy.    She has no axillary adenopathy.       Right: No inguinal and no supraclavicular adenopathy present.       Left: No inguinal and no supraclavicular adenopathy present.  Neurological: She is alert and oriented to person, place, and time.  Skin: Skin is warm and dry. She is not diaphoretic. No erythema. No pallor.  Psychiatric: She has a normal mood and affect. Her behavior is normal. Judgment and thought content normal.  Nursing note and vitals reviewed.   Orders Only on 02/05/2019  Component Date Value Ref Range Status  . Sodium 02/04/2019 140  135 - 145 mmol/L Final  . Potassium 02/04/2019 4.1  3.5 - 5.1 mmol/L Final  . Chloride 02/04/2019 105  98 -  111 mmol/L Final  . CO2 02/04/2019 22  22 - 32 mmol/L Final  . Glucose, Bld 02/04/2019 77  70 - 99 mg/dL Final  . BUN 02/04/2019 19  8 - 23 mg/dL Final  . Creatinine, Ser 02/04/2019 1.36* 0.44 - 1.00 mg/dL Final  . Calcium 02/04/2019 9.5  8.9 - 10.3 mg/dL Final  . GFR  calc non Af Amer 02/04/2019 37* >60 mL/min Final  . GFR calc Af Amer 02/04/2019 43* >60 mL/min Final  . Anion gap 02/04/2019 13  5 - 15 Final   Performed at [facility], [address], [place], [place] [address]  Appointment on 02/04/2019  Component Date Value Ref Range Status  . Vitamin B-12 02/04/2019 544  180 - 914 pg/mL Final   Comment: (NOTE) This assay is not validated for testing neonatal or myeloproliferative syndrome specimens for Vitamin B12 levels. Performed at [facility], [place] [address] [place], [place] [address]   . Total Protein 02/04/2019 7.4  6.5 - 8.1 g/dL Final  . Albumin 02/04/2019 4.9  3.5 - 5.0 g/dL Final  . AST 02/04/2019 23  15 - 41 U/L Final  . ALT 02/04/2019 19  0 - 44 U/L Final  . Alkaline Phosphatase 02/04/2019 42  38 - 126 U/L Final  . Total Bilirubin 02/04/2019 0.5  0.3 - 1.2 mg/dL Final  . Bilirubin, Direct 02/04/2019 <0.1  0.0 - 0.2 mg/dL Final  . Indirect Bilirubin 02/04/2019 NOT CALCULATED  0.3 - 0.9 mg/dL Final   Performed at [facility], [address], [place], [place] [address]  . Ferritin 02/04/2019 162  11 - 307 ng/mL Final   Performed at [facility], [address] [place], [place] [address]  . CA 27.29 02/04/2019 15.6  0.0 - 38.6 U/mL Final   Comment: (NOTE) Siemens Centaur Immunochemiluminometric Methodology [facility]) Values obtained with different assay methods or kits cannot be used interchangeably. Results cannot be interpreted as absolute evidence of the presence or absence of malignant disease. Performed At: [facility] [place], [place] [id number] Rush Farmer MD [id number]   . WBC 02/04/2019  7.0  4.0 - 10.5 K/uL Final  . RBC 02/04/2019 3.83* 3.87 - 5.11 MIL/uL Final  . Hemoglobin 02/04/2019 12.6  12.0 - 15.0 g/dL Final  . HCT 02/04/2019 37.9  36.0 - 46.0 % Final  . MCV 02/04/2019 99.0  80.0 - 100.0 fL Final  . MCH 02/04/2019 32.9  26.0 - 34.0 pg Final  . MCHC 02/04/2019 33.2  30.0 - 36.0 g/dL Final  . RDW 02/04/2019 13.3  11.5 - 15.5 % Final  . Platelets 02/04/2019 219  150 - 400 K/uL Final  . nRBC 02/04/2019 0.0  0.0 - 0.2 % Final  . Neutrophils Relative % 02/04/2019 58  % Final  . Neutro Abs 02/04/2019 4.1  1.7 - 7.7 K/uL Final  . Lymphocytes Relative 02/04/2019 27  % Final  . Lymphs Abs 02/04/2019 1.9  0.7 - 4.0 K/uL Final  . Monocytes Relative 02/04/2019 11  % Final  . Monocytes Absolute 02/04/2019 0.8  0.1 - 1.0 K/uL Final  . Eosinophils Relative 02/04/2019 2  % Final  . Eosinophils Absolute 02/04/2019 0.1  0.0 - 0.5 K/uL Final  . Basophils Relative 02/04/2019 1  % Final  . Basophils Absolute 02/04/2019 0.1  0.0 - 0.1 K/uL Final  . Immature Granulocytes 02/04/2019 1  % Final  . Abs Immature Granulocytes 02/04/2019 0.04  0.00 - 0.07 K/uL Final   Performed at [facility], [address], [place],  [address]    Assessment:  Allison Hall is a 78 y.o. female withmulti-focalright breast cancers/p partial mastectomy with sentinel lymph node biopsy on 09/29/2016. 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 Three  sentinel lymph nodes were negative. Tumor was ER + (>90%), PR + (>90%) and Her2/neu 2+ (negative by FISH). Pathologic stage was T1c(m) N0(sn). CA27.29was 13.4 on 09/06/2016.  MammaPrintrevealed a 29% risk of recurrence (high). She was seen for second opinion at [facility] on 10/25/2016. Chemotherapy was not recommended.  Bilateral screening mammogramon 07/24/2016 revealed a possible area of distortion in the right breast.  Diagnostic mammogram on 08/10/2016 revealed 2 suspicious masses in the upper-outer quadrant of the right breast in a broad area of confluent distortion in the upper-outer quadrant of the right breast. There are at least 3 areas of confluent distortion spanning approximately 6.5 cm of tissue. Ultrasound on 07/31/2016 revealed an irregular hypoechoic 1.2 x 1.1 x 1.2 cm at the 9:30 position 7 cm from the nipple. In addition, there was an irregular 1.9 x 0.8 x 1.2 cm mass at the 10 o'clock position 5 cm from the nipple. There were multiple small normal-appearing lymph nodes.   Breast MRIon 09/16/2016 revealed an irregular 1.6 x 1.2 x 1.6 cm enhancing mass adjacent to the clip artifact within the upper, outer right breast, middle depth, corresponding to the mass seen at 10 o'clock. There was an additional irregular enhancing mass adjacent to the more posterior clip artifact measuring 2.6 x 1.4 x 1.6 cm, corresponding to the mass seen at 9:30 position. Together, these 2 masses span 6.3 cm of breast tissue from anterior to posterior. There was no definite additional suspicious area of enhancement within the right breast to correspond to the third area of distortion identified on diagnostic mammogram dated 08/10/2016. Left breast revealed no suspicious mass or enhancement.  Bilateral diagnostic mammogram on 09/02/2018 revealed no evidence of malignancy in either breast, s/p right lumpectomy.   She received radiationfrom 11/15/2016 - 01/08/2017. She was Femara(01/17/2017 - 05/16/2017) secondary to arthralgias. She discontinued Arimidexafter 19 days (last 06/14/2017) secondary to fatigue and polyarthralgias. She beganAromasinon 06/27/2017.  CA27.29 has been followed: 13.4 on 09/06/2016, 13.0 on 05/16/2017, 17.1 on 08/29/2017, 12.1 on 06/04/2018, 13.8 on 10/01/2018, and 15.6 on 02/04/2019.  Bone density studyon 09/30/2015 revealed osteopeniawith a T-score of -2.1 in the right femoral neck.  Bone density studyrevealed osteoporosis on 10/02/2017 with a T-score of -3.1. She began Proliaon 02/14/2017 (last 10/11/2018).  She was diagnosed with iron deficiency anemiaand B12 deficiencyon 08/29/2017. TSH and folate were normal. She is on oral B12 500 mcg a day. Intrinsic factor and anti-parietal cell antibodies were normal on 01/02/2018. B12 was 888 on 01/29/2018 and 544 on 02/04/2019.  She received Venoferweekly x 3 (10/31/2017 - 11/13/2017) and x 4 (06/05/2018 - 06/26/2018).  Ferritinhas been followed: 10 on 08/29/2017, 174 on 12/12/2017, 74 on 01/02/2018, 64 on 01/29/2018, 3 on 06/04/2018, 205 on 10/01/2018, and 162 on 02/04/2019.  EGDand colonoscopyon 12/03/2017. Polyps were noted. Pathology was negative for dysplasia and malignancy.   Bilateral diagnostic mammogram on 09/02/2018 revealed no evidence of malignancy in either breast, s/p right lumpectomy.   Symptomatically, she is doing well.  She voices no concerns.  Exam is stable.  Plan: 1.   Review  labs from 02/04/2019. 2.   MultifocalRIGHTbreast cancer             Clinically, she is doing well.             Exam reveals no evidence of recurrent disease.  Mammogram on 09/02/2018 revealed no recurrent disease.             Continue Aromasin. 3.   Iron deficiency anemia  Hematocrit 37.9  Hemoglobin 12.6.  MCV 99.0.             Ferritin 162.             Patient off oral iron.             Consider capsule study if ferritin drops to < 100.             Continue to monitor. 4.   Osteoporosis Patient on calcium 1200 mg and vitamin D 800 IU daily. Patient receives Prolia every 6 months (last 10/11/2018). Patient denies any dental concerns. Next bone density due 10/03/2019. 5.   B12 deficiency B12 was  888 on 01/29/2018. Patient decreased B12 from 1000 mcg/day to 500 mcg/day. B12 goal is >= 400. B12 level was 544 today. 6.   RTC on 04/14/2019 for MD assessment, labs (CBC with diff, CMP, CA27.29),  and Prolia.  I discussed the assessment and treatment plan with the patient.  The patient was provided an opportunity to ask questions and all were answered.  The patient agreed with the plan and demonstrated an understanding of the instructions.  The patient was advised to call back if the symptoms worsen or if the condition fails to improve as anticipated.  I provided 15 minutes of face-to-face time during this this encounter and > 50% was spent counseling as documented under my assessment and plan.    Lequita Asal, MD, PhD    02/05/2019, 1:49 PM  I, Selena Batten, am acting as scribe for [employer]. Mike Gip, MD, PhD.  I,  C. Mike Gip, MD, have reviewed the above documentation for accuracy and completeness, and I agree with the above.

## 2019-02-04 ENCOUNTER — Other Ambulatory Visit: Payer: Self-pay

## 2019-02-04 ENCOUNTER — Inpatient Hospital Stay: Payer: Medicare HMO | Attending: Hematology and Oncology

## 2019-02-04 ENCOUNTER — Encounter: Payer: Self-pay | Admitting: Hematology and Oncology

## 2019-02-04 DIAGNOSIS — M818 Other osteoporosis without current pathological fracture: Secondary | ICD-10-CM | POA: Diagnosis not present

## 2019-02-04 DIAGNOSIS — E538 Deficiency of other specified B group vitamins: Secondary | ICD-10-CM | POA: Diagnosis not present

## 2019-02-04 DIAGNOSIS — D509 Iron deficiency anemia, unspecified: Secondary | ICD-10-CM | POA: Diagnosis not present

## 2019-02-04 DIAGNOSIS — C50411 Malignant neoplasm of upper-outer quadrant of right female breast: Secondary | ICD-10-CM

## 2019-02-04 DIAGNOSIS — Z79811 Long term (current) use of aromatase inhibitors: Secondary | ICD-10-CM | POA: Insufficient documentation

## 2019-02-04 DIAGNOSIS — C50911 Malignant neoplasm of unspecified site of right female breast: Secondary | ICD-10-CM | POA: Diagnosis present

## 2019-02-04 DIAGNOSIS — Z87891 Personal history of nicotine dependence: Secondary | ICD-10-CM | POA: Diagnosis not present

## 2019-02-04 DIAGNOSIS — M25569 Pain in unspecified knee: Secondary | ICD-10-CM | POA: Diagnosis not present

## 2019-02-04 DIAGNOSIS — Z79899 Other long term (current) drug therapy: Secondary | ICD-10-CM | POA: Insufficient documentation

## 2019-02-04 DIAGNOSIS — I10 Essential (primary) hypertension: Secondary | ICD-10-CM | POA: Diagnosis not present

## 2019-02-04 LAB — CBC WITH DIFFERENTIAL/PLATELET
Abs Immature Granulocytes: 0.04 10*3/uL (ref 0.00–0.07)
Basophils Absolute: 0.1 10*3/uL (ref 0.0–0.1)
Basophils Relative: 1 %
Eosinophils Absolute: 0.1 10*3/uL (ref 0.0–0.5)
Eosinophils Relative: 2 %
HCT: 37.9 % (ref 36.0–46.0)
Hemoglobin: 12.6 g/dL (ref 12.0–15.0)
Immature Granulocytes: 1 %
Lymphocytes Relative: 27 %
Lymphs Abs: 1.9 10*3/uL (ref 0.7–4.0)
MCH: 32.9 pg (ref 26.0–34.0)
MCHC: 33.2 g/dL (ref 30.0–36.0)
MCV: 99 fL (ref 80.0–100.0)
Monocytes Absolute: 0.8 10*3/uL (ref 0.1–1.0)
Monocytes Relative: 11 %
Neutro Abs: 4.1 10*3/uL (ref 1.7–7.7)
Neutrophils Relative %: 58 %
Platelets: 219 10*3/uL (ref 150–400)
RBC: 3.83 MIL/uL — ABNORMAL LOW (ref 3.87–5.11)
RDW: 13.3 % (ref 11.5–15.5)
WBC: 7 10*3/uL (ref 4.0–10.5)
nRBC: 0 % (ref 0.0–0.2)

## 2019-02-04 LAB — HEPATIC FUNCTION PANEL
ALT: 19 U/L (ref 0–44)
AST: 23 U/L (ref 15–41)
Albumin: 4.9 g/dL (ref 3.5–5.0)
Alkaline Phosphatase: 42 U/L (ref 38–126)
Bilirubin, Direct: <0.1 mg/dL (ref 0.0–0.2)
Total Bilirubin: 0.5 mg/dL (ref 0.3–1.2)
Total Protein: 7.4 g/dL (ref 6.5–8.1)

## 2019-02-04 LAB — FERRITIN: Ferritin: 162 ng/mL (ref 11–307)

## 2019-02-04 NOTE — Progress Notes (Signed)
Patient states that she in on a new vitamin for her eyes (cataracts and macular degeneration). Will bring in the bottle or a picture of it tomorrow.

## 2019-02-05 ENCOUNTER — Inpatient Hospital Stay: Payer: Medicare HMO

## 2019-02-05 ENCOUNTER — Encounter: Payer: Self-pay | Admitting: Hematology and Oncology

## 2019-02-05 ENCOUNTER — Inpatient Hospital Stay (HOSPITAL_BASED_OUTPATIENT_CLINIC_OR_DEPARTMENT_OTHER): Payer: Medicare HMO | Admitting: Hematology and Oncology

## 2019-02-05 ENCOUNTER — Other Ambulatory Visit: Payer: Self-pay

## 2019-02-05 VITALS — BP 131/67 | HR 84 | Temp 97.5°F | Resp 16 | Wt 208.1 lb

## 2019-02-05 DIAGNOSIS — M810 Age-related osteoporosis without current pathological fracture: Secondary | ICD-10-CM | POA: Diagnosis not present

## 2019-02-05 DIAGNOSIS — D509 Iron deficiency anemia, unspecified: Secondary | ICD-10-CM | POA: Diagnosis not present

## 2019-02-05 DIAGNOSIS — C50411 Malignant neoplasm of upper-outer quadrant of right female breast: Secondary | ICD-10-CM | POA: Diagnosis not present

## 2019-02-05 DIAGNOSIS — E538 Deficiency of other specified B group vitamins: Secondary | ICD-10-CM

## 2019-02-05 DIAGNOSIS — Z170 Estrogen receptor positive status [ER+]: Secondary | ICD-10-CM

## 2019-02-05 DIAGNOSIS — C50911 Malignant neoplasm of unspecified site of right female breast: Secondary | ICD-10-CM | POA: Diagnosis not present

## 2019-02-05 LAB — CANCER ANTIGEN 27.29: CA 27.29: 15.6 U/mL (ref 0.0–38.6)

## 2019-02-05 LAB — BASIC METABOLIC PANEL
Anion gap: 13 (ref 5–15)
BUN: 19 mg/dL (ref 8–23)
CO2: 22 mmol/L (ref 22–32)
Calcium: 9.5 mg/dL (ref 8.9–10.3)
Chloride: 105 mmol/L (ref 98–111)
Creatinine, Ser: 1.36 mg/dL — ABNORMAL HIGH (ref 0.44–1.00)
GFR calc Af Amer: 43 mL/min — ABNORMAL LOW (ref >60)
GFR calc non Af Amer: 37 mL/min — ABNORMAL LOW (ref >60)
Glucose, Bld: 77 mg/dL (ref 70–99)
Potassium: 4.1 mmol/L (ref 3.5–5.1)
Sodium: 140 mmol/L (ref 135–145)

## 2019-02-05 LAB — VITAMIN B12: Vitamin B-12: 544 pg/mL (ref 180–914)

## 2019-04-10 ENCOUNTER — Other Ambulatory Visit: Payer: Self-pay

## 2019-04-10 ENCOUNTER — Inpatient Hospital Stay: Payer: Medicare HMO

## 2019-04-10 ENCOUNTER — Inpatient Hospital Stay: Payer: Medicare HMO | Attending: Hematology and Oncology

## 2019-04-10 VITALS — BP 155/75 | HR 90

## 2019-04-10 DIAGNOSIS — Z170 Estrogen receptor positive status [ER+]: Secondary | ICD-10-CM | POA: Insufficient documentation

## 2019-04-10 DIAGNOSIS — D509 Iron deficiency anemia, unspecified: Secondary | ICD-10-CM | POA: Insufficient documentation

## 2019-04-10 DIAGNOSIS — Z923 Personal history of irradiation: Secondary | ICD-10-CM | POA: Insufficient documentation

## 2019-04-10 DIAGNOSIS — E538 Deficiency of other specified B group vitamins: Secondary | ICD-10-CM | POA: Insufficient documentation

## 2019-04-10 DIAGNOSIS — Z79810 Long term (current) use of selective estrogen receptor modulators (SERMs): Secondary | ICD-10-CM | POA: Insufficient documentation

## 2019-04-10 DIAGNOSIS — C50411 Malignant neoplasm of upper-outer quadrant of right female breast: Secondary | ICD-10-CM | POA: Insufficient documentation

## 2019-04-10 DIAGNOSIS — M810 Age-related osteoporosis without current pathological fracture: Secondary | ICD-10-CM | POA: Insufficient documentation

## 2019-04-10 LAB — COMPREHENSIVE METABOLIC PANEL
ALT: 23 U/L (ref 0–44)
AST: 24 U/L (ref 15–41)
Albumin: 4.4 g/dL (ref 3.5–5.0)
Alkaline Phosphatase: 50 U/L (ref 38–126)
Anion gap: 8 (ref 5–15)
BUN: 35 mg/dL — ABNORMAL HIGH (ref 8–23)
CO2: 25 mmol/L (ref 22–32)
Calcium: 9.7 mg/dL (ref 8.9–10.3)
Chloride: 104 mmol/L (ref 98–111)
Creatinine, Ser: 1.44 mg/dL — ABNORMAL HIGH (ref 0.44–1.00)
GFR calc Af Amer: 40 mL/min — ABNORMAL LOW (ref >60)
GFR calc non Af Amer: 34 mL/min — ABNORMAL LOW (ref >60)
Glucose, Bld: 110 mg/dL — ABNORMAL HIGH (ref 70–99)
Potassium: 4.1 mmol/L (ref 3.5–5.1)
Sodium: 137 mmol/L (ref 135–145)
Total Bilirubin: 0.3 mg/dL (ref 0.3–1.2)
Total Protein: 7.2 g/dL (ref 6.5–8.1)

## 2019-04-10 LAB — CBC WITH DIFFERENTIAL/PLATELET
Abs Immature Granulocytes: 0.04 10*3/uL (ref 0.00–0.07)
Basophils Absolute: 0.1 10*3/uL (ref 0.0–0.1)
Basophils Relative: 1 %
Eosinophils Absolute: 0.1 10*3/uL (ref 0.0–0.5)
Eosinophils Relative: 2 %
HCT: 36.9 % (ref 36.0–46.0)
Hemoglobin: 12.3 g/dL (ref 12.0–15.0)
Immature Granulocytes: 1 %
Lymphocytes Relative: 21 %
Lymphs Abs: 1.3 10*3/uL (ref 0.7–4.0)
MCH: 33.2 pg (ref 26.0–34.0)
MCHC: 33.3 g/dL (ref 30.0–36.0)
MCV: 99.5 fL (ref 80.0–100.0)
Monocytes Absolute: 0.8 10*3/uL (ref 0.1–1.0)
Monocytes Relative: 12 %
Neutro Abs: 4 10*3/uL (ref 1.7–7.7)
Neutrophils Relative %: 63 %
Platelets: 199 10*3/uL (ref 150–400)
RBC: 3.71 MIL/uL — ABNORMAL LOW (ref 3.87–5.11)
RDW: 13.2 % (ref 11.5–15.5)
WBC: 6.3 10*3/uL (ref 4.0–10.5)
nRBC: 0 % (ref 0.0–0.2)

## 2019-04-10 MED ORDER — DENOSUMAB 60 MG/ML ~~LOC~~ SOSY
60.0000 mg | PREFILLED_SYRINGE | Freq: Once | SUBCUTANEOUS | Status: AC
  Administered 2019-04-10: 60 mg via SUBCUTANEOUS

## 2019-04-10 NOTE — Patient Instructions (Signed)
Denosumab injection What is this medicine? DENOSUMAB (den oh sue mab) slows bone breakdown. Prolia is used to treat osteoporosis in women after menopause and in men, and in people who are taking corticosteroids for 6 months or more. Xgeva is used to treat a high calcium level due to cancer and to prevent bone fractures and other bone problems caused by multiple myeloma or cancer bone metastases. Xgeva is also used to treat giant cell tumor of the bone. This medicine may be used for other purposes; ask your health care provider or pharmacist if you have questions. COMMON BRAND NAME(S): Prolia, XGEVA What should I tell my health care provider before I take this medicine? They need to know if you have any of these conditions:  dental disease  having surgery or tooth extraction  infection  kidney disease  low levels of calcium or Vitamin D in the blood  malnutrition  on hemodialysis  skin conditions or sensitivity  thyroid or parathyroid disease  an unusual reaction to denosumab, other medicines, foods, dyes, or preservatives  pregnant or trying to get pregnant  breast-feeding How should I use this medicine? This medicine is for injection under the skin. It is given by a health care professional in a hospital or clinic setting. A special MedGuide will be given to you before each treatment. Be sure to read this information carefully each time. For Prolia, talk to your pediatrician regarding the use of this medicine in children. Special care may be needed. For Xgeva, talk to your pediatrician regarding the use of this medicine in children. While this drug may be prescribed for children as young as 13 years for selected conditions, precautions do apply. Overdosage: If you think you have taken too much of this medicine contact a poison control center or emergency room at once. NOTE: This medicine is only for you. Do not share this medicine with others. What if I miss a dose? It is  important not to miss your dose. Call your doctor or health care professional if you are unable to keep an appointment. What may interact with this medicine? Do not take this medicine with any of the following medications:  other medicines containing denosumab This medicine may also interact with the following medications:  medicines that lower your chance of fighting infection  steroid medicines like prednisone or cortisone This list may not describe all possible interactions. Give your health care provider a list of all the medicines, herbs, non-prescription drugs, or dietary supplements you use. Also tell them if you smoke, drink alcohol, or use illegal drugs. Some items may interact with your medicine. What should I watch for while using this medicine? Visit your doctor or health care professional for regular checks on your progress. Your doctor or health care professional may order blood tests and other tests to see how you are doing. Call your doctor or health care professional for advice if you get a fever, chills or sore throat, or other symptoms of a cold or flu. Do not treat yourself. This drug may decrease your body's ability to fight infection. Try to avoid being around people who are sick. You should make sure you get enough calcium and vitamin D while you are taking this medicine, unless your doctor tells you not to. Discuss the foods you eat and the vitamins you take with your health care professional. See your dentist regularly. Brush and floss your teeth as directed. Before you have any dental work done, tell your dentist you are   receiving this medicine. Do not become pregnant while taking this medicine or for 5 months after stopping it. Talk with your doctor or health care professional about your birth control options while taking this medicine. Women should inform their doctor if they wish to become pregnant or think they might be pregnant. There is a potential for serious side  effects to an unborn child. Talk to your health care professional or pharmacist for more information. What side effects may I notice from receiving this medicine? Side effects that you should report to your doctor or health care professional as soon as possible:  allergic reactions like skin rash, itching or hives, swelling of the face, lips, or tongue  bone pain  breathing problems  dizziness  jaw pain, especially after dental work  redness, blistering, peeling of the skin  signs and symptoms of infection like fever or chills; cough; sore throat; pain or trouble passing urine  signs of low calcium like fast heartbeat, muscle cramps or muscle pain; pain, tingling, numbness in the hands or feet; seizures  unusual bleeding or bruising  unusually weak or tired Side effects that usually do not require medical attention (report to your doctor or health care professional if they continue or are bothersome):  constipation  diarrhea  headache  joint pain  loss of appetite  muscle pain  runny nose  tiredness  upset stomach This list may not describe all possible side effects. Call your doctor for medical advice about side effects. You may report side effects to FDA at 1-800-FDA-1088. Where should I keep my medicine? This medicine is only given in a clinic, doctor's office, or other health care setting and will not be stored at home. NOTE: This sheet is a summary. It may not cover all possible information. If you have questions about this medicine, talk to your doctor, pharmacist, or health care provider.  2020 Elsevier/Gold Standard (2017-07-20 16:10:44)

## 2019-04-11 LAB — CANCER ANTIGEN 27.29: CA 27.29: 18.3 U/mL (ref 0.0–38.6)

## 2019-04-12 NOTE — Progress Notes (Signed)
[facility]  [address], [address] [place], [place] [address] Phone: [phone]  Fax: [phone]  Telephone Visit:  04/14/2019  Referring physician: Clarisse Gouge, MD  I connected with Allison Hall on 04/14/19 at 3:59 PM by telephone and verified that I was speaking with the correct person using 2 identifiers.  The patient was at  home.  I discussed the limitations, risk, security and privacy concerns of performing an evaluation and management service by telephone and the availability of in person appointments.  I also discussed with the patient that there may be a patient responsible charge related to this service.  The patient expressed understanding and agreed to proceed.   Chief Complaint: Allison Hall is a 79 y.o. female with multi-focal right breast cancer, osteoporosis, iron deficiency anemia,and B12 deficiency who is seen for 2 month assessment   HPI: The patient was last seen in the medical oncology clinic on 02/05/2019. At that time, she was doing well.  She voiced no concerns. Exam was stable. Hematocrit was 37.9, hemoglobin 12.6, MCV 99.0, platelets 219,000, WBC 7000, ANC 4100. Ferritin was 162. B12 was 544.  Creatinine was 1.36.  CA 27.29 was 15.6.  Labs on 04/10/2019 revealed a hematocrit 36.9, hemoglobin 12.3, MCV 99.5, platelets 199,000, WBC 6300, ANC 4000.  Creatinine was 1.44.  CA 27.29 was 18.3. She received Prolia.  During the interim, she still has some pain in her knees, but is doing well overall. She can receive cortisone shorts every 3 months. She take B12 1,000 mcg every other day.   Symptomatically, she feels good.  She has more energy and says she feels back to normal. She had 4 meetings last week and didn't "give out".  She takes her calcium, and has had no dental issues.  She is performing monthly breast exams, with no concerns.  Her balance is about the same. She hasn't had any falls.   Past Medical History:   Diagnosis Date  . Anxiety   . Breast cancer ([place]) 08/24/2016   right  . Cancer ([place])    skin ca  . Colon polyp   . Depression   . GERD (gastroesophageal reflux disease)   . Hypercholesteremia   . Hypertension   . Osteoarthritis   . Personal history of radiation therapy   . Thyroid nodule 04/17/2016    Past Surgical History:  Procedure Laterality Date  . ABDOMINAL HYSTERECTOMY    . APPENDECTOMY    . BREAST BIOPSY Right 08/24/2016   9:30 - invasive mammary carcinoma with lobular features  . BREAST BIOPSY Right 08/24/2016   10:00 - invasive mammary carcinoma with lobular features  . BREAST LUMPECTOMY Right 10/09/2016  . COLONOSCOPY W/ POLYPECTOMY  2003, 2008, 2014  . COLONOSCOPY WITH PROPOFOL N/A 12/03/2017   Procedure: COLONOSCOPY WITH PROPOFOL;  Surgeon: Manya Silvas, MD;  Location[facility] ENDOSCOPY;  Service: Endoscopy;  Laterality: N/A;  . ESOPHAGOGASTRODUODENOSCOPY     foreign body  . ESOPHAGOGASTRODUODENOSCOPY (EGD) WITH PROPOFOL N/A 12/03/2017   Procedure: ESOPHAGOGASTRODUODENOSCOPY (EGD) WITH PROPOFOL;  Surgeon: Manya Silvas, MD;  Location: [facility] ENDOSCOPY;  Service: Endoscopy;  Laterality: N/A;  . JOINT REPLACEMENT Right 2008   TKR  . NOSE SURGERY    . PARTIAL MASTECTOMY WITH NEEDLE LOCALIZATION Right 09/29/2016   Procedure: PARTIAL MASTECTOMY WITH NEEDLE LOCALIZATION;  Surgeon: [name], MD;  Location: [facility] ORS;  Service: General;  Laterality: Right;  . REPLACEMENT TOTAL KNEE Right 2008  . SENTINEL NODE BIOPSY  Right 09/29/2016   Procedure: SENTINEL NODE BIOPSY;  Surgeon: [name], MD;  Location: [facility] ORS;  Service: General;  Laterality: Right;    Family History  Problem Relation Age of Onset  . Hypertension Mother   . Depression Mother   . Heart failure Father   . Obesity Brother   . Heart disease Brother   . Stroke Brother   . Depression Brother   . Breast cancer Cousin   . Breast cancer Cousin   . Breast cancer Cousin      Social History:  reports that she quit smoking about 53 years ago. She has a 10.00 pack-year smoking history. She has never used smokeless tobacco. She reports current alcohol use of about 7.0 standard drinks of alcohol per week. She reports that she does not use drugs. She previously smoked 1/2 pack/day x 10 years. She stopped smoking on 10/11/1978. She drinks 6 glasses of wine/week. She has 3 children (2 sons and 1 daughter). She is a retired Clinical cytogeneticist for [employer]. She likes to do crossword puzzles. Her daughter's name is [name]. Her husband is in an assisted living facility [facility]) secondary to dementia. She lives in [place] The patient is alone today.  Participants in the patient's visit and their role in the encounter included the patient, Samul Dada, scribe,  and [employer], CMA, today.  The intake visit was provided by Samul Dada, scribe, and Vito Berger, CMA.    Allergies:  Allergies  Allergen Reactions  . Sulfamethoxazole Other (See Comments)    Anxiety and imsomnia  . Other Itching    Vit C-vit E-copper-zinc-lutein - itching and fatigue (per [facility])  . Statins Other (See Comments)    Muscle pain   . Trazodone Other (See Comments)    Pt states she felt "unsteady"   . Ciprofloxacin Anxiety  . Sulfa Antibiotics Anxiety    Current Medications: Current Outpatient Medications  Medication Sig Dispense Refill  . acetaminophen (TYLENOL) 500 MG tablet Take 650 mg by mouth every 6 (six) hours as needed.     Marland Kitchen amLODipine (NORVASC) 10 MG tablet TAKE 1 TABLET BY MOUTH EVERY DAY    . buPROPion (WELLBUTRIN XL) 300 MG 24 hr tablet Take by mouth.    . busPIRone (BUSPAR) 10 MG tablet Take 10 mg by mouth 3 (three) times daily.    . calcium carbonate 1250 MG capsule Take 1,250 mg by mouth daily.     . Cholecalciferol (VITAMIN D3) 1000 units CAPS Take 2,000 Units by mouth 2 (two) times daily.     Marland Kitchen denosumab (PROLIA) 60 MG/ML SOLN injection Inject 60 mg into the  skin every 6 (six) months. Administer in upper arm, thigh, or abdomen    . exemestane (AROMASIN) 25 MG tablet Take 1 tablet (25 mg total) by mouth daily after breakfast. 90 tablet 3  . lisinopril (ZESTRIL) 40 MG tablet Take 40 mg by mouth daily.    . Magnesium 250 MG TABS Take 500 mg by mouth daily.     Marland Kitchen omeprazole (PRILOSEC) 40 MG capsule Take 40 mg by mouth daily.     . rosuvastatin (CRESTOR) 5 MG tablet Take 5 mg by mouth daily.    . sertraline (ZOLOFT) 100 MG tablet Take 1.5 tablets (150 mg total) by mouth daily. (Patient taking differently: Take 100 mg by mouth daily. ) 45 tablet 1  . Turmeric Curcumin 500 MG CAPS Take 1,500 mg by mouth daily.     . vitamin B-12 (CYANOCOBALAMIN)  500 MCG tablet Take 1,000 mcg by mouth every other day.     . Ferrous Sulfate Dried ([employer] SLOW RELEASE IRON) 45 MG TBCR Take 1 tablet by mouth daily. With Orange juice    . loperamide (IMODIUM) 2 MG capsule Take 2 mg by mouth as needed for diarrhea or loose stools.     . Multiple Vitamins-Minerals (PRESERVISION AREDS PO) Take 1 capsule by mouth 2 (two) times daily.     No current facility-administered medications for this visit.    Review of Systems  Constitutional: Negative.  Negative for chills, diaphoresis, fever, malaise/fatigue and weight loss.       Feels "good".  Energy level is normal.  HENT: Negative.  Negative for congestion, ear pain, hearing loss, nosebleeds, sinus pain and sore throat.   Eyes: Negative.  Negative for blurred vision, double vision and photophobia.  Respiratory: Negative.  Negative for cough, hemoptysis, sputum production and shortness of breath.   Cardiovascular: Negative.  Negative for chest pain, palpitations, orthopnea, leg swelling and PND.  Gastrointestinal: Negative for abdominal pain, blood in stool, constipation, diarrhea, melena, nausea and vomiting.       Normal bowels with mild leakage.  Genitourinary: Negative.  Negative for dysuria, frequency, hematuria and urgency.   Musculoskeletal: Positive for joint pain (knees; some improvement with injections). Negative for back pain, falls, myalgias and neck pain.  Skin: Negative.  Negative for itching and rash.  Neurological: Negative for dizziness, tremors, sensory change, speech change, focal weakness, weakness and headaches.       Poor balance.  Endo/Heme/Allergies: Negative.  Does not bruise/bleed easily.  Psychiatric/Behavioral: Negative.  Negative for depression and memory loss. The patient is not nervous/anxious and does not have insomnia.   All other systems reviewed and are negative.  Performance status (ECOG): 1  Vitals There were no vitals taken for this visit.   Physical Exam  Constitutional: She is oriented to person, place, and time.  Neurological: She is alert and oriented to person, place, and time.  Psychiatric: She has a normal mood and affect. Her behavior is normal. Judgment and thought content normal.  Nursing note reviewed.   No visits with results within 3 Day(s) from this visit.  Latest known visit with results is:  Appointment on 04/10/2019  Component Date Value Ref Range Status  . Sodium 04/10/2019 137  135 - 145 mmol/L Final  . Potassium 04/10/2019 4.1  3.5 - 5.1 mmol/L Final  . Chloride 04/10/2019 104  98 - 111 mmol/L Final  . CO2 04/10/2019 25  22 - 32 mmol/L Final  . Glucose, Bld 04/10/2019 110* 70 - 99 mg/dL Final  . BUN 04/10/2019 35* 8 - 23 mg/dL Final  . Creatinine, Ser 04/10/2019 1.44* 0.44 - 1.00 mg/dL Final  . Calcium 04/10/2019 9.7  8.9 - 10.3 mg/dL Final  . Total Protein 04/10/2019 7.2  6.5 - 8.1 g/dL Final  . Albumin 04/10/2019 4.4  3.5 - 5.0 g/dL Final  . AST 04/10/2019 24  15 - 41 U/L Final  . ALT 04/10/2019 23  0 - 44 U/L Final  . Alkaline Phosphatase 04/10/2019 50  38 - 126 U/L Final  . Total Bilirubin 04/10/2019 0.3  0.3 - 1.2 mg/dL Final  . GFR calc non Af Amer 04/10/2019 34* >60 mL/min Final  . GFR calc Af Amer 04/10/2019 40* >60 mL/min Final  . Anion  gap 04/10/2019 8  5 - 15 Final   Performed at [facility], [address],  [place], [place] [address]  . WBC 04/10/2019 6.3  4.0 - 10.5 K/uL Final  . RBC 04/10/2019 3.71* 3.87 - 5.11 MIL/uL Final  . Hemoglobin 04/10/2019 12.3  12.0 - 15.0 g/dL Final  . HCT 04/10/2019 36.9  36.0 - 46.0 % Final  . MCV 04/10/2019 99.5  80.0 - 100.0 fL Final  . MCH 04/10/2019 33.2  26.0 - 34.0 pg Final  . MCHC 04/10/2019 33.3  30.0 - 36.0 g/dL Final  . RDW 04/10/2019 13.2  11.5 - 15.5 % Final  . Platelets 04/10/2019 199  150 - 400 K/uL Final  . nRBC 04/10/2019 0.0  0.0 - 0.2 % Final  . Neutrophils Relative % 04/10/2019 63  % Final  . Neutro Abs 04/10/2019 4.0  1.7 - 7.7 K/uL Final  . Lymphocytes Relative 04/10/2019 21  % Final  . Lymphs Abs 04/10/2019 1.3  0.7 - 4.0 K/uL Final  . Monocytes Relative 04/10/2019 12  % Final  . Monocytes Absolute 04/10/2019 0.8  0.1 - 1.0 K/uL Final  . Eosinophils Relative 04/10/2019 2  % Final  . Eosinophils Absolute 04/10/2019 0.1  0.0 - 0.5 K/uL Final  . Basophils Relative 04/10/2019 1  % Final  . Basophils Absolute 04/10/2019 0.1  0.0 - 0.1 K/uL Final  . Immature Granulocytes 04/10/2019 1  % Final  . Abs Immature Granulocytes 04/10/2019 0.04  0.00 - 0.07 K/uL Final   Performed at [facility], [address], [place], [place] [address]  . CA 27.29 04/10/2019 18.3  0.0 - 38.6 U/mL Final   Comment: (NOTE) Siemens Centaur Immunochemiluminometric Methodology [facility]) Values obtained with different assay methods or kits cannot be used interchangeably. Results cannot be interpreted as absolute evidence of the presence or absence of malignant disease. Performed At: [facility] [place], [place] [id number] Rush Farmer MD CX:[id number]     Assessment:  Allison Hall is a 79 y.o. female withmulti-focalright breast cancers/p partial mastectomy with sentinel lymph node biopsy on 09/29/2016. Pathology revealed a 1.5  cm grade I invasive carcinoma, tubulo-lobular variant and a 1.6 cm grade I invasive carcinoma, tubulo-lobular variant. Three sentinel lymph nodes were negative. Tumor was ER + (>90%), PR + (>90%) and Her2/neu 2+ (negative by FISH). Pathologic stage was T1c(m) N0(sn). CA27.29was 13.4 on 09/06/2016.  MammaPrintrevealed a 29% risk of recurrence (high). She was seen for second opinion at [facility] on 10/25/2016. Chemotherapy was not recommended.  Bilateral screening mammogramon 07/24/2016 revealed a possible area of distortion in the right breast. Diagnostic mammogram on 08/10/2016 revealed 2 suspicious masses in the upper-outer quadrant of the right breast in a broad area of confluent distortion in the upper-outer quadrant of the right breast. There are at least 3 areas of confluent distortion spanning approximately 6.5 cm of tissue. Ultrasound on 07/31/2016 revealed an irregular hypoechoic 1.2 x 1.1 x 1.2 cm at the 9:30 position 7 cm from the nipple. In addition, there was an irregular 1.9 x 0.8 x 1.2 cm mass at the 10 o'clock position 5 cm from the nipple. There were multiple small normal-appearing lymph nodes.   Breast MRIon 09/16/2016 revealed an irregular 1.6 x 1.2 x 1.6 cm enhancing mass adjacent to the clip artifact within the upper, outer right breast, middle depth, corresponding to the mass seen at 10 o'clock. There was an additional irregular enhancing mass adjacent to the more posterior clip artifact measuring 2.6 x 1.4 x 1.6 cm, corresponding to the mass seen at 9:30 position. Together, these 2 masses  span 6.3 cm of breast tissue from anterior to posterior. There was no definite additional suspicious area of enhancement within the right breast to correspond to the third area of distortion identified on diagnostic mammogram dated 08/10/2016. Left breast revealed no suspicious mass or enhancement.  Bilateraldiagnostic mammogramon 09/02/2018 revealed no evidence of malignancy in  either breast, s/p right lumpectomy.   She received radiationfrom 11/15/2016 - 01/08/2017. She was Femara(01/17/2017 - 05/16/2017) secondary to arthralgias. She discontinued Arimidexafter 19 days (last 06/14/2017) secondary to fatigue and polyarthralgias. She beganAromasinon 06/27/2017.  CA27.29 has been followed: 13.4 on 09/06/2016, 13.0 on 05/16/2017, 17.1 on 08/29/2017, 12.1 on 06/04/2018, 13.8 on 10/01/2018, 15.6 on 02/04/2019, and 18.3 on 04/10/2019.  Bone density studyon 09/30/2015 revealed osteopeniawith a T-score of -2.1 in the right femoral neck. Bone density studyrevealed osteoporosison 07/09/2019witha T-score of -3.1. She began Proliaon 02/14/2017 (last 04/10/2019).  She was diagnosed with iron deficiency anemiaand B12 deficiencyon 08/29/2017. TSH and folate were normal. She is on oral B12 500 mcg a day. Intrinsic factor and anti-parietal cell antibodies were normal on 01/02/2018. B12 was 888 on 01/29/2018 and 544 on 02/04/2019.  Folate was 8.4 on 08/29/2017.   She received Venoferweekly x 3 (10/31/2017 - 11/13/2017)and x 4 (06/05/2018 - 06/26/2018).  Ferritinhas been followed: 10 on 08/29/2017, 174 on 12/12/2017, 74 on 01/02/2018, 64 on 01/29/2018, 3 on 06/04/2018, 205 on 10/01/2018, and 162 on 02/04/2019.  EGDand colonoscopyon 12/03/2017. Polyps were noted. Pathology was negative for dysplasia and malignancy.  Bilateral diagnostic mammogram on 09/02/2018 revealed no evidence of malignancy in either breast, s/p right lumpectomy.  Symptomatically, she is doing well.  She voices no concerns.  Plan: 1.   Review labs from 04/10/2019. 2.MultifocalRIGHTbreast cancer Clinically, she continues to do well Exam on 02/05/2019 revealed no evidence of recurrent disease.             Encourage self breast exams.  Mammogram on 09/02/2018 revealed no recurrent disease.  Schedule mammogram on 09/02/2019. Continue  Aromasin.  RN to [employer] regarding dropping Aromasin to tier one (previously done by Aaron Edelman). 3.Iron deficiency anemia Hematocrit 36.9 Hemoglobin 12.3. MCV 99.5. Ferritin 162 on 02/04/2019. Patient off oral iron. Consider capsule study if ferritin drops to <100. Continue to monitor. 4.Osteoporosis Patient on calcium 1200 mg and vitamin D 800 IU daily. Patient receives Prolia every 6 months(last 04/10/2019). Patient notes no dental concerns. Schedule bone density on 10/03/2019. 5.B12 deficiency B12 was544 on 02/04/2019. Patient decreased B12 from 1000 mcg/day to 500 mcg/day. B12 goal is >= 400. B12 level was 544 on 02/04/2019. Monitor folate annually (last 08/29/2017). 6.   RTC on 10/10/2019 for MD assessment, labs (CBC with diff, CMP, CA 27.29, folate), and Prolia.  I discussed the assessment and treatment plan with the patient.  The patient was provided an opportunity to ask questions and all were answered.  The patient agreed with the plan and demonstrated an understanding of the instructions.  The patient was advised to call back if the symptoms worsen or if the condition fails to improve as anticipated.  I provided 23 minutes (3:59 PM - 4:22 PM) of non face-to-face time during this this encounter and > 50% was spent counseling as documented under my assessment and plan.    Lequita Asal, MD, PhD    04/14/2019, 4:22 PM  I, Samul Dada, am acting as a scribe for Lequita Asal, MD.  I, Ebro Mike Gip, MD, have reviewed the above documentation for accuracy and completeness, and I agree with  the above.

## 2019-04-14 ENCOUNTER — Encounter: Payer: Self-pay | Admitting: Hematology and Oncology

## 2019-04-14 ENCOUNTER — Ambulatory Visit: Payer: Medicare HMO

## 2019-04-14 ENCOUNTER — Inpatient Hospital Stay (HOSPITAL_BASED_OUTPATIENT_CLINIC_OR_DEPARTMENT_OTHER): Payer: Medicare HMO | Admitting: Hematology and Oncology

## 2019-04-14 ENCOUNTER — Other Ambulatory Visit: Payer: Medicare HMO

## 2019-04-14 DIAGNOSIS — E538 Deficiency of other specified B group vitamins: Secondary | ICD-10-CM | POA: Diagnosis not present

## 2019-04-14 DIAGNOSIS — Z170 Estrogen receptor positive status [ER+]: Secondary | ICD-10-CM

## 2019-04-14 DIAGNOSIS — M810 Age-related osteoporosis without current pathological fracture: Secondary | ICD-10-CM | POA: Diagnosis not present

## 2019-04-14 DIAGNOSIS — C50411 Malignant neoplasm of upper-outer quadrant of right female breast: Secondary | ICD-10-CM

## 2019-04-29 ENCOUNTER — Telehealth: Payer: Self-pay

## 2019-04-29 NOTE — Telephone Encounter (Signed)
Spoke with the patient to inform her that the tier has been changed as of 04/26/2019 and she has been approved until 03/26/2020. If she can have the pharmacy re run her insurance. The patient was understanding and agreeable.

## 2019-05-17 ENCOUNTER — Encounter: Payer: Self-pay | Admitting: Emergency Medicine

## 2019-05-17 ENCOUNTER — Other Ambulatory Visit: Payer: Self-pay

## 2019-05-17 ENCOUNTER — Ambulatory Visit
Admission: EM | Admit: 2019-05-17 | Discharge: 2019-05-17 | Disposition: A | Discharge status: Home / Self Care | Payer: Medicare HMO | Attending: Family Medicine | Admitting: Family Medicine

## 2019-05-17 DIAGNOSIS — B029 Zoster without complications: Secondary | ICD-10-CM | POA: Diagnosis not present

## 2019-05-17 MED ORDER — VALACYCLOVIR HCL 1 G PO TABS: 1000.0000 mg | ORAL_TABLET | Freq: Three times a day (TID) | ORAL | 0 refills | Status: DC

## 2019-05-17 NOTE — ED Provider Notes (Signed)
[facility]    CSN: [id number] Arrival date & time: 05/17/19  [id number]      History   Chief Complaint Chief Complaint  Patient presents with  . Rash    HPI Allison Hall is a 79 y.o. female.   79 yo female with a 1 week h/o right leg pain and 3 days h/o a rash along the front of her thigh. Denies any injuries, fevers, chills. States rash is slightly itchy, burning and tingling.    Rash   Past Medical History:  Diagnosis Date  . Anxiety   . Breast cancer ([place]) 08/24/2016   right  . Cancer ([place])    skin ca  . Colon polyp   . Depression   . GERD (gastroesophageal reflux disease)   . Hypercholesteremia   . Hypertension   . Osteoarthritis   . Personal history of radiation therapy   . Thyroid nodule 04/17/2016    Patient Active Problem List   Diagnosis Date Noted  . Chronic pain of left knee 04/04/2018  . Chronic pain syndrome 04/04/2018  . Iron deficiency anemia 10/31/2017  . B12 deficiency 10/31/2017  . Arthralgia 06/03/2017  . Osteoporosis 10/04/2016  . Breast cancer of upper-outer quadrant of right female breast ([place]) 08/24/2016  . Closed fracture of distal end of ulna 06/01/2015  . Triggering of digit 05/27/2014  . Hamstring muscle strain 05/26/2014  . Arthritis of knee, degenerative 03/26/2014  . Long term current use of opiate analgesic 04/21/2013  . Arthritis, degenerative 04/21/2013  . Anxiety 10/05/2011  . Colon polyp 10/05/2011  . Esophageal foreign body 10/05/2011  . Acid reflux 10/05/2011  . BP (high blood pressure) 10/05/2011  . Hypercholesterolemia 10/05/2011    Past Surgical History:  Procedure Laterality Date  . ABDOMINAL HYSTERECTOMY    . APPENDECTOMY    . BREAST BIOPSY Right 08/24/2016   9:30 - invasive mammary carcinoma with lobular features  . BREAST BIOPSY Right 08/24/2016   10:00 - invasive mammary carcinoma with lobular features  . BREAST LUMPECTOMY Right 10/09/2016  . COLONOSCOPY W/ POLYPECTOMY  2003, 2008,  2014  . COLONOSCOPY WITH PROPOFOL N/A 12/03/2017   Procedure: COLONOSCOPY WITH PROPOFOL;  Surgeon: Manya Silvas, MD;  Location: [facility] ENDOSCOPY;  Service: Endoscopy;  Laterality: N/A;  . ESOPHAGOGASTRODUODENOSCOPY     foreign body  . ESOPHAGOGASTRODUODENOSCOPY (EGD) WITH PROPOFOL N/A 12/03/2017   Procedure: ESOPHAGOGASTRODUODENOSCOPY (EGD) WITH PROPOFOL;  Surgeon: Manya Silvas, MD;  Location: [facility] ENDOSCOPY;  Service: Endoscopy;  Laterality: N/A;  . JOINT REPLACEMENT Right 2008   TKR  . NOSE SURGERY    . PARTIAL MASTECTOMY WITH NEEDLE LOCALIZATION Right 09/29/2016   Procedure: PARTIAL MASTECTOMY WITH NEEDLE LOCALIZATION;  Surgeon: [name], MD;  Location: [facility] ORS;  Service: General;  Laterality: Right;  . REPLACEMENT TOTAL KNEE Right 2008  . SENTINEL NODE BIOPSY Right 09/29/2016   Procedure: SENTINEL NODE BIOPSY;  Surgeon: [name], MD;  Location: [facility] ORS;  Service: General;  Laterality: Right;    OB History   No obstetric history on file.      Home Medications    Prior to Admission medications   Medication Sig Start Date End Date Taking? Authorizing Provider  acetaminophen (TYLENOL) 500 MG tablet Take 650 mg by mouth every 6 (six) hours as needed.    Yes [provider]  amLODipine (NORVASC) 10 MG tablet TAKE 1 TABLET BY MOUTH EVERY DAY 09/10/18  Yes [provider]  buPROPion (WELLBUTRIN XL) 300 MG  24 hr tablet Take by mouth. 12/25/17  Yes [provider]  busPIRone (BUSPAR) 10 MG tablet Take 10 mg by mouth 3 (three) times daily. 04/17/16  Yes [provider]  calcium carbonate 1250 MG capsule Take 1,250 mg by mouth daily.    Yes [provider]  denosumab (PROLIA) 60 MG/ML SOLN injection Inject 60 mg into the skin every 6 (six) months. Administer in upper arm, thigh, or abdomen   Yes [provider]  exemestane (AROMASIN) 25 MG tablet Take 1 tablet (25 mg total) by mouth daily after breakfast. 10/07/18   Yes Corcoran, Melissa C, MD  lisinopril (ZESTRIL) 40 MG tablet Take 40 mg by mouth daily. 11/24/18  Yes [provider]  loperamide (IMODIUM) 2 MG capsule Take 2 mg by mouth as needed for diarrhea or loose stools.    Yes [provider]  Magnesium 250 MG TABS Take 500 mg by mouth daily.    Yes [provider]  Multiple Vitamins-Minerals (PRESERVISION AREDS PO) Take 1 capsule by mouth 2 (two) times daily.   Yes [provider]  omeprazole (PRILOSEC) 40 MG capsule Take 40 mg by mouth daily.  10/20/14 05/17/19 Yes [provider]  rosuvastatin (CRESTOR) 5 MG tablet Take 5 mg by mouth daily. 04/17/16 05/17/19 Yes [provider]  sertraline (ZOLOFT) 100 MG tablet Take 1.5 tablets (150 mg total) by mouth daily. Patient taking differently: Take 100 mg by mouth daily.  05/19/15 05/17/19 Yes Ravi, Himabindu, MD  Turmeric Curcumin 500 MG CAPS Take 1,500 mg by mouth daily.    Yes [provider]  vitamin B-12 (CYANOCOBALAMIN) 500 MCG tablet Take 1,000 mcg by mouth every other day.    Yes [provider]  Cholecalciferol (VITAMIN D3) 1000 units CAPS Take 2,000 Units by mouth 2 (two) times daily.     [provider]  Ferrous Sulfate Dried ([employer] SLOW RELEASE IRON) 45 MG TBCR Take 1 tablet by mouth daily. With Orange juice    [provider]  valACYclovir (VALTREX) 1000 MG tablet Take 1 tablet (1,000 mg total) by mouth 3 (three) times daily. 05/17/19   Norval Gable, MD    Family History Family History  Problem Relation Age of Onset  . Hypertension Mother   . Depression Mother   . Heart failure Father   . Obesity Brother   . Heart disease Brother   . Stroke Brother   . Depression Brother   . Breast cancer Cousin   . Breast cancer Cousin   . Breast cancer Cousin     Social History Social History   Tobacco Use  . Smoking status: Former Smoker    Packs/day: 1.00    Years: 10.00    Pack years: 10.00    Quit date:  05/18/1965    Years since quitting: 54.0  . Smokeless tobacco: Never Used  Substance Use Topics  . Alcohol use: Yes    Alcohol/week: 7.0 standard drinks    Types: 7 Glasses of wine per week  . Drug use: No     Allergies   Sulfamethoxazole, Other, Statins, Trazodone, Ciprofloxacin, and Sulfa antibiotics   Review of Systems Review of Systems  Skin: Positive for rash.     Physical Exam Triage Vital Signs ED Triage Vitals  Enc Vitals Group     BP 05/17/19 0950 117/82     Pulse Rate 05/17/19 0950 90     Resp 05/17/19 0950 18     Temp 05/17/19 0950 98.1  F (36.7 C)     Temp Source 05/17/19 0950 Oral     SpO2 05/17/19 0950 98 %     Weight 05/17/19 0947 208 lb (94.3 kg)     Height 05/17/19 0947 5\' 5"  (1.651 m)     Head Circumference --      Peak Flow --      Pain Score 05/17/19 0947 1     Pain Loc --      Pain Edu? --      Excl. in [place]? --    No data found.  Updated Vital Signs BP 117/82 (BP Location: Left Arm)   Pulse 90   Temp 98.1 F (36.7 C) (Oral)   Resp 18   Ht 5\' 5"  (1.651 m)   Wt 94.3 kg   SpO2 98%   BMI 34.61 kg/m   Visual Acuity Right Eye Distance:   Left Eye Distance:   Bilateral Distance:    Right Eye Near:   Left Eye Near:    Bilateral Near:     Physical Exam Vitals and nursing note reviewed.  Constitutional:      General: She is not in acute distress.    Appearance: She is not toxic-appearing or diaphoretic.  Skin:    Findings: Rash present. Rash is vesicular.          Comments: Vesicular, erythematous rash along dermatomal pattern along her thigh  Neurological:     Mental Status: She is alert.      UC Treatments / Results  Labs (all labs ordered are listed, but only abnormal results are displayed) Labs Reviewed - No data to display  EKG   Radiology No results found.  Procedures Procedures (including critical care time)  Medications Ordered in UC Medications - No data to display  Initial Impression / Assessment and  Plan / UC Course  I have reviewed the triage vital signs and the nursing notes.  Pertinent labs & imaging results that were available during my care of the patient were reviewed by me and considered in my medical decision making (see chart for details).      Final Clinical Impressions(s) / UC Diagnoses   Final diagnoses:  Herpes zoster without complication     Discharge Instructions     Over the counter tylenol and/or ibuprofen as needed    ED Prescriptions    Medication Sig Dispense Auth. Provider   valACYclovir (VALTREX) 1000 MG tablet Take 1 tablet (1,000 mg total) by mouth 3 (three) times daily. 21 tablet Norval Gable, MD      1. diagnosis reviewed with patient 2. rx as per orders above; reviewed possible side effects, interactions, risks and benefits  3. Recommend supportive treatment as above 4. Follow-up prn if symptoms worsen or don't improve   PDMP not reviewed this encounter.   Norval Gable, MD 05/17/19 1025

## 2019-05-17 NOTE — Discharge Instructions (Signed)
Over the counter tylenol and/or ibuprofen as needed

## 2019-05-17 NOTE — ED Triage Notes (Signed)
Pt c/o rash on her right thigh. She states that she started having pain in her leg about a week ago. She was seen by her orthopedist and had x rays but did not show anything new. Then about 3 days ago she developed a blister like rash in the area where the pain was. She states that the area itches burns, and tingles.

## 2019-05-31 ENCOUNTER — Encounter: Payer: Self-pay | Admitting: Emergency Medicine

## 2019-05-31 ENCOUNTER — Ambulatory Visit
Admission: EM | Admit: 2019-05-31 | Discharge: 2019-05-31 | Disposition: A | Discharge status: Home / Self Care | Payer: Medicare HMO | Attending: Family Medicine | Admitting: Family Medicine

## 2019-05-31 ENCOUNTER — Other Ambulatory Visit: Payer: Self-pay

## 2019-05-31 DIAGNOSIS — G933 Postviral fatigue syndrome: Secondary | ICD-10-CM | POA: Diagnosis present

## 2019-05-31 DIAGNOSIS — N390 Urinary tract infection, site not specified: Secondary | ICD-10-CM | POA: Diagnosis present

## 2019-05-31 DIAGNOSIS — G9331 Postviral fatigue syndrome: Secondary | ICD-10-CM

## 2019-05-31 LAB — URINALYSIS, COMPLETE (UACMP) WITH MICROSCOPIC
Bilirubin Urine: NEGATIVE
Glucose, UA: NEGATIVE mg/dL
Hgb urine dipstick: NEGATIVE
Ketones, ur: NEGATIVE mg/dL
Nitrite: NEGATIVE
Protein, ur: NEGATIVE mg/dL
RBC / HPF: NONE SEEN RBC/hpf (ref 0–5)
Specific Gravity, Urine: 1.015 (ref 1.005–1.030)
pH: 7.5 (ref 5.0–8.0)

## 2019-05-31 MED ORDER — CEPHALEXIN 500 MG PO CAPS: 500.0000 mg | ORAL_CAPSULE | Freq: Two times a day (BID) | ORAL | 0 refills | Status: AC

## 2019-05-31 NOTE — Discharge Instructions (Signed)
It was very nice seeing you today in clinic. Thank you for entrusting me with your care.   Suspect your fatigue is post-viral syndrome following recent Shingles. Make sure you are staying active to combat deconditioning from your baseline level.   Urine shows early infection. Will send culture. Antibiotics as prescribed. Increase fluid intake as much as possible, with water being the best.  Make arrangements to follow up with your regular doctor in 1 week for re-evaluation if not improving. If your symptoms/condition worsens, please seek follow up care either here or in the ER. Please remember, our [place] providers are "right here with you" when you need [place].   Again, it was my pleasure to take care of you today. Thank you for choosing our clinic. I hope that you start to feel better quickly.   Honor Loh, MSN, APRN, FNP-C, CEN Advanced Practice Provider [facility]

## 2019-05-31 NOTE — ED Triage Notes (Signed)
Patient states that she was diagnosed with Shingles on 05/17/19.  Patient states that she has had ongoing fatigue and tiredness since the started of her shingles.  Patient denies cold symptoms.  Patient denies fevers.  Patient denies urinary symptoms.

## 2019-06-01 NOTE — ED Provider Notes (Signed)
[place], [place]   Name: Allison Hall DOB: [birth year] MRN: [id number] CSN: [id number] PCP: Wardell Honour, MD  Arrival date and time:  05/31/19 [phone]  Chief Complaint:  Fatigue   NOTE: Prior to seeing the patient today, I have reviewed the triage nursing documentation and vital signs. Clinical staff has updated patient's PMH/PSHx, current medication list, and drug allergies/intolerances to ensure comprehensive history available to assist in medical decision making.   History:   HPI: Allison Hall is a 79 y.o. female who presents today with complaints of extreme fatigue for the last week. She has been having some minor pain in her lower back. Patient reports that the last time she felt similar she had a urinary tract infection. Patient denies urinary symptoms; no dysuria, frequency, or urgency. She is eating and drinking well. She denies that she has experienced any nausea, vomiting, diarrhea, or abdominal pain. She has not had any recent fever or chills. Patient was seen here on 05/17/19 by Dr. Zenda Alpers for painful rash to her RIGHT thigh; notes reviewed. Patient diagnosed with HZV and was subsequently treated with 7 day course of antiviral therapy (valacyclovir). Rash has resolved, however patient reports that she started feeling "really tired and run down" the following week. Patient has no other complaints today. She has not found anything that improves her symptoms and presents today for further evaluation. PMH (+) B12 and iron deficiency anemia. Recent labs from cancer center reviewed; CBC normal (03/2019), B12 normal (01/2019). She is on daily oral iron and B12 supplementation. Patient is on adjuvant aromatase inhibitor therapy (exemestane) for breast cancer.   Past Medical History:  Diagnosis Date  . Anxiety   . Breast cancer ([place]) 08/24/2016   right  . Cancer ([place])    skin ca  . Colon polyp   . Depression   . GERD (gastroesophageal reflux disease)   . Hypercholesteremia    . Hypertension   . Osteoarthritis   . Personal history of radiation therapy   . Thyroid nodule 04/17/2016    Past Surgical History:  Procedure Laterality Date  . ABDOMINAL HYSTERECTOMY    . APPENDECTOMY    . BREAST BIOPSY Right 08/24/2016   9:30 - invasive mammary carcinoma with lobular features  . BREAST BIOPSY Right 08/24/2016   10:00 - invasive mammary carcinoma with lobular features  . BREAST LUMPECTOMY Right 10/09/2016  . COLONOSCOPY W/ POLYPECTOMY  2003, 2008, 2014  . COLONOSCOPY WITH PROPOFOL N/A 12/03/2017   Procedure: COLONOSCOPY WITH PROPOFOL;  Surgeon: Manya Silvas, MD;  Location: [facility] ENDOSCOPY;  Service: Endoscopy;  Laterality: N/A;  . ESOPHAGOGASTRODUODENOSCOPY     foreign body  . ESOPHAGOGASTRODUODENOSCOPY (EGD) WITH PROPOFOL N/A 12/03/2017   Procedure: ESOPHAGOGASTRODUODENOSCOPY (EGD) WITH PROPOFOL;  Surgeon: Manya Silvas, MD;  Location: [facility] ENDOSCOPY;  Service: Endoscopy;  Laterality: N/A;  . JOINT REPLACEMENT Right 2008   TKR  . NOSE SURGERY    . PARTIAL MASTECTOMY WITH NEEDLE LOCALIZATION Right 09/29/2016   Procedure: PARTIAL MASTECTOMY WITH NEEDLE LOCALIZATION;  Surgeon: [name], MD;  Location: [facility] ORS;  Service: General;  Laterality: Right;  . REPLACEMENT TOTAL KNEE Right 2008  . SENTINEL NODE BIOPSY Right 09/29/2016   Procedure: SENTINEL NODE BIOPSY;  Surgeon: [name], MD;  Location: [facility] ORS;  Service: General;  Laterality: Right;    Family History  Problem Relation Age of Onset  . Hypertension Mother   . Depression Mother   . Heart failure Father   . Obesity Brother   .  Heart disease Brother   . Stroke Brother   . Depression Brother   . Breast cancer Cousin   . Breast cancer Cousin   . Breast cancer Cousin     Social History   Tobacco Use  . Smoking status: Former Smoker    Packs/day: 1.00    Years: 10.00    Pack years: 10.00    Quit date: 05/18/1965    Years since quitting: 54.0  . Smokeless tobacco: Never  Used  Substance Use Topics  . Alcohol use: Yes    Alcohol/week: 7.0 standard drinks    Types: 7 Glasses of wine per week  . Drug use: No    Patient Active Problem List   Diagnosis Date Noted  . Chronic pain of left knee 04/04/2018  . Chronic pain syndrome 04/04/2018  . Iron deficiency anemia 10/31/2017  . B12 deficiency 10/31/2017  . Arthralgia 06/03/2017  . Osteoporosis 10/04/2016  . Breast cancer of upper-outer quadrant of right female breast ([place]) 08/24/2016  . Closed fracture of distal end of ulna 06/01/2015  . Triggering of digit 05/27/2014  . Hamstring muscle strain 05/26/2014  . Arthritis of knee, degenerative 03/26/2014  . Long term current use of opiate analgesic 04/21/2013  . Arthritis, degenerative 04/21/2013  . Anxiety 10/05/2011  . Colon polyp 10/05/2011  . Esophageal foreign body 10/05/2011  . Acid reflux 10/05/2011  . BP (high blood pressure) 10/05/2011  . Hypercholesterolemia 10/05/2011    Home Medications:    Current Meds  Medication Sig  . amLODipine (NORVASC) 10 MG tablet TAKE 1 TABLET BY MOUTH EVERY DAY  . buPROPion (WELLBUTRIN XL) 300 MG 24 hr tablet Take by mouth.  . busPIRone (BUSPAR) 10 MG tablet Take 10 mg by mouth 3 (three) times daily.  . calcium carbonate 1250 MG capsule Take 1,250 mg by mouth daily.   . Cholecalciferol (VITAMIN D3) 1000 units CAPS Take 2,000 Units by mouth 2 (two) times daily.   Marland Kitchen denosumab (PROLIA) 60 MG/ML SOLN injection Inject 60 mg into the skin every 6 (six) months. Administer in upper arm, thigh, or abdomen  . exemestane (AROMASIN) 25 MG tablet Take 1 tablet (25 mg total) by mouth daily after breakfast.  . Ferrous Sulfate Dried ([employer] SLOW RELEASE IRON) 45 MG TBCR Take 1 tablet by mouth daily. With Orange juice  . lisinopril (ZESTRIL) 40 MG tablet Take 40 mg by mouth daily.  Marland Kitchen loperamide (IMODIUM) 2 MG capsule Take 2 mg by mouth as needed for diarrhea or loose stools.   . Multiple Vitamins-Minerals (PRESERVISION AREDS PO)  Take 1 capsule by mouth 2 (two) times daily.  Marland Kitchen omeprazole (PRILOSEC) 40 MG capsule Take 40 mg by mouth daily.   . rosuvastatin (CRESTOR) 5 MG tablet Take 5 mg by mouth daily.  . sertraline (ZOLOFT) 100 MG tablet Take 1.5 tablets (150 mg total) by mouth daily. (Patient taking differently: Take 100 mg by mouth daily. )  . Turmeric Curcumin 500 MG CAPS Take 1,500 mg by mouth daily.   . valACYclovir (VALTREX) 1000 MG tablet Take 1 tablet (1,000 mg total) by mouth 3 (three) times daily.  . vitamin B-12 (CYANOCOBALAMIN) 500 MCG tablet Take 1,000 mcg by mouth every other day.     Allergies:   Sulfamethoxazole, Other, Statins, Trazodone, Ciprofloxacin, and Sulfa antibiotics  Review of Systems (ROS):  Review of systems NEGATIVE unless otherwise noted in narrative H&P section.   Vital Signs: Today's Vitals   05/31/19 1004 05/31/19 1008 05/31/19 1054  BP:  125/88   Pulse:  89   Resp:  14   Temp:  98.5 F (36.9 C)   TempSrc:  Oral   SpO2:  97%   Weight: 200 lb (90.7 kg)    Height: 5\' 5"  (1.651 m)    PainSc: 0-No pain  0-No pain    Physical Exam: Physical Exam  Constitutional: She is oriented to person, place, and time and well-developed, well-nourished, and in no distress.  HENT:  Head: Normocephalic and atraumatic.  Eyes: Pupils are equal, round, and reactive to light.  Cardiovascular: Normal rate, regular rhythm, normal heart sounds and intact distal pulses.  Pulmonary/Chest: Effort normal and breath sounds normal.  Abdominal: Soft. Normal appearance and bowel sounds are normal. She exhibits no distension. There is no abdominal tenderness. There is no CVA tenderness.  Musculoskeletal:     Cervical back: Normal range of motion and neck supple.  Lymphadenopathy:    She has no cervical adenopathy.  Neurological: She is alert and oriented to person, place, and time. She has normal sensation, normal strength and normal reflexes. Gait normal.  Skin: Skin is warm and dry. Rash noted. She  is not diaphoretic.  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 Dry with no residual vesicles noted.   Psychiatric: Mood, memory, affect and judgment normal.  Nursing note and vitals reviewed.   Urgent Care Treatments / Results:   Orders Placed This Encounter  Procedures  . Urine culture  . Urinalysis, Complete w Microscopic    LABS: PLEASE NOTE: all labs that were ordered this encounter are listed, however only abnormal results are displayed. Labs Reviewed  URINALYSIS, COMPLETE (UACMP) WITH MICROSCOPIC - Abnormal; Notable for the following components:      Result Value   Leukocytes,Ua TRACE (*)    Bacteria, UA RARE (*)    All other components within normal limits  URINE CULTURE    EKG: -None  RADIOLOGY: No results found.  PROCEDURES: Procedures  MEDICATIONS RECEIVED THIS VISIT: Medications - No data to display  PERTINENT CLINICAL COURSE NOTES/UPDATES:   Initial Impression / Assessment and Plan / Urgent Care Course:  Pertinent labs & imaging results that were available during my care of the patient were personally reviewed by me and considered in my medical decision making (see lab/imaging section of note for values and interpretations).  Allison Hall is a 79 y.o. female who presents to [facility] today with complaints of Fatigue  Patient is well appearing overall in clinic today. She does not appear to be in any acute distress. Presenting symptoms (see HPI) and exam as documented above. Exam revealed no acute findings today. Fatigue following HSV infection. She has never been tested for SARS-CoV-2 (novel coronavirus) and does not wish to be tested today. Recent labs from 04/10/19 reviewed. CBC normal. Creatinine chronically elevated and remained within baseline range at 1.44 mg/dL. Patient is eating and drinking normally. No recent nausea, vomiting, or diarrhea. Will defer further blood labs today. Will check UA to assess for underlying urinary tract  infection.    Volume of provided urine sample very limited. Sample (+) for LE and bacteria. Will attempt to send for culture, however provided volume was < 10 mL. Based on symptoms and history of urinary tract infections that cause significant symptoms in this patient, will empirically cover with a 5 day course of cephalexin. Patient encouraged to complete the entire course of antibiotics even if she begins to feel better. She was advised that if culture demonstrates resistance  to the prescribed antibiotic, she will be contacted and advised of the need to change the antibiotic being used to treat her infection. Patient encouraged to increase her fluid intake as much as possible. Discussed that water is always best to flush the urinary tract. She was advised to avoid caffeine containing fluids until her infections clears, as caffeine can cause her to experience painful bladder spasms. May use Tylenol as needed for pain/fever.    Suspect that fatigue is related to early UTI and post-viral syndrome following HSV infection. Patient encouraged to remain active in efforts to prevent deconditioning. Discussed that this will allow her to hopefully return to her PLOF more quickly. If not improving, patient will need to follow up with PCP for further evaluation and lab studies; consider TSH and vitamin D levels.   Discussed follow up with primary care physician in 1 week for re-evaluation. I have reviewed the follow up and strict return precautions for any new or worsening symptoms. Patient is aware of symptoms that would be deemed urgent/emergent, and would thus require further evaluation either here or in the emergency department. At the time of discharge, she verbalized understanding and consent with the discharge plan as it was reviewed with her. All questions were fielded by provider and/or clinic staff prior to patient discharge.    Final Clinical Impressions / Urgent Care Diagnoses:   Final diagnoses:   Urinary tract infection without hematuria, site unspecified  Postviral fatigue syndrome    New Prescriptions:  [place] Controlled Substance Registry consulted? Not Applicable  Meds ordered this encounter  Medications  . cephALEXin (KEFLEX) 500 MG capsule    Sig: Take 1 capsule (500 mg total) by mouth 2 (two) times daily for 5 days.    Dispense:  10 capsule    Refill:  0    Recommended Follow up Care:  Patient encouraged to follow up with the following provider within the specified time frame, or sooner as dictated by the severity of her symptoms. As always, she was instructed that for any urgent/emergent care needs, she should seek care either here or in the emergency department for more immediate evaluation.  Follow-up Information    Wardell Honour, MD In 1 week.   Specialty: Family Medicine Why: General reassessment of symptoms if not improving Contact information: [address] [id number] [phone]         NOTE: This note was prepared using Dragon dictation software along with smaller phrase technology. Despite my best ability to proofread, there is the potential that transcriptional errors may still occur from this process, and are completely unintentional.     Karen Kitchens, NP 06/01/19 2314

## 2019-06-02 LAB — URINE CULTURE

## 2019-06-10 ENCOUNTER — Encounter: Payer: Self-pay | Admitting: Emergency Medicine

## 2019-06-10 ENCOUNTER — Other Ambulatory Visit: Payer: Self-pay

## 2019-06-10 ENCOUNTER — Ambulatory Visit (INDEPENDENT_AMBULATORY_CARE_PROVIDER_SITE_OTHER): Payer: Medicare HMO

## 2019-06-10 ENCOUNTER — Ambulatory Visit
Admission: EM | Admit: 2019-06-10 | Discharge: 2019-06-10 | Disposition: A | Discharge status: Home / Self Care | Payer: Medicare HMO | Attending: Urgent Care | Admitting: Urgent Care

## 2019-06-10 DIAGNOSIS — M79672 Pain in left foot: Secondary | ICD-10-CM

## 2019-06-10 NOTE — ED Triage Notes (Signed)
Pt c/o left foot pain. She states a can of deodorizer fell out of her cabinet on to her foot about noon today.

## 2019-09-03 ENCOUNTER — Ambulatory Visit
Admission: RE | Admit: 2019-09-03 | Discharge: 2019-09-03 | Disposition: A | Discharge status: Home / Self Care | Payer: Medicare HMO | Source: Ambulatory Visit | Attending: Hematology and Oncology | Admitting: Hematology and Oncology

## 2019-09-03 DIAGNOSIS — C50411 Malignant neoplasm of upper-outer quadrant of right female breast: Secondary | ICD-10-CM | POA: Insufficient documentation

## 2019-09-03 DIAGNOSIS — Z170 Estrogen receptor positive status [ER+]: Secondary | ICD-10-CM | POA: Insufficient documentation

## 2019-10-06 ENCOUNTER — Other Ambulatory Visit: Payer: Self-pay

## 2019-10-06 ENCOUNTER — Ambulatory Visit
Admission: RE | Admit: 2019-10-06 | Discharge: 2019-10-06 | Disposition: A | Discharge status: Home / Self Care | Payer: Medicare HMO | Source: Ambulatory Visit | Attending: Hematology and Oncology | Admitting: Hematology and Oncology

## 2019-10-06 DIAGNOSIS — M810 Age-related osteoporosis without current pathological fracture: Secondary | ICD-10-CM | POA: Insufficient documentation

## 2019-10-09 ENCOUNTER — Inpatient Hospital Stay: Payer: Medicare HMO

## 2019-10-09 ENCOUNTER — Other Ambulatory Visit: Payer: Self-pay

## 2019-10-09 ENCOUNTER — Inpatient Hospital Stay: Payer: Medicare HMO | Attending: Nurse Practitioner

## 2019-10-09 ENCOUNTER — Encounter: Payer: Self-pay | Admitting: Nurse Practitioner

## 2019-10-09 ENCOUNTER — Inpatient Hospital Stay (HOSPITAL_BASED_OUTPATIENT_CLINIC_OR_DEPARTMENT_OTHER): Payer: Medicare HMO | Admitting: Nurse Practitioner

## 2019-10-09 VITALS — BP 130/67 | HR 78 | Temp 97.9°F | Wt 195.1 lb

## 2019-10-09 DIAGNOSIS — E538 Deficiency of other specified B group vitamins: Secondary | ICD-10-CM

## 2019-10-09 DIAGNOSIS — M818 Other osteoporosis without current pathological fracture: Secondary | ICD-10-CM | POA: Insufficient documentation

## 2019-10-09 DIAGNOSIS — Z170 Estrogen receptor positive status [ER+]: Secondary | ICD-10-CM | POA: Insufficient documentation

## 2019-10-09 DIAGNOSIS — M810 Age-related osteoporosis without current pathological fracture: Secondary | ICD-10-CM

## 2019-10-09 DIAGNOSIS — C50411 Malignant neoplasm of upper-outer quadrant of right female breast: Secondary | ICD-10-CM | POA: Diagnosis not present

## 2019-10-09 LAB — CBC WITH DIFFERENTIAL/PLATELET
Abs Immature Granulocytes: 0.03 10*3/uL (ref 0.00–0.07)
Basophils Absolute: 0.1 10*3/uL (ref 0.0–0.1)
Basophils Relative: 1 %
Eosinophils Absolute: 0.2 10*3/uL (ref 0.0–0.5)
Eosinophils Relative: 3 %
HCT: 38.2 % (ref 36.0–46.0)
Hemoglobin: 12.8 g/dL (ref 12.0–15.0)
Immature Granulocytes: 1 %
Lymphocytes Relative: 28 %
Lymphs Abs: 1.5 10*3/uL (ref 0.7–4.0)
MCH: 31.7 pg (ref 26.0–34.0)
MCHC: 33.5 g/dL (ref 30.0–36.0)
MCV: 94.6 fL (ref 80.0–100.0)
Monocytes Absolute: 0.6 10*3/uL (ref 0.1–1.0)
Monocytes Relative: 11 %
Neutro Abs: 3.1 10*3/uL (ref 1.7–7.7)
Neutrophils Relative %: 56 %
Platelets: 192 10*3/uL (ref 150–400)
RBC: 4.04 MIL/uL (ref 3.87–5.11)
RDW: 13.2 % (ref 11.5–15.5)
WBC: 5.4 10*3/uL (ref 4.0–10.5)
nRBC: 0 % (ref 0.0–0.2)

## 2019-10-09 LAB — FOLATE: Folate: 12.7 ng/mL (ref >5.9)

## 2019-10-09 LAB — COMPREHENSIVE METABOLIC PANEL
ALT: 23 U/L (ref 0–44)
AST: 22 U/L (ref 15–41)
Albumin: 4.5 g/dL (ref 3.5–5.0)
Alkaline Phosphatase: 48 U/L (ref 38–126)
Anion gap: 8 (ref 5–15)
BUN: 25 mg/dL — ABNORMAL HIGH (ref 8–23)
CO2: 27 mmol/L (ref 22–32)
Calcium: 9.3 mg/dL (ref 8.9–10.3)
Chloride: 103 mmol/L (ref 98–111)
Creatinine, Ser: 1.25 mg/dL — ABNORMAL HIGH (ref 0.44–1.00)
GFR calc Af Amer: 47 mL/min — ABNORMAL LOW (ref >60)
GFR calc non Af Amer: 41 mL/min — ABNORMAL LOW (ref >60)
Glucose, Bld: 93 mg/dL (ref 70–99)
Potassium: 4.1 mmol/L (ref 3.5–5.1)
Sodium: 138 mmol/L (ref 135–145)
Total Bilirubin: 0.5 mg/dL (ref 0.3–1.2)
Total Protein: 7.3 g/dL (ref 6.5–8.1)

## 2019-10-09 MED ORDER — DENOSUMAB 60 MG/ML ~~LOC~~ SOSY
60.0000 mg | PREFILLED_SYRINGE | Freq: Once | SUBCUTANEOUS | Status: AC
  Administered 2019-10-09: 60 mg via SUBCUTANEOUS

## 2019-10-09 NOTE — Progress Notes (Signed)
[facility]  [address] [place], [place] [address] Phone: [phone]  Fax: [phone]  Telephone Visit:  10/09/2019  Referring physician: Clarisse Gouge, MD  Chief Complaint: Allison Hall is a 80 y.o. female with multi-focal right breast cancer, osteoporosis, iron deficiency anemia,and B12 deficiency who is seen for 2 month assessment   HPI: Patient last seen by Dr. Mike Gip virtually 03/28/19.   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 Also positive for UTI and treated with cephalexin.   Today, she says that she feels well and denies specific complaints. Fatigue is improved. She has been cleaning out her house as her son prepares to move it. She performs self breast exams and denies any new lumps or bumps. Continues Aromasin without significant side effects. Continues to have left knee pain and received injection which improved pain. She takes B12 1000 mcg every other day. She takes supplemental calcium and vitamin D. Denies dental issues. No recent falls or fractures. In the interim, she had bone density scan and mammogram.   Past Medical History:  Diagnosis Date  . Anxiety   . Breast cancer ([place]) 08/24/2016   right breast UOQ  . Cancer ([place])    skin ca  . Colon polyp   . Depression   . GERD (gastroesophageal reflux disease)   . Hypercholesteremia   . Hypertension   . Osteoarthritis   . Personal history of radiation therapy 2018   RIGHT BREAST CA UOQ  . Thyroid nodule 04/17/2016    Past Surgical History:  Procedure Laterality Date  . ABDOMINAL HYSTERECTOMY    . APPENDECTOMY    . BREAST BIOPSY Right 08/24/2016   9:30 - invasive mammary carcinoma with lobular features  . BREAST BIOPSY Right 08/24/2016   10:00 - invasive mammary carcinoma with lobular features  . BREAST LUMPECTOMY Right 10/09/2016    INVASIVE CARCINOMA, TUBULO-LOBULAR VARIANT, CLEAR MARGINS, NEGATIVE LN'S  . COLONOSCOPY W/ POLYPECTOMY  2003, 2008, 2014  . COLONOSCOPY WITH PROPOFOL N/A 12/03/2017   Procedure: COLONOSCOPY WITH PROPOFOL;  Surgeon: Manya Silvas, MD;  Location: [facility] ENDOSCOPY;  Service: Endoscopy;  Laterality: N/A;  . ESOPHAGOGASTRODUODENOSCOPY     foreign body  . ESOPHAGOGASTRODUODENOSCOPY (EGD) WITH PROPOFOL N/A 12/03/2017   Procedure: ESOPHAGOGASTRODUODENOSCOPY (EGD) WITH PROPOFOL;  Surgeon: Manya Silvas, MD;  Location: [facility] ENDOSCOPY;  Service: Endoscopy;  Laterality: N/A;  . JOINT REPLACEMENT Right 2008   TKR  . NOSE SURGERY    . PARTIAL MASTECTOMY WITH NEEDLE LOCALIZATION Right 09/29/2016   Procedure: PARTIAL MASTECTOMY WITH NEEDLE LOCALIZATION;  Surgeon: [name], MD;  Location: [facility] ORS;  Service: General;  Laterality: Right;  . REPLACEMENT TOTAL KNEE Right 2008  . SENTINEL NODE BIOPSY Right 09/29/2016   Procedure: SENTINEL NODE BIOPSY;  Surgeon: [name], MD;  Location: [facility] ORS;  Service: General;  Laterality: Right;    Family History  Problem Relation Age of Onset  . Hypertension Mother   . Depression Mother   . Heart failure Father   . Obesity Brother   . Heart disease Brother   . Stroke Brother   . Depression Brother   . Breast cancer Cousin   . Breast cancer Cousin   . Breast cancer Cousin     Social History:  reports that she quit smoking about 54 years ago. She has a 10.00  pack-year smoking history. She has never used smokeless tobacco. She reports current alcohol use of about 7.0 standard drinks of alcohol per week. She reports that she does not use drugs. She previously smoked 1/2 pack/day x 10 years. She stopped smoking on 10/11/1978. She drinks 6 glasses of wine/week. She has 3 children (2 sons and 1 daughter). She is a retired Clinical cytogeneticist for [employer]. She likes to do crossword puzzles. Her daughter's name is [name]. Her husband is in an assisted  living facility [facility]) secondary to dementia. She lives in [place] The patient is alone today.    Allergies:  Allergies  Allergen Reactions  . Sulfamethoxazole Other (See Comments)    Anxiety and imsomnia  . Other Itching    Vit C-vit E-copper-zinc-lutein - itching and fatigue (per [facility])  . Statins Other (See Comments)    Muscle pain   . Trazodone Other (See Comments)    Pt states she felt "unsteady"   . Ciprofloxacin Anxiety  . Sulfa Antibiotics Anxiety    Current Medications: Current Outpatient Medications  Medication Sig Dispense Refill  . acetaminophen (TYLENOL) 500 MG tablet Take 650 mg by mouth every 6 (six) hours as needed.     Marland Kitchen amLODipine (NORVASC) 10 MG tablet TAKE 1 TABLET BY MOUTH EVERY DAY    . buPROPion (WELLBUTRIN XL) 300 MG 24 hr tablet Take by mouth.    . busPIRone (BUSPAR) 10 MG tablet Take 10 mg by mouth 3 (three) times daily.    . calcium carbonate 1250 MG capsule Take 1,250 mg by mouth daily.     . Cholecalciferol (VITAMIN D3) 1000 units CAPS Take 2,000 Units by mouth 2 (two) times daily.     Marland Kitchen denosumab (PROLIA) 60 MG/ML SOLN injection Inject 60 mg into the skin every 6 (six) months. Administer in upper arm, thigh, or abdomen    . exemestane (AROMASIN) 25 MG tablet Take 1 tablet (25 mg total) by mouth daily after breakfast. 90 tablet 3  . lisinopril (ZESTRIL) 40 MG tablet Take 40 mg by mouth daily.    Marland Kitchen loperamide (IMODIUM) 2 MG capsule Take 2 mg by mouth as needed for diarrhea or loose stools.     . Magnesium 250 MG TABS Take 500 mg by mouth daily.     . Multiple Vitamins-Minerals (PRESERVISION AREDS PO) Take 1 capsule by mouth 2 (two) times daily.    Marland Kitchen omeprazole (PRILOSEC) 40 MG capsule Take 40 mg by mouth daily.     . rosuvastatin (CRESTOR) 5 MG tablet Take 5 mg by mouth daily.    . sertraline (ZOLOFT) 100 MG tablet Take 1.5 tablets (150 mg total) by mouth daily. (Patient taking differently: Take 100 mg by mouth daily. ) 45 tablet 1  . Turmeric  Curcumin 500 MG CAPS Take 1,500 mg by mouth daily.     . vitamin B-12 (CYANOCOBALAMIN) 500 MCG tablet Take 1,000 mcg by mouth every other day.      No current facility-administered medications for this visit.    Review of Systems  Constitutional: Negative for chills, fever, malaise/fatigue and weight loss.  HENT: Negative for hearing loss, nosebleeds, sore throat and tinnitus.   Eyes: Negative for blurred vision and double vision.  Respiratory: Negative for cough, hemoptysis, shortness of breath and wheezing.   Cardiovascular: Negative for chest pain, palpitations and leg swelling.  Gastrointestinal: Negative for abdominal pain, blood in stool, constipation, diarrhea, melena, nausea and vomiting.  Genitourinary: Negative for dysuria and urgency.  Musculoskeletal: Positive  for joint pain (left knee pain improved). Negative for back pain, falls and myalgias.  Skin: Negative for itching and rash.  Neurological: Negative for dizziness, tingling, sensory change, loss of consciousness, weakness and headaches.  Endo/Heme/Allergies: Negative for environmental allergies. Does not bruise/bleed easily.  Psychiatric/Behavioral: Negative for depression. The patient is not nervous/anxious and does not have insomnia.    Performance status (ECOG): 1  Vitals Blood pressure 130/67, pulse 78, temperature 97.9 F (36.6 C), temperature source Tympanic, weight 195 lb 1.7 oz (88.5 kg), SpO2 98 %.   Physical Exam Nursing note reviewed.  Constitutional:      Appearance: She is not ill-appearing.  HENT:     Head: Normocephalic.  Eyes:     General: No scleral icterus.    Conjunctiva/sclera: Conjunctivae normal.  Cardiovascular:     Rate and Rhythm: Normal rate and regular rhythm.     Pulses: Normal pulses.  Pulmonary:     Effort: Pulmonary effort is normal. No respiratory distress.     Breath sounds: Normal breath sounds.  Abdominal:     General: There is no distension.     Tenderness: There is no  abdominal tenderness. There is no guarding.  Musculoskeletal:        General: No swelling or deformity.     Cervical back: Neck supple.  Lymphadenopathy:     Cervical: No cervical adenopathy.  Skin:    General: Skin is warm and dry.     Coloration: Skin is not pale.     Findings: No rash.  Neurological:     Mental Status: She is alert and oriented to person, place, and time.  Psychiatric:        Behavior: Behavior normal.        Thought Content: Thought content normal.        Judgment: Judgment normal.     Appointment on 10/09/2019  Component Date Value Ref Range Status  . WBC 10/09/2019 5.4  4.0 - 10.5 K/uL Final  . RBC 10/09/2019 4.04  3.87 - 5.11 MIL/uL Final  . Hemoglobin 10/09/2019 12.8  12.0 - 15.0 g/dL Final  . HCT 10/09/2019 38.2  36 - 46 % Final  . MCV 10/09/2019 94.6  80.0 - 100.0 fL Final  . MCH 10/09/2019 31.7  26.0 - 34.0 pg Final  . MCHC 10/09/2019 33.5  30.0 - 36.0 g/dL Final  . RDW 10/09/2019 13.2  11.5 - 15.5 % Final  . Platelets 10/09/2019 192  150 - 400 K/uL Final  . nRBC 10/09/2019 0.0  0.0 - 0.2 % Final  . Neutrophils Relative % 10/09/2019 56  % Final  . Neutro Abs 10/09/2019 3.1  1.7 - 7.7 K/uL Final  . Lymphocytes Relative 10/09/2019 28  % Final  . Lymphs Abs 10/09/2019 1.5  0.7 - 4.0 K/uL Final  . Monocytes Relative 10/09/2019 11  % Final  . Monocytes Absolute 10/09/2019 0.6  0 - 1 K/uL Final  . Eosinophils Relative 10/09/2019 3  % Final  . Eosinophils Absolute 10/09/2019 0.2  0 - 0 K/uL Final  . Basophils Relative 10/09/2019 1  % Final  . Basophils Absolute 10/09/2019 0.1  0 - 0 K/uL Final  . Immature Granulocytes 10/09/2019 1  % Final  . Abs Immature Granulocytes 10/09/2019 0.03  0.00 - 0.07 K/uL Final   Performed at [facility], [address] [place], [place] [address]    Assessment:  Allison Hall is a 79 y.o. female withmulti-focalright breast cancers/p partial  mastectomy with sentinel lymph node biopsy on  09/29/2016. 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 Three sentinel lymph nodes were negative. Tumor was ER + (>90%), PR + (>90%) and Her2/neu 2+ (negative by FISH). Pathologic stage was T1c(m) N0(sn). CA27.29was 13.4 on 09/06/2016.  MammaPrintrevealed a 29% risk of recurrence (high). She was seen for second opinion at [facility] on 10/25/2016. Chemotherapy was not recommended.  Bilateral screening mammogramon 07/24/2016 revealed a possible area of distortion in the right breast. Diagnostic mammogram on 08/10/2016 revealed 2 suspicious masses in the upper-outer quadrant of the right breast in a broad area of confluent distortion in the upper-outer quadrant of the right breast. There are at least 3 areas of confluent distortion spanning approximately 6.5 cm of tissue. Ultrasound on 07/31/2016 revealed an irregular hypoechoic 1.2 x 1.1 x 1.2 cm at the 9:30 position 7 cm from the nipple. In addition, there was an irregular 1.9 x 0.8 x 1.2 cm mass at the 10 o'clock position 5 cm from the nipple. There were multiple small normal-appearing lymph nodes.   Breast MRIon 09/16/2016 revealed an irregular 1.6 x 1.2 x 1.6 cm enhancing mass adjacent to the clip artifact within the upper, outer right breast, middle depth, corresponding to the mass seen at 10 o'clock. There was an additional irregular enhancing mass adjacent to the more posterior clip artifact measuring 2.6 x 1.4 x 1.6 cm, corresponding to the mass seen at 9:30 position. Together, these 2 masses span 6.3 cm of breast tissue from anterior to posterior. There was no definite additional suspicious area of enhancement within the right breast to correspond to the third area of distortion identified on diagnostic mammogram dated 08/10/2016. Left breast revealed no suspicious mass or enhancement.  Bilateraldiagnostic mammogramon 09/02/2018  revealed no evidence of malignancy in either breast, s/p right lumpectomy.   She received radiationfrom 11/15/2016 - 01/08/2017. She was Femara(01/17/2017 - 05/16/2017) secondary to arthralgias. She discontinued Arimidexafter 19 days (last 06/14/2017) secondary to fatigue and polyarthralgias. She beganAromasinon 06/27/2017.  CA27.29 has been followed: 13.4 on 09/06/2016, 13.0 on 05/16/2017, 17.1 on 08/29/2017, 12.1 on 06/04/2018, 13.8 on 10/01/2018, 15.6 on 02/04/2019, and 18.3 on 04/10/2019.  Bone density studyon 09/30/2015 revealed osteopeniawith a T-score of -2.1 in the right femoral neck. Bone density studyrevealed osteoporosison 07/09/2019witha T-score of -3.1. She began Proliaon 02/14/2017 (last 04/10/2019).  She was diagnosed with iron deficiency anemiaand B12 deficiencyon 08/29/2017. TSH and folate were normal. She is on oral B12 500 mcg a day. Intrinsic factor and anti-parietal cell antibodies were normal on 01/02/2018. B12 was 888 on 01/29/2018 and 544 on 02/04/2019.  Folate was 8.4 on 08/29/2017.   She received Venoferweekly x 3 (10/31/2017 - 11/13/2017)and x 4 (06/05/2018 - 06/26/2018).  Ferritinhas been followed: 10 on 08/29/2017, 174 on 12/12/2017, 74 on 01/02/2018, 64 on 01/29/2018, 3 on 06/04/2018, 205 on 10/01/2018, and 162 on 02/04/2019.  EGDand colonoscopyon 12/03/2017. Polyps were noted. Pathology was negative for dysplasia and malignancy.  Bilateral diagnostic mammogram on 09/02/2018 revealed no evidence of malignancy in either breast, s/p right lumpectomy.  Symptomatically, she is doing well.  She voices no concerns.  Plan: 1.   Review labs from 04/10/2019. 2.MultifocalRIGHTbreast cancer  Mammogram on 09/03/2019 reported as BI-RADS Category 2 benign  Clinically doing well with no evidence of recurrent disease  Repeat bilateral diagnostic mammogram in 1 year  Continue self breast exams  Continue Aromasin 3.Iron deficiency  anemia  Hemoglobin 12.8  hematocrit 38.2 MCV 94.6  Ferritin pending  Off oral iron  Consider capsule study ferritin drops to < 100  Continue to monitor  4.Osteoporosis  T score -3.1 consistent with osteoporosis and July 2019  On Prolia since November 2018  On calcium 1200 mg and vitamin D 800 IU daily  Tolerating Prolia well without significant side effects or dental concerns  Bone density scan on 10/06/2019 reported T score of -2.8 consistent with osteoporosis though improved from previous.   Plan to repeat bone density scan in 2 years (around July 2023) 5.B12 deficiency B12 was544 on 02/04/2019. Patient decreased B12 from 1000 mcg/day to 500 mcg/day. B12 goal is >= 400. B12 level was 544 on 02/04/2019. Repeat b12 level in November 2021. Folate pending today.   Disposition:  Prolia today. RTC in 6 months for labs (cbc, cmp, ca 27.29, b12), MD, and prolia.   I discussed the assessment and treatment plan with the patient.  The patient was provided an opportunity to ask questions and all were answered.  The patient agreed with the plan and demonstrated an understanding of the instructions.  The patient was advised to call back if the symptoms worsen or if the condition fails to improve as anticipated.  Beckey Rutter, DNP, AGNP-C [place] at [facility] [phone] (clinic)

## 2019-10-09 NOTE — Progress Notes (Signed)
No new changes noted today 

## 2019-10-10 ENCOUNTER — Other Ambulatory Visit: Payer: Medicare HMO

## 2019-10-10 ENCOUNTER — Ambulatory Visit: Payer: Medicare HMO | Admitting: Hematology and Oncology

## 2019-10-10 ENCOUNTER — Ambulatory Visit: Payer: Medicare HMO

## 2019-10-10 LAB — CANCER ANTIGEN 27.29: CA 27.29: 10.2 U/mL (ref 0.0–38.6)

## 2019-10-13 ENCOUNTER — Ambulatory Visit: Payer: Medicare HMO

## 2019-10-13 ENCOUNTER — Ambulatory Visit: Payer: Medicare HMO | Admitting: Hematology and Oncology

## 2019-10-13 ENCOUNTER — Other Ambulatory Visit: Payer: Medicare HMO

## 2019-10-26 ENCOUNTER — Other Ambulatory Visit: Payer: Self-pay | Admitting: Hematology and Oncology

## 2020-01-22 ENCOUNTER — Ambulatory Visit
Admission: RE | Admit: 2020-01-22 | Discharge: 2020-01-22 | Disposition: A | Discharge status: Home / Self Care | Payer: Medicare HMO | Source: Ambulatory Visit | Attending: Radiation Oncology | Admitting: Radiation Oncology

## 2020-01-22 ENCOUNTER — Other Ambulatory Visit: Payer: Self-pay

## 2020-01-22 ENCOUNTER — Encounter: Payer: Self-pay | Admitting: Radiation Oncology

## 2020-01-22 VITALS — BP 152/81 | HR 80 | Temp 98.2°F | Wt 208.0 lb

## 2020-01-22 DIAGNOSIS — Z79811 Long term (current) use of aromatase inhibitors: Secondary | ICD-10-CM | POA: Diagnosis not present

## 2020-01-22 DIAGNOSIS — Z170 Estrogen receptor positive status [ER+]: Secondary | ICD-10-CM | POA: Insufficient documentation

## 2020-01-22 DIAGNOSIS — C50411 Malignant neoplasm of upper-outer quadrant of right female breast: Secondary | ICD-10-CM | POA: Insufficient documentation

## 2020-01-22 DIAGNOSIS — Z923 Personal history of irradiation: Secondary | ICD-10-CM | POA: Insufficient documentation

## 2020-01-22 NOTE — Progress Notes (Signed)
Radiation Oncology Follow up Note  Name: Allison Hall   Date:   01/22/2020 MRN:  [id number] DOB: 11-09-1940    This 79 y.o. female presents to the clinic today for 3-year follow-up status post whole breast radiation to right breast for stage I ER/PR positive invasive mammary carcinoma.  REFERRING PROVIDER: Clarisse Gouge, MD  HPI: Patient is a an 79 year old female now out 3 years having completed whole breast radiation to her right breast for stage I ER/PR positive invasive mammary carcinoma.  Seen today in routine follow-up she is doing well.  She specifically denies breast tenderness cough or bone pain.  She is currently on.  Aromasin which is causing some hair loss of asked her to discuss that with Dr. Mike Gip on her next visit.  She had mammograms in June which I have reviewed were BI-RADS 2 benign.  COMPLICATIONS OF TREATMENT: none  FOLLOW UP COMPLIANCE: keeps appointments   PHYSICAL EXAM:  BP (!) 152/81 (BP Location: Left Arm, Patient Position: Sitting, Cuff Size: Normal)   Pulse 80   Temp 98.2 F (36.8 C) (Tympanic)   Wt 208 lb (94.3 kg)   BMI 34.61 kg/m  Lungs are clear to A&P cardiac examination essentially unremarkable with regular rate and rhythm. No dominant mass or nodularity is noted in either breast in 2 positions examined. Incision is well-healed. No axillary or supraclavicular adenopathy is appreciated. Cosmetic result is excellent.  Well-developed well-nourished patient in NAD. HEENT reveals PERLA, EOMI, discs not visualized.  Oral cavity is clear. No oral mucosal lesions are identified. Neck is clear without evidence of cervical or supraclavicular adenopathy. Lungs are clear to A&P. Cardiac examination is essentially unremarkable with regular rate and rhythm without murmur rub or thrill. Abdomen is benign with no organomegaly or masses noted. Motor sensory and DTR levels are equal and symmetric in the upper and lower extremities. Cranial nerves II through XII  are grossly intact. Proprioception is intact. No peripheral adenopathy or edema is identified. No motor or sensory levels are noted. Crude visual fields are within normal range.  RADIOLOGY RESULTS: Mammograms reviewed compatible with above-stated findings  PLAN: Present time patient is now 3 years out with no evidence of disease I am pleased with her overall progress.  She continues on Aromasin will discuss hair loss side effect with Dr. Mike Gip.  I have asked to see her back in 1 year for follow-up and then will discontinue follow-up care.  Patient knows to call with any concerns.  I would like to take this opportunity to thank you for allowing me to participate in the care of your patient.Noreene Filbert, MD

## 2020-02-08 ENCOUNTER — Other Ambulatory Visit: Payer: Self-pay

## 2020-02-08 ENCOUNTER — Encounter: Payer: Self-pay | Admitting: Emergency Medicine

## 2020-02-08 ENCOUNTER — Ambulatory Visit
Admission: EM | Admit: 2020-02-08 | Discharge: 2020-02-08 | Disposition: A | Discharge status: Home / Self Care | Payer: Medicare HMO

## 2020-02-08 DIAGNOSIS — J069 Acute upper respiratory infection, unspecified: Secondary | ICD-10-CM

## 2020-02-08 NOTE — Discharge Instructions (Addendum)
You have a viral respiratory infection which could be early Covid and there are no medications to kill viruses of this type and antibiotics are only for bacterial infections which you do not have as per my exam today. I recommend you take musinex if the cough is bothersome.  Also I recommend you stay quarantined for a week in case this will turn to the worse and could be Covid, but since you declined Covid testing, you need to follow precautions.  Watch this video who to do saline nose rinses.  [email or web address]   Do not use tap water, only boiled water that has been cooled down.  Do it twice a day  for 5-7 days, but avoid bed time.  Here are some supplements to help your body fight viral infections you may try if you wish You may take the following supplements to help your immune system be stronger to fight this viral infection Take Quarcetin 500 mg three times a day x 7 days with Zinc 50 mg ones a day x 7 days. The quarcetin is an antiviral and anti-inflammatory supplement which helps open the zinc channels in the cell to absorb Zinc. Zinc helps decrease the virus load in your body. Take Melatonin 6-10 mg at bed time which also helps support your immune system.  Also make sure to take Vit D 5,000 IU per day with a fatty meal and Vit C 1000 mg a day until you are completely better. Stay on Vitamin D 2,000  and C the rest of the season.  Don't lay around, keep active and walk as much as you are able to to prevent worsening of your symptoms.  Follow up with your family Dr next week.  If you get short of breath and you are able to check  your oxygen with a pulse oxygen meter, if it gets to 92% or less, you need to go to the hospital to be admitted. If you dont have one, come back here and we will assess you.

## 2020-02-08 NOTE — ED Triage Notes (Signed)
Patient declines covid testing today.

## 2020-02-08 NOTE — ED Provider Notes (Signed)
[facility]    CSN: [id number] Arrival date & time: 02/08/20  1001      History   Chief Complaint Chief Complaint  Patient presents with  . Cough  . Nasal Congestion    HPI Allison Hall is a 79 y.o. female who presents with onset of nose congestion and cough x 2 days. She feels drainage in the back of her throat which feels this was causing the productive cough, but now is not productive and the coughing fits are better. She denies having a fever. And has not taken any OTC meds for her symptoms She has not had the Covid Injections.  She declines Covid testing    Past Medical History:  Diagnosis Date  . Anxiety   . Breast cancer ([place]) 08/24/2016   right breast UOQ  . Cancer ([place])    skin ca  . Colon polyp   . Depression   . GERD (gastroesophageal reflux disease)   . Hypercholesteremia   . Hypertension   . Osteoarthritis   . Personal history of radiation therapy 2018   RIGHT BREAST CA UOQ  . Thyroid nodule 04/17/2016    Patient Active Problem List   Diagnosis Date Noted  . Chronic pain of left knee 04/04/2018  . Chronic pain syndrome 04/04/2018  . Iron deficiency anemia 10/31/2017  . B12 deficiency 10/31/2017  . Arthralgia 06/03/2017  . Osteoporosis 10/04/2016  . Breast cancer of upper-outer quadrant of right female breast ([place]) 08/24/2016  . Closed fracture of distal end of ulna 06/01/2015  . Triggering of digit 05/27/2014  . Hamstring muscle strain 05/26/2014  . Arthritis of knee, degenerative 03/26/2014  . Long term current use of opiate analgesic 04/21/2013  . Arthritis, degenerative 04/21/2013  . Anxiety 10/05/2011  . Colon polyp 10/05/2011  . Esophageal foreign body 10/05/2011  . Acid reflux 10/05/2011  . BP (high blood pressure) 10/05/2011  . Hypercholesterolemia 10/05/2011    Past Surgical History:  Procedure Laterality Date  . ABDOMINAL HYSTERECTOMY    . APPENDECTOMY    . BREAST BIOPSY Right 08/24/2016   9:30 -  invasive mammary carcinoma with lobular features  . BREAST BIOPSY Right 08/24/2016   10:00 - invasive mammary carcinoma with lobular features  . BREAST LUMPECTOMY Right 10/09/2016   INVASIVE CARCINOMA, TUBULO-LOBULAR VARIANT, CLEAR MARGINS, NEGATIVE LN'S  . COLONOSCOPY W/ POLYPECTOMY  2003, 2008, 2014  . COLONOSCOPY WITH PROPOFOL N/A 12/03/2017   Procedure: COLONOSCOPY WITH PROPOFOL;  Surgeon: Manya Silvas, MD;  Location: [facility] ENDOSCOPY;  Service: Endoscopy;  Laterality: N/A;  . ESOPHAGOGASTRODUODENOSCOPY     foreign body  . ESOPHAGOGASTRODUODENOSCOPY (EGD) WITH PROPOFOL N/A 12/03/2017   Procedure: ESOPHAGOGASTRODUODENOSCOPY (EGD) WITH PROPOFOL;  Surgeon: Manya Silvas, MD;  Location: [facility] ENDOSCOPY;  Service: Endoscopy;  Laterality: N/A;  . JOINT REPLACEMENT Right 2008   TKR  . NOSE SURGERY    . PARTIAL MASTECTOMY WITH NEEDLE LOCALIZATION Right 09/29/2016   Procedure: PARTIAL MASTECTOMY WITH NEEDLE LOCALIZATION;  Surgeon: [name], MD;  Location: [facility] ORS;  Service: General;  Laterality: Right;  . REPLACEMENT TOTAL KNEE Right 2008  . SENTINEL NODE BIOPSY Right 09/29/2016   Procedure: SENTINEL NODE BIOPSY;  Surgeon: [name], MD;  Location: [facility] ORS;  Service: General;  Laterality: Right;    OB History   No obstetric history on file.      Home Medications    Prior to Admission medications   Medication Sig Start Date End Date Taking? Authorizing Provider  acetaminophen (TYLENOL) 500 MG tablet Take 650 mg by mouth every 6 (six) hours as needed.     [provider]  amLODipine (NORVASC) 10 MG tablet TAKE 1 TABLET BY MOUTH EVERY DAY 09/10/18   [provider]  buPROPion (WELLBUTRIN XL) 300 MG 24 hr tablet Take by mouth. 12/25/17   [provider]  busPIRone (BUSPAR) 10 MG tablet Take 10 mg by mouth 3 (three) times daily. 04/17/16   [provider]  calcium carbonate 1250 MG capsule Take 1,250 mg by mouth daily.     [provider]  Cholecalciferol (VITAMIN D3) 1000 units CAPS Take 2,000 Units by mouth 2 (two) times daily.     [provider]  denosumab (PROLIA) 60 MG/ML SOLN injection Inject 60 mg into the skin every 6 (six) months. Administer in upper arm, thigh, or abdomen    [provider]  exemestane (AROMASIN) 25 MG tablet TAKE 1 TABLET (25 MG TOTAL) BY MOUTH DAILY AFTER BREAKFAST. 10/26/19   Corcoran, Drue Second, MD  lisinopril (ZESTRIL) 40 MG tablet Take 40 mg by mouth daily. 11/24/18   [provider]  loperamide (IMODIUM) 2 MG capsule Take 2 mg by mouth as needed for diarrhea or loose stools.     [provider]  Magnesium 250 MG TABS Take 500 mg by mouth daily.     [provider]  Multiple Vitamins-Minerals (PRESERVISION AREDS PO) Take 1 capsule by mouth 2 (two) times daily.    [provider]  omeprazole (PRILOSEC) 40 MG capsule Take 40 mg by mouth daily.  10/20/14 10/09/19  [provider]  rosuvastatin (CRESTOR) 5 MG tablet Take 5 mg by mouth daily. 04/17/16 10/09/19  [provider]  sertraline (ZOLOFT) 100 MG tablet Take 1.5 tablets (150 mg total) by mouth daily. Patient taking differently: Take 100 mg by mouth daily.  05/19/15 10/09/19  Elvin So, MD  Turmeric Curcumin 500 MG CAPS Take 1,500 mg by mouth daily.     [provider]  vitamin B-12 (CYANOCOBALAMIN) 500 MCG tablet Take 1,000 mcg by mouth every other day.     [provider]    Family History Family History  Problem Relation Age of Onset  . Hypertension Mother   . Depression Mother   . Heart failure Father   . Obesity Brother   . Heart disease Brother   . Stroke Brother   . Depression Brother   . Breast cancer Cousin   . Breast cancer Cousin   . Breast cancer Cousin     Social History Social History   Tobacco Use  . Smoking status: Former Smoker    Packs/day: 1.00    Years: 10.00    Pack years: 10.00    Quit date: 05/18/1965     Years since quitting: 54.7  . Smokeless tobacco: Never Used  Vaping Use  . Vaping Use: Never used  Substance Use Topics  . Alcohol use: Yes    Alcohol/week: 7.0 standard drinks    Types: 7 Glasses of wine per week  . Drug use: No     Allergies   Sulfamethoxazole, Other, Statins, Trazodone, Ciprofloxacin, and Sulfa antibiotics   Review of Systems Review of Systems  Constitutional: Positive for fatigue. Negative for appetite change, chills, diaphoresis and fever.  HENT: Positive for postnasal drip and rhinorrhea. Negative for congestion, ear discharge, ear pain and sore throat.   Eyes: Negative for discharge.  Respiratory: Positive for cough. Negative for chest tightness, shortness  of breath and wheezing.   Cardiovascular: Negative for chest pain.  Gastrointestinal: Negative for diarrhea, nausea and vomiting.  Genitourinary: Negative for difficulty urinating.  Musculoskeletal: Negative for myalgias.  Skin: Negative for rash.  Allergic/Immunologic: Positive for environmental allergies.  Neurological: Negative for headaches.  Hematological: Negative for adenopathy.     Physical Exam Triage Vital Signs ED Triage Vitals  Enc Vitals Group     BP      Pulse      Resp      Temp      Temp src      SpO2      Weight      Height      Head Circumference      Peak Flow      Pain Score      Pain Loc      Pain Edu?      Excl. in [place]?    No data found.  Updated Vital Signs There were no vitals taken for this visit.  Visual Acuity Right Eye Distance:   Left Eye Distance:   Bilateral Distance:    Right Eye Near:   Left Eye Near:    Bilateral Near:     Physical Exam Physical Exam Vitals signs and nursing note reviewed.  Constitutional:      General: She is not in acute distress.    Appearance: Normal appearance. She is not ill-appearing, toxic-appearing or diaphoretic.  HENT:     Head: Normocephalic.     Right Ear: Tympanic membrane, ear canal and external ear  normal.     Left Ear: Tympanic membrane, ear canal and external ear normal.     Nose: Nose normal.     Mouth/Throat:     Mouth: Mucous membranes are moist.  Eyes:     General: No scleral icterus.       Right eye: No discharge.        Left eye: No discharge.     Conjunctiva/sclera: Conjunctivae normal.  Neck:     Musculoskeletal: Neck supple. No neck rigidity.  Cardiovascular:     Rate and Rhythm: Normal rate and regular rhythm.     Heart sounds: No murmur.  Pulmonary:     Effort: Pulmonary effort is normal.     Breath sounds: Normal breath sounds.  Abdominal:     General: Bowel sounds are normal. There is no distension.     Palpations: Abdomen is soft. There is no mass.     Tenderness: There is no abdominal tenderness. There is no guarding or rebound.     Hernia: No hernia is present.  Musculoskeletal: Normal range of motion.  Lymphadenopathy:     Cervical: No cervical adenopathy.  Skin:    General: Skin is warm and dry.     Coloration: Skin is not jaundiced.     Findings: No rash.  Neurological:     Mental Status: She is alert and oriented to person, place, and time.     Gait: Gait normal.  Psychiatric:        Mood and Affect: Mood normal.        Behavior: Behavior normal.        Thought Content: Thought content normal.        Judgment: Judgment normal.     UC Treatments / Results  Labs (all labs ordered are listed, but only abnormal results are displayed) Labs Reviewed - No data to display  EKG   Radiology No results found.  Procedures Procedures (including critical care time)  Medications Ordered in UC Medications - No data to display  Initial Impression / Assessment and Plan / UC Course  I have reviewed the triage vital signs and the nursing notes. Has viral URI and she wanted a Zpack for this, but I explained to her it will not help her since she has a viral illness. I showed her a video how to do saline rinses and advised her to do this for post nasal  drainage. See instructions.   Final Clinical Impressions(s) / UC Diagnoses   Final diagnoses:  None   Discharge Instructions   None    ED Prescriptions    None     PDMP not reviewed this encounter.   Shelby Mattocks, PA-C 02/08/20 1041

## 2020-02-08 NOTE — ED Triage Notes (Signed)
Patient in today c/o cough and nasal congestion x 2 days. Patient denies fever. Patient has not taken any OTC medications for her symptoms. Patient has not had the covid vaccines.

## 2020-02-14 DIAGNOSIS — U071 COVID-19: Secondary | ICD-10-CM

## 2020-02-14 HISTORY — DX: COVID-19: U07.1

## 2020-03-31 ENCOUNTER — Encounter: Payer: Self-pay | Admitting: Ophthalmology

## 2020-03-31 ENCOUNTER — Other Ambulatory Visit: Payer: Self-pay

## 2020-04-02 ENCOUNTER — Other Ambulatory Visit: Admission: RE | Admit: 2020-04-02 | Payer: Medicare HMO | Source: Ambulatory Visit

## 2020-04-02 NOTE — Discharge Instructions (Signed)

## 2020-04-06 ENCOUNTER — Ambulatory Visit: Payer: Medicare HMO | Admitting: Anesthesiology

## 2020-04-06 ENCOUNTER — Other Ambulatory Visit: Payer: Self-pay

## 2020-04-06 ENCOUNTER — Ambulatory Visit
Admission: RE | Admit: 2020-04-06 | Discharge: 2020-04-06 | Disposition: A | Discharge status: Home / Self Care | Payer: Medicare HMO | Attending: Ophthalmology | Admitting: Ophthalmology

## 2020-04-06 ENCOUNTER — Encounter: Payer: Self-pay | Admitting: Ophthalmology

## 2020-04-06 ENCOUNTER — Encounter
Admission: RE | Disposition: A | Discharge status: Home / Self Care | Payer: Self-pay | Source: Home / Self Care | Attending: Ophthalmology

## 2020-04-06 DIAGNOSIS — Z888 Allergy status to other drugs, medicaments and biological substances status: Secondary | ICD-10-CM | POA: Insufficient documentation

## 2020-04-06 DIAGNOSIS — Z881 Allergy status to other antibiotic agents status: Secondary | ICD-10-CM | POA: Insufficient documentation

## 2020-04-06 DIAGNOSIS — Z79899 Other long term (current) drug therapy: Secondary | ICD-10-CM | POA: Insufficient documentation

## 2020-04-06 DIAGNOSIS — Z882 Allergy status to sulfonamides status: Secondary | ICD-10-CM | POA: Diagnosis not present

## 2020-04-06 DIAGNOSIS — Z87891 Personal history of nicotine dependence: Secondary | ICD-10-CM | POA: Insufficient documentation

## 2020-04-06 DIAGNOSIS — Z79811 Long term (current) use of aromatase inhibitors: Secondary | ICD-10-CM | POA: Diagnosis not present

## 2020-04-06 DIAGNOSIS — H2512 Age-related nuclear cataract, left eye: Secondary | ICD-10-CM | POA: Insufficient documentation

## 2020-04-06 HISTORY — PX: CATARACT EXTRACTION W/PHACO: SHX586

## 2020-04-06 HISTORY — DX: Scoliosis, unspecified: M41.9

## 2020-04-06 HISTORY — DX: Anemia, unspecified: D64.9

## 2020-04-06 SURGERY — PHACOEMULSIFICATION, CATARACT, WITH IOL INSERTION
Anesthesia: Monitor Anesthesia Care | Site: Eye | Laterality: Left

## 2020-04-06 MED ORDER — FENTANYL CITRATE (PF) 100 MCG/2ML IJ SOLN
INTRAMUSCULAR | Status: DC | PRN
  Administered 2020-04-06: 50 ug via INTRAVENOUS

## 2020-04-06 MED ORDER — NA CHONDROIT SULF-NA HYALURON 40-17 MG/ML IO SOLN
INTRAOCULAR | Status: DC | PRN
  Administered 2020-04-06: 1 mL via INTRAOCULAR

## 2020-04-06 MED ORDER — MOXIFLOXACIN HCL 0.5 % OP SOLN
OPHTHALMIC | Status: DC | PRN
  Administered 2020-04-06: 0.2 mL via OPHTHALMIC

## 2020-04-06 MED ORDER — EPINEPHRINE PF 1 MG/ML IJ SOLN
INTRAOCULAR | Status: DC | PRN
  Administered 2020-04-06: 40 mL via OPHTHALMIC

## 2020-04-06 MED ORDER — BRIMONIDINE TARTRATE-TIMOLOL 0.2-0.5 % OP SOLN
OPHTHALMIC | Status: DC | PRN
  Administered 2020-04-06: 1 [drp] via OPHTHALMIC

## 2020-04-06 MED ORDER — ARMC OPHTHALMIC DILATING DROPS
1.0000 "application " | OPHTHALMIC | Status: DC | PRN
  Administered 2020-04-06 (×3): 1 via OPHTHALMIC

## 2020-04-06 MED ORDER — MIDAZOLAM HCL 2 MG/2ML IJ SOLN
INTRAMUSCULAR | Status: DC | PRN
  Administered 2020-04-06: 1 mg via INTRAVENOUS

## 2020-04-06 MED ORDER — LIDOCAINE HCL (PF) 2 % IJ SOLN
INTRAOCULAR | Status: DC | PRN
  Administered 2020-04-06: 1 mL

## 2020-04-06 MED ORDER — TETRACAINE HCL 0.5 % OP SOLN
1.0000 [drp] | OPHTHALMIC | Status: DC | PRN
  Administered 2020-04-06 (×3): 1 [drp] via OPHTHALMIC

## 2020-04-06 MED ORDER — LACTATED RINGERS IV SOLN: INTRAVENOUS | Status: DC

## 2020-04-06 SURGICAL SUPPLY — 21 items
CANNULA ANT/CHMB 27G (MISCELLANEOUS) ×2 IMPLANT
CANNULA ANT/CHMB 27GA (MISCELLANEOUS) ×4 IMPLANT
GLOVE SURG LX 8.0 MICRO (GLOVE) ×2
GLOVE SURG LX STRL 8.0 MICRO (GLOVE) ×1 IMPLANT
GLOVE SURG TRIUMPH 8.0 PF LTX (GLOVE) ×2 IMPLANT
GOWN STRL REUS W/ TWL LRG LVL3 (GOWN DISPOSABLE) ×2 IMPLANT
GOWN STRL REUS W/TWL LRG LVL3 (GOWN DISPOSABLE) ×4
LENS IOL TECNIS EYHANCE 20.0 (Intraocular Lens) ×1 IMPLANT
MARKER SKIN DUAL TIP RULER LAB (MISCELLANEOUS) ×2 IMPLANT
NDL FILTER BLUNT 18X1 1/2 (NEEDLE) ×1 IMPLANT
NEEDLE FILTER BLUNT 18X 1/2SAF (NEEDLE) ×1
NEEDLE FILTER BLUNT 18X1 1/2 (NEEDLE) ×1 IMPLANT
PACK EYE AFTER SURG (MISCELLANEOUS) ×2 IMPLANT
PACK OPTHALMIC (MISCELLANEOUS) ×2 IMPLANT
PACK PORFILIO (MISCELLANEOUS) ×2 IMPLANT
SUT ETHILON 10-0 CS-B-6CS-B-6 (SUTURE)
SUTURE EHLN 10-0 CS-B-6CS-B-6 (SUTURE) IMPLANT
SYR 3ML LL SCALE MARK (SYRINGE) ×2 IMPLANT
SYR TB 1ML LUER SLIP (SYRINGE) ×2 IMPLANT
WATER STERILE IRR 250ML POUR (IV SOLUTION) ×2 IMPLANT
WIPE NON LINTING 3.25X3.25 (MISCELLANEOUS) ×2 IMPLANT

## 2020-04-06 NOTE — Op Note (Signed)
PREOPERATIVE DIAGNOSIS:  Nuclear sclerotic cataract of the left eye.   POSTOPERATIVE DIAGNOSIS:  Nuclear sclerotic cataract of the left eye.   OPERATIVE PROCEDURE:@   SURGEON:  Birder Robson, MD.   ANESTHESIA:  Anesthesiologist: Ardeth Sportsman, MD CRNA: Cameron Ali, CRNA  1.      Managed anesthesia care. 2.     0.49ml of Shugarcaine was instilled following the paracentesis   COMPLICATIONS:  None.   TECHNIQUE:   Stop and chop   DESCRIPTION OF PROCEDURE:  The patient was examined and consented in the preoperative holding area where the aforementioned topical anesthesia was applied to the left eye and then brought back to the Operating Room where the left eye was prepped and draped in the usual sterile ophthalmic fashion and a lid speculum was placed. A paracentesis was created with the side port blade and the anterior chamber was filled with viscoelastic. A near clear corneal incision was performed with the steel keratome. A continuous curvilinear capsulorrhexis was performed with a cystotome followed by the capsulorrhexis forceps. Hydrodissection and hydrodelineation were carried out with BSS on a blunt cannula. The lens was removed in a stop and chop  technique and the remaining cortical material was removed with the irrigation-aspiration handpiece. The capsular bag was inflated with viscoelastic and the Technis [id number] lens was placed in the capsular bag without complication. The remaining viscoelastic was removed from the eye with the irrigation-aspiration handpiece. The wounds were hydrated. The anterior chamber was flushed with BSS and the eye was inflated to physiologic pressure. 0.66ml Vigamox was placed in the anterior chamber. The wounds were found to be water tight. The eye was dressed with Combigan. The patient was given protective glasses to wear throughout the day and a shield with which to sleep tonight. The patient was also given drops with which to begin a drop regimen today and will  follow-up with me in one day. Implant Name Type Inv. Item Serial No. Manufacturer Lot No. LRB No. Used Action  LENS IOL TECNIS EYHANCE 20.0 - [id number] Intraocular Lens LENS IOL TECNIS EYHANCE 20.0 [id number] JOHNSON   Left 1 Implanted    Procedure(s) with comments: CATARACT EXTRACTION PHACO AND INTRAOCULAR LENS PLACEMENT (IOC) LEFT (Left) - 5.49 0:33.9  Electronically signed: Birder Robson 04/06/2020 12:52 PM

## 2020-04-06 NOTE — Anesthesia Procedure Notes (Signed)
Procedure Name: MAC Date/Time: 04/06/2020 12:42 PM Performed by: Cameron Ali, CRNA Pre-anesthesia Checklist: Patient identified, Emergency Drugs available, Suction available, Timeout performed and Patient being monitored Patient Re-evaluated:Patient Re-evaluated prior to induction Oxygen Delivery Method: Nasal cannula Placement Confirmation: positive ETCO2

## 2020-04-06 NOTE — Anesthesia Preprocedure Evaluation (Signed)
Anesthesia Evaluation  Patient identified by MRN, date of birth, ID band Patient awake    Reviewed: Allergy & Precautions, NPO status , Patient's Chart, lab work & pertinent test results  Airway Mallampati: II  TM Distance: >3 FB Neck ROM: Full    Dental no notable dental hx.    Pulmonary former smoker,    Pulmonary exam normal        Cardiovascular hypertension, Normal cardiovascular exam     Neuro/Psych PSYCHIATRIC DISORDERS Anxiety Depression negative neurological ROS     GI/Hepatic Neg liver ROS, GERD  Controlled,  Endo/Other  BMI 35  Renal/GU negative Renal ROS     Musculoskeletal  (+) Arthritis ,   Abdominal (+) + obese,   Peds  Hematology negative hematology ROS (+)   Anesthesia Other Findings   Reproductive/Obstetrics                             Anesthesia Physical Anesthesia Plan  ASA: II  Anesthesia Plan: MAC   Post-op Pain Management:    Induction: Intravenous  PONV Risk Score and Plan: 2 and TIVA, Midazolam and Treatment may vary due to age or medical condition  Airway Management Planned: Natural Airway and Nasal Cannula  Additional Equipment:   Intra-op Plan:   Post-operative Plan:   Informed Consent: I have reviewed the patients History and Physical, chart, labs and discussed the procedure including the risks, benefits and alternatives for the proposed anesthesia with the patient or authorized representative who has indicated his/her understanding and acceptance.     Dental advisory given  Plan Discussed with: CRNA  Anesthesia Plan Comments:         Anesthesia Quick Evaluation

## 2020-04-06 NOTE — Anesthesia Postprocedure Evaluation (Signed)
Anesthesia Post Note  Patient: Allison Hall  Procedure(s) Performed: CATARACT EXTRACTION PHACO AND INTRAOCULAR LENS PLACEMENT (I[facility]) LEFT (Left Eye)     Patient location during evaluation: PACU Anesthesia Type: MAC Level of consciousness: awake and alert Pain management: pain level controlled Vital Signs Assessment: post-procedure vital signs reviewed and stable Respiratory status: spontaneous breathing and nonlabored ventilation Cardiovascular status: blood pressure returned to baseline Postop Assessment: no apparent nausea or vomiting Anesthetic complications: no   No complications documented.  Keenen Roessner [employer]

## 2020-04-06 NOTE — H&P (Signed)
[facility]   Primary Care Physician:  Wardell Honour, MD Ophthalmologist: Dr. George Ina  Pre-Procedure History & Physical: HPI:  Allison Hall is a 80 y.o. female here for cataract surgery.   Past Medical History:  Diagnosis Date  . Anemia    in past  . Anxiety   . Breast cancer ([place]) 08/24/2016   right breast UOQ  . Cancer ([place])    skin ca  . Colon polyp   . Depression   . GERD (gastroesophageal reflux disease)   . Hypercholesteremia   . Hypertension   . Osteoarthritis   . Personal history of radiation therapy 2018   RIGHT BREAST CA UOQ  . Scoliosis of lumbar spine   . Thyroid nodule 04/17/2016    Past Surgical History:  Procedure Laterality Date  . ABDOMINAL HYSTERECTOMY    . APPENDECTOMY    . BREAST BIOPSY Right 08/24/2016   9:30 - invasive mammary carcinoma with lobular features  . BREAST BIOPSY Right 08/24/2016   10:00 - invasive mammary carcinoma with lobular features  . BREAST LUMPECTOMY Right 10/09/2016   INVASIVE CARCINOMA, TUBULO-LOBULAR VARIANT, CLEAR MARGINS, NEGATIVE LN'S  . COLONOSCOPY W/ POLYPECTOMY  2003, 2008, 2014  . COLONOSCOPY WITH PROPOFOL N/A 12/03/2017   Procedure: COLONOSCOPY WITH PROPOFOL;  Surgeon: Manya Silvas, MD;  Location: [facility] ENDOSCOPY;  Service: Endoscopy;  Laterality: N/A;  . ESOPHAGOGASTRODUODENOSCOPY     foreign body  . ESOPHAGOGASTRODUODENOSCOPY (EGD) WITH PROPOFOL N/A 12/03/2017   Procedure: ESOPHAGOGASTRODUODENOSCOPY (EGD) WITH PROPOFOL;  Surgeon: Manya Silvas, MD;  Location: [facility] ENDOSCOPY;  Service: Endoscopy;  Laterality: N/A;  . JOINT REPLACEMENT Right 2008   TKR  . NOSE SURGERY    . PARTIAL MASTECTOMY WITH NEEDLE LOCALIZATION Right 09/29/2016   Procedure: PARTIAL MASTECTOMY WITH NEEDLE LOCALIZATION;  Surgeon: [name], MD;  Location: [facility] ORS;  Service: General;  Laterality: Right;  . REPLACEMENT TOTAL KNEE Right 2008  . SENTINEL NODE BIOPSY Right 09/29/2016   Procedure: SENTINEL NODE  BIOPSY;  Surgeon: [name], MD;  Location: [facility] ORS;  Service: General;  Laterality: Right;    Prior to Admission medications   Medication Sig Start Date End Date Taking? Authorizing Provider  acetaminophen (TYLENOL) 500 MG tablet Take 650 mg by mouth every 6 (six) hours as needed.    Yes [provider]  amLODipine (NORVASC) 10 MG tablet TAKE 1 TABLET BY MOUTH EVERY DAY 09/10/18  Yes [provider]  buPROPion (WELLBUTRIN XL) 300 MG 24 hr tablet Take by mouth. 12/25/17  Yes [provider]  busPIRone (BUSPAR) 10 MG tablet Take 10 mg by mouth 3 (three) times daily. 04/17/16  Yes [provider]  calcium carbonate 1250 MG capsule Take 1,250 mg by mouth daily.    Yes [provider]  Cholecalciferol (VITAMIN D3) 1000 units CAPS Take 2,000 Units by mouth 2 (two) times daily.    Yes [provider]  denosumab (PROLIA) 60 MG/ML SOLN injection Inject 60 mg into the skin every 6 (six) months. Administer in upper arm, thigh, or abdomen   Yes [provider]  exemestane (AROMASIN) 25 MG tablet TAKE 1 TABLET (25 MG TOTAL) BY MOUTH DAILY AFTER BREAKFAST. 10/26/19  Yes Corcoran, Melissa C, MD  lisinopril (ZESTRIL) 40 MG tablet Take 40 mg by mouth daily. 11/24/18  Yes [provider]  loperamide (IMODIUM) 2 MG capsule Take 2 mg by mouth as needed for diarrhea or loose stools.    Yes [provider]  Magnesium 250 MG TABS Take 500 mg by mouth daily.    Yes [provider]  Multiple Vitamins-Minerals (PRESERVISION AREDS PO) Take 1 capsule by mouth 2 (two) times daily.   Yes [provider]  omeprazole (PRILOSEC) 40 MG capsule Take 40 mg by mouth daily.  10/20/14 04/06/20 Yes [provider]  rosuvastatin (CRESTOR) 5 MG tablet Take 5 mg by mouth daily. 04/17/16 04/06/20 Yes [provider]  sertraline (ZOLOFT) 100 MG tablet Take 1.5 tablets (150 mg total) by mouth daily. Patient taking  differently: Take 100 mg by mouth daily. 05/19/15 02/08/20 Yes Ravi, Himabindu, MD  Turmeric Curcumin 500 MG CAPS Take 1,500 mg by mouth daily.    Yes [provider]  vitamin B-12 (CYANOCOBALAMIN) 500 MCG tablet Take 1,000 mcg by mouth every other day.    Yes [provider]    Allergies as of 02/13/2020 - Review Complete 02/08/2020  Allergen Reaction Noted  . Sulfamethoxazole Other (See Comments) 05/19/2015  . Other Itching 02/04/2019  . Statins Other (See Comments) 10/11/2011  . Trazodone Other (See Comments) 08/16/2015  . Ciprofloxacin Anxiety 09/21/2016  . Sulfa antibiotics Anxiety 11/29/2014    Family History  Problem Relation Age of Onset  . Hypertension Mother   . Depression Mother   . Heart failure Father   . Obesity Brother   . Heart disease Brother   . Stroke Brother   . Depression Brother   . Breast cancer Cousin   . Breast cancer Cousin   . Breast cancer Cousin     Social History   Socioeconomic History  . Marital status: Married    Spouse name: Not on file  . Number of children: Not on file  . Years of education: Not on file  . Highest education level: Not on file  Occupational History  . Not on file  Tobacco Use  . Smoking status: Former Smoker    Packs/day: 1.00    Years: 10.00    Pack years: 10.00    Quit date: 05/18/1965    Years since quitting: 54.9  . Smokeless tobacco: Never Used  Vaping Use  . Vaping Use: Never used  Substance and Sexual Activity  . Alcohol use: Yes    Alcohol/week: 7.0 standard drinks    Types: 7 Glasses of wine per week  . Drug use: No  . Sexual activity: Not Currently  Other Topics Concern  . Not on file  Social History Narrative  . Not on file   Social Determinants of Health   Financial Resource Strain: Not on file  Food Insecurity: Not on file  Transportation Needs: Not on file  Physical Activity: Not on file  Stress: Not on file  Social Connections: Not on file  Intimate Partner Violence:  Not on file    Review of Systems: See HPI, otherwise negative ROS  Physical Exam: BP 134/82   Pulse 94   Temp (!) 97 F (36.1 C) (Temporal)   Ht 5\' 4"  (1.626 m)   Wt 94.3 kg   SpO2 98%   BMI 35.70 kg/m  General:   Alert,  pleasant and cooperative in NAD Head:  Normocephalic and atraumatic. Respiratory:  Normal work of breathing.  Impression/Plan: [name] is here for cataract surgery.  Risks, benefits, limitations, and alternatives regarding cataract surgery have been reviewed with the patient.  Questions have been answered.  All parties agreeable.   Birder Robson, MD  04/06/2020, 12:26 PM

## 2020-04-06 NOTE — Transfer of Care (Signed)
Immediate Anesthesia Transfer of Care Note  Patient: Allison Hall  Procedure(s) Performed: CATARACT EXTRACTION PHACO AND INTRAOCULAR LENS PLACEMENT ([place]) LEFT (Left Eye)  Patient Location: PACU  Anesthesia Type: MAC  Level of Consciousness: awake, alert  and patient cooperative  Airway and Oxygen Therapy: Patient Spontanous Breathing and Patient connected to supplemental oxygen  Post-op Assessment: Post-op Vital signs reviewed, Patient's Cardiovascular Status Stable, Respiratory Function Stable, Patent Airway and No signs of Nausea or vomiting  Post-op Vital Signs: Reviewed and stable  Complications: No complications documented.

## 2020-04-07 ENCOUNTER — Encounter: Payer: Self-pay | Admitting: Ophthalmology

## 2020-04-08 ENCOUNTER — Other Ambulatory Visit: Payer: Medicare HMO

## 2020-04-08 ENCOUNTER — Ambulatory Visit: Payer: Medicare HMO | Admitting: Hematology and Oncology

## 2020-04-08 ENCOUNTER — Other Ambulatory Visit: Payer: Self-pay

## 2020-04-08 ENCOUNTER — Ambulatory Visit: Payer: Medicare HMO

## 2020-04-08 DIAGNOSIS — Z170 Estrogen receptor positive status [ER+]: Secondary | ICD-10-CM

## 2020-04-08 NOTE — Progress Notes (Incomplete)
[facility]  [address] [place], [place] [address] Phone: [phone]  Fax: [phone]  Clinic Day:  04/08/2020  Referring physician: Wardell Honour, MD  Chief Complaint: Allison Hall is a 80 y.o. female with multi-focal right breast cancer, osteoporosis, iron deficiency anemia,and B12 deficiency who is seen for 6 month assessment   HPI: The patient was last seen in the medical oncology clinic on 10/09/2019 by Beckey Rutter, NP. At that time, she was doing well. She voiced no concerns. Hematocrit was 38.2, hemoglobin 12.8, platelets 192,000, WBC 5,400. Creatinine was 1.25 (CrCl 41 ml/min). Folate was 12.7. CA27.29 was 10.2. She received Prolia. She continued Aromasin, vitamin B12, calcium, and vitamin D.  The patient was seen at [facility] on 02/08/2020 for congestion and cough x 2 days. She was not vaccinated for COVID-19 and declined COVID-19 testing. She was advised to do saline rinses for post-nasal drainage.  The patient underwent left cataract extraction on 04/06/2020 by Dr. George Ina. She is scheduled for right cataract extraction on 04/20/2020.  During the interim, ***   Past Medical History:  Diagnosis Date  . Anemia    in past  . Anxiety   . Breast cancer ([place]) 08/24/2016   right breast UOQ  . Cancer ([place])    skin ca  . Colon polyp   . Depression   . GERD (gastroesophageal reflux disease)   . Hypercholesteremia   . Hypertension   . Osteoarthritis   . Personal history of radiation therapy 2018   RIGHT BREAST CA UOQ  . Scoliosis of lumbar spine   . Thyroid nodule 04/17/2016    Past Surgical History:  Procedure Laterality Date  . ABDOMINAL HYSTERECTOMY    . APPENDECTOMY    . BREAST BIOPSY Right 08/24/2016   9:30 - invasive mammary carcinoma with lobular features  . BREAST BIOPSY Right 08/24/2016   10:00 - invasive mammary carcinoma with lobular features  . BREAST LUMPECTOMY Right 10/09/2016   INVASIVE  CARCINOMA, TUBULO-LOBULAR VARIANT, CLEAR MARGINS, NEGATIVE LN'S  . CATARACT EXTRACTION W/PHACO Left 04/06/2020   Procedure: CATARACT EXTRACTION PHACO AND INTRAOCULAR LENS PLACEMENT ([place]) LEFT;  Surgeon: Birder Robson, MD;  Location: [place];  Service: Ophthalmology;  Laterality: Left;  5.49 0:33.9  . COLONOSCOPY W/ POLYPECTOMY  2003, 2008, 2014  . COLONOSCOPY WITH PROPOFOL N/A 12/03/2017   Procedure: COLONOSCOPY WITH PROPOFOL;  Surgeon: Manya Silvas, MD;  Location: [facility] ENDOSCOPY;  Service: Endoscopy;  Laterality: N/A;  . ESOPHAGOGASTRODUODENOSCOPY     foreign body  . ESOPHAGOGASTRODUODENOSCOPY (EGD) WITH PROPOFOL N/A 12/03/2017   Procedure: ESOPHAGOGASTRODUODENOSCOPY (EGD) WITH PROPOFOL;  Surgeon: Manya Silvas, MD;  Location: [facility] ENDOSCOPY;  Service: Endoscopy;  Laterality: N/A;  . JOINT REPLACEMENT Right 2008   TKR  . NOSE SURGERY    . PARTIAL MASTECTOMY WITH NEEDLE LOCALIZATION Right 09/29/2016   Procedure: PARTIAL MASTECTOMY WITH NEEDLE LOCALIZATION;  Surgeon: [name], MD;  Location: [facility] ORS;  Service: General;  Laterality: Right;  . REPLACEMENT TOTAL KNEE Right 2008  . SENTINEL NODE BIOPSY Right 09/29/2016   Procedure: SENTINEL NODE BIOPSY;  Surgeon: [name], MD;  Location: [facility] ORS;  Service: General;  Laterality: Right;    Family History  Problem Relation Age of Onset  . Hypertension Mother   . Depression Mother   . Heart failure Father   . Obesity Brother   . Heart disease Brother   . Stroke Brother   . Depression Brother   .  Breast cancer Cousin   . Breast cancer Cousin   . Breast cancer Cousin     Social History:  reports that she quit smoking about 54 years ago. She has a 10.00 pack-year smoking history. She has never used smokeless tobacco. She reports current alcohol use of about 7.0 standard drinks of alcohol per week. She reports that she does not use drugs. She previously smoked 1/2 pack/day x 10 years. She stopped  smoking on 10/11/1978. She drinks 6 glasses of wine/week. She has 3 children (2 sons and 1 daughter). 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 She likes to do crossword puzzles. Her daughter's name is [name]. Her husband is in an assisted living facility [facility]) secondary to dementia. She lives in [place] The patient is alone*** today.  Allergies:  Allergies  Allergen Reactions  . Sulfamethoxazole Other (See Comments)    Anxiety and imsomnia  . Other Itching    Vit C-vit E-copper-zinc-lutein - itching and fatigue (per [facility])  . Statins Other (See Comments)    Muscle pain   . Trazodone Other (See Comments)    Pt states she felt "unsteady"   . Ciprofloxacin Anxiety  . Sulfa Antibiotics Anxiety    Current Medications: Current Outpatient Medications  Medication Sig Dispense Refill  . acetaminophen (TYLENOL) 500 MG tablet Take 650 mg by mouth every 6 (six) hours as needed.     Marland Kitchen amLODipine (NORVASC) 10 MG tablet TAKE 1 TABLET BY MOUTH EVERY DAY    . buPROPion (WELLBUTRIN XL) 300 MG 24 hr tablet Take by mouth.    . busPIRone (BUSPAR) 10 MG tablet Take 10 mg by mouth 3 (three) times daily.    . calcium carbonate 1250 MG capsule Take 1,250 mg by mouth daily.     . Cholecalciferol (VITAMIN D3) 1000 units CAPS Take 2,000 Units by mouth 2 (two) times daily.     Marland Kitchen denosumab (PROLIA) 60 MG/ML SOLN injection Inject 60 mg into the skin every 6 (six) months. Administer in upper arm, thigh, or abdomen    . exemestane (AROMASIN) 25 MG tablet TAKE 1 TABLET (25 MG TOTAL) BY MOUTH DAILY AFTER BREAKFAST. 90 tablet 3  . lisinopril (ZESTRIL) 40 MG tablet Take 40 mg by mouth daily.    Marland Kitchen loperamide (IMODIUM) 2 MG capsule Take 2 mg by mouth as needed for diarrhea or loose stools.     . Magnesium 250 MG TABS Take 500 mg by mouth daily.     . Multiple Vitamins-Minerals (PRESERVISION AREDS PO) Take 1 capsule by mouth 2 (two) times daily.    Marland Kitchen omeprazole (PRILOSEC) 40 MG capsule Take 40 mg by mouth  daily.     . rosuvastatin (CRESTOR) 5 MG tablet Take 5 mg by mouth daily.    . sertraline (ZOLOFT) 100 MG tablet Take 1.5 tablets (150 mg total) by mouth daily. (Patient taking differently: Take 100 mg by mouth daily.) 45 tablet 1  . Turmeric Curcumin 500 MG CAPS Take 1,500 mg by mouth daily.     . vitamin B-12 (CYANOCOBALAMIN) 500 MCG tablet Take 1,000 mcg by mouth every other day.      No current facility-administered medications for this visit.   Review of Systems  Constitutional: Negative for chills, diaphoresis, fever, malaise/fatigue and weight loss.  HENT: Negative for congestion, ear discharge, ear pain, hearing loss, nosebleeds, sinus pain, sore throat and tinnitus.   Eyes: Negative for blurred vision.  Respiratory: Negative for cough, hemoptysis, sputum production and shortness of  breath.   Cardiovascular: Negative for chest pain, palpitations and leg swelling.  Gastrointestinal: Negative for abdominal pain, blood in stool, constipation, diarrhea, heartburn, melena, nausea and vomiting.  Genitourinary: Negative for dysuria, frequency, hematuria and urgency.  Musculoskeletal: Negative for back pain, joint pain, myalgias and neck pain.  Skin: Negative for itching and rash.  Neurological: Negative for dizziness, tingling, sensory change, weakness and headaches.  Endo/Heme/Allergies: Does not bruise/bleed easily.  Psychiatric/Behavioral: Negative for depression and memory loss. The patient is not nervous/anxious and does not have insomnia.   All other systems reviewed and are negative.   Performance status (ECOG): 1  Vitals There were no vitals taken for this visit.   Physical Exam Vitals and nursing note reviewed.  Constitutional:      General: She is not in acute distress.    Appearance: She is not diaphoretic.  HENT:     Head: Normocephalic and atraumatic.     Mouth/Throat:     Mouth: Mucous membranes are moist.     Pharynx: Oropharynx is clear.  Eyes:     General: No  scleral icterus.    Extraocular Movements: Extraocular movements intact.     Conjunctiva/sclera: Conjunctivae normal.     Pupils: Pupils are equal, round, and reactive to light.  Cardiovascular:     Rate and Rhythm: Normal rate and regular rhythm.     Heart sounds: Normal heart sounds. No murmur heard.   Pulmonary:     Effort: Pulmonary effort is normal. No respiratory distress.     Breath sounds: Normal breath sounds. No wheezing or rales.  Chest:     Chest wall: No tenderness.  Breasts:     Right: No axillary adenopathy or supraclavicular adenopathy.     Left: No axillary adenopathy or supraclavicular adenopathy.    Abdominal:     General: Bowel sounds are normal. There is no distension.     Palpations: Abdomen is soft. There is no mass.     Tenderness: There is no abdominal tenderness. There is no guarding or rebound.  Musculoskeletal:        General: No swelling or tenderness. Normal range of motion.     Cervical back: Normal range of motion and neck supple.  Lymphadenopathy:     Head:     Right side of head: No preauricular, posterior auricular or occipital adenopathy.     Left side of head: No preauricular, posterior auricular or occipital adenopathy.     Cervical: No cervical adenopathy.     Upper Body:     Right upper body: No supraclavicular or axillary adenopathy.     Left upper body: No supraclavicular or axillary adenopathy.     Lower Body: No right inguinal adenopathy. No left inguinal adenopathy.  Skin:    General: Skin is warm and dry.  Neurological:     Mental Status: She is alert and oriented to person, place, and time.  Psychiatric:        Behavior: Behavior normal.        Thought Content: Thought content normal.        Judgment: Judgment normal.      No visits with results within 3 Day(s) from this visit.  Latest known visit with results is:  Appointment on 10/09/2019  Component Date Value Ref Range Status  . Folate 10/09/2019 12.7  >5.9 ng/mL  Final   Performed at [facility], [address] [place], [place] [address]  . WBC 10/09/2019 5.4  4.0 - 10.5  K/uL Final  . RBC 10/09/2019 4.04  3.87 - 5.11 MIL/uL Final  . Hemoglobin 10/09/2019 12.8  12.0 - 15.0 g/dL Final  . HCT 10/09/2019 38.2  36.0 - 46.0 % Final  . MCV 10/09/2019 94.6  80.0 - 100.0 fL Final  . MCH 10/09/2019 31.7  26.0 - 34.0 pg Final  . MCHC 10/09/2019 33.5  30.0 - 36.0 g/dL Final  . RDW 10/09/2019 13.2  11.5 - 15.5 % Final  . Platelets 10/09/2019 192  150 - 400 K/uL Final  . nRBC 10/09/2019 0.0  0.0 - 0.2 % Final  . Neutrophils Relative % 10/09/2019 56  % Final  . Neutro Abs 10/09/2019 3.1  1.7 - 7.7 K/uL Final  . Lymphocytes Relative 10/09/2019 28  % Final  . Lymphs Abs 10/09/2019 1.5  0.7 - 4.0 K/uL Final  . Monocytes Relative 10/09/2019 11  % Final  . Monocytes Absolute 10/09/2019 0.6  0.1 - 1.0 K/uL Final  . Eosinophils Relative 10/09/2019 3  % Final  . Eosinophils Absolute 10/09/2019 0.2  0.0 - 0.5 K/uL Final  . Basophils Relative 10/09/2019 1  % Final  . Basophils Absolute 10/09/2019 0.1  0.0 - 0.1 K/uL Final  . Immature Granulocytes 10/09/2019 1  % Final  . Abs Immature Granulocytes 10/09/2019 0.03  0.00 - 0.07 K/uL Final   Performed at [facility], [address], [place], [place] [address]  . Sodium 10/09/2019 138  135 - 145 mmol/L Final  . Potassium 10/09/2019 4.1  3.5 - 5.1 mmol/L Final  . Chloride 10/09/2019 103  98 - 111 mmol/L Final  . CO2 10/09/2019 27  22 - 32 mmol/L Final  . Glucose, Bld 10/09/2019 93  70 - 99 mg/dL Final   Glucose reference range applies only to samples taken after fasting for at least 8 hours.  . BUN 10/09/2019 25* 8 - 23 mg/dL Final  . Creatinine, Ser 10/09/2019 1.25* 0.44 - 1.00 mg/dL Final  . Calcium 10/09/2019 9.3  8.9 - 10.3 mg/dL Final  . Total Protein 10/09/2019 7.3  6.5 - 8.1 g/dL Final  . Albumin 10/09/2019 4.5  3.5 - 5.0 g/dL Final  . AST 10/09/2019 22  15 - 41 U/L Final  . ALT  10/09/2019 23  0 - 44 U/L Final  . Alkaline Phosphatase 10/09/2019 48  38 - 126 U/L Final  . Total Bilirubin 10/09/2019 0.5  0.3 - 1.2 mg/dL Final  . GFR calc non Af Amer 10/09/2019 41* >60 mL/min Final  . GFR calc Af Amer 10/09/2019 47* >60 mL/min Final  . Anion gap 10/09/2019 8  5 - 15 Final   Performed at [facility], [address], [place], [place] [address]  . CA 27.29 10/09/2019 10.2  0.0 - 38.6 U/mL Final   Comment: (NOTE) Siemens Centaur Immunochemiluminometric Methodology V [facility]) Values obtained with different assay methods or kits cannot be used interchangeably. Results cannot be interpreted as absolute evidence of the presence or absence of malignant disease. Performed At: [facility] [place], [place] [id number] Rush Farmer MD WP:[id number]     Assessment:  Allison Hall is a 80 y.o. female withmulti-focalright breast cancers/p partial mastectomy with sentinel lymph node biopsy on 09/29/2016. 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 Three sentinel lymph nodes were negative. Tumor was ER + (>90%), PR + (>90%) and Her2/neu 2+ (negative by FISH). Pathologic stage  was T1c(m) N0(sn). CA27.29was 13.4 on 09/06/2016.  MammaPrintrevealed a 29% risk of recurrence (high). She was seen for second opinion at [facility] on 10/25/2016. Chemotherapy was not recommended.  Bilateral screening mammogramon 07/24/2016 revealed a possible area of distortion in the right breast. Diagnostic mammogram on 08/10/2016 revealed 2 suspicious masses in the upper-outer quadrant of the right breast in a broad area of confluent distortion in the upper-outer quadrant of the right breast. There are at least 3 areas of confluent distortion spanning approximately 6.5 cm of tissue. Ultrasound on 07/31/2016 revealed an irregular hypoechoic 1.2 x 1.1 x 1.2 cm  at the 9:30 position 7 cm from the nipple. In addition, there was an irregular 1.9 x 0.8 x 1.2 cm mass at the 10 o'clock position 5 cm from the nipple. There were multiple small normal-appearing lymph nodes.   Breast MRIon 09/16/2016 revealed an irregular 1.6 x 1.2 x 1.6 cm enhancing mass adjacent to the clip artifact within the upper, outer right breast, middle depth, corresponding to the mass seen at 10 o'clock. There was an additional irregular enhancing mass adjacent to the more posterior clip artifact measuring 2.6 x 1.4 x 1.6 cm, corresponding to the mass seen at 9:30 position. Together, these 2 masses span 6.3 cm of breast tissue from anterior to posterior. There was no definite additional suspicious area of enhancement within the right breast to correspond to the third area of distortion identified on diagnostic mammogram dated 08/10/2016. Left breast revealed no suspicious mass or enhancement.  Bilateraldiagnostic mammogramon 09/03/2019 revealed no mammographic evidence of malignancy.   She received radiationfrom 11/15/2016 - 01/08/2017. She was Femara(01/17/2017 - 05/16/2017) secondary to arthralgias. She discontinued Arimidexafter 19 days (last 06/14/2017) secondary to fatigue and polyarthralgias. She beganAromasinon 06/27/2017.  CA27.29 has been followed: 13.4 on 09/06/2016, 13.0 on 05/16/2017, 17.1 on 08/29/2017, 12.1 on 06/04/2018, 13.8 on 10/01/2018, 15.6 on 02/04/2019, and 18.3 on 04/10/2019.  Bone density studyon 09/30/2015 revealed osteopeniawith a T-score of -2.1 in the right femoral neck. Bone density studyrevealed osteoporosison 07/09/2019witha T-score of -3.1. She began Proliaon 02/14/2017 (last 04/10/2019).  She was diagnosed with iron deficiency anemiaand B12 deficiencyon 08/29/2017. TSH and folate were normal. She is on oral B12 500 mcg a day. Intrinsic factor and anti-parietal cell antibodies were normal on 01/02/2018. B12 was 888 on  01/29/2018 and 544 on 02/04/2019.  Folate was 12.7 on 10/09/2019.   She received Venoferweekly x 3 (10/31/2017 - 11/13/2017)and x 4 (06/05/2018 - 06/26/2018).  Ferritinhas been followed: 10 on 08/29/2017, 174 on 12/12/2017, 74 on 01/02/2018, 64 on 01/29/2018, 3 on 06/04/2018, 205 on 10/01/2018, and 162 on 02/04/2019.  EGDand colonoscopyon 12/03/2017. Polyps were noted. Pathology was negative for dysplasia and malignancy.  Bilateral diagnostic mammogram on 09/02/2018 revealed no evidence of malignancy in either breast, s/p right lumpectomy.  Symptomatically, ***  Plan: 1.   Labs today: CBC with diff, CMP, CA27.29, B12, ferritin.   2.MultifocalRIGHTbreast cancer Clinically, she continues to do well Exam on 02/05/2019 revealed no evidence of recurrent disease.             Encourage self breast exams.  Mammogram on 09/02/2018 revealed no recurrent disease.  Schedule mammogram on 09/02/2019. Continue Aromasin.  RN to [employer] regarding dropping Aromasin to tier one (previously done by Aaron Edelman). 3.Iron deficiency anemia Hematocrit 36.9 Hemoglobin 12.3. MCV 99.5. Ferritin 162 on 02/04/2019. Patient off oral iron. Consider capsule study if ferritin drops to <100. Continue to monitor. 4.Osteoporosis Patient on calcium 1200 mg and vitamin D 800 IU  daily. Patient receives Prolia every 6 months(last 04/10/2019). Patient notes no dental concerns. Schedule bone density on 10/03/2019. 5.B12 deficiency B12 was544 on 02/04/2019. Patient decreased B12 from 1000 mcg/day to 500 mcg/day. B12 goal is >= 400. B12 level was 544 on 02/04/2019. Folate was 12.7 on 10/09/2019. 6.   RTC on 10/10/2019 for MD assessment, labs (CBC with diff, CMP, CA 27.29, folate), and Prolia.  I discussed the assessment and treatment plan with the patient.  The patient was provided an  opportunity to ask questions and all were answered.  The patient agreed with the plan and demonstrated an understanding of the instructions.  The patient was advised to call back if the symptoms worsen or if the condition fails to improve as anticipated.  I provided *** minutes of face-to-face time during this this encounter and > 50% was spent counseling as documented under my assessment and plan.  Lequita Asal, MD, PhD    04/08/2020, 11:41 AM  I, De Burrs, am acting as a Education administrator for Lequita Asal, MD.  I, [place] Mike Gip, MD, have reviewed the above documentation for accuracy and completeness, and I agree with the above.

## 2020-04-11 ENCOUNTER — Other Ambulatory Visit: Payer: Self-pay | Admitting: Hematology and Oncology

## 2020-04-11 DIAGNOSIS — C50411 Malignant neoplasm of upper-outer quadrant of right female breast: Secondary | ICD-10-CM

## 2020-04-11 DIAGNOSIS — D509 Iron deficiency anemia, unspecified: Secondary | ICD-10-CM

## 2020-04-11 DIAGNOSIS — E538 Deficiency of other specified B group vitamins: Secondary | ICD-10-CM

## 2020-04-11 DIAGNOSIS — Z170 Estrogen receptor positive status [ER+]: Secondary | ICD-10-CM

## 2020-04-12 ENCOUNTER — Inpatient Hospital Stay: Payer: Medicare HMO

## 2020-04-12 ENCOUNTER — Inpatient Hospital Stay: Payer: Medicare HMO | Admitting: Hematology and Oncology

## 2020-04-12 DIAGNOSIS — C50411 Malignant neoplasm of upper-outer quadrant of right female breast: Secondary | ICD-10-CM

## 2020-04-12 NOTE — Progress Notes (Deleted)
[facility]  [address], [address] [place], [name] [address] Phone: [phone]  Fax: [phone]  Clinic Day:  04/12/2020  Referring physician: Wardell Honour, MD  Chief Complaint: Allison Hall is a 80 y.o. female with multi-focal right breast cancer, osteoporosis, iron deficiency anemia,and B12 deficiency who is seen for 6 month assessment   HPI: The patient was last seen in the medical oncology clinic on 10/09/2019 by Beckey Rutter, NP. At that time, she was doing well. She voiced no concerns. Hematocrit was 38.2, hemoglobin 12.8, platelets 192,000, WBC 5,400. Creatinine was 1.25 (CrCl 41 ml/min). Folate was 12.7. CA27.29 was 10.2. She received Prolia. She continued Aromasin, vitamin B12, calcium, and vitamin D.  The patient was seen at [facility] on 02/08/2020 for congestion and cough x 2 days. She was not vaccinated for COVID-19 and declined COVID-19 testing. She was advised to do saline rinses for post-nasal drainage.  The patient underwent left cataract extraction on 04/06/2020 by Dr. George Ina. She is scheduled for right cataract extraction on 04/20/2020.  During the interim, ***   Past Medical History:  Diagnosis Date  . Anemia    in past  . Anxiety   . Breast cancer ([place]) 08/24/2016   right breast UOQ  . Cancer ([place])    skin ca  . Colon polyp   . Depression   . GERD (gastroesophageal reflux disease)   . Hypercholesteremia   . Hypertension   . Osteoarthritis   . Personal history of radiation therapy 2018   RIGHT BREAST CA UOQ  . Scoliosis of lumbar spine   . Thyroid nodule 04/17/2016    Past Surgical History:  Procedure Laterality Date  . ABDOMINAL HYSTERECTOMY    . APPENDECTOMY    . BREAST BIOPSY Right 08/24/2016   9:30 - invasive mammary carcinoma with lobular features  . BREAST BIOPSY Right 08/24/2016   10:00 - invasive mammary carcinoma with lobular features  . BREAST LUMPECTOMY Right 10/09/2016   INVASIVE  CARCINOMA, TUBULO-LOBULAR VARIANT, CLEAR MARGINS, NEGATIVE LN'S  . CATARACT EXTRACTION W/PHACO Left 04/06/2020   Procedure: CATARACT EXTRACTION PHACO AND INTRAOCULAR LENS PLACEMENT ([place]) LEFT;  Surgeon: Birder Robson, MD;  Location: [place];  Service: Ophthalmology;  Laterality: Left;  5.49 0:33.9  . COLONOSCOPY W/ POLYPECTOMY  2003, 2008, 2014  . COLONOSCOPY WITH PROPOFOL N/A 12/03/2017   Procedure: COLONOSCOPY WITH PROPOFOL;  Surgeon: Manya Silvas, MD;  Location: [facility] ENDOSCOPY;  Service: Endoscopy;  Laterality: N/A;  . ESOPHAGOGASTRODUODENOSCOPY     foreign body  . ESOPHAGOGASTRODUODENOSCOPY (EGD) WITH PROPOFOL N/A 12/03/2017   Procedure: ESOPHAGOGASTRODUODENOSCOPY (EGD) WITH PROPOFOL;  Surgeon: Manya Silvas, MD;  Location: [facility] ENDOSCOPY;  Service: Endoscopy;  Laterality: N/A;  . JOINT REPLACEMENT Right 2008   TKR  . NOSE SURGERY    . PARTIAL MASTECTOMY WITH NEEDLE LOCALIZATION Right 09/29/2016   Procedure: PARTIAL MASTECTOMY WITH NEEDLE LOCALIZATION;  Surgeon: [name], MD;  Location: [facility] ORS;  Service: General;  Laterality: Right;  . REPLACEMENT TOTAL KNEE Right 2008  . SENTINEL NODE BIOPSY Right 09/29/2016   Procedure: SENTINEL NODE BIOPSY;  Surgeon: [name], MD;  Location: [facility] ORS;  Service: General;  Laterality: Right;    Family History  Problem Relation Age of Onset  . Hypertension Mother   . Depression Mother   . Heart failure Father   . Obesity Brother   . Heart disease Brother   . Stroke Brother   . Depression Brother   .  Breast cancer Cousin   . Breast cancer Cousin   . Breast cancer Cousin     Social History:  reports that she quit smoking about 54 years ago. She has a 10.00 pack-year smoking history. She has never used smokeless tobacco. She reports current alcohol use of about 7.0 standard drinks of alcohol per week. She reports that she does not use drugs. She previously smoked 1/2 pack/day x 10 years. She stopped  smoking on 10/11/1978. She drinks 6 glasses of wine/week. She has 3 children (2 sons and 1 daughter). She is a retired Clinical cytogeneticist for [employer]. She likes to do crossword puzzles. Her daughter's name is Allison Hall. Her husband is in an assisted living facility [name]) secondary to dementia. She lives in [place] The patient is alone*** today.  Allergies:  Allergies  Allergen Reactions  . Sulfamethoxazole Other (See Comments)    Anxiety and imsomnia  . Other Itching    Vit C-vit E-copper-zinc-lutein - itching and fatigue (per [facility])  . Statins Other (See Comments)    Muscle pain   . Trazodone Other (See Comments)    Pt states she felt "unsteady"   . Ciprofloxacin Anxiety  . Sulfa Antibiotics Anxiety    Current Medications: Current Outpatient Medications  Medication Sig Dispense Refill  . acetaminophen (TYLENOL) 500 MG tablet Take 650 mg by mouth every 6 (six) hours as needed.     Marland Kitchen amLODipine (NORVASC) 10 MG tablet TAKE 1 TABLET BY MOUTH EVERY DAY    . buPROPion (WELLBUTRIN XL) 300 MG 24 hr tablet Take by mouth.    . busPIRone (BUSPAR) 10 MG tablet Take 10 mg by mouth 3 (three) times daily.    . calcium carbonate 1250 MG capsule Take 1,250 mg by mouth daily.     . Cholecalciferol (VITAMIN D3) 1000 units CAPS Take 2,000 Units by mouth 2 (two) times daily.     Marland Kitchen denosumab (PROLIA) 60 MG/ML SOLN injection Inject 60 mg into the skin every 6 (six) months. Administer in upper arm, thigh, or abdomen    . exemestane (AROMASIN) 25 MG tablet TAKE 1 TABLET (25 MG TOTAL) BY MOUTH DAILY AFTER BREAKFAST. 90 tablet 3  . lisinopril (ZESTRIL) 40 MG tablet Take 40 mg by mouth daily.    Marland Kitchen loperamide (IMODIUM) 2 MG capsule Take 2 mg by mouth as needed for diarrhea or loose stools.     . Magnesium 250 MG TABS Take 500 mg by mouth daily.     . Multiple Vitamins-Minerals (PRESERVISION AREDS PO) Take 1 capsule by mouth 2 (two) times daily.    Marland Kitchen omeprazole (PRILOSEC) 40 MG capsule Take 40 mg by mouth  daily.     . rosuvastatin (CRESTOR) 5 MG tablet Take 5 mg by mouth daily.    . sertraline (ZOLOFT) 100 MG tablet Take 1.5 tablets (150 mg total) by mouth daily. (Patient taking differently: Take 100 mg by mouth daily.) 45 tablet 1  . Turmeric Curcumin 500 MG CAPS Take 1,500 mg by mouth daily.     . vitamin B-12 (CYANOCOBALAMIN) 500 MCG tablet Take 1,000 mcg by mouth every other day.      No current facility-administered medications for this visit.   Review of Systems  Constitutional: Negative for chills, diaphoresis, fever, malaise/fatigue and weight loss.  HENT: Negative for congestion, ear discharge, ear pain, hearing loss, nosebleeds, sinus pain, sore throat and tinnitus.   Eyes: Negative for blurred vision.  Respiratory: Negative for cough, hemoptysis, sputum production and shortness of  breath.   Cardiovascular: Negative for chest pain, palpitations and leg swelling.  Gastrointestinal: Negative for abdominal pain, blood in stool, constipation, diarrhea, heartburn, melena, nausea and vomiting.  Genitourinary: Negative for dysuria, frequency, hematuria and urgency.  Musculoskeletal: Negative for back pain, joint pain, myalgias and neck pain.  Skin: Negative for itching and rash.  Neurological: Negative for dizziness, tingling, sensory change, weakness and headaches.  Endo/Heme/Allergies: Does not bruise/bleed easily.  Psychiatric/Behavioral: Negative for depression and memory loss. The patient is not nervous/anxious and does not have insomnia.   All other systems reviewed and are negative.   Performance status (ECOG): 1  Vitals There were no vitals taken for this visit.   Physical Exam Vitals and nursing note reviewed.  Constitutional:      General: She is not in acute distress.    Appearance: She is not diaphoretic.  HENT:     Head: Normocephalic and atraumatic.     Mouth/Throat:     Mouth: Mucous membranes are moist.     Pharynx: Oropharynx is clear.  Eyes:     General: No  scleral icterus.    Extraocular Movements: Extraocular movements intact.     Conjunctiva/sclera: Conjunctivae normal.     Pupils: Pupils are equal, round, and reactive to light.  Cardiovascular:     Rate and Rhythm: Normal rate and regular rhythm.     Heart sounds: Normal heart sounds. No murmur heard.   Pulmonary:     Effort: Pulmonary effort is normal. No respiratory distress.     Breath sounds: Normal breath sounds. No wheezing or rales.  Chest:     Chest wall: No tenderness.  Breasts:     Right: No axillary adenopathy or supraclavicular adenopathy.     Left: No axillary adenopathy or supraclavicular adenopathy.    Abdominal:     General: Bowel sounds are normal. There is no distension.     Palpations: Abdomen is soft. There is no mass.     Tenderness: There is no abdominal tenderness. There is no guarding or rebound.  Musculoskeletal:        General: No swelling or tenderness. Normal range of motion.     Cervical back: Normal range of motion and neck supple.  Lymphadenopathy:     Head:     Right side of head: No preauricular, posterior auricular or occipital adenopathy.     Left side of head: No preauricular, posterior auricular or occipital adenopathy.     Cervical: No cervical adenopathy.     Upper Body:     Right upper body: No supraclavicular or axillary adenopathy.     Left upper body: No supraclavicular or axillary adenopathy.     Lower Body: No right inguinal adenopathy. No left inguinal adenopathy.  Skin:    General: Skin is warm and dry.  Neurological:     Mental Status: She is alert and oriented to person, place, and time.  Psychiatric:        Behavior: Behavior normal.        Thought Content: Thought content normal.        Judgment: Judgment normal.      No visits with results within 3 Day(s) from this visit.  Latest known visit with results is:  Appointment on 10/09/2019  Component Date Value Ref Range Status  . Folate 10/09/2019 12.7  >5.9 ng/mL  Final   Performed at [facility], [address] [place], [place] [address]  . WBC 10/09/2019 5.4  4.0 - 10.5  K/uL Final  . RBC 10/09/2019 4.04  3.87 - 5.11 MIL/uL Final  . Hemoglobin 10/09/2019 12.8  12.0 - 15.0 g/dL Final  . HCT 10/09/2019 38.2  36.0 - 46.0 % Final  . MCV 10/09/2019 94.6  80.0 - 100.0 fL Final  . MCH 10/09/2019 31.7  26.0 - 34.0 pg Final  . MCHC 10/09/2019 33.5  30.0 - 36.0 g/dL Final  . RDW 10/09/2019 13.2  11.5 - 15.5 % Final  . Platelets 10/09/2019 192  150 - 400 K/uL Final  . nRBC 10/09/2019 0.0  0.0 - 0.2 % Final  . Neutrophils Relative % 10/09/2019 56  % Final  . Neutro Abs 10/09/2019 3.1  1.7 - 7.7 K/uL Final  . Lymphocytes Relative 10/09/2019 28  % Final  . Lymphs Abs 10/09/2019 1.5  0.7 - 4.0 K/uL Final  . Monocytes Relative 10/09/2019 11  % Final  . Monocytes Absolute 10/09/2019 0.6  0.1 - 1.0 K/uL Final  . Eosinophils Relative 10/09/2019 3  % Final  . Eosinophils Absolute 10/09/2019 0.2  0.0 - 0.5 K/uL Final  . Basophils Relative 10/09/2019 1  % Final  . Basophils Absolute 10/09/2019 0.1  0.0 - 0.1 K/uL Final  . Immature Granulocytes 10/09/2019 1  % Final  . Abs Immature Granulocytes 10/09/2019 0.03  0.00 - 0.07 K/uL Final   Performed at [facility], [address], [place], [place] [address]  . Sodium 10/09/2019 138  135 - 145 mmol/L Final  . Potassium 10/09/2019 4.1  3.5 - 5.1 mmol/L Final  . Chloride 10/09/2019 103  98 - 111 mmol/L Final  . CO2 10/09/2019 27  22 - 32 mmol/L Final  . Glucose, Bld 10/09/2019 93  70 - 99 mg/dL Final   Glucose reference range applies only to samples taken after fasting for at least 8 hours.  . BUN 10/09/2019 25* 8 - 23 mg/dL Final  . Creatinine, Ser 10/09/2019 1.25* 0.44 - 1.00 mg/dL Final  . Calcium 10/09/2019 9.3  8.9 - 10.3 mg/dL Final  . Total Protein 10/09/2019 7.3  6.5 - 8.1 g/dL Final  . Albumin 10/09/2019 4.5  3.5 - 5.0 g/dL Final  . AST 10/09/2019 22  15 - 41 U/L Final  . ALT  10/09/2019 23  0 - 44 U/L Final  . Alkaline Phosphatase 10/09/2019 48  38 - 126 U/L Final  . Total Bilirubin 10/09/2019 0.5  0.3 - 1.2 mg/dL Final  . GFR calc non Af Amer 10/09/2019 41* >60 mL/min Final  . GFR calc Af Amer 10/09/2019 47* >60 mL/min Final  . Anion gap 10/09/2019 8  5 - 15 Final   Performed at [facility], [address], [place],  [address]  . CA 27.29 10/09/2019 10.2  0.0 - 38.6 U/mL Final   Comment: (NOTE) Siemens Centaur Immunochemiluminometric Methodology [facility]) Values obtained with different assay methods or kits cannot be used interchangeably. Results cannot be interpreted as absolute evidence of the presence or absence of malignant disease. Performed At: [facility] [place], [place] [id number] Rush Farmer MD ZW:[id number]     Assessment:  Allison Hall is a 80 y.o. female withmulti-focalright breast cancers/p partial mastectomy with sentinel lymph node biopsy on 09/29/2016. 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 Three sentinel lymph nodes were negative. Tumor was ER + (>90%), PR + (>90%) and Her2/neu 2+ (negative by FISH). Pathologic stage  was T1c(m) N0(sn). CA27.29was 13.4 on 09/06/2016.  MammaPrintrevealed a 29% risk of recurrence (high). She was seen for second opinion at [facility] on 10/25/2016. Chemotherapy was not recommended.  Bilateral screening mammogramon 07/24/2016 revealed a possible area of distortion in the right breast. Diagnostic mammogram on 08/10/2016 revealed 2 suspicious masses in the upper-outer quadrant of the right breast in a broad area of confluent distortion in the upper-outer quadrant of the right breast. There are at least 3 areas of confluent distortion spanning approximately 6.5 cm of tissue. Ultrasound on 07/31/2016 revealed an irregular hypoechoic 1.2 x 1.1 x 1.2 cm  at the 9:30 position 7 cm from the nipple. In addition, there was an irregular 1.9 x 0.8 x 1.2 cm mass at the 10 o'clock position 5 cm from the nipple. There were multiple small normal-appearing lymph nodes.   Breast MRIon 09/16/2016 revealed an irregular 1.6 x 1.2 x 1.6 cm enhancing mass adjacent to the clip artifact within the upper, outer right breast, middle depth, corresponding to the mass seen at 10 o'clock. There was an additional irregular enhancing mass adjacent to the more posterior clip artifact measuring 2.6 x 1.4 x 1.6 cm, corresponding to the mass seen at 9:30 position. Together, these 2 masses span 6.3 cm of breast tissue from anterior to posterior. There was no definite additional suspicious area of enhancement within the right breast to correspond to the third area of distortion identified on diagnostic mammogram dated 08/10/2016. Left breast revealed no suspicious mass or enhancement.  Bilateraldiagnostic mammogramon 09/03/2019 revealed no mammographic evidence of malignancy.   She received radiationfrom 11/15/2016 - 01/08/2017. She was Femara(01/17/2017 - 05/16/2017) secondary to arthralgias. She discontinued Arimidexafter 19 days (last 06/14/2017) secondary to fatigue and polyarthralgias. She beganAromasinon 06/27/2017.  CA27.29 has been followed: 13.4 on 09/06/2016, 13.0 on 05/16/2017, 17.1 on 08/29/2017, 12.1 on 06/04/2018, 13.8 on 10/01/2018, 15.6 on 02/04/2019, and 18.3 on 04/10/2019.  Bone density studyon 09/30/2015 revealed osteopeniawith a T-score of -2.1 in the right femoral neck. Bone density studyrevealed osteoporosison 07/09/2019witha T-score of -3.1. She began Proliaon 02/14/2017 (last 04/10/2019).  She was diagnosed with iron deficiency anemiaand B12 deficiencyon 08/29/2017. TSH and folate were normal. She is on oral B12 500 mcg a day. Intrinsic factor and anti-parietal cell antibodies were normal on 01/02/2018. B12 was 888 on  01/29/2018 and 544 on 02/04/2019.  Folate was 12.7 on 10/09/2019.   She received Venoferweekly x 3 (10/31/2017 - 11/13/2017)and x 4 (06/05/2018 - 06/26/2018).  Ferritinhas been followed: 10 on 08/29/2017, 174 on 12/12/2017, 74 on 01/02/2018, 64 on 01/29/2018, 3 on 06/04/2018, 205 on 10/01/2018, and 162 on 02/04/2019.  EGDand colonoscopyon 12/03/2017. Polyps were noted. Pathology was negative for dysplasia and malignancy.  Bilateral diagnostic mammogram on 09/02/2018 revealed no evidence of malignancy in either breast, s/p right lumpectomy.  Symptomatically, ***  Plan: 1.   Labs today: CBC with diff, CMP, CA27.29, B12, ferritin.   2.MultifocalRIGHTbreast cancer Clinically, she continues to do well Exam on 02/05/2019 revealed no evidence of recurrent disease.             Encourage self breast exams.  Mammogram on 09/02/2018 revealed no recurrent disease.  Schedule mammogram on 09/02/2019. Continue Aromasin.  RN to [employer] regarding dropping Aromasin to tier one (previously done by Aaron Edelman). 3.Iron deficiency anemia Hematocrit 36.9 Hemoglobin 12.3. MCV 99.5. Ferritin 162 on 02/04/2019. Patient off oral iron. Consider capsule study if ferritin drops to <100. Continue to monitor. 4.Osteoporosis Patient on calcium 1200 mg and vitamin D 800 IU  daily. Patient receives Prolia every 6 months(last 04/10/2019). Patient notes no dental concerns. Schedule bone density on 10/03/2019. 5.B12 deficiency B12 was544 on 02/04/2019. Patient decreased B12 from 1000 mcg/day to 500 mcg/day. B12 goal is >= 400. B12 level was 544 on 02/04/2019. Folate was 12.7 on 10/09/2019. 6.   RTC on 10/10/2019 for MD assessment, labs (CBC with diff, CMP, CA 27.29, folate), and Prolia.  I discussed the assessment and treatment plan with the patient.  The patient was provided an  opportunity to ask questions and all were answered.  The patient agreed with the plan and demonstrated an understanding of the instructions.  The patient was advised to call back if the symptoms worsen or if the condition fails to improve as anticipated.  I provided *** minutes of face-to-face time during this this encounter and > 50% was spent counseling as documented under my assessment and plan.  Lequita Asal, MD, PhD    04/12/2020, 11:58 AM  I, De Burrs, am acting as a Education administrator for Lequita Asal, MD.  I, [place] Mike Gip, MD, have reviewed the above documentation for accuracy and completeness, and I agree with the above.

## 2020-04-13 ENCOUNTER — Other Ambulatory Visit: Payer: Self-pay

## 2020-04-13 ENCOUNTER — Encounter: Payer: Self-pay | Admitting: Ophthalmology

## 2020-04-16 ENCOUNTER — Other Ambulatory Visit: Admission: RE | Admit: 2020-04-16 | Payer: Medicare HMO | Source: Ambulatory Visit

## 2020-04-16 NOTE — Discharge Instructions (Signed)

## 2020-04-20 ENCOUNTER — Ambulatory Visit: Payer: Medicare HMO | Admitting: Anesthesiology

## 2020-04-20 ENCOUNTER — Other Ambulatory Visit: Payer: Self-pay

## 2020-04-20 ENCOUNTER — Encounter
Admission: RE | Disposition: A | Discharge status: Home / Self Care | Payer: Self-pay | Source: Home / Self Care | Attending: Ophthalmology

## 2020-04-20 ENCOUNTER — Encounter: Payer: Self-pay | Admitting: Ophthalmology

## 2020-04-20 ENCOUNTER — Ambulatory Visit
Admission: RE | Admit: 2020-04-20 | Discharge: 2020-04-20 | Disposition: A | Discharge status: Home / Self Care | Payer: Medicare HMO | Attending: Ophthalmology | Admitting: Ophthalmology

## 2020-04-20 DIAGNOSIS — Z8616 Personal history of COVID-19: Secondary | ICD-10-CM | POA: Insufficient documentation

## 2020-04-20 DIAGNOSIS — H2511 Age-related nuclear cataract, right eye: Secondary | ICD-10-CM | POA: Insufficient documentation

## 2020-04-20 DIAGNOSIS — Z87891 Personal history of nicotine dependence: Secondary | ICD-10-CM | POA: Diagnosis not present

## 2020-04-20 DIAGNOSIS — Z96651 Presence of right artificial knee joint: Secondary | ICD-10-CM | POA: Insufficient documentation

## 2020-04-20 DIAGNOSIS — Z85828 Personal history of other malignant neoplasm of skin: Secondary | ICD-10-CM | POA: Insufficient documentation

## 2020-04-20 DIAGNOSIS — Z853 Personal history of malignant neoplasm of breast: Secondary | ICD-10-CM | POA: Diagnosis not present

## 2020-04-20 DIAGNOSIS — Z79899 Other long term (current) drug therapy: Secondary | ICD-10-CM | POA: Insufficient documentation

## 2020-04-20 HISTORY — PX: CATARACT EXTRACTION W/PHACO: SHX586

## 2020-04-20 SURGERY — PHACOEMULSIFICATION, CATARACT, WITH IOL INSERTION
Anesthesia: Monitor Anesthesia Care | Site: Eye | Laterality: Right

## 2020-04-20 MED ORDER — LIDOCAINE HCL (PF) 2 % IJ SOLN
INTRAOCULAR | Status: DC | PRN
  Administered 2020-04-20: 1 mL

## 2020-04-20 MED ORDER — MOXIFLOXACIN HCL 0.5 % OP SOLN
OPHTHALMIC | Status: DC | PRN
  Administered 2020-04-20: 0.2 mL via OPHTHALMIC

## 2020-04-20 MED ORDER — EPINEPHRINE PF 1 MG/ML IJ SOLN
INTRAOCULAR | Status: DC | PRN
  Administered 2020-04-20: 45 mL via OPHTHALMIC

## 2020-04-20 MED ORDER — BRIMONIDINE TARTRATE-TIMOLOL 0.2-0.5 % OP SOLN
OPHTHALMIC | Status: DC | PRN
  Administered 2020-04-20: 1 [drp] via OPHTHALMIC

## 2020-04-20 MED ORDER — FENTANYL CITRATE (PF) 100 MCG/2ML IJ SOLN
INTRAMUSCULAR | Status: DC | PRN
  Administered 2020-04-20: 50 ug via INTRAVENOUS

## 2020-04-20 MED ORDER — MIDAZOLAM HCL 2 MG/2ML IJ SOLN
INTRAMUSCULAR | Status: DC | PRN
  Administered 2020-04-20: 1 mg via INTRAVENOUS

## 2020-04-20 MED ORDER — ARMC OPHTHALMIC DILATING DROPS
1.0000 "application " | OPHTHALMIC | Status: DC | PRN
  Administered 2020-04-20 (×3): 1 via OPHTHALMIC

## 2020-04-20 MED ORDER — NA CHONDROIT SULF-NA HYALURON 40-17 MG/ML IO SOLN
INTRAOCULAR | Status: DC | PRN
  Administered 2020-04-20: 1 mL via INTRAOCULAR

## 2020-04-20 MED ORDER — TETRACAINE HCL 0.5 % OP SOLN
1.0000 [drp] | OPHTHALMIC | Status: DC | PRN
  Administered 2020-04-20 (×3): 1 [drp] via OPHTHALMIC

## 2020-04-20 SURGICAL SUPPLY — 21 items
CANNULA ANT/CHMB 27G (MISCELLANEOUS) ×2 IMPLANT
CANNULA ANT/CHMB 27GA (MISCELLANEOUS) ×4 IMPLANT
GLOVE SURG LX 8.0 MICRO (GLOVE) ×2
GLOVE SURG LX STRL 8.0 MICRO (GLOVE) ×1 IMPLANT
GLOVE SURG TRIUMPH 8.0 PF LTX (GLOVE) ×2 IMPLANT
GOWN STRL REUS W/ TWL LRG LVL3 (GOWN DISPOSABLE) ×2 IMPLANT
GOWN STRL REUS W/TWL LRG LVL3 (GOWN DISPOSABLE) ×4
LENS IOL TECNIS EYHANCE 19.5 (Intraocular Lens) ×1 IMPLANT
MARKER SKIN DUAL TIP RULER LAB (MISCELLANEOUS) ×2 IMPLANT
NDL FILTER BLUNT 18X1 1/2 (NEEDLE) ×1 IMPLANT
NEEDLE FILTER BLUNT 18X 1/2SAF (NEEDLE) ×1
NEEDLE FILTER BLUNT 18X1 1/2 (NEEDLE) ×1 IMPLANT
PACK EYE AFTER SURG (MISCELLANEOUS) ×2 IMPLANT
PACK OPTHALMIC (MISCELLANEOUS) ×2 IMPLANT
PACK PORFILIO (MISCELLANEOUS) ×2 IMPLANT
SUT ETHILON 10-0 CS-B-6CS-B-6 (SUTURE)
SUTURE EHLN 10-0 CS-B-6CS-B-6 (SUTURE) IMPLANT
SYR 3ML LL SCALE MARK (SYRINGE) ×2 IMPLANT
SYR TB 1ML LUER SLIP (SYRINGE) ×2 IMPLANT
WATER STERILE IRR 250ML POUR (IV SOLUTION) ×2 IMPLANT
WIPE NON LINTING 3.25X3.25 (MISCELLANEOUS) ×2 IMPLANT

## 2020-04-20 NOTE — Anesthesia Postprocedure Evaluation (Signed)
Anesthesia Post Note  Patient: Allison Hall  Procedure(s) Performed: CATARACT EXTRACTION PHACO AND INTRAOCULAR LENS PLACEMENT ([place]) RIGHT (Right Eye)     Patient location during evaluation: PACU Anesthesia Type: MAC Level of consciousness: awake Pain management: pain level controlled Vital Signs Assessment: post-procedure vital signs reviewed and stable Respiratory status: respiratory function stable Cardiovascular status: stable Postop Assessment: no apparent nausea or vomiting Anesthetic complications: no   No complications documented.  Veda Canning

## 2020-04-20 NOTE — Transfer of Care (Signed)
Immediate Anesthesia Transfer of Care Note  Patient: Allison Hall  Procedure(s) Performed: CATARACT EXTRACTION PHACO AND INTRAOCULAR LENS PLACEMENT ([facility]) RIGHT (Right Eye)  Patient Location: PACU  Anesthesia Type: MAC  Level of Consciousness: awake, alert  and patient cooperative  Airway and Oxygen Therapy: Patient Spontanous Breathing and Patient connected to supplemental oxygen  Post-op Assessment: Post-op Vital signs reviewed, Patient's Cardiovascular Status Stable, Respiratory Function Stable, Patent Airway and No signs of Nausea or vomiting  Post-op Vital Signs: Reviewed and stable  Complications: No complications documented.

## 2020-04-20 NOTE — Anesthesia Procedure Notes (Signed)
Procedure Name: MAC Date/Time: 04/20/2020 9:57 AM Performed by: Cameron Ali, CRNA Pre-anesthesia Checklist: Patient identified, Emergency Drugs available, Suction available, Timeout performed and Patient being monitored Patient Re-evaluated:Patient Re-evaluated prior to induction Oxygen Delivery Method: Nasal cannula Placement Confirmation: positive ETCO2

## 2020-04-20 NOTE — Anesthesia Preprocedure Evaluation (Signed)
Anesthesia Evaluation  Patient identified by MRN, date of birth, ID band Patient awake    Reviewed: Allergy & Precautions, NPO status   Airway Mallampati: II  TM Distance: >3 FB Neck ROM: Full    Dental no notable dental hx.    Pulmonary former smoker,    breath sounds clear to auscultation       Cardiovascular hypertension,  Rhythm:Regular Rate:Normal     Neuro/Psych PSYCHIATRIC DISORDERS Anxiety Depression    GI/Hepatic GERD  Controlled,  Endo/Other  BMI 35  Renal/GU      Musculoskeletal  (+) Arthritis ,   Abdominal (+) + obese,   Peds  Hematology   Anesthesia Other Findings   Reproductive/Obstetrics                             Anesthesia Physical  Anesthesia Plan  ASA: II  Anesthesia Plan: MAC   Post-op Pain Management:    Induction: Intravenous  PONV Risk Score and Plan: 2 and TIVA, Midazolam and Treatment may vary due to age or medical condition  Airway Management Planned: Natural Airway and Nasal Cannula  Additional Equipment:   Intra-op Plan:   Post-operative Plan:   Informed Consent: I have reviewed the patients History and Physical, chart, labs and discussed the procedure including the risks, benefits and alternatives for the proposed anesthesia with the patient or authorized representative who has indicated his/her understanding and acceptance.     Dental advisory given  Plan Discussed with: CRNA  Anesthesia Plan Comments:         Anesthesia Quick Evaluation

## 2020-04-20 NOTE — Op Note (Signed)
PREOPERATIVE DIAGNOSIS:  Nuclear sclerotic cataract of the right eye.   POSTOPERATIVE DIAGNOSIS:  Cataract   OPERATIVE PROCEDURE:@   SURGEON:  Birder Robson, MD.   ANESTHESIA:  Anesthesiologist: Veda Canning, MD CRNA: Cameron Ali, CRNA  1.      Managed anesthesia care. 2.      0.24ml of Shugarcaine was instilled in the eye following the paracentesis.   COMPLICATIONS:  None.   TECHNIQUE:   Stop and chop   DESCRIPTION OF PROCEDURE:  The patient was examined and consented in the preoperative holding area where the aforementioned topical anesthesia was applied to the right eye and then brought back to the Operating Room where the right eye was prepped and draped in the usual sterile ophthalmic fashion and a lid speculum was placed. A paracentesis was created with the side port blade and the anterior chamber was filled with viscoelastic. A near clear corneal incision was performed with the steel keratome. A continuous curvilinear capsulorrhexis was performed with a cystotome followed by the capsulorrhexis forceps. Hydrodissection and hydrodelineation were carried out with BSS on a blunt cannula. The lens was removed in a stop and chop  technique and the remaining cortical material was removed with the irrigation-aspiration handpiece. The capsular bag was inflated with viscoelastic and the Technis [id number]  lens was placed in the capsular bag without complication. The remaining viscoelastic was removed from the eye with the irrigation-aspiration handpiece. The wounds were hydrated. The anterior chamber was flushed with BSS and the eye was inflated to physiologic pressure. 0.41ml of Vigamox was placed in the anterior chamber. The wounds were found to be water tight. The eye was dressed with Combigan. The patient was given protective glasses to wear throughout the day and a shield with which to sleep tonight. The patient was also given drops with which to begin a drop regimen today and will follow-up  with me in one day. Implant Name Type Inv. Item Serial No. Manufacturer Lot No. LRB No. Used Action  LENS IOL TECNIS EYHANCE 19.5 - [id number] Intraocular Lens LENS IOL TECNIS EYHANCE 19.5 [id number] JOHNSON   Right 1 Implanted   Procedure(s) with comments: CATARACT EXTRACTION PHACO AND INTRAOCULAR LENS PLACEMENT ([facility]) RIGHT (Right) - 7.26 0:47.2  Electronically signed: Birder Robson 04/20/2020 10:15 AM

## 2020-04-20 NOTE — H&P (Signed)
[facility]   Primary Care Physician:  Ethelda Chick, MD Ophthalmologist: Dr. Druscilla Brownie  Pre-Procedure History & Physical: HPI:  Allison Hall is a 80 y.o. female here for cataract surgery.   Past Medical History:  Diagnosis Date  . Anemia    in past  . Anxiety   . Breast cancer (HCC) 08/24/2016   right breast UOQ  . Cancer (HCC)    skin ca  . Colon polyp   . COVID-19 02/14/2020   received treatment  . Depression   . GERD (gastroesophageal reflux disease)   . Hypercholesteremia   . Hypertension   . Osteoarthritis   . Personal history of radiation therapy 2018   RIGHT BREAST CA UOQ  . Scoliosis of lumbar spine   . Thyroid nodule 04/17/2016    Past Surgical History:  Procedure Laterality Date  . ABDOMINAL HYSTERECTOMY    . APPENDECTOMY    . BREAST BIOPSY Right 08/24/2016   9:30 - invasive mammary carcinoma with lobular features  . BREAST BIOPSY Right 08/24/2016   10:00 - invasive mammary carcinoma with lobular features  . BREAST LUMPECTOMY Right 10/09/2016   INVASIVE CARCINOMA, TUBULO-LOBULAR VARIANT, CLEAR MARGINS, NEGATIVE LN'S  . CATARACT EXTRACTION W/PHACO Left 04/06/2020   Procedure: CATARACT EXTRACTION PHACO AND INTRAOCULAR LENS PLACEMENT (IOC) LEFT;  Surgeon: Galen Manila, MD;  Location: [facility];  Service: Ophthalmology;  Laterality: Left;  5.49 0:33.9  . COLONOSCOPY W/ POLYPECTOMY  2003, 2008, 2014  . COLONOSCOPY WITH PROPOFOL N/A 12/03/2017   Procedure: COLONOSCOPY WITH PROPOFOL;  Surgeon: Scot Jun, MD;  Location: [facility] ENDOSCOPY;  Service: Endoscopy;  Laterality: N/A;  . ESOPHAGOGASTRODUODENOSCOPY     foreign body  . ESOPHAGOGASTRODUODENOSCOPY (EGD) WITH PROPOFOL N/A 12/03/2017   Procedure: ESOPHAGOGASTRODUODENOSCOPY (EGD) WITH PROPOFOL;  Surgeon: Scot Jun, MD;  Location: [facility] ENDOSCOPY;  Service: Endoscopy;  Laterality: N/A;  . JOINT REPLACEMENT Right 2008   TKR  . NOSE SURGERY    . PARTIAL MASTECTOMY WITH  NEEDLE LOCALIZATION Right 09/29/2016   Procedure: PARTIAL MASTECTOMY WITH NEEDLE LOCALIZATION;  Surgeon: Nadeen Landau, MD;  Location: [facility] ORS;  Service: General;  Laterality: Right;  . REPLACEMENT TOTAL KNEE Right 2008  . SENTINEL NODE BIOPSY Right 09/29/2016   Procedure: SENTINEL NODE BIOPSY;  Surgeon: Nadeen Landau, MD;  Location: [facility] ORS;  Service: General;  Laterality: Right;    Prior to Admission medications   Medication Sig Start Date End Date Taking? Authorizing Provider  acetaminophen (TYLENOL) 500 MG tablet Take 650 mg by mouth every 6 (six) hours as needed.    Yes [provider]  amLODipine (NORVASC) 10 MG tablet TAKE 1 TABLET BY MOUTH EVERY DAY 09/10/18  Yes [provider]  buPROPion (WELLBUTRIN XL) 300 MG 24 hr tablet Take by mouth. 12/25/17  Yes [provider]  busPIRone (BUSPAR) 10 MG tablet Take 10 mg by mouth 3 (three) times daily. 04/17/16  Yes [provider]  calcium carbonate 1250 MG capsule Take 1,250 mg by mouth daily.    Yes [provider]  Cholecalciferol (VITAMIN D3) 1000 units CAPS Take 2,000 Units by mouth 2 (two) times daily.    Yes [provider]  exemestane (AROMASIN) 25 MG tablet TAKE 1 TABLET (25 MG TOTAL) BY MOUTH DAILY AFTER BREAKFAST. 10/26/19  Yes Corcoran, Melissa C, MD  lisinopril (ZESTRIL) 40 MG tablet Take 40 mg by mouth daily. 11/24/18  Yes [provider]  loperamide (IMODIUM) 2 MG capsule Take 2 mg  by mouth as needed for diarrhea or loose stools.    Yes [provider]  Magnesium 250 MG TABS Take 500 mg by mouth daily.    Yes [provider]  Multiple Vitamins-Minerals (PRESERVISION AREDS PO) Take 1 capsule by mouth 2 (two) times daily.   Yes [provider]  omeprazole (PRILOSEC) 40 MG capsule Take 40 mg by mouth daily.  10/20/14 04/20/20 Yes [provider]  rosuvastatin (CRESTOR) 5 MG tablet Take 5 mg by mouth daily. 04/17/16 04/20/20 Yes  [provider]  Turmeric Curcumin 500 MG CAPS Take 1,500 mg by mouth daily.    Yes [provider]  vitamin B-12 (CYANOCOBALAMIN) 500 MCG tablet Take 1,000 mcg by mouth every other day.    Yes [provider]  denosumab (PROLIA) 60 MG/ML SOLN injection Inject 60 mg into the skin every 6 (six) months. Administer in upper arm, thigh, or abdomen    [provider]  sertraline (ZOLOFT) 100 MG tablet Take 1.5 tablets (150 mg total) by mouth daily. Patient taking differently: Take 100 mg by mouth daily. 05/19/15 02/08/20  Elvin So, MD    Allergies as of 02/13/2020 - Review Complete 02/08/2020  Allergen Reaction Noted  . Sulfamethoxazole Other (See Comments) 05/19/2015  . Other Itching 02/04/2019  . Statins Other (See Comments) 10/11/2011  . Trazodone Other (See Comments) 08/16/2015  . Ciprofloxacin Anxiety 09/21/2016  . Sulfa antibiotics Anxiety 11/29/2014    Family History  Problem Relation Age of Onset  . Hypertension Mother   . Depression Mother   . Heart failure Father   . Obesity Brother   . Heart disease Brother   . Stroke Brother   . Depression Brother   . Breast cancer Cousin   . Breast cancer Cousin   . Breast cancer Cousin     Social History   Socioeconomic History  . Marital status: Married    Spouse name: Not on file  . Number of children: Not on file  . Years of education: Not on file  . Highest education level: Not on file  Occupational History  . Not on file  Tobacco Use  . Smoking status: Former Smoker    Packs/day: 1.00    Years: 10.00    Pack years: 10.00    Quit date: 05/18/1965    Years since quitting: 54.9  . Smokeless tobacco: Never Used  Vaping Use  . Vaping Use: Never used  Substance and Sexual Activity  . Alcohol use: Yes    Alcohol/week: 7.0 standard drinks    Types: 7 Glasses of wine per week  . Drug use: No  . Sexual activity: Not Currently  Other Topics Concern  . Not on file  Social History  Narrative  . Not on file   Social Determinants of Health   Financial Resource Strain: Not on file  Food Insecurity: Not on file  Transportation Needs: Not on file  Physical Activity: Not on file  Stress: Not on file  Social Connections: Not on file  Intimate Partner Violence: Not on file    Review of Systems: See HPI, otherwise negative ROS  Physical Exam: BP 134/82   Pulse 88   Temp (!) 97.1 F (36.2 C) (Temporal)   Ht 5\' 4"  (1.626 m)   Wt 95.3 kg   SpO2 97%   BMI 36.05 kg/m  General:   Alert,  pleasant and cooperative in NAD Head:  Normocephalic and atraumatic. Respiratory:  Normal work of breathing.  Impression/Plan:  [name] is here for cataract surgery.  Risks, benefits, limitations, and alternatives regarding cataract surgery have been reviewed with the patient.  Questions have been answered.  All parties agreeable.   Birder Robson, MD  04/20/2020, 9:48 AM

## 2020-04-21 ENCOUNTER — Encounter: Payer: Self-pay | Admitting: Ophthalmology

## 2020-04-22 NOTE — Progress Notes (Incomplete)
[facility]  [address] [place], [place] [address] Phone: [phone]  Fax: [phone]  Clinic Day:  04/22/2020  Referring physician: Wardell Honour, MD  Chief Complaint: [facility] Hall is a 80 y.o. female with multi-focal right breast cancer, osteoporosis, iron deficiency anemia,and B12 deficiency who is seen for 6 month assessment   HPI: The patient was last seen in the medical oncology clinic on 10/09/2019 by Allison Rutter, NP. At that time, she was doing well. She voiced no concerns. Hematocrit was 38.2, hemoglobin 12.8, platelets 192,000, WBC 5,400. Creatinine was 1.25 (CrCl 41 ml/min). Folate was 12.7. CA27.29 was 10.2. She received Prolia. She continued Aromasin, vitamin B12, calcium, and vitamin D.  The patient was seen at [facility] on 02/08/2020 for congestion and cough x 2 days. She was not vaccinated for COVID-19 and declined COVID-19 testing. She was advised to do saline rinses for post-nasal drainage.  The patient underwent left cataract extraction on 04/06/2020 and right cataract extraction on 04/20/2020 by Dr. George Hall.  During the interim, ***   Past Medical History:  Diagnosis Date  . Anemia    in past  . Anxiety   . Breast cancer ([place]) 08/24/2016   right breast UOQ  . Cancer ([place])    skin ca  . Colon polyp   . COVID-19 02/14/2020   received treatment  . Depression   . GERD (gastroesophageal reflux disease)   . Hypercholesteremia   . Hypertension   . Osteoarthritis   . Personal history of radiation therapy 2018   RIGHT BREAST CA UOQ  . Scoliosis of lumbar spine   . Thyroid nodule 04/17/2016    Past Surgical History:  Procedure Laterality Date  . ABDOMINAL HYSTERECTOMY    . APPENDECTOMY    . BREAST BIOPSY Right 08/24/2016   9:30 - invasive mammary carcinoma with lobular features  . BREAST BIOPSY Right 08/24/2016   10:00 - invasive mammary carcinoma with lobular features  . BREAST LUMPECTOMY  Right 10/09/2016   INVASIVE CARCINOMA, TUBULO-LOBULAR VARIANT, CLEAR MARGINS, NEGATIVE LN'S  . CATARACT EXTRACTION W/PHACO Left 04/06/2020   Procedure: CATARACT EXTRACTION PHACO AND INTRAOCULAR LENS PLACEMENT ([place]) LEFT;  Surgeon: Birder Robson, MD;  Location: [facility];  Service: Ophthalmology;  Laterality: Left;  5.49 0:33.9  . CATARACT EXTRACTION W/PHACO Right 04/20/2020   Procedure: CATARACT EXTRACTION PHACO AND INTRAOCULAR LENS PLACEMENT ([place]) RIGHT;  Surgeon: Birder Robson, MD;  Location: [facility];  Service: Ophthalmology;  Laterality: Right;  7.26 0:47.2  . COLONOSCOPY W/ POLYPECTOMY  2003, 2008, 2014  . COLONOSCOPY WITH PROPOFOL N/A 12/03/2017   Procedure: COLONOSCOPY WITH PROPOFOL;  Surgeon: Manya Silvas, MD;  Location: [facility] ENDOSCOPY;  Service: Endoscopy;  Laterality: N/A;  . ESOPHAGOGASTRODUODENOSCOPY     foreign body  . ESOPHAGOGASTRODUODENOSCOPY (EGD) WITH PROPOFOL N/A 12/03/2017   Procedure: ESOPHAGOGASTRODUODENOSCOPY (EGD) WITH PROPOFOL;  Surgeon: Manya Silvas, MD;  Location: [facility] ENDOSCOPY;  Service: Endoscopy;  Laterality: N/A;  . JOINT REPLACEMENT Right 2008   TKR  . NOSE SURGERY    . PARTIAL MASTECTOMY WITH NEEDLE LOCALIZATION Right 09/29/2016   Procedure: PARTIAL MASTECTOMY WITH NEEDLE LOCALIZATION;  Surgeon: [name], MD;  Location: [facility] ORS;  Service: General;  Laterality: Right;  . REPLACEMENT TOTAL KNEE Right 2008  . SENTINEL NODE BIOPSY Right 09/29/2016   Procedure: SENTINEL NODE BIOPSY;  Surgeon: [name], MD;  Location: [facility] ORS;  Service: General;  Laterality: Right;    Family History  Problem Relation Age of Onset  . Hypertension Mother   . Depression Mother   . Heart failure Father   . Obesity Brother   . Heart disease Brother   . Stroke Brother   . Depression Brother   . Breast cancer Cousin   . Breast cancer Cousin   . Breast cancer Cousin     Social History:  reports that she quit smoking  about 54 years ago. She has a 10.00 pack-year smoking history. She has never used smokeless tobacco. She reports current alcohol use of about 7.0 standard drinks of alcohol per week. She reports that she does not use drugs. She previously smoked 1/2 pack/day x 10 years. She stopped smoking on 10/11/1978. She drinks 6 glasses of wine/week. She has 3 children (2 sons and 1 daughter). She is a retired Clinical cytogeneticist for [employer]. She likes to do crossword puzzles. Her daughter's name is [name]. Her husband is in an assisted living facility [facility]) secondary to dementia. She lives in [place] The patient is alone*** today.  Allergies:  Allergies  Allergen Reactions  . Sulfamethoxazole Other (See Comments)    Anxiety and imsomnia  . Other Itching    Vit C-vit E-copper-zinc-lutein - itching and fatigue (per [facility])  . Statins Other (See Comments)    Muscle pain   . Trazodone Other (See Comments)    Pt states she felt "unsteady"   . Ciprofloxacin Anxiety  . Sulfa Antibiotics Anxiety    Current Medications: Current Outpatient Medications  Medication Sig Dispense Refill  . acetaminophen (TYLENOL) 500 MG tablet Take 650 mg by mouth every 6 (six) hours as needed.     Marland Kitchen amLODipine (NORVASC) 10 MG tablet TAKE 1 TABLET BY MOUTH EVERY DAY    . buPROPion (WELLBUTRIN XL) 300 MG 24 hr tablet Take by mouth.    . busPIRone (BUSPAR) 10 MG tablet Take 10 mg by mouth 3 (three) times daily.    . calcium carbonate 1250 MG capsule Take 1,250 mg by mouth daily.     . Cholecalciferol (VITAMIN D3) 1000 units CAPS Take 2,000 Units by mouth 2 (two) times daily.     Marland Kitchen denosumab (PROLIA) 60 MG/ML SOLN injection Inject 60 mg into the skin every 6 (six) months. Administer in upper arm, thigh, or abdomen    . exemestane (AROMASIN) 25 MG tablet TAKE 1 TABLET (25 MG TOTAL) BY MOUTH DAILY AFTER BREAKFAST. 90 tablet 3  . lisinopril (ZESTRIL) 40 MG tablet Take 40 mg by mouth daily.    Marland Kitchen loperamide (IMODIUM) 2 MG capsule  Take 2 mg by mouth as needed for diarrhea or loose stools.     . Magnesium 250 MG TABS Take 500 mg by mouth daily.     . Multiple Vitamins-Minerals (PRESERVISION AREDS PO) Take 1 capsule by mouth 2 (two) times daily.    Marland Kitchen omeprazole (PRILOSEC) 40 MG capsule Take 40 mg by mouth daily.     . rosuvastatin (CRESTOR) 5 MG tablet Take 5 mg by mouth daily.    . sertraline (ZOLOFT) 100 MG tablet Take 1.5 tablets (150 mg total) by mouth daily. (Patient taking differently: Take 100 mg by mouth daily.) 45 tablet 1  . Turmeric Curcumin 500 MG CAPS Take 1,500 mg by mouth daily.     . vitamin B-12 (CYANOCOBALAMIN) 500 MCG tablet Take 1,000 mcg by mouth every other day.      No current facility-administered medications for this visit.   Review of Systems  Constitutional: Negative  for chills, diaphoresis, fever, malaise/fatigue and weight loss.  HENT: Negative for congestion, ear discharge, ear pain, hearing loss, nosebleeds, sinus pain, sore throat and tinnitus.   Eyes: Negative for blurred vision.  Respiratory: Negative for cough, hemoptysis, sputum production and shortness of breath.   Cardiovascular: Negative for chest pain, palpitations and leg swelling.  Gastrointestinal: Negative for abdominal pain, blood in stool, constipation, diarrhea, heartburn, melena, nausea and vomiting.  Genitourinary: Negative for dysuria, frequency, hematuria and urgency.  Musculoskeletal: Negative for back pain, joint pain, myalgias and neck pain.  Skin: Negative for itching and rash.  Neurological: Negative for dizziness, tingling, sensory change, weakness and headaches.  Endo/Heme/Allergies: Does not bruise/bleed easily.  Psychiatric/Behavioral: Negative for depression and memory loss. The patient is not nervous/anxious and does not have insomnia.   All other systems reviewed and are negative.  Performance status (ECOG): 1***  Vitals There were no vitals taken for this visit.   Physical Exam Vitals and nursing  note reviewed.  Constitutional:      General: She is not in acute distress.    Appearance: She is not diaphoretic.  HENT:     Head: Normocephalic and atraumatic.     Mouth/Throat:     Mouth: Mucous membranes are moist.     Pharynx: Oropharynx is clear.  Eyes:     General: No scleral icterus.    Extraocular Movements: Extraocular movements intact.     Conjunctiva/sclera: Conjunctivae normal.     Pupils: Pupils are equal, round, and reactive to light.  Cardiovascular:     Rate and Rhythm: Normal rate and regular rhythm.     Heart sounds: Normal heart sounds. No murmur heard.   Pulmonary:     Effort: Pulmonary effort is normal. No respiratory distress.     Breath sounds: Normal breath sounds. No wheezing or rales.  Chest:     Chest wall: No tenderness.  Breasts:     Right: No axillary adenopathy or supraclavicular adenopathy.     Left: No axillary adenopathy or supraclavicular adenopathy.    Abdominal:     General: Bowel sounds are normal. There is no distension.     Palpations: Abdomen is soft. There is no mass.     Tenderness: There is no abdominal tenderness. There is no guarding or rebound.  Musculoskeletal:        General: No swelling or tenderness. Normal range of motion.     Cervical back: Normal range of motion and neck supple.  Lymphadenopathy:     Head:     Right side of head: No preauricular, posterior auricular or occipital adenopathy.     Left side of head: No preauricular, posterior auricular or occipital adenopathy.     Cervical: No cervical adenopathy.     Upper Body:     Right upper body: No supraclavicular or axillary adenopathy.     Left upper body: No supraclavicular or axillary adenopathy.     Lower Body: No right inguinal adenopathy. No left inguinal adenopathy.  Skin:    General: Skin is warm and dry.  Neurological:     Mental Status: She is alert and oriented to person, place, and time.  Psychiatric:        Behavior: Behavior normal.         Thought Content: Thought content normal.        Judgment: Judgment normal.      No visits with results within 3 Day(s) from this visit.  Latest known visit with results is:  Appointment on 10/09/2019  Component Date Value Ref Range Status  . Folate 10/09/2019 12.7  >5.9 ng/mL Final   Performed at [facility], [address] [place], [place] [address]  . WBC 10/09/2019 5.4  4.0 - 10.5 K/uL Final  . RBC 10/09/2019 4.04  3.87 - 5.11 MIL/uL Final  . Hemoglobin 10/09/2019 12.8  12.0 - 15.0 g/dL Final  . HCT 10/09/2019 38.2  36.0 - 46.0 % Final  . MCV 10/09/2019 94.6  80.0 - 100.0 fL Final  . MCH 10/09/2019 31.7  26.0 - 34.0 pg Final  . MCHC 10/09/2019 33.5  30.0 - 36.0 g/dL Final  . RDW 10/09/2019 13.2  11.5 - 15.5 % Final  . Platelets 10/09/2019 192  150 - 400 K/uL Final  . nRBC 10/09/2019 0.0  0.0 - 0.2 % Final  . Neutrophils Relative % 10/09/2019 56  % Final  . Neutro Abs 10/09/2019 3.1  1.7 - 7.7 K/uL Final  . Lymphocytes Relative 10/09/2019 28  % Final  . Lymphs Abs 10/09/2019 1.5  0.7 - 4.0 K/uL Final  . Monocytes Relative 10/09/2019 11  % Final  . Monocytes Absolute 10/09/2019 0.6  0.1 - 1.0 K/uL Final  . Eosinophils Relative 10/09/2019 3  % Final  . Eosinophils Absolute 10/09/2019 0.2  0.0 - 0.5 K/uL Final  . Basophils Relative 10/09/2019 1  % Final  . Basophils Absolute 10/09/2019 0.1  0.0 - 0.1 K/uL Final  . Immature Granulocytes 10/09/2019 1  % Final  . Abs Immature Granulocytes 10/09/2019 0.03  0.00 - 0.07 K/uL Final   Performed at [facility], [address], [place], [place] [address]  . Sodium 10/09/2019 138  135 - 145 mmol/L Final  . Potassium 10/09/2019 4.1  3.5 - 5.1 mmol/L Final  . Chloride 10/09/2019 103  98 - 111 mmol/L Final  . CO2 10/09/2019 27  22 - 32 mmol/L Final  . Glucose, Bld 10/09/2019 93  70 - 99 mg/dL Final   Glucose reference range applies only to samples taken after fasting for at least 8 hours.  . BUN 10/09/2019 25* 8 -  23 mg/dL Final  . Creatinine, Ser 10/09/2019 1.25* 0.44 - 1.00 mg/dL Final  . Calcium 10/09/2019 9.3  8.9 - 10.3 mg/dL Final  . Total Protein 10/09/2019 7.3  6.5 - 8.1 g/dL Final  . Albumin 10/09/2019 4.5  3.5 - 5.0 g/dL Final  . AST 10/09/2019 22  15 - 41 U/L Final  . ALT 10/09/2019 23  0 - 44 U/L Final  . Alkaline Phosphatase 10/09/2019 48  38 - 126 U/L Final  . Total Bilirubin 10/09/2019 0.5  0.3 - 1.2 mg/dL Final  . GFR calc non Af Amer 10/09/2019 41* >60 mL/min Final  . GFR calc Af Amer 10/09/2019 47* >60 mL/min Final  . Anion gap 10/09/2019 8  5 - 15 Final   Performed at [facility], [address] [place], [place] [address]  . CA 27.29 10/09/2019 10.2  0.0 - 38.6 U/mL Final   Comment: (NOTE) Siemens Centaur Immunochemiluminometric Methodology [facility]) Values obtained with different assay methods or kits cannot be used interchangeably. Results cannot be interpreted as absolute evidence of the presence or absence of malignant disease. Performed At: [facility] [place], [place] [id number] Rush Farmer MD GE:[id number]     Assessment:  [facility] is a 80 y.o. female withmulti-focalright breast cancers/p partial mastectomy with sentinel lymph node biopsy on 09/29/2016. Pathology  revealed a 1.5 cm grade I invasive carcinoma, tubulo-lobular variant and a 1.6 cm grade I invasive carcinoma, tubulo-lobular variant. Three sentinel lymph nodes were negative. Tumor was ER + (>90%), PR + (>90%) and Her2/neu 2+ (negative by FISH). Pathologic stage was T1c(m) N0(sn). CA27.29was 13.4 on 09/06/2016.  MammaPrintrevealed a 29% risk of recurrence (high). She was seen for second opinion at [facility] on 10/25/2016. Chemotherapy was not recommended.  Bilateral screening mammogramon 07/24/2016 revealed a possible area of distortion in the right breast. Diagnostic mammogram on 08/10/2016 revealed 2 suspicious masses in the upper-outer quadrant  of the right breast in a broad area of confluent distortion in the upper-outer quadrant of the right breast. There are at least 3 areas of confluent distortion spanning approximately 6.5 cm of tissue. Ultrasound on 07/31/2016 revealed an irregular hypoechoic 1.2 x 1.1 x 1.2 cm at the 9:30 position 7 cm from the nipple. In addition, there was an irregular 1.9 x 0.8 x 1.2 cm mass at the 10 o'clock position 5 cm from the nipple. There were multiple small normal-appearing lymph nodes.   Breast MRIon 09/16/2016 revealed an irregular 1.6 x 1.2 x 1.6 cm enhancing mass adjacent to the clip artifact within the upper, outer right breast, middle depth, corresponding to the mass seen at 10 o'clock. There was an additional irregular enhancing mass adjacent to the more posterior clip artifact measuring 2.6 x 1.4 x 1.6 cm, corresponding to the mass seen at 9:30 position. Together, these 2 masses span 6.3 cm of breast tissue from anterior to posterior. There was no definite additional suspicious area of enhancement within the right breast to correspond to the third area of distortion identified on diagnostic mammogram dated 08/10/2016. Left breast revealed no suspicious mass or enhancement.  Bilateraldiagnostic mammogramon 09/03/2019 revealed no mammographic evidence of malignancy.   She received radiationfrom 11/15/2016 - 01/08/2017. She was Femara(01/17/2017 - 05/16/2017) secondary to arthralgias. She discontinued Arimidexafter 19 days (last 06/14/2017) secondary to fatigue and polyarthralgias. She beganAromasinon 06/27/2017.  CA27.29 has been followed: 13.4 on 09/06/2016, 13.0 on 05/16/2017, 17.1 on 08/29/2017, 12.1 on 06/04/2018, 13.8 on 10/01/2018, 15.6 on 02/04/2019, and 18.3 on 04/10/2019.  Bone density studyon 09/30/2015 revealed osteopeniawith a T-score of -2.1 in the right femoral neck. Bone density studyrevealed osteoporosison 07/09/2019witha T-score of -3.1. She began Proliaon  02/14/2017 (last 04/10/2019).  She was diagnosed with iron deficiency anemiaand B12 deficiencyon 08/29/2017. TSH and folate were normal. She is on oral B12 500 mcg a day. Intrinsic factor and anti-parietal cell antibodies were normal on 01/02/2018. B12 was 888 on 01/29/2018 and 544 on 02/04/2019.  Folate was 12.7 on 10/09/2019.   She received Venoferweekly x 3 (10/31/2017 - 11/13/2017)and x 4 (06/05/2018 - 06/26/2018).  Ferritinhas been followed: 10 on 08/29/2017, 174 on 12/12/2017, 74 on 01/02/2018, 64 on 01/29/2018, 3 on 06/04/2018, 205 on 10/01/2018, and 162 on 02/04/2019.  EGDand colonoscopyon 12/03/2017. Polyps were noted. Pathology was negative for dysplasia and malignancy.  Bilateral diagnostic mammogram on 09/02/2018 revealed no evidence of malignancy in either breast, s/p right lumpectomy.  Symptomatically, ***  Plan: 1.   Labs today: CBC with diff, CMP, CA27.29, B12, ferritin.   2.MultifocalRIGHTbreast cancer Clinically, she continues to do well Exam on 02/05/2019 revealed no evidence of recurrent disease.             Encourage self breast exams.  Mammogram on 09/02/2018 revealed no recurrent disease.  Schedule mammogram on 09/02/2019. Continue Aromasin.  RN to [employer] regarding dropping Aromasin to tier one (previously  done by Aaron Edelman). 3.Iron deficiency anemia Hematocrit 36.9 Hemoglobin 12.3. MCV 99.5. Ferritin 162 on 02/04/2019. Patient off oral iron. Consider capsule study if ferritin drops to <100. Continue to monitor. 4.Osteoporosis Patient on calcium 1200 mg and vitamin D 800 IU daily. Patient receives Prolia every 6 months(last 04/10/2019). Patient notes no dental concerns. Schedule bone density on 10/03/2019. 5.B12 deficiency B12 was544 on 02/04/2019. Patient decreased B12 from 1000 mcg/day to 500 mcg/day. B12 goal is >=  400. B12 level was 544 on 02/04/2019. Folate was 12.7 on 10/09/2019. 6.   RTC on 10/10/2019 for MD assessment, labs (CBC with diff, CMP, CA 27.29, folate), and Prolia.  I discussed the assessment and treatment plan with the patient.  The patient was provided an opportunity to ask questions and all were answered.  The patient agreed with the plan and demonstrated an understanding of the instructions.  The patient was advised to call back if the symptoms worsen or if the condition fails to improve as anticipated.  I provided *** minutes of face-to-face time during this this encounter and > 50% was spent counseling as documented under my assessment and plan.  Lequita Asal, MD, PhD 04/22/2020, 9:44 AM  I, De Burrs, am acting as a Education administrator for Lequita Asal, MD.  I, [place] Mike Gip, MD, have reviewed the above documentation for accuracy and completeness, and I agree with the above.

## 2020-04-26 ENCOUNTER — Ambulatory Visit: Payer: Medicare HMO | Admitting: Hematology and Oncology

## 2020-04-26 ENCOUNTER — Other Ambulatory Visit: Payer: Medicare HMO

## 2020-04-26 ENCOUNTER — Ambulatory Visit: Payer: Medicare HMO

## 2020-05-03 NOTE — Progress Notes (Signed)
[facility]  [address], [address] [place], [place] [address] Phone: [phone]  Fax: [phone]   Clinic Day:  05/04/2020  Referring physician: Wardell Honour, MD  Chief Complaint: Allison Hall is a 80 y.o. female with multi-focal right breast cancer, osteoporosis, iron deficiency anemia, and B12 deficiency who is seen for 6 month assessment.  HPI: The patient was last seen in the medical oncology clinic on 10/09/2019. At that time, she was doing well.  She voiced no concerns.  Exam was stable. Hematocrit was 38.2, hemoglobin 12.8, platelets 192,000, WBC 5,400. Creatinine was 1.25 (CrCl 41 ml/min). CA27.29 was 10.2. Folate 12.7. She continued Aromasin, calcium, and vitamin D. She continued vitamin B12 500 mcg daily. She received Prolia.  She tested positive for COVID-19 on 02/14/2020.  She underwent left cataract extraction on 04/06/2020 and right cataract extraction on 04/20/2020 by Dr. George Ina.  During the interim, she has felt "well".  He denies any breast concerns.  She notes hair thinning.  She often finds excess hair on her comb.  She denies any dental concerns.    Past Medical History:  Diagnosis Date  . Anemia    in past  . Anxiety   . Breast cancer ([place]) 08/24/2016   right breast UOQ  . Cancer ([place])    skin ca  . Colon polyp   . COVID-19 02/14/2020   received treatment  . Depression   . GERD (gastroesophageal reflux disease)   . Hypercholesteremia   . Hypertension   . Osteoarthritis   . Personal history of radiation therapy 2018   RIGHT BREAST CA UOQ  . Scoliosis of lumbar spine   . Thyroid nodule 04/17/2016    Past Surgical History:  Procedure Laterality Date  . ABDOMINAL HYSTERECTOMY    . APPENDECTOMY    . BREAST BIOPSY Right 08/24/2016   9:30 - invasive mammary carcinoma with lobular features  . BREAST BIOPSY Right 08/24/2016   10:00 - invasive mammary carcinoma with lobular features  . BREAST LUMPECTOMY Right  10/09/2016   INVASIVE CARCINOMA, TUBULO-LOBULAR VARIANT, CLEAR MARGINS, NEGATIVE LN'S  . CATARACT EXTRACTION W/PHACO Left 04/06/2020   Procedure: CATARACT EXTRACTION PHACO AND INTRAOCULAR LENS PLACEMENT ([place]) LEFT;  Surgeon: Birder Robson, MD;  Location: [facility];  Service: Ophthalmology;  Laterality: Left;  5.49 0:33.9  . CATARACT EXTRACTION W/PHACO Right 04/20/2020   Procedure: CATARACT EXTRACTION PHACO AND INTRAOCULAR LENS PLACEMENT ([place]) RIGHT;  Surgeon: Birder Robson, MD;  Location: [facility];  Service: Ophthalmology;  Laterality: Right;  7.26 0:47.2  . COLONOSCOPY W/ POLYPECTOMY  2003, 2008, 2014  . COLONOSCOPY WITH PROPOFOL N/A 12/03/2017   Procedure: COLONOSCOPY WITH PROPOFOL;  Surgeon: Manya Silvas, MD;  Location: [facility] ENDOSCOPY;  Service: Endoscopy;  Laterality: N/A;  . ESOPHAGOGASTRODUODENOSCOPY     foreign body  . ESOPHAGOGASTRODUODENOSCOPY (EGD) WITH PROPOFOL N/A 12/03/2017   Procedure: ESOPHAGOGASTRODUODENOSCOPY (EGD) WITH PROPOFOL;  Surgeon: Manya Silvas, MD;  Location: [facility] ENDOSCOPY;  Service: Endoscopy;  Laterality: N/A;  . JOINT REPLACEMENT Right 2008   TKR  . NOSE SURGERY    . PARTIAL MASTECTOMY WITH NEEDLE LOCALIZATION Right 09/29/2016   Procedure: PARTIAL MASTECTOMY WITH NEEDLE LOCALIZATION;  Surgeon: [name], MD;  Location: [facility] ORS;  Service: General;  Laterality: Right;  . REPLACEMENT TOTAL KNEE Right 2008  . SENTINEL NODE BIOPSY Right 09/29/2016   Procedure: SENTINEL NODE BIOPSY;  Surgeon: [name], MD;  Location: [facility] ORS;  Service: General;  Laterality: Right;  Family History  Problem Relation Age of Onset  . Hypertension Mother   . Depression Mother   . Heart failure Father   . Obesity Brother   . Heart disease Brother   . Stroke Brother   . Depression Brother   . Breast cancer Cousin   . Breast cancer Cousin   . Breast cancer Cousin     Social History:  reports that she quit smoking about 55  years ago. She has a 10.00 pack-year smoking history. She has never used smokeless tobacco. She reports current alcohol use of about 7.0 standard drinks of alcohol per week. She reports that she does not use drugs. She previously smoked 1/2 pack/day x 10 years. She stopped smoking on 10/11/1978. She drinks 6 glasses of wine/week. She has 3 children (2 sons and 1 daughter). She is a retired Clinical cytogeneticist for [employer]. She likes to do crossword puzzles. Her daughter's name is [name]. Her husband is in an assisted living facility [facility]) secondary to dementia. She lives in [place]. The patient is alone today.  Allergies:  Allergies  Allergen Reactions  . Sulfamethoxazole Other (See Comments)    Anxiety and imsomnia  . Other Itching    Vit C-vit E-copper-zinc-lutein - itching and fatigue (per [facility])  . Statins Other (See Comments)    Muscle pain   . Trazodone Other (See Comments)    Pt states she felt "unsteady"   . Ciprofloxacin Anxiety  . Sulfa Antibiotics Anxiety    Current Medications: Current Outpatient Medications  Medication Sig Dispense Refill  . amLODipine (NORVASC) 10 MG tablet TAKE 1 TABLET BY MOUTH EVERY DAY    . buPROPion (WELLBUTRIN XL) 300 MG 24 hr tablet Take by mouth daily.    . busPIRone (BUSPAR) 10 MG tablet Take 10 mg by mouth 3 (three) times daily.    . calcium carbonate 1250 MG capsule Take 1,250 mg by mouth daily.     . Cholecalciferol (VITAMIN D3) 1000 units CAPS Take 2,000 Units by mouth 2 (two) times daily.     Marland Kitchen denosumab (PROLIA) 60 MG/ML SOLN injection Inject 60 mg into the skin every 6 (six) months. Administer in upper arm, thigh, or abdomen    . exemestane (AROMASIN) 25 MG tablet TAKE 1 TABLET (25 MG TOTAL) BY MOUTH DAILY AFTER BREAKFAST. 90 tablet 3  . lisinopril (ZESTRIL) 40 MG tablet Take 40 mg by mouth daily.    Marland Kitchen loperamide (IMODIUM) 2 MG capsule Take 2 mg by mouth as needed for diarrhea or loose stools.     . Magnesium 250 MG TABS Take 500 mg  by mouth daily.     Marland Kitchen omeprazole (PRILOSEC) 40 MG capsule Take 40 mg by mouth daily.     . rosuvastatin (CRESTOR) 5 MG tablet Take 5 mg by mouth daily.    . sertraline (ZOLOFT) 100 MG tablet Take 1.5 tablets (150 mg total) by mouth daily. (Patient taking differently: Take 100 mg by mouth daily.) 45 tablet 1  . Turmeric Curcumin 500 MG CAPS Take 1,500 mg by mouth daily.     . vitamin B-12 (CYANOCOBALAMIN) 500 MCG tablet Take 1,000 mcg by mouth every other day.     Marland Kitchen acetaminophen (TYLENOL) 500 MG tablet Take 650 mg by mouth every 6 (six) hours as needed.  (Patient not taking: Reported on 05/04/2020)     No current facility-administered medications for this visit.    Review of Systems  Constitutional: Negative for chills, diaphoresis, fever, malaise/fatigue and weight  loss (up 17 pounds).       Feels well.  HENT: Negative.  Negative for congestion, ear pain, hearing loss, nosebleeds, sinus pain and sore throat.   Eyes: Negative.  Negative for blurred vision, double vision and photophobia.  Respiratory: Negative.  Negative for cough, hemoptysis, sputum production and shortness of breath.   Cardiovascular: Negative.  Negative for chest pain, palpitations, orthopnea, leg swelling and PND.  Gastrointestinal: Negative for abdominal pain, blood in stool, constipation, diarrhea, melena, nausea and vomiting.       Normal bowels with mild leakage.  Genitourinary: Negative.  Negative for dysuria, frequency, hematuria and urgency.  Musculoskeletal: Positive for joint pain (left knee bone-on-bone). Negative for back pain, falls, myalgias and neck pain.  Skin: Negative for itching and rash.       Hair loss.  Neurological: Negative for dizziness, tremors, sensory change, speech change, focal weakness, weakness and headaches.       Poor balance.  Endo/Heme/Allergies: Negative.  Does not bruise/bleed easily.  Psychiatric/Behavioral: Negative.  Negative for depression and memory loss. The patient is not  nervous/anxious and does not have insomnia.   All other systems reviewed and are negative.  Performance status (ECOG): 1  Vitals Blood pressure (!) 149/76, pulse 94, temperature 98.1 F (36.7 C), temperature source Tympanic, resp. rate 18, weight 212 lb 8.4 oz (96.4 kg), SpO2 98 %.  Physical Exam Vitals and nursing note reviewed.  Constitutional:      General: She is not in acute distress.    Appearance: She is well-developed. She is not diaphoretic.  HENT:     Head: Normocephalic and atraumatic.     Comments: Short dark hair. Eyes:     General: No scleral icterus.    Conjunctiva/sclera: Conjunctivae normal.     Pupils: Pupils are equal, round, and reactive to light.     Comments: Blue eyes.  Neck:     Vascular: No JVD.  Cardiovascular:     Rate and Rhythm: Normal rate and regular rhythm.     Heart sounds: Normal heart sounds. No murmur heard.   Pulmonary:     Effort: Pulmonary effort is normal. No respiratory distress.     Breath sounds: Normal breath sounds. No wheezing or rales.  Chest:     Chest wall: Edema (right breast; inferior ) present.  Breasts: Breasts are symmetrical.     Right: No inverted nipple, mass, nipple discharge, skin change, tenderness or supraclavicular adenopathy.     Left: No inverted nipple, mass, nipple discharge, skin change, tenderness or supraclavicular adenopathy.    Abdominal:     General: Bowel sounds are normal. There is no distension.     Palpations: Abdomen is soft. There is no mass.     Tenderness: There is no abdominal tenderness. There is no guarding or rebound.  Musculoskeletal:        General: Normal range of motion.     Cervical back: Normal range of motion and neck supple.  Lymphadenopathy:     Head:     Right side of head: No preauricular, posterior auricular or occipital adenopathy.     Left side of head: No preauricular, posterior auricular or occipital adenopathy.     Cervical: No cervical adenopathy.     Upper Body:      Right upper body: No supraclavicular adenopathy.     Left upper body: No supraclavicular adenopathy.     Lower Body: No right inguinal adenopathy. No left inguinal adenopathy.  Skin:  General: Skin is warm and dry.     Coloration: Skin is not pale.     Findings: No erythema.  Neurological:     Mental Status: She is alert and oriented to person, place, and time.  Psychiatric:        Behavior: Behavior normal.        Thought Content: Thought content normal.        Judgment: Judgment normal.    Appointment on 05/04/2020  Component Date Value Ref Range Status  . WBC 05/04/2020 6.5  4.0 - 10.5 K/uL Final  . RBC 05/04/2020 4.15  3.87 - 5.11 MIL/uL Final  . Hemoglobin 05/04/2020 13.0  12.0 - 15.0 g/dL Final  . HCT 05/04/2020 38.9  36.0 - 46.0 % Final  . MCV 05/04/2020 93.7  80.0 - 100.0 fL Final  . MCH 05/04/2020 31.3  26.0 - 34.0 pg Final  . MCHC 05/04/2020 33.4  30.0 - 36.0 g/dL Final  . RDW 05/04/2020 13.4  11.5 - 15.5 % Final  . Platelets 05/04/2020 255  150 - 400 K/uL Final  . nRBC 05/04/2020 0.0  0.0 - 0.2 % Final  . Neutrophils Relative % 05/04/2020 58  % Final  . Neutro Abs 05/04/2020 3.8  1.7 - 7.7 K/uL Final  . Lymphocytes Relative 05/04/2020 29  % Final  . Lymphs Abs 05/04/2020 1.9  0.7 - 4.0 K/uL Final  . Monocytes Relative 05/04/2020 9  % Final  . Monocytes Absolute 05/04/2020 0.6  0.1 - 1.0 K/uL Final  . Eosinophils Relative 05/04/2020 2  % Final  . Eosinophils Absolute 05/04/2020 0.2  0.0 - 0.5 K/uL Final  . Basophils Relative 05/04/2020 1  % Final  . Basophils Absolute 05/04/2020 0.1  0.0 - 0.1 K/uL Final  . Immature Granulocytes 05/04/2020 1  % Final  . Abs Immature Granulocytes 05/04/2020 0.06  0.00 - 0.07 K/uL Final   Performed at [facility], [address] [place], [place] [address]  . Sodium 05/04/2020 140  135 - 145 mmol/L Final  . Potassium 05/04/2020 3.8  3.5 - 5.1 mmol/L Final  . Chloride 05/04/2020 101  98 - 111 mmol/L Final  . CO2  05/04/2020 25  22 - 32 mmol/L Final  . Glucose, Bld 05/04/2020 99  70 - 99 mg/dL Final   Glucose reference range applies only to samples taken after fasting for at least 8 hours.  . BUN 05/04/2020 21  8 - 23 mg/dL Final  . Creatinine, Ser 05/04/2020 1.27* 0.44 - 1.00 mg/dL Final  . Calcium 05/04/2020 9.9  8.9 - 10.3 mg/dL Final  . Total Protein 05/04/2020 7.5  6.5 - 8.1 g/dL Final  . Albumin 05/04/2020 4.9  3.5 - 5.0 g/dL Final  . AST 05/04/2020 22  15 - 41 U/L Final  . ALT 05/04/2020 18  0 - 44 U/L Final  . Alkaline Phosphatase 05/04/2020 43  38 - 126 U/L Final  . Total Bilirubin 05/04/2020 0.5  0.3 - 1.2 mg/dL Final  . GFR, Estimated 05/04/2020 42* >60 mL/min Final   Comment: (NOTE) Calculated using the CKD-EPI Creatinine Equation (2021)   . Anion gap 05/04/2020 14  5 - 15 Final   Performed at [facility], [address] [place],  [address]    Assessment:  Allison Hall is a 80 y.o. female withmulti-focalright breast cancers/p partial mastectomy with sentinel lymph node biopsy on 09/29/2016. Pathology revealed a 1.5 cm grade I invasive carcinoma, tubulo-lobular variant and  a 1.6 cm grade I invasive carcinoma, tubulo-lobular variant. Three sentinel lymph nodes were negative. Tumor was ER + (>90%), PR + (>90%) and Her2/neu 2+ (negative by FISH). Pathologic stage was T1c(m) N0(sn). CA27.29was 13.4 on 09/06/2016.  MammaPrintrevealed a 29% risk of recurrence (high). She was seen for second opinion at [facility] on 10/25/2016. Chemotherapy was not recommended.  Bilateral screening mammogramon 07/24/2016 revealed a possible area of distortion in the right breast. Diagnostic mammogram on 08/10/2016 revealed 2 suspicious masses in the upper-outer quadrant of the right breast in a broad area of confluent distortion in the upper-outer quadrant of the right breast. There are at least 3 areas of confluent distortion spanning approximately 6.5 cm of tissue.  Ultrasound on 07/31/2016 revealed an irregular hypoechoic 1.2 x 1.1 x 1.2 cm at the 9:30 position 7 cm from the nipple. In addition, there was an irregular 1.9 x 0.8 x 1.2 cm mass at the 10 o'clock position 5 cm from the nipple. There were multiple small normal-appearing lymph nodes.   Breast MRIon 09/16/2016 revealed an irregular 1.6 x 1.2 x 1.6 cm enhancing mass adjacent to the clip artifact within the upper, outer right breast, middle depth, corresponding to the mass seen at 10 o'clock. There was an additional irregular enhancing mass adjacent to the more posterior clip artifact measuring 2.6 x 1.4 x 1.6 cm, corresponding to the mass seen at 9:30 position. Together, these 2 masses span 6.3 cm of breast tissue from anterior to posterior. There was no definite additional suspicious area of enhancement within the right breast to correspond to the third area of distortion identified on diagnostic mammogram dated 08/10/2016. Left breast revealed no suspicious mass or enhancement.  Bilateral diagnostic mammogram on 09/03/2019 revealed no evidence of malignancy.   She received radiationfrom 11/15/2016 - 01/08/2017. She was Femara(01/17/2017 - 05/16/2017) secondary to arthralgias. She discontinued Arimidexafter 19 days (last 06/14/2017) secondary to fatigue and polyarthralgias. She beganAromasinon 06/27/2017.  CA27.29 has been followed: 13.4 on 09/06/2016, 13.0 on 05/16/2017, 17.1 on 08/29/2017, 12.1 on 06/04/2018, 13.8 on 10/01/2018, and 15.6 on 02/04/2019.  Bone density studyon 09/30/2015 revealed osteopeniawith a T-score of -2.1 in the right femoral neck. Bone density studyrevealed osteoporosis on 10/02/2017 with a T-score of -3.1. She began Proliaon 02/14/2017 (last 10/09/2019).  She was diagnosed with iron deficiency anemiaand B12 deficiencyon 08/29/2017. TSH and folate were normal. She is on oral B12 500 mcg a day. Intrinsic factor and anti-parietal cell antibodies were  normal on 01/02/2018. B12 was 888 on 01/29/2018 and 544 on 02/04/2019.  She received Venoferweekly x 3 (10/31/2017 - 11/13/2017) and x 4 (06/05/2018 - 06/26/2018).  Ferritinhas been followed: 10 on 08/29/2017, 174 on 12/12/2017, 74 on 01/02/2018, 64 on 01/29/2018, 3 on 06/04/2018, 205 on 10/01/2018, and 162 on 02/04/2019.  EGDand colonoscopyon 12/03/2017. Polyps were noted. Pathology was negative for dysplasia and malignancy.   She tested positive for COVID-19 on 02/14/2020.  Symptomatically, she is doing well.  She denies any breast concerns.  She has some hair thinning.  Exam reveals no evidence of recurrent disease.  Plan: 1.   Labs today: CBC with diff, CMP, CA27.29, B12, ferritin. 2.   MultifocalRIGHTbreast cancer             Clinically, she is doing well.             Exam reveals no evidence of recurrent disease.  Bilateral mammogram on 09/03/2019 revealed no evidence of malignancy.  Continue Aromasin. 3.   Iron deficiency anemia, resolved             Hematocrit 38.9 Hemoglobin 13.0.  MCV 93.7.             Ferritin 89.             Patient off oral iron.             Continue to monitor 4.   Osteoporosis Patient on calcium 1200 mg and vitamin D 800 IU daily. Patient receives Prolia every 6 months (last 10/09/2019). Patient denies any dental concerns. Prolia today. 5.   B12 deficiency B12 is 525 today. She is on B12 500 mcg/day. B12 goal is >= 400. Folate was 12.7 on 10/09/2019  Check annually.  6.   Hair loss  Incidence of Aromasin associated with hair thinning 15%.  Discussconsideration of tamoxifen.  Information provided.  Consult dermatology. 7.   Dermatology consult (Dr Phillip Heal). 8.   Mammogram on 09/02/2020. 9.   Prolia today. 10.   RTC in 6 months for MD assess, labs (CBC with diff, CMP, CA27.29), and Prolia.  I discussed the assessment and treatment plan with the patient.  The patient was provided an opportunity to ask questions and all were  answered.  The patient agreed with the plan and demonstrated an understanding of the instructions.  The patient was advised to call back if the symptoms worsen or if the condition fails to improve as anticipated.   Lequita Asal, MD, PhD    05/04/2020, 10:53 AM

## 2020-05-04 ENCOUNTER — Inpatient Hospital Stay: Payer: Medicare HMO

## 2020-05-04 ENCOUNTER — Inpatient Hospital Stay: Payer: Medicare HMO | Attending: Hematology and Oncology

## 2020-05-04 ENCOUNTER — Inpatient Hospital Stay: Payer: Medicare HMO | Admitting: Hematology and Oncology

## 2020-05-04 ENCOUNTER — Other Ambulatory Visit: Payer: Self-pay

## 2020-05-04 ENCOUNTER — Encounter: Payer: Self-pay | Admitting: Hematology and Oncology

## 2020-05-04 VITALS — BP 149/76 | HR 94 | Temp 98.1°F | Resp 18 | Wt 212.5 lb

## 2020-05-04 DIAGNOSIS — Z170 Estrogen receptor positive status [ER+]: Secondary | ICD-10-CM | POA: Diagnosis not present

## 2020-05-04 DIAGNOSIS — C50411 Malignant neoplasm of upper-outer quadrant of right female breast: Secondary | ICD-10-CM

## 2020-05-04 DIAGNOSIS — D509 Iron deficiency anemia, unspecified: Secondary | ICD-10-CM | POA: Diagnosis not present

## 2020-05-04 DIAGNOSIS — M810 Age-related osteoporosis without current pathological fracture: Secondary | ICD-10-CM | POA: Insufficient documentation

## 2020-05-04 DIAGNOSIS — E538 Deficiency of other specified B group vitamins: Secondary | ICD-10-CM

## 2020-05-04 DIAGNOSIS — L659 Nonscarring hair loss, unspecified: Secondary | ICD-10-CM | POA: Insufficient documentation

## 2020-05-04 LAB — CBC WITH DIFFERENTIAL/PLATELET
Abs Immature Granulocytes: 0.06 10*3/uL (ref 0.00–0.07)
Basophils Absolute: 0.1 10*3/uL (ref 0.0–0.1)
Basophils Relative: 1 %
Eosinophils Absolute: 0.2 10*3/uL (ref 0.0–0.5)
Eosinophils Relative: 2 %
HCT: 38.9 % (ref 36.0–46.0)
Hemoglobin: 13 g/dL (ref 12.0–15.0)
Immature Granulocytes: 1 %
Lymphocytes Relative: 29 %
Lymphs Abs: 1.9 10*3/uL (ref 0.7–4.0)
MCH: 31.3 pg (ref 26.0–34.0)
MCHC: 33.4 g/dL (ref 30.0–36.0)
MCV: 93.7 fL (ref 80.0–100.0)
Monocytes Absolute: 0.6 10*3/uL (ref 0.1–1.0)
Monocytes Relative: 9 %
Neutro Abs: 3.8 10*3/uL (ref 1.7–7.7)
Neutrophils Relative %: 58 %
Platelets: 255 10*3/uL (ref 150–400)
RBC: 4.15 MIL/uL (ref 3.87–5.11)
RDW: 13.4 % (ref 11.5–15.5)
WBC: 6.5 10*3/uL (ref 4.0–10.5)
nRBC: 0 % (ref 0.0–0.2)

## 2020-05-04 LAB — COMPREHENSIVE METABOLIC PANEL
ALT: 18 U/L (ref 0–44)
AST: 22 U/L (ref 15–41)
Albumin: 4.9 g/dL (ref 3.5–5.0)
Alkaline Phosphatase: 43 U/L (ref 38–126)
Anion gap: 14 (ref 5–15)
BUN: 21 mg/dL (ref 8–23)
CO2: 25 mmol/L (ref 22–32)
Calcium: 9.9 mg/dL (ref 8.9–10.3)
Chloride: 101 mmol/L (ref 98–111)
Creatinine, Ser: 1.27 mg/dL — ABNORMAL HIGH (ref 0.44–1.00)
GFR, Estimated: 42 mL/min — ABNORMAL LOW (ref >60)
Glucose, Bld: 99 mg/dL (ref 70–99)
Potassium: 3.8 mmol/L (ref 3.5–5.1)
Sodium: 140 mmol/L (ref 135–145)
Total Bilirubin: 0.5 mg/dL (ref 0.3–1.2)
Total Protein: 7.5 g/dL (ref 6.5–8.1)

## 2020-05-04 LAB — FERRITIN: Ferritin: 89 ng/mL (ref 11–307)

## 2020-05-04 LAB — VITAMIN B12: Vitamin B-12: 525 pg/mL (ref 180–914)

## 2020-05-04 MED ORDER — DENOSUMAB 60 MG/ML ~~LOC~~ SOSY
60.0000 mg | PREFILLED_SYRINGE | Freq: Once | SUBCUTANEOUS | Status: AC
  Administered 2020-05-04: 60 mg via SUBCUTANEOUS

## 2020-05-04 NOTE — Patient Instructions (Addendum)
Patient to call after after dermatology appointment.   Tamoxifen oral tablet What is this medicine? TAMOXIFEN (ta MOX i fen) blocks the effects of estrogen. It is commonly used to treat breast cancer. It is also used to decrease the chance of breast cancer coming back in women who have received treatment for the disease. It may also help prevent breast cancer in women who have a high risk of developing breast cancer. This medicine may be used for other purposes; ask your health care provider or pharmacist if you have questions. COMMON BRAND NAME(S): Nolvadex What should I tell my health care provider before I take this medicine? They need to know if you have any of these conditions:  blood clots  blood disease  cataracts or impaired eyesight  endometriosis  high calcium levels  high cholesterol  irregular menstrual cycles  liver disease  stroke  uterine fibroids  an unusual reaction to tamoxifen, other medicines, foods, dyes, or preservatives  pregnant or trying to get pregnant  breast-feeding How should I use this medicine? Take this medicine by mouth with a glass of water. Follow the directions on the prescription label. You can take it with or without food. Take your medicine at regular intervals. Do not take your medicine more often than directed. Do not stop taking except on your doctor's advice. A special MedGuide will be given to you by the pharmacist with each prescription and refill. Be sure to read this information carefully each time. Talk to your pediatrician regarding the use of this medicine in children. While this drug may be prescribed for selected conditions, precautions do apply. Overdosage: If you think you have taken too much of this medicine contact a poison control center or emergency room at once. NOTE: This medicine is only for you. Do not share this medicine with others. What if I miss a dose? If you miss a dose, take it as soon as you can. If it  is almost time for your next dose, take only that dose. Do not take double or extra doses. What may interact with this medicine? Do not take this medicine with any of the following medications:  cisapride  dronedarone  pimozide  thioridazine This medicine may also interact with the following medications:  anastrozole  certain medicines for seizures like carbamazepine, phenobarbital, phenytoin  letrozole  other medicines that prolong the QT interval (abnormal heart rhythm)  paroxetine  rifampin  warfarin This list may not describe all possible interactions. Give your health care provider a list of all the medicines, herbs, non-prescription drugs, or dietary supplements you use. Also tell them if you smoke, drink alcohol, or use illegal drugs. Some items may interact with your medicine. What should I watch for while using this medicine? Visit your doctor or health care professional for regular checks on your progress. You will need regular pelvic exams, breast exams, and mammograms. If you are taking this medicine to reduce your risk of getting breast cancer, you should know that this medicine does not prevent all types of breast cancer. If breast cancer or other problems occur, there is no guarantee that it will be found at an early stage. Do not become pregnant while taking this medicine or for 2 months after stopping it. Women should inform their doctor if they wish to become pregnant or think they might be pregnant. There is a potential for serious side effects to an unborn child. Talk to your health care professional or pharmacist for more information. Do not  breast-feed an infant while taking this medicine or for 3 months after stopping it. This medicine may interfere with the ability to have a child. Talk with your doctor or health care professional if you are concerned about your fertility. What side effects may I notice from receiving this medicine? Side effects that you should  report to your doctor or health care professional as soon as possible:  allergic reactions like skin rash, itching or hives, swelling of the face, lips, or tongue  changes in vision  changes in your menstrual cycle  difficulty walking or talking  new breast lumps  numbness  pelvic pain or pressure  redness, blistering, peeling or loosening of the skin, including inside the mouth  signs and symptoms of a dangerous change in heartbeat or heart rhythm like chest pain, dizziness, fast or irregular heartbeat, palpitations, feeling faint or lightheaded, falls, breathing problems  sudden chest pain  swelling, pain or tenderness in your calf or leg  unusual bruising or bleeding  vaginal discharge that is bloody, brown, or rust  weakness  yellowing of the whites of the eyes or skin Side effects that usually do not require medical attention (report to your doctor or health care professional if they continue or are bothersome):  fatigue  hair loss, although uncommon and is usually mild  headache  hot flashes  impotence (in men)  nausea, vomiting (mild)  vaginal discharge (white or clear) This list may not describe all possible side effects. Call your doctor for medical advice about side effects. You may report side effects to FDA at 1-800-FDA-1088. Where should I keep my medicine? Keep out of the reach of children. Store at room temperature between 20 and 25 degrees C (68 and 77 degrees F). Protect from light. Keep container tightly closed. Throw away any unused medicine after the expiration date. NOTE: This sheet is a summary. It may not cover all possible information. If you have questions about this medicine, talk to your doctor, pharmacist, or health care provider.  2021 Elsevier/Gold Standard (2019-02-12 17:03:50)

## 2020-05-05 LAB — CA 27.29 (SERIAL MONITOR): CA 27.29: 13.4 U/mL (ref 0.0–38.6)

## 2020-06-14 ENCOUNTER — Ambulatory Visit
Admission: EM | Admit: 2020-06-14 | Discharge: 2020-06-14 | Disposition: A | Discharge status: Home / Self Care | Payer: Medicare HMO | Attending: Family Medicine | Admitting: Family Medicine

## 2020-06-14 ENCOUNTER — Ambulatory Visit (INDEPENDENT_AMBULATORY_CARE_PROVIDER_SITE_OTHER): Payer: Medicare HMO

## 2020-06-14 ENCOUNTER — Encounter: Payer: Self-pay | Admitting: Emergency Medicine

## 2020-06-14 ENCOUNTER — Other Ambulatory Visit: Payer: Self-pay

## 2020-06-14 DIAGNOSIS — M47897 Other spondylosis, lumbosacral region: Secondary | ICD-10-CM | POA: Diagnosis not present

## 2020-06-14 NOTE — ED Triage Notes (Signed)
Patient c/o tailbone pain that started 3 weeks ago. Denies falls.

## 2020-09-07 ENCOUNTER — Ambulatory Visit
Admission: RE | Admit: 2020-09-07 | Discharge: 2020-09-07 | Disposition: A | Discharge status: Home / Self Care | Payer: Medicare HMO | Source: Ambulatory Visit | Attending: Hematology and Oncology | Admitting: Hematology and Oncology

## 2020-09-07 ENCOUNTER — Other Ambulatory Visit: Payer: Self-pay

## 2020-09-07 DIAGNOSIS — Z1231 Encounter for screening mammogram for malignant neoplasm of breast: Secondary | ICD-10-CM | POA: Diagnosis not present

## 2020-09-07 DIAGNOSIS — Z170 Estrogen receptor positive status [ER+]: Secondary | ICD-10-CM | POA: Diagnosis not present

## 2020-09-07 DIAGNOSIS — C50411 Malignant neoplasm of upper-outer quadrant of right female breast: Secondary | ICD-10-CM | POA: Diagnosis not present

## 2020-09-16 ENCOUNTER — Ambulatory Visit
Admission: EM | Admit: 2020-09-16 | Discharge: 2020-09-16 | Disposition: A | Discharge status: Home / Self Care | Payer: Medicare HMO | Attending: Family Medicine | Admitting: Family Medicine

## 2020-09-16 ENCOUNTER — Ambulatory Visit (INDEPENDENT_AMBULATORY_CARE_PROVIDER_SITE_OTHER): Payer: Medicare HMO

## 2020-09-16 ENCOUNTER — Other Ambulatory Visit: Payer: Self-pay

## 2020-09-16 DIAGNOSIS — M5441 Lumbago with sciatica, right side: Secondary | ICD-10-CM

## 2020-09-16 DIAGNOSIS — M545 Low back pain, unspecified: Secondary | ICD-10-CM

## 2020-09-16 DIAGNOSIS — M25551 Pain in right hip: Secondary | ICD-10-CM | POA: Diagnosis not present

## 2020-09-16 MED ORDER — PREDNISONE 10 MG PO TABS: ORAL_TABLET | ORAL | 0 refills | Status: DC

## 2020-09-16 NOTE — ED Triage Notes (Signed)
Patient states that she has been having right hip pain since Monday after going to the grocery store and taking out the trash. States that pain radiates down her leg.   Patient states that she has been having fatigue and is concerned her sodium is low.

## 2020-09-16 NOTE — Discharge Instructions (Addendum)
Rest, ice and heat.  Medication as prescribed.  Follow up with orthopedics.  Take care  Dr. Lacinda Axon

## 2020-09-16 NOTE — ED Provider Notes (Signed)
[facility]    CSN: [id number] Arrival date & time: 09/16/20  1059      History   Chief Complaint Chief Complaint  Patient presents with   Hip Pain    right   Fatigue    HPI  80 year old female presents the above complaints.  Patient reports that she has been having pain on the lateral aspect of the right hip since Monday.  She states that it occurred after she lifted a box and took the trash out.  Patient states that the pain radiates down her right leg.  She has known osteoarthritis.  Patient believes that she is experiencing low back pain which is radiating to her hip and down her leg.  Pain 3/10 in severity.  No relieving factors.  Worse with activity.  No saddle anesthesia or incontinence.  Additionally, patient reports ongoing fatigue.  She states that she is not sleeping well at night.  She is concerned that her sodium is low.  She would like to discuss this today.  Past Medical History:  Diagnosis Date   Anemia    in past   Anxiety    Breast cancer ([place]) 08/24/2016   right breast UOQ   Cancer ([place])    skin ca   Colon polyp    COVID-19 02/14/2020   received treatment   Depression    GERD (gastroesophageal reflux disease)    Hypercholesteremia    Hypertension    Osteoarthritis    Personal history of radiation therapy 2018   RIGHT BREAST CA UOQ   Scoliosis of lumbar spine    Thyroid nodule 04/17/2016    Patient Active Problem List   Diagnosis Date Noted   Hair thinning 05/04/2020   Chronic pain of left knee 04/04/2018   Chronic pain syndrome 04/04/2018   Iron deficiency anemia 10/31/2017   B12 deficiency 10/31/2017   Arthralgia 06/03/2017   Osteoporosis 10/04/2016   Breast cancer of upper-outer quadrant of right female breast ([place]) 08/24/2016   Closed fracture of distal end of ulna 06/01/2015   Triggering of digit 05/27/2014   Hamstring muscle strain 05/26/2014   Arthritis of knee, degenerative 03/26/2014   Long term current use of opiate  analgesic 04/21/2013   Arthritis, degenerative 04/21/2013   Anxiety 10/05/2011   Colon polyp 10/05/2011   Esophageal foreign body 10/05/2011   Acid reflux 10/05/2011   BP (high blood pressure) 10/05/2011   Hypercholesterolemia 10/05/2011    Past Surgical History:  Procedure Laterality Date   ABDOMINAL HYSTERECTOMY     APPENDECTOMY     BREAST BIOPSY Right 08/24/2016   9:30 - invasive mammary carcinoma with lobular features   BREAST BIOPSY Right 08/24/2016   10:00 - invasive mammary carcinoma with lobular features   BREAST LUMPECTOMY Right 10/09/2016   INVASIVE CARCINOMA, TUBULO-LOBULAR VARIANT, CLEAR MARGINS, NEGATIVE LN'S   CATARACT EXTRACTION W/PHACO Left 04/06/2020   Procedure: CATARACT EXTRACTION PHACO AND INTRAOCULAR LENS PLACEMENT ([place]) LEFT;  Surgeon: Birder Robson, MD;  Location: [place];  Service: Ophthalmology;  Laterality: Left;  5.49 0:33.9   CATARACT EXTRACTION W/PHACO Right 04/20/2020   Procedure: CATARACT EXTRACTION PHACO AND INTRAOCULAR LENS PLACEMENT ([place]) RIGHT;  Surgeon: Birder Robson, MD;  Location: [place];  Service: Ophthalmology;  Laterality: Right;  7.26 0:47.2   COLONOSCOPY W/ POLYPECTOMY  2003, 2008, 2014   COLONOSCOPY WITH PROPOFOL N/A 12/03/2017   Procedure: COLONOSCOPY WITH PROPOFOL;  Surgeon: Manya Silvas, MD;  Location[facility];  Service: Endoscopy;  Laterality: N/A;  ESOPHAGOGASTRODUODENOSCOPY     foreign body   ESOPHAGOGASTRODUODENOSCOPY (EGD) WITH PROPOFOL N/A 12/03/2017   Procedure: ESOPHAGOGASTRODUODENOSCOPY (EGD) WITH PROPOFOL;  Surgeon: Manya Silvas, MD;  Location: [facility] ENDOSCOPY;  Service: Endoscopy;  Laterality: N/A;   JOINT REPLACEMENT Right 2008   TKR   NOSE SURGERY     PARTIAL MASTECTOMY WITH NEEDLE LOCALIZATION Right 09/29/2016   Procedure: PARTIAL MASTECTOMY WITH NEEDLE LOCALIZATION;  Surgeon: [name], MD;  Location: [facility];  Service: General;  Laterality: Right;   REPLACEMENT  TOTAL KNEE Right 2008   SENTINEL NODE BIOPSY Right 09/29/2016   Procedure: SENTINEL NODE BIOPSY;  Surgeon: [name], MD;  Location: [facility];  Service: General;  Laterality: Right;    OB History   No obstetric history on file.      Home Medications    Prior to Admission medications   Medication Sig Start Date End Date Taking? Authorizing Provider  acetaminophen (TYLENOL) 500 MG tablet Take 650 mg by mouth every 6 (six) hours as needed.   Yes [provider]  amLODipine (NORVASC) 10 MG tablet TAKE 1 TABLET BY MOUTH EVERY DAY 09/10/18  Yes [provider]  buPROPion (WELLBUTRIN XL) 300 MG 24 hr tablet Take by mouth daily. 12/25/17  Yes [provider]  busPIRone (BUSPAR) 10 MG tablet Take 10 mg by mouth 3 (three) times daily. 04/17/16  Yes [provider]  calcium carbonate 1250 MG capsule Take 1,250 mg by mouth daily.    Yes [provider]  Cholecalciferol (VITAMIN D3) 1000 units CAPS Take 2,000 Units by mouth 2 (two) times daily.    Yes [provider]  denosumab (PROLIA) 60 MG/ML SOLN injection Inject 60 mg into the skin every 6 (six) months. Administer in upper arm, thigh, or abdomen   Yes [provider]  exemestane (AROMASIN) 25 MG tablet TAKE 1 TABLET (25 MG TOTAL) BY MOUTH DAILY AFTER BREAKFAST. 10/26/19  Yes Corcoran, Melissa C, MD  lisinopril (ZESTRIL) 40 MG tablet Take 40 mg by mouth daily. 11/24/18  Yes [provider]  loperamide (IMODIUM) 2 MG capsule Take 2 mg by mouth as needed for diarrhea or loose stools.    Yes [provider]  Magnesium 250 MG TABS Take 500 mg by mouth daily.    Yes [provider]  omeprazole (PRILOSEC) 40 MG capsule Take 40 mg by mouth daily.  10/20/14 09/16/20 Yes [provider]  predniSONE (DELTASONE) 10 MG tablet 50 mg daily x 2 days, then 40 mg daily x 2 days, then 30 mg daily x 2 days, then 20 mg daily x 2 days, then 10 mg daily x 2 days. 09/16/20   Yes Severus Brodzinski G, DO  rosuvastatin (CRESTOR) 5 MG tablet Take 5 mg by mouth daily. 04/17/16 09/16/20 Yes [provider]  Turmeric Curcumin 500 MG CAPS Take 1,500 mg by mouth daily.    Yes [provider]  vitamin B-12 (CYANOCOBALAMIN) 500 MCG tablet Take 1,000 mcg by mouth every other day.    Yes [provider]  sertraline (ZOLOFT) 100 MG tablet Take 1.5 tablets (150 mg total) by mouth daily. Patient taking differently: Take 100 mg by mouth daily. 05/19/15 02/08/20  Elvin So, MD    Family History Family History  Problem Relation Age of Onset   Hypertension Mother    Depression Mother    Heart failure Father    Obesity Brother    Heart disease Brother    Stroke Brother  Depression Brother    Breast cancer Cousin    Breast cancer Cousin    Breast cancer Cousin     Social History Social History   Tobacco Use   Smoking status: Former    Packs/day: 1.00    Years: 10.00    Pack years: 10.00    Types: Cigarettes    Quit date: 05/18/1965    Years since quitting: 55.3   Smokeless tobacco: Never  Vaping Use   Vaping Use: Never used  Substance Use Topics   Alcohol use: Yes    Alcohol/week: 7.0 standard drinks    Types: 7 Glasses of wine per week   Drug use: No     Allergies   Sulfamethoxazole, Other, Statins, Trazodone, Ciprofloxacin, and Sulfa antibiotics   Review of Systems Review of Systems Per HPI  Physical Exam Triage Vital Signs ED Triage Vitals  Enc Vitals Group     BP 09/16/20 1132 (!) 143/92     Pulse Rate 09/16/20 1132 89     Resp 09/16/20 1132 15     Temp 09/16/20 1132 98.3 F (36.8 C)     Temp Source 09/16/20 1132 Oral     SpO2 09/16/20 1132 100 %     Weight 09/16/20 1133 200 lb (90.7 kg)     Height 09/16/20 1133 5\' 4"  (1.626 m)     Head Circumference --      Peak Flow --      Pain Score 09/16/20 1133 3     Pain Loc --      Pain Edu? --      Excl. in [place]? --    Updated Vital Signs BP (!) 143/92 (BP Location:  Right Arm)   Pulse 89   Temp 98.3 F (36.8 C) (Oral)   Resp 15   Ht 5\' 4"  (1.626 m)   Wt 90.7 kg   SpO2 100%   BMI 34.33 kg/m   Visual Acuity Right Eye Distance:   Left Eye Distance:   Bilateral Distance:    Right Eye Near:   Left Eye Near:    Bilateral Near:     Physical Exam Vitals and nursing note reviewed.  Constitutional:      General: She is not in acute distress.    Appearance: Normal appearance. She is not ill-appearing.  HENT:     Head: Normocephalic and atraumatic.  Cardiovascular:     Rate and Rhythm: Normal rate and regular rhythm.  Pulmonary:     Effort: Pulmonary effort is normal.     Breath sounds: Normal breath sounds. No wheezing or rales.  Musculoskeletal:     Comments: Mild tenderness over the greater trochanter. No pain with ROM of the hip.   Neurological:     Mental Status: She is alert.  Psychiatric:        Mood and Affect: Mood normal.        Behavior: Behavior normal.     UC Treatments / Results  Labs (all labs ordered are listed, but only abnormal results are displayed) Labs Reviewed - No data to display  EKG   Radiology DG Lumbar Spine Complete  Result Date: 09/16/2020 CLINICAL DATA:  Hip pain EXAM: LUMBAR SPINE - COMPLETE 4+ VIEW COMPARISON:  None. FINDINGS: Mild curvature of the upper lumbar spine is convex towards the right. There is marked multi level disc space narrowing and ventral endplate spurring throughout the lumbar spine. L4-5 and L5-S1 facet arthropathy. No acute fracture. IMPRESSION: 1. Advanced multilevel degenerative  disc disease and lower lumbar spine facet arthropathy. 2. Mild curvature of the upper lumbar spine. Electronically Signed   By: Kerby Moors M.D.   On: 09/16/2020 12:35   DG Hip Unilat W or Wo Pelvis 2-3 Views Right  Result Date: 09/16/2020 CLINICAL DATA:  Hip pain.  Symptoms since Monday. EXAM: DG HIP (WITH OR WITHOUT PELVIS) 2-3V RIGHT COMPARISON:  02/28/2017 FINDINGS: There is mild degenerative change  at the RIGHT hip. No acute fracture or subluxation. Degenerative changes are seen in the LEFT hip in the LOWER lumbar spine. Bowel gas pattern is nonobstructed. IMPRESSION: Degenerative changes. No evidence for acute  abnormality. Electronically Signed   By: Nolon Nations M.D.   On: 09/16/2020 12:34    Procedures Procedures (including critical care time)  Medications Ordered in UC Medications - No data to display  Initial Impression / Assessment and Plan / UC Course  I have reviewed the triage vital signs and the nursing notes.  Pertinent labs & imaging results that were available during my care of the patient were reviewed by me and considered in my medical decision making (see chart for details).    80 year old female presents with low back pain with sciatica.  X-rays of the hip as well as the lumbar spine were obtained and was independently reviewed by me.  Interpretation: Mild osteoarthritis of the right hip.  Extensive degenerative changes/degenerative disc disease as well as facet arthropathy of the lumbar spine.  Patient was placed on prednisone.  Follow-up with orthopedics.  Regarding her fatigue, we discussed proceeding with laboratory studies and patient elected to wait at this time.  Final Clinical Impressions(s) / UC Diagnoses   Final diagnoses:  Acute right-sided low back pain with right-sided sciatica     Discharge Instructions      Rest, ice and heat.  Medication as prescribed.  Follow up with orthopedics.  Take care  Dr. Lacinda Axon    ED Prescriptions     Medication Sig Dispense Auth. Provider   predniSONE (DELTASONE) 10 MG tablet 50 mg daily x 2 days, then 40 mg daily x 2 days, then 30 mg daily x 2 days, then 20 mg daily x 2 days, then 10 mg daily x 2 days. 30 tablet Coral Spikes, DO      PDMP not reviewed this encounter.   Coral Spikes, [place] 09/16/20 1415

## 2020-10-29 ENCOUNTER — Other Ambulatory Visit: Payer: Self-pay

## 2020-10-29 DIAGNOSIS — M810 Age-related osteoporosis without current pathological fracture: Secondary | ICD-10-CM

## 2020-10-29 DIAGNOSIS — Z170 Estrogen receptor positive status [ER+]: Secondary | ICD-10-CM

## 2020-10-29 DIAGNOSIS — C50411 Malignant neoplasm of upper-outer quadrant of right female breast: Secondary | ICD-10-CM

## 2020-11-02 ENCOUNTER — Encounter: Payer: Self-pay | Admitting: Internal Medicine

## 2020-11-02 ENCOUNTER — Inpatient Hospital Stay: Payer: Medicare HMO

## 2020-11-02 ENCOUNTER — Other Ambulatory Visit: Payer: Self-pay

## 2020-11-02 ENCOUNTER — Inpatient Hospital Stay: Payer: Medicare HMO | Attending: Internal Medicine

## 2020-11-02 ENCOUNTER — Inpatient Hospital Stay (HOSPITAL_BASED_OUTPATIENT_CLINIC_OR_DEPARTMENT_OTHER): Payer: Medicare HMO | Admitting: Internal Medicine

## 2020-11-02 VITALS — BP 118/73 | HR 85 | Temp 96.9°F | Resp 18 | Wt 210.9 lb

## 2020-11-02 DIAGNOSIS — Z170 Estrogen receptor positive status [ER+]: Secondary | ICD-10-CM | POA: Insufficient documentation

## 2020-11-02 DIAGNOSIS — C50411 Malignant neoplasm of upper-outer quadrant of right female breast: Secondary | ICD-10-CM | POA: Diagnosis present

## 2020-11-02 DIAGNOSIS — Z79811 Long term (current) use of aromatase inhibitors: Secondary | ICD-10-CM | POA: Insufficient documentation

## 2020-11-02 DIAGNOSIS — M810 Age-related osteoporosis without current pathological fracture: Secondary | ICD-10-CM

## 2020-11-02 LAB — CBC WITH DIFFERENTIAL/PLATELET
Abs Immature Granulocytes: 0.04 10*3/uL (ref 0.00–0.07)
Basophils Absolute: 0.1 10*3/uL (ref 0.0–0.1)
Basophils Relative: 1 %
Eosinophils Absolute: 0.1 10*3/uL (ref 0.0–0.5)
Eosinophils Relative: 2 %
HCT: 36.3 % (ref 36.0–46.0)
Hemoglobin: 12.3 g/dL (ref 12.0–15.0)
Immature Granulocytes: 1 %
Lymphocytes Relative: 25 %
Lymphs Abs: 1.3 10*3/uL (ref 0.7–4.0)
MCH: 32 pg (ref 26.0–34.0)
MCHC: 33.9 g/dL (ref 30.0–36.0)
MCV: 94.5 fL (ref 80.0–100.0)
Monocytes Absolute: 0.6 10*3/uL (ref 0.1–1.0)
Monocytes Relative: 11 %
Neutro Abs: 3.2 10*3/uL (ref 1.7–7.7)
Neutrophils Relative %: 60 %
Platelets: 255 10*3/uL (ref 150–400)
RBC: 3.84 MIL/uL — ABNORMAL LOW (ref 3.87–5.11)
RDW: 14 % (ref 11.5–15.5)
WBC: 5.2 10*3/uL (ref 4.0–10.5)
nRBC: 0 % (ref 0.0–0.2)

## 2020-11-02 LAB — COMPREHENSIVE METABOLIC PANEL
ALT: 16 U/L (ref 0–44)
AST: 21 U/L (ref 15–41)
Albumin: 4.6 g/dL (ref 3.5–5.0)
Alkaline Phosphatase: 40 U/L (ref 38–126)
Anion gap: 8 (ref 5–15)
BUN: 18 mg/dL (ref 8–23)
CO2: 27 mmol/L (ref 22–32)
Calcium: 9.5 mg/dL (ref 8.9–10.3)
Chloride: 103 mmol/L (ref 98–111)
Creatinine, Ser: 1.21 mg/dL — ABNORMAL HIGH (ref 0.44–1.00)
GFR, Estimated: 45 mL/min — ABNORMAL LOW (ref >60)
Glucose, Bld: 103 mg/dL — ABNORMAL HIGH (ref 70–99)
Potassium: 4.1 mmol/L (ref 3.5–5.1)
Sodium: 138 mmol/L (ref 135–145)
Total Bilirubin: 0.8 mg/dL (ref 0.3–1.2)
Total Protein: 7.4 g/dL (ref 6.5–8.1)

## 2020-11-02 MED ORDER — DENOSUMAB 60 MG/ML ~~LOC~~ SOSY
60.0000 mg | PREFILLED_SYRINGE | Freq: Once | SUBCUTANEOUS | Status: AC
  Administered 2020-11-02: 60 mg via SUBCUTANEOUS

## 2020-11-02 NOTE — Progress Notes (Signed)
[place] NOTE  Patient Care Team: Wardell Honour, MD as PCP - General (Family Medicine) [name], MD as Referring Physician (Surgery)  CHIEF COMPLAINTS/PURPOSE OF CONSULTATION:  Breast cancer  #  Oncology History Overview Note  Allison Hall, [place] [id number]      Assessment:  Allison Hall is a 80 y.o. female with multi-focal right breast cancer s/p partial mastectomy with sentinel lymph node biopsy on 09/29/2016.  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  Three sentinel lymph nodes were negative.  Tumor was ER + (> 90%), PR + (> 90%) and Her2/neu 2+ (negative by FISH).  Pathologic stage was T1c(m) N0(sn).  CA27.29 was 13.4 on 09/06/2016.   MammaPrint revealed a 29% risk of recurrence (high).  She was seen for second opinion at [facility] on 10/25/2016.  Chemotherapy was not recommended.   Bilateral screening mammogram on 07/24/2016 revealed a possible area of distortion in the right breast. Diagnostic mammogram on 08/10/2016 revealed 2 suspicious masses in the upper-outer quadrant of the right breast in a broad area of confluent distortion in the upper-outer quadrant of the right breast.  There are at least 3 areas of confluent distortion spanning approximately 6.5 cm of tissue.  Ultrasound on 07/31/2016 revealed  an irregular hypoechoic 1.2 x 1.1 x 1.2 cm at the 9:30 position 7 cm from the nipple.  In addition, there was an irregular 1.9 x 0.8 x 1.2 cm mass at the 10 o'clock position 5 cm from the nipple.  There were multiple small normal-appearing lymph nodes.   Breast MRI on 09/16/2016 revealed an irregular 1.6 x 1.2 x 1.6 cm enhancing mass adjacent to the clip artifact within the upper, outer right breast, middle depth, corresponding to the mass seen at 10 o'clock. There was an additional irregular enhancing mass adjacent to the more posterior clip artifact  measuring 2.6 x 1.4 x 1.6 cm, corresponding  to the mass seen at 9:30 position. Together, these 2 masses span 6.3 cm of breast tissue from anterior to posterior. There was no definite additional suspicious area of enhancement within the right breast to correspond to the third area of distortion identified on diagnostic mammogram dated 08/10/2016.  Left breast revealed no suspicious mass or enhancement.   Bilateral diagnostic mammogram on 09/03/2019 revealed no evidence of malignancy.   She received radiation from 11/15/2016 - 01/08/2017.  She was Femara (01/17/2017 - 05/16/2017) secondary to arthralgias.  She discontinued Arimidex after 19 days (last 06/14/2017) secondary to fatigue and polyarthralgias.  She began Aromasin on 06/27/2017.   CA27.29 has been followed:  13.4 on 09/06/2016, 13.0 on 05/16/2017, 17.1 on 08/29/2017, 12.1 on 06/04/2018, 13.8 on 10/01/2018, and 15.6 on 02/04/2019.   Bone density study on 09/30/2015 revealed osteopenia with a T-score of -2.1 in the right femoral neck.  Bone density study revealed osteoporosis on 10/02/2017 with a T-score of -3.1. She began Prolia on 02/14/2017 (last 10/09/2019).   She was diagnosed with iron deficiency anemia and B12 deficiency on 08/29/2017.  TSH and folate were normal.  She is on oral B12 500 mcg a day.  Intrinsic factor and anti-parietal cell antibodies were normal on 01/02/2018.  B12 was 888 on 01/29/2018 and 544 on 02/04/2019.   She received Venofer weekly x 3 (10/31/2017 - 11/13/2017) and x 4 (06/05/2018 - 06/26/2018).   Ferritin has been followed: 10 on 08/29/2017, 174 on 12/12/2017, 74 on 01/02/2018,  64 on 01/29/2018, 3 on 06/04/2018, 205 on 10/01/2018, and 162 on 02/04/2019.   EGD and colonoscopy on 12/03/2017. Polyps were noted. Pathology was negative for dysplasia and malignancy.    She tested positive for COVID-19 on 02/14/2020.   Symptomatically, she is doing well.  She denies any breast concerns.  She has some hair thinning.   Exam reveals no evidence of recurrent disease.   Pl   Carcinoma of upper-outer quadrant of right breast in female, estrogen receptor positive (HCC)  08/24/2016 Initial Diagnosis   Carcinoma of upper-outer quadrant of breast in female, estrogen receptor positive (HCC)      HISTORY OF PRESENTING ILLNESS:  Allison Hall 80 y.o.  female patient with stage I multifocal ER/PR positive HER2 negative breast cancer currently on adjuvant Aromasin.  The patient at diagnosis was thought to be at high risk of of recurrence based on MammaPrint.  However based on second opinion and also patient preference chemotherapy was not offered.  Patient is currently on adjuvant aromatase inhibitor Aromasin.  Patient denies any worsening joint pains or bone pain.  However complains of hair loss.  Patient has been evaluated by dermatology.  Otherwise denies any nausea vomiting headaches.   Review of Systems  Constitutional:  Positive for malaise/fatigue. Negative for chills, diaphoresis, fever and weight loss.  HENT:  Negative for nosebleeds and sore throat.   Eyes:  Negative for double vision.  Respiratory:  Negative for cough, hemoptysis, sputum production, shortness of breath and wheezing.   Cardiovascular:  Negative for chest pain, palpitations, orthopnea and leg swelling.  Gastrointestinal:  Negative for abdominal pain, blood in stool, constipation, diarrhea, heartburn, melena, nausea and vomiting.  Genitourinary:  Negative for dysuria, frequency and urgency.  Musculoskeletal:  Positive for back pain and joint pain.  Skin: Negative.  Negative for itching and rash.  Neurological:  Negative for dizziness, tingling, focal weakness, weakness and headaches.  Endo/Heme/Allergies:  Does not bruise/bleed easily.       Hair loss.  Psychiatric/Behavioral:  Negative for depression. The patient is not nervous/anxious and does not have insomnia.     MEDICAL HISTORY:  Past Medical History:  Diagnosis Date    Anemia    in past   Anxiety    Breast cancer (HCC) 08/24/2016   right breast UOQ   Cancer (HCC)    skin ca   Colon polyp    COVID-19 02/14/2020   received treatment   Depression    GERD (gastroesophageal reflux disease)    Hypercholesteremia    Hypertension    Osteoarthritis    Personal history of radiation therapy 2018   RIGHT BREAST CA UOQ   Scoliosis of lumbar spine    Thyroid nodule 04/17/2016    SURGICAL HISTORY: Past Surgical History:  Procedure Laterality Date   ABDOMINAL HYSTERECTOMY     APPENDECTOMY     BREAST BIOPSY Right 08/24/2016   9:30 - invasive mammary carcinoma with lobular features   BREAST BIOPSY Right 08/24/2016   10:00 - invasive mammary carcinoma with lobular features   BREAST LUMPECTOMY Right 10/09/2016   INVASIVE CARCINOMA, TUBULO-LOBULAR VARIANT, CLEAR MARGINS, NEGATIVE LN'S   CATARACT EXTRACTION W/PHACO Left 04/06/2020   Procedure: CATARACT EXTRACTION PHACO AND INTRAOCULAR LENS PLACEMENT (IOC) LEFT;  Surgeon: Galen Manila, MD;  [facility];  Service: Ophthalmology;  Laterality: Left;  5.49 0:33.9   CATARACT EXTRACTION W/PHACO Right 04/20/2020   Procedure: CATARACT EXTRACTION PHACO AND INTRAOCULAR LENS PLACEMENT (IOC) RIGHT;  Surgeon: Galen Manila, MD;  Location: [place];  Service: Ophthalmology;  Laterality: Right;  7.26 0:47.2   COLONOSCOPY W/ POLYPECTOMY  2003, 2008, 2014   COLONOSCOPY WITH PROPOFOL N/A 12/03/2017   Procedure: COLONOSCOPY WITH PROPOFOL;  Surgeon: Manya Silvas, MD;  Location: [facility];  Service: Endoscopy;  Laterality: N/A;   ESOPHAGOGASTRODUODENOSCOPY     foreign body   ESOPHAGOGASTRODUODENOSCOPY (EGD) WITH PROPOFOL N/A 12/03/2017   Procedure: ESOPHAGOGASTRODUODENOSCOPY (EGD) WITH PROPOFOL;  Surgeon: Manya Silvas, MD;  Location: [facility] ENDOSCOPY;  Service: Endoscopy;  Laterality: N/A;   JOINT REPLACEMENT Right 2008   TKR   NOSE SURGERY     PARTIAL MASTECTOMY WITH NEEDLE  LOCALIZATION Right 09/29/2016   Procedure: PARTIAL MASTECTOMY WITH NEEDLE LOCALIZATION;  Surgeon: [name], MD;  Location: [facility];  Service: General;  Laterality: Right;   REPLACEMENT TOTAL KNEE Right 2008   SENTINEL NODE BIOPSY Right 09/29/2016   Procedure: SENTINEL NODE BIOPSY;  Surgeon: [name], MD;  Location: [facility];  Service: General;  Laterality: Right;    SOCIAL HISTORY: Social History   Socioeconomic History   Marital status: Married    Spouse name: Not on file   Number of children: Not on file   Years of education: Not on file   Highest education level: Not on file  Occupational History   Not on file  Tobacco Use   Smoking status: Former    Packs/day: 1.00    Years: 10.00    Pack years: 10.00    Types: Cigarettes    Quit date: 05/18/1965    Years since quitting: 55.4   Smokeless tobacco: Never  Vaping Use   Vaping Use: Never used  Substance and Sexual Activity   Alcohol use: Yes    Alcohol/week: 7.0 standard drinks    Types: 7 Glasses of wine per week   Drug use: No   Sexual activity: Not Currently  Other Topics Concern   Not on file  Social History Narrative   Not on file   Social Determinants of Health   Financial Resource Strain: Not on file  Food Insecurity: Not on file  Transportation Needs: Not on file  Physical Activity: Not on file  Stress: Not on file  Social Connections: Not on file  Intimate Partner Violence: Not on file    FAMILY HISTORY: Family History  Problem Relation Age of Onset   Hypertension Mother    Depression Mother    Heart failure Father    Obesity Brother    Heart disease Brother    Stroke Brother    Depression Brother    Breast cancer Cousin    Breast cancer Cousin    Breast cancer Cousin     ALLERGIES:  is allergic to sulfamethoxazole, other, statins, trazodone, ciprofloxacin, and sulfa antibiotics.  MEDICATIONS:  Current Outpatient Medications  Medication Sig Dispense Refill    acetaminophen (TYLENOL) 500 MG tablet Take 650 mg by mouth every 6 (six) hours as needed.     amLODipine (NORVASC) 10 MG tablet Take 1 tablet by mouth daily.     buPROPion (WELLBUTRIN XL) 300 MG 24 hr tablet Take by mouth.     busPIRone (BUSPAR) 10 MG tablet Take 1 tablet by mouth 2 (two) times daily.     Calcium Carb-Cholecalciferol 600-400 MG-UNIT TABS 1200 + 1000 D once daily     calcium carbonate 1250 MG capsule Take 1,250 mg by mouth daily.      casirivimab-imdevimab (REGEN-COV) 600-600 MG/10ML injection REGEN-COV (EUA) 60 mg-60  mg/mL intravenous solution  Administer four (4), 2.5 ml subcutaneous injections consecutively, each at different injection sites, into the though, back of upper arm, or abdomen.     Cholecalciferol (VITAMIN D3) 1000 units CAPS Take 2,000 Units by mouth 2 (two) times daily.      denosumab (PROLIA) 60 MG/ML SOLN injection Inject 60 mg into the skin every 6 (six) months. Administer in upper arm, thigh, or abdomen     exemestane (AROMASIN) 25 MG tablet TAKE 1 TABLET (25 MG TOTAL) BY MOUTH DAILY AFTER BREAKFAST. 90 tablet 3   lisinopril (ZESTRIL) 40 MG tablet Take 40 mg by mouth daily.     loperamide (IMODIUM) 2 MG capsule Take 2 mg by mouth as needed for diarrhea or loose stools.      Magnesium 250 MG TABS Take 500 mg by mouth daily.      omeprazole (PRILOSEC) 40 MG capsule Take by mouth.     rosuvastatin (CRESTOR) 5 MG tablet Take 5 mg by mouth daily.     Turmeric Curcumin 500 MG CAPS Take 1,500 mg by mouth daily.      vitamin B-12 (CYANOCOBALAMIN) 500 MCG tablet Take 1,000 mcg by mouth every other day.      busPIRone (BUSPAR) 10 MG tablet Take 10 mg by mouth 3 (three) times daily. (Patient not taking: Reported on 11/02/2020)     predniSONE (DELTASONE) 10 MG tablet 50 mg daily x 2 days, then 40 mg daily x 2 days, then 30 mg daily x 2 days, then 20 mg daily x 2 days, then 10 mg daily x 2 days. (Patient not taking: Reported on 11/02/2020) 30 tablet 0   sertraline (ZOLOFT) 100  MG tablet Take 1.5 tablets (150 mg total) by mouth daily. (Patient not taking: Reported on 11/02/2020) 45 tablet 1   No current facility-administered medications for this visit.      Marland Kitchen  PHYSICAL EXAMINATION: ECOG PERFORMANCE STATUS: 0 - Asymptomatic  Vitals:   11/02/20 0954  BP: 118/73  Pulse: 85  Resp: 18  Temp: (!) 96.9 F (36.1 C)  SpO2: 97%   Filed Weights   11/02/20 0954  Weight: 210 lb 13.9 oz (95.7 kg)    Physical Exam Vitals and nursing note reviewed.  Constitutional:      Comments: Alone.      HENT:     Head: Normocephalic and atraumatic.     Mouth/Throat:     Pharynx: Oropharynx is clear.  Eyes:     Extraocular Movements: Extraocular movements intact.     Pupils: Pupils are equal, round, and reactive to light.  Cardiovascular:     Rate and Rhythm: Normal rate and regular rhythm.  Pulmonary:     Comments: Decreased breath sounds bilaterally.  Abdominal:     Palpations: Abdomen is soft.  Musculoskeletal:        General: Normal range of motion.     Cervical back: Normal range of motion.  Skin:    General: Skin is warm.  Neurological:     General: No focal deficit present.     Mental Status: She is alert and oriented to person, place, and time.  Psychiatric:        Behavior: Behavior normal.        Judgment: Judgment normal.     LABORATORY DATA:  I have reviewed the data as listed Lab Results  Component Value Date   WBC 5.2 11/02/2020   HGB 12.3 11/02/2020   HCT 36.3 11/02/2020   MCV  94.5 11/02/2020   PLT 255 11/02/2020   Recent Labs    05/04/20 0935 11/02/20 0930  NA 140 138  K 3.8 4.1  CL 101 103  CO2 25 27  GLUCOSE 99 103*  BUN 21 18  CREATININE 1.27* 1.21*  CALCIUM 9.9 9.5  GFRNONAA 42* 45*  PROT 7.5 7.4  ALBUMIN 4.9 4.6  AST 22 21  ALT 18 16  ALKPHOS 43 40  BILITOT 0.5 0.8    RADIOGRAPHIC STUDIES: I have personally reviewed the radiological images as listed and agreed with the findings in the report. No results  found.  ASSESSMENT & PLAN:   Carcinoma of upper-outer quadrant of right breast in female, estrogen receptor positive ([place])  Multifocal RIGHT breast cancer ER Positive-   Clinically, she is doing well.except hair loss; clinically no evidence of recurrence.  Mammogram June 2021 normal.  Awaiting mammogram this year.  Ordered.  Continue Aromasin-tolerating well except for hair loss see below  # MSK-grade 1-2 secondary from Aromasin.  Stable.  #  Osteoporosis:The BMD measured at Forearm Radius 33% is 0.627 g/cm2 with a T-score of -2.8. Prolia today. .          # Hair loss: S/p dermatology evaluation; left likely secondary to Aromasin.  For now continue Aromasin            # DISPOSITION: # mammogram # Prolia today  # Follow up in  6 months for MD;  labs [facility] with diff, CMP, CA27.29], and Prolia-Dr.B      All questions were answered. The patient knows to call the clinic with any problems, questions or concerns.       Cammie Sickle, MD 11/02/2020 10:26 PM

## 2020-11-02 NOTE — Assessment & Plan Note (Addendum)
Multifocal RIGHT breast cancer ER Positive-   Clinically, she is doing well.except hair loss; clinically no evidence of recurrence.  Mammogram June 2021 normal.  Awaiting mammogram this year.  Ordered.  Continue Aromasin-tolerating well except for hair loss see below  # MSK-grade 1-2 secondary from Aromasin.  Stable.  #  Osteoporosis:The BMD measured at Forearm Radius 33% is 0.627 g/cm2 with a T-score of -2.8. Prolia today. .          # Hair loss: S/p dermatology evaluation; left likely secondary to Aromasin.  For now continue Aromasin            # DISPOSITION: # mammogram # Prolia today  # Follow up in  6 months for MD;  labs [facility] with diff, CMP, CA27.29], and Prolia-Dr.B

## 2020-11-03 LAB — CANCER ANTIGEN 27.29: CA 27.29: 18.1 U/mL (ref 0.0–38.6)

## 2020-11-19 ENCOUNTER — Other Ambulatory Visit: Payer: Self-pay | Admitting: *Deleted

## 2020-11-19 MED ORDER — EXEMESTANE 25 MG PO TABS: 25.0000 mg | ORAL_TABLET | Freq: Every day | ORAL | 3 refills | Status: DC

## 2020-11-19 NOTE — Telephone Encounter (Signed)
Patient requests refill for exemestane. She has a script sent to mail order but that will take two weeks to get filled. Order pended for MD approval.

## 2020-11-22 ENCOUNTER — Other Ambulatory Visit: Payer: Self-pay | Admitting: *Deleted

## 2020-11-22 MED ORDER — EXEMESTANE 25 MG PO TABS: 25.0000 mg | ORAL_TABLET | Freq: Every day | ORAL | 3 refills | Status: DC

## 2021-01-03 ENCOUNTER — Other Ambulatory Visit: Payer: Self-pay | Admitting: *Deleted

## 2021-01-03 MED ORDER — EXEMESTANE 25 MG PO TABS: 25.0000 mg | ORAL_TABLET | Freq: Every day | ORAL | 3 refills | Status: DC

## 2021-01-24 ENCOUNTER — Other Ambulatory Visit: Payer: Self-pay

## 2021-01-24 ENCOUNTER — Encounter: Payer: Self-pay | Admitting: Radiation Oncology

## 2021-01-24 ENCOUNTER — Ambulatory Visit
Admission: RE | Admit: 2021-01-24 | Discharge: 2021-01-24 | Disposition: A | Discharge status: Home / Self Care | Payer: Medicare HMO | Source: Ambulatory Visit | Attending: Radiation Oncology | Admitting: Radiation Oncology

## 2021-01-24 VITALS — BP 116/64 | HR 96 | Temp 98.3°F | Resp 18 | Wt 205.7 lb

## 2021-01-24 DIAGNOSIS — Z170 Estrogen receptor positive status [ER+]: Secondary | ICD-10-CM | POA: Insufficient documentation

## 2021-01-24 DIAGNOSIS — Z79811 Long term (current) use of aromatase inhibitors: Secondary | ICD-10-CM | POA: Diagnosis not present

## 2021-01-24 DIAGNOSIS — Z923 Personal history of irradiation: Secondary | ICD-10-CM | POA: Insufficient documentation

## 2021-01-24 DIAGNOSIS — C50411 Malignant neoplasm of upper-outer quadrant of right female breast: Secondary | ICD-10-CM | POA: Diagnosis not present

## 2021-01-24 NOTE — Progress Notes (Signed)
Radiation Oncology Follow up Note  Name: Allison Hall   Date:   01/24/2021 MRN:  [id number] DOB: Aug 23, 1940    This 80 y.o. female presents to the clinic today for 4-year follow-up status post whole breast radiation to her right breast for stage I ER/PR positive invasive mammary carcinoma.  REFERRING PROVIDER: Wardell Honour, MD  HPI: Patient is an 80 year old female now out over 4 years having completed whole breast radiation to her right breast for stage I ER/PR positive invasive mammary carcinoma.  Seen today in routine follow-up she is doing well.  She specifically denies breast tenderness cough or bone pain..  She is currently on Aromasin tolerant at well without side effect.  She had mammograms back in June which I have reviewed were BI-RADS 2 benign.  COMPLICATIONS OF TREATMENT: none  FOLLOW UP COMPLIANCE: keeps appointments   PHYSICAL EXAM:  BP 116/64 (BP Location: Left Arm, Patient Position: Sitting, Cuff Size: Large)   Pulse 96   Temp 98.3 F (36.8 C) (Tympanic)   Resp 18   Wt 205 lb 11.2 oz (93.3 kg)   SpO2 100%   BMI 35.31 kg/m  Lungs are clear to A&P cardiac examination essentially unremarkable with regular rate and rhythm. No dominant mass or nodularity is noted in either breast in 2 positions examined. Incision is well-healed. No axillary or supraclavicular adenopathy is appreciated. Cosmetic result is excellent.  Well-developed well-nourished patient in NAD. HEENT reveals PERLA, EOMI, discs not visualized.  Oral cavity is clear. No oral mucosal lesions are identified. Neck is clear without evidence of cervical or supraclavicular adenopathy. Lungs are clear to A&P. Cardiac examination is essentially unremarkable with regular rate and rhythm without murmur rub or thrill. Abdomen is benign with no organomegaly or masses noted. Motor sensory and DTR levels are equal and symmetric in the upper and lower extremities. Cranial nerves II through XII are grossly intact.  Proprioception is intact. No peripheral adenopathy or edema is identified. No motor or sensory levels are noted. Crude visual fields are within normal range.  RADIOLOGY RESULTS: Mammograms reviewed compatible with above-stated findings  PLAN: Present time patient continues to do well now out over 4 years from whole breast radiation.  At this time I am going to discontinue her follow-up care.  She continues close follow-up care with medical oncology she is recently switched to Dr. Jacinto Reap.  Patient knows to call with any concerns at any time.  I would like to take this opportunity to thank you for allowing me to participate in the care of your patient.Noreene Filbert, MD

## 2021-04-07 ENCOUNTER — Telehealth: Payer: Self-pay | Admitting: *Deleted

## 2021-04-07 NOTE — Telephone Encounter (Addendum)
Patient called stating that she needs [place] to get her a Tier reduction for her Exemestane like we do every year so her copay is lower

## 2021-04-07 NOTE — Telephone Encounter (Signed)
Spoke with pt's insurance [phone]). Tier reduction verbally submitted with insurance. Will take 24 hours for final decision. Representative stated that the tier exception is most likely subject to approval as patient has had it approved in the past at tier one. Pt will receive a letter in the mail form her insurance to validate the approval.  I spoke with the patient. She stated that she goes through this on an annual basis for the last 4 years. Apparently her insurance requires the provider's office to call every year to validate the tier exception. She thanked me for updating her on this process and advocating on her behalf.

## 2021-05-03 ENCOUNTER — Inpatient Hospital Stay: Payer: Medicare HMO | Admitting: Internal Medicine

## 2021-05-03 ENCOUNTER — Inpatient Hospital Stay: Payer: Medicare HMO

## 2021-05-03 ENCOUNTER — Telehealth: Payer: Self-pay | Admitting: *Deleted

## 2021-05-03 NOTE — Telephone Encounter (Signed)
Patient called asking if it would be possible to get her Prolia at pharmacy and bring medicine to cancer center to be injected because it would be cheaper or was that even allowed. Please advise

## 2021-05-03 NOTE — Telephone Encounter (Signed)
Discussed with patient and she has opted to continue getting her injections at the office ass she has been doing

## 2021-05-03 NOTE — Telephone Encounter (Signed)
Not sure if that is a medication that can be obtained at her local pharmacy.  I have also checked with [facility] and we can't administer injections brought in by patients.  It is recommended she continue to receive Prolia injections at the [facility].

## 2021-05-13 ENCOUNTER — Other Ambulatory Visit: Payer: Self-pay

## 2021-05-13 ENCOUNTER — Inpatient Hospital Stay: Payer: Medicare HMO

## 2021-05-13 ENCOUNTER — Inpatient Hospital Stay: Payer: Medicare HMO | Admitting: Internal Medicine

## 2021-05-13 ENCOUNTER — Encounter: Payer: Self-pay | Admitting: Internal Medicine

## 2021-05-13 ENCOUNTER — Inpatient Hospital Stay: Payer: Medicare HMO | Attending: Internal Medicine

## 2021-05-13 VITALS — BP 143/84 | HR 88 | Temp 98.1°F | Ht 64.0 in | Wt 211.8 lb

## 2021-05-13 DIAGNOSIS — M85851 Other specified disorders of bone density and structure, right thigh: Secondary | ICD-10-CM | POA: Diagnosis not present

## 2021-05-13 DIAGNOSIS — Z79811 Long term (current) use of aromatase inhibitors: Secondary | ICD-10-CM | POA: Insufficient documentation

## 2021-05-13 DIAGNOSIS — Z79899 Other long term (current) drug therapy: Secondary | ICD-10-CM | POA: Diagnosis not present

## 2021-05-13 DIAGNOSIS — C50411 Malignant neoplasm of upper-outer quadrant of right female breast: Secondary | ICD-10-CM | POA: Diagnosis not present

## 2021-05-13 DIAGNOSIS — C50412 Malignant neoplasm of upper-outer quadrant of left female breast: Secondary | ICD-10-CM | POA: Diagnosis not present

## 2021-05-13 DIAGNOSIS — M810 Age-related osteoporosis without current pathological fracture: Secondary | ICD-10-CM | POA: Diagnosis not present

## 2021-05-13 DIAGNOSIS — Z170 Estrogen receptor positive status [ER+]: Secondary | ICD-10-CM | POA: Insufficient documentation

## 2021-05-13 LAB — COMPREHENSIVE METABOLIC PANEL
ALT: 19 U/L (ref 0–44)
AST: 20 U/L (ref 15–41)
Albumin: 4.7 g/dL (ref 3.5–5.0)
Alkaline Phosphatase: 47 U/L (ref 38–126)
Anion gap: 6 (ref 5–15)
BUN: 23 mg/dL (ref 8–23)
CO2: 30 mmol/L (ref 22–32)
Calcium: 9.6 mg/dL (ref 8.9–10.3)
Chloride: 105 mmol/L (ref 98–111)
Creatinine, Ser: 1.03 mg/dL — ABNORMAL HIGH (ref 0.44–1.00)
GFR, Estimated: 54 mL/min — ABNORMAL LOW (ref >60)
Glucose, Bld: 91 mg/dL (ref 70–99)
Potassium: 3.9 mmol/L (ref 3.5–5.1)
Sodium: 141 mmol/L (ref 135–145)
Total Bilirubin: 0.5 mg/dL (ref 0.3–1.2)
Total Protein: 7.3 g/dL (ref 6.5–8.1)

## 2021-05-13 LAB — CBC WITH DIFFERENTIAL/PLATELET
Abs Immature Granulocytes: 0.07 10*3/uL (ref 0.00–0.07)
Basophils Absolute: 0 10*3/uL (ref 0.0–0.1)
Basophils Relative: 1 %
Eosinophils Absolute: 0.1 10*3/uL (ref 0.0–0.5)
Eosinophils Relative: 2 %
HCT: 36.1 % (ref 36.0–46.0)
Hemoglobin: 11.8 g/dL — ABNORMAL LOW (ref 12.0–15.0)
Immature Granulocytes: 1 %
Lymphocytes Relative: 26 %
Lymphs Abs: 1.6 10*3/uL (ref 0.7–4.0)
MCH: 31.6 pg (ref 26.0–34.0)
MCHC: 32.7 g/dL (ref 30.0–36.0)
MCV: 96.8 fL (ref 80.0–100.0)
Monocytes Absolute: 0.7 10*3/uL (ref 0.1–1.0)
Monocytes Relative: 11 %
Neutro Abs: 3.6 10*3/uL (ref 1.7–7.7)
Neutrophils Relative %: 59 %
Platelets: 234 10*3/uL (ref 150–400)
RBC: 3.73 MIL/uL — ABNORMAL LOW (ref 3.87–5.11)
RDW: 13.7 % (ref 11.5–15.5)
WBC: 6.1 10*3/uL (ref 4.0–10.5)
nRBC: 0 % (ref 0.0–0.2)

## 2021-05-13 MED ORDER — DENOSUMAB 60 MG/ML ~~LOC~~ SOSY
60.0000 mg | PREFILLED_SYRINGE | Freq: Once | SUBCUTANEOUS | Status: AC
  Administered 2021-05-13: 60 mg via SUBCUTANEOUS
  Filled 2021-05-13: qty 1

## 2021-05-13 NOTE — Assessment & Plan Note (Signed)
Multifocal RIGHT breast cancer ER Positive-stage I-high risk MammaPrint; no chemotherapy []  second opinion at [facility];Dr.Marcom].  Discussed regarding extended AI-patient interested.   #  Clinically no evidence of recurrence.  Mammogram June 2022 normal.  Awaiting mammogram this year.  Ordered.  Continue Aromasin-tolerating well except for hair loss see below  # MSK/Hx of OA-[s/p knee steroids-]-grade 1-2 secondary from Aromasin.  Stable.  #  Osteoporosis:The BMD [July 2021] T-score of -2.8. Prolia today; BMD-   # Anemia- mild Hb 11.8; recommend PO Iron. I had a long discussion with the patient regarding various forms of dietary iron as it is present universally in meat.  Also discussed regarding plant sources-including but not limited to leafy vegetables [spinach], broccoli; Discussed regarding oral iron supplementation-i ironbiglycinate [20 mg elemental iron;; less gastric discomfort/constipation]. Patient is intolerant to oral iron/or iron levels do not improve on oral supplementation-recommend IV iron infusions. Hx of Iron infusion IV in past.   # CKD- III- stable.  .          # Hair loss: S/p dermatology evaluation; felt likely secondary to Aromasin; recommended rogain; did not try.            *mychart # DISPOSITION: # Prolia today  # Follow up in  6 months for MD;  labs [facility] with diff, CMP, CA27.29], and Prolia; BMD [along with mammo in June 2023]-Dr.B

## 2021-05-13 NOTE — Patient Instructions (Signed)
#  Recommend gentle iron 1 pill a day; should not upset your stomach or cause constipation.  Talk to the pharmacist if you can find it/it is over-the-counter.  

## 2021-05-13 NOTE — Progress Notes (Signed)
[place] NOTE  Patient Care Team: Wardell Honour, MD as PCP - General (Family Medicine) [name], MD as Referring Physician (Surgery)  CHIEF COMPLAINTS/PURPOSE OF CONSULTATION:  Breast cancer  #  Oncology History Overview Note  Sela Hall, [place] [id number]      Assessment:  [place] Hall is a 81 y.o. female with multi-focal right breast cancer s/p partial mastectomy with sentinel lymph node biopsy on 09/29/2016.  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  Three sentinel lymph nodes were negative.  Tumor was ER + (> 90%), PR + (> 90%) and Her2/neu 2+ (negative by FISH).  Pathologic stage was T1c(m) N0(sn).  CA27.29 was 13.4 on 09/06/2016.   MammaPrint revealed a 29% risk of recurrence (high).  She was seen for second opinion at [facility] on 10/25/2016.  Chemotherapy was not recommended.   Bilateral screening mammogram on 07/24/2016 revealed a possible area of distortion in the right breast. Diagnostic mammogram on 08/10/2016 revealed 2 suspicious masses in the upper-outer quadrant of the right breast in a broad area of confluent distortion in the upper-outer quadrant of the right breast.  There are at least 3 areas of confluent distortion spanning approximately 6.5 cm of tissue.  Ultrasound on 07/31/2016 revealed  an irregular hypoechoic 1.2 x 1.1 x 1.2 cm at the 9:30 position 7 cm from the nipple.  In addition, there was an irregular 1.9 x 0.8 x 1.2 cm mass at the 10 o'clock position 5 cm from the nipple.  There were multiple small normal-appearing lymph nodes.   Breast MRI on 09/16/2016 revealed an irregular 1.6 x 1.2 x 1.6 cm enhancing mass adjacent to the clip artifact within the upper, outer right breast, middle depth, corresponding to the mass seen at 10 o'clock. There was an additional irregular enhancing mass adjacent to the more posterior clip artifact  measuring 2.6 x 1.4 x 1.6 cm, corresponding  to the mass seen at 9:30 position. Together, these 2 masses span 6.3 cm of breast tissue from anterior to posterior. There was no definite additional suspicious area of enhancement within the right breast to correspond to the third area of distortion identified on diagnostic mammogram dated 08/10/2016.  Left breast revealed no suspicious mass or enhancement.   Bilateral diagnostic mammogram on 09/03/2019 revealed no evidence of malignancy.   She received radiation from 11/15/2016 - 01/08/2017.  She was Femara (01/17/2017 - 05/16/2017) secondary to arthralgias.  She discontinued Arimidex after 19 days (last 06/14/2017) secondary to fatigue and polyarthralgias.  She began Aromasin on 06/27/2017.   CA27.29 has been followed:  13.4 on 09/06/2016, 13.0 on 05/16/2017, 17.1 on 08/29/2017, 12.1 on 06/04/2018, 13.8 on 10/01/2018, and 15.6 on 02/04/2019.   Bone density study on 09/30/2015 revealed osteopenia with a T-score of -2.1 in the right femoral neck.  Bone density study revealed osteoporosis on 10/02/2017 with a T-score of -3.1. She began Prolia on 02/14/2017 (last 10/09/2019).   She was diagnosed with iron deficiency anemia and B12 deficiency on 08/29/2017.  TSH and folate were normal.  She is on oral B12 500 mcg a day.  Intrinsic factor and anti-parietal cell antibodies were normal on 01/02/2018.  B12 was 888 on 01/29/2018 and 544 on 02/04/2019.   She received Venofer weekly x 3 (10/31/2017 - 11/13/2017) and x 4 (06/05/2018 - 06/26/2018).   Ferritin has been followed: 10 on 08/29/2017, 174 on 12/12/2017, 74 on 01/02/2018,  64 on 01/29/2018, 3 on 06/04/2018, 205 on 10/01/2018, and 162 on 02/04/2019.   EGD and colonoscopy on 12/03/2017. Polyps were noted. Pathology was negative for dysplasia and malignancy.    She tested positive for COVID-19 on 02/14/2020.   Symptomatically, she is doing well.  She denies any breast concerns.  She has some hair thinning.   Exam reveals no evidence of recurrent disease.   Pl   Carcinoma of upper-outer quadrant of right breast in female, estrogen receptor positive ([place])  08/24/2016 Initial Diagnosis   Carcinoma of upper-outer quadrant of breast in female, estrogen receptor positive ([place])      HISTORY OF PRESENTING ILLNESS: Alone.  Ambulating independently. [place] Hall 81 y.o.  female patient with stage I multifocal ER/PR positive HER2 negative breast cancer currently on adjuvant Aromasin [on Extended; Mammaprint-high risk]  Patient continues to have hair loss.  She has been recommended Rogaine by dermatology.  However not started.  Chronic joint pains not any worse.Otherwise denies any nausea vomiting headaches.   Review of Systems  Constitutional:  Positive for malaise/fatigue. Negative for chills, diaphoresis, fever and weight loss.  HENT:  Negative for nosebleeds and sore throat.   Eyes:  Negative for double vision.  Respiratory:  Negative for cough, hemoptysis, sputum production, shortness of breath and wheezing.   Cardiovascular:  Negative for chest pain, palpitations, orthopnea and leg swelling.  Gastrointestinal:  Negative for abdominal pain, blood in stool, constipation, diarrhea, heartburn, melena, nausea and vomiting.  Genitourinary:  Negative for dysuria, frequency and urgency.  Musculoskeletal:  Positive for back pain and joint pain.  Skin: Negative.  Negative for itching and rash.  Neurological:  Negative for dizziness, tingling, focal weakness, weakness and headaches.  Endo/Heme/Allergies:  Does not bruise/bleed easily.       Hair loss.  Psychiatric/Behavioral:  Negative for depression. The patient is not nervous/anxious and does not have insomnia.     MEDICAL HISTORY:  Past Medical History:  Diagnosis Date   Anemia    in past   Anxiety    Breast cancer ([place]) 08/24/2016   right breast UOQ   Cancer ([place])    skin ca   Colon polyp    COVID-19 02/14/2020   received  treatment   Depression    GERD (gastroesophageal reflux disease)    Hypercholesteremia    Hypertension    Osteoarthritis    Personal history of radiation therapy 2018   RIGHT BREAST CA UOQ   Scoliosis of lumbar spine    Thyroid nodule 04/17/2016    SURGICAL HISTORY: Past Surgical History:  Procedure Laterality Date   ABDOMINAL HYSTERECTOMY     APPENDECTOMY     BREAST BIOPSY Right 08/24/2016   9:30 - invasive mammary carcinoma with lobular features   BREAST BIOPSY Right 08/24/2016   10:00 - invasive mammary carcinoma with lobular features   BREAST LUMPECTOMY Right 10/09/2016   INVASIVE CARCINOMA, TUBULO-LOBULAR VARIANT, CLEAR MARGINS, NEGATIVE LN'S   CATARACT EXTRACTION W/PHACO Left 04/06/2020   Procedure: CATARACT EXTRACTION PHACO AND INTRAOCULAR LENS PLACEMENT ([place]) LEFT;  Surgeon: Birder Robson, MD;  Location: [place];  Service: Ophthalmology;  Laterality: Left;  5.49 0:33.9   CATARACT EXTRACTION W/PHACO Right 04/20/2020   Procedure: CATARACT EXTRACTION PHACO AND INTRAOCULAR LENS PLACEMENT ([place]) RIGHT;  Surgeon: Birder Robson, MD;  Location: [place];  Service: Ophthalmology;  Laterality: Right;  7.26 0:47.2   COLONOSCOPY W/ POLYPECTOMY  2003, 2008, 2014   COLONOSCOPY WITH PROPOFOL N/A 12/03/2017   Procedure: COLONOSCOPY  WITH PROPOFOL;  Surgeon: Manya Silvas, MD;  Location: [facility] ENDOSCOPY;  Service: Endoscopy;  Laterality: N/A;   ESOPHAGOGASTRODUODENOSCOPY     foreign body   ESOPHAGOGASTRODUODENOSCOPY (EGD) WITH PROPOFOL N/A 12/03/2017   Procedure: ESOPHAGOGASTRODUODENOSCOPY (EGD) WITH PROPOFOL;  Surgeon: Manya Silvas, MD;  Location: [facility] ENDOSCOPY;  Service: Endoscopy;  Laterality: N/A;   JOINT REPLACEMENT Right 2008   TKR   NOSE SURGERY     PARTIAL MASTECTOMY WITH NEEDLE LOCALIZATION Right 09/29/2016   Procedure: PARTIAL MASTECTOMY WITH NEEDLE LOCALIZATION;  Surgeon: [name], MD;  Location: [facility] ORS;  Service: General;   Laterality: Right;   REPLACEMENT TOTAL KNEE Right 2008   SENTINEL NODE BIOPSY Right 09/29/2016   Procedure: SENTINEL NODE BIOPSY;  Surgeon: [name], MD;  Location: [facility] ORS;  Service: General;  Laterality: Right;    SOCIAL HISTORY: Social History   Socioeconomic History   Marital status: Married    Spouse name: Not on file   Number of children: Not on file   Years of education: Not on file   Highest education level: Not on file  Occupational History   Not on file  Tobacco Use   Smoking status: Former    Packs/day: 1.00    Years: 10.00    Pack years: 10.00    Types: Cigarettes    Quit date: 05/18/1965    Years since quitting: 56.0   Smokeless tobacco: Never  Vaping Use   Vaping Use: Never used  Substance and Sexual Activity   Alcohol use: Yes    Alcohol/week: 7.0 standard drinks    Types: 7 Glasses of wine per week   Drug use: No   Sexual activity: Not Currently  Other Topics Concern   Not on file  Social History Narrative   Not on file   Social Determinants of Health   Financial Resource Strain: Not on file  Food Insecurity: Not on file  Transportation Needs: Not on file  Physical Activity: Not on file  Stress: Not on file  Social Connections: Not on file  Intimate Partner Violence: Not on file    FAMILY HISTORY: Family History  Problem Relation Age of Onset   Hypertension Mother    Depression Mother    Heart failure Father    Obesity Brother    Heart disease Brother    Stroke Brother    Depression Brother    Breast cancer Cousin    Breast cancer Cousin    Breast cancer Cousin     ALLERGIES:  is allergic to sulfamethoxazole, other, statins, trazodone, ciprofloxacin, and sulfa antibiotics.  MEDICATIONS:  Current Outpatient Medications  Medication Sig Dispense Refill   acetaminophen (TYLENOL) 500 MG tablet Take 650 mg by mouth every 6 (six) hours as needed.     amLODipine (NORVASC) 10 MG tablet Take 1 tablet by mouth daily.     Ascorbic  Acid (VITAMIN C) 100 MG tablet Take 100 mg by mouth daily.     buPROPion (WELLBUTRIN XL) 300 MG 24 hr tablet Take by mouth.     busPIRone (BUSPAR) 10 MG tablet Take 10 mg by mouth 3 (three) times daily.     busPIRone (BUSPAR) 10 MG tablet Take 1 tablet by mouth 2 (two) times daily.     Calcium Carb-Cholecalciferol 600-400 MG-UNIT TABS 1200 + 1000 D once daily     calcium carbonate 1250 MG capsule Take 1,250 mg by mouth daily.      casirivimab-imdevimab (REGEN-COV) 600-600 MG/10ML injection REGEN-COV (EUA)  60 mg-60 mg/mL intravenous solution  Administer four (4), 2.5 ml subcutaneous injections consecutively, each at different injection sites, into the though, back of upper arm, or abdomen.     Cholecalciferol (VITAMIN D3) 1000 units CAPS Take 2,000 Units by mouth 2 (two) times daily.      denosumab (PROLIA) 60 MG/ML SOLN injection Inject 60 mg into the skin every 6 (six) months. Administer in upper arm, thigh, or abdomen     exemestane (AROMASIN) 25 MG tablet Take 1 tablet (25 mg total) by mouth daily after breakfast. 90 tablet 3   lisinopril (ZESTRIL) 40 MG tablet Take 40 mg by mouth daily.     loperamide (IMODIUM) 2 MG capsule Take 2 mg by mouth as needed for diarrhea or loose stools.      Magnesium 250 MG TABS Take 500 mg by mouth daily.      omeprazole (PRILOSEC) 40 MG capsule Take by mouth.     Turmeric Curcumin 500 MG CAPS Take 1,500 mg by mouth daily.      vitamin B-12 (CYANOCOBALAMIN) 500 MCG tablet Take 1,000 mcg by mouth every other day.      rosuvastatin (CRESTOR) 5 MG tablet Take 5 mg by mouth daily.     sertraline (ZOLOFT) 100 MG tablet Take 1.5 tablets (150 mg total) by mouth daily. (Patient not taking: Reported on 11/02/2020) 45 tablet 1   No current facility-administered medications for this visit.      Marland Kitchen  PHYSICAL EXAMINATION: ECOG PERFORMANCE STATUS: 0 - Asymptomatic  Vitals:   05/13/21 1509  BP: (!) 143/84  Pulse: 88  Temp: 98.1 F (36.7 C)  SpO2: 99%   Filed  Weights   05/13/21 1509  Weight: 211 lb 12.8 oz (96.1 kg)    Physical Exam Vitals and nursing note reviewed.  Constitutional:      Comments: Alone.      HENT:     Head: Normocephalic and atraumatic.     Mouth/Throat:     Pharynx: Oropharynx is clear.  Eyes:     Extraocular Movements: Extraocular movements intact.     Pupils: Pupils are equal, round, and reactive to light.  Cardiovascular:     Rate and Rhythm: Normal rate and regular rhythm.  Pulmonary:     Comments: Decreased breath sounds bilaterally.  Abdominal:     Palpations: Abdomen is soft.  Musculoskeletal:        General: Normal range of motion.     Cervical back: Normal range of motion.  Skin:    General: Skin is warm.  Neurological:     General: No focal deficit present.     Mental Status: She is alert and oriented to person, place, and time.  Psychiatric:        Behavior: Behavior normal.        Judgment: Judgment normal.     LABORATORY DATA:  I have reviewed the data as listed Lab Results  Component Value Date   WBC 6.1 05/13/2021   HGB 11.8 (L) 05/13/2021   HCT 36.1 05/13/2021   MCV 96.8 05/13/2021   PLT 234 05/13/2021   Recent Labs    11/02/20 0930 05/13/21 1434  NA 138 141  K 4.1 3.9  CL 103 105  CO2 27 30  GLUCOSE 103* 91  BUN 18 23  CREATININE 1.21* 1.03*  CALCIUM 9.5 9.6  GFRNONAA 45* 54*  PROT 7.4 7.3  ALBUMIN 4.6 4.7  AST 21 20  ALT 16 19  ALKPHOS 40 47  BILITOT 0.8 0.5    RADIOGRAPHIC STUDIES: I have personally reviewed the radiological images as listed and agreed with the findings in the report. No results found.  ASSESSMENT & PLAN:   Carcinoma of upper-outer quadrant of right breast in female, estrogen receptor positive ([place])  Multifocal RIGHT breast cancer ER Positive-stage I-high risk MammaPrint; no chemotherapy [] second opinion at [facility];Dr.Marcom].  Discussed regarding extended AI-patient interested.   #  Clinically no evidence of recurrence.  Mammogram June  2022 normal.  Awaiting mammogram this year.  Ordered.  Continue Aromasin-tolerating well except for hair loss see below  # MSK/Hx of OA-[s/p knee steroids-]-grade 1-2 secondary from Aromasin.  Stable.  #  Osteoporosis:The BMD [July 2021] T-score of -2.8. Prolia today; BMD-   # Anemia- mild Hb 11.8; recommend PO Iron. I had a long discussion with the patient regarding various forms of dietary iron as it is present universally in meat.  Also discussed regarding plant sources-including but not limited to leafy vegetables [spinach], broccoli; Discussed regarding oral iron supplementation-i ironbiglycinate [20 mg elemental iron;; less gastric discomfort/constipation]. Patient is intolerant to oral iron/or iron levels do not improve on oral supplementation-recommend IV iron infusions. Hx of Iron infusion IV in past.   # CKD- III- stable.  .          # Hair loss: S/p dermatology evaluation; felt likely secondary to Aromasin; recommended rogain; did not try.            *mychart # DISPOSITION: # Prolia today  # Follow up in  6 months for MD;  labs [facility] with diff, CMP, CA27.29], and Prolia; BMD [along with mammo in June 2023]-Dr.B       All questions were answered. The patient knows to call the clinic with any problems, questions or concerns.       Cammie Sickle, MD 05/13/2021 4:31 PM

## 2021-05-13 NOTE — Progress Notes (Signed)
Pt c/o losing hair.

## 2021-05-14 LAB — CANCER ANTIGEN 27.29: CA 27.29: 12.3 U/mL (ref 0.0–38.6)

## 2021-08-10 ENCOUNTER — Telehealth: Payer: Self-pay | Admitting: *Deleted

## 2021-08-10 NOTE — Telephone Encounter (Signed)
Patient called asking about her follow up appointment as she is supposed to come every 6 months and she does not have an appointment Per Feb disposition: ?Follow-up and Disposition History ? ?05/13/2021 1541 - Cammie Sickle, MD  ?Check-out note: # DISPOSITION:  ?# Prolia today  ?# Follow up in  6 months for MD;  labs [facility] with diff, CMP, CA27.29], and Prolia; BMD [along with mammo in June 2023]-Dr.B  ? ?

## 2021-08-28 ENCOUNTER — Encounter: Payer: Self-pay | Admitting: Emergency Medicine

## 2021-08-28 ENCOUNTER — Ambulatory Visit
Admission: EM | Admit: 2021-08-28 | Discharge: 2021-08-28 | Disposition: A | Discharge status: Home / Self Care | Payer: Medicare HMO | Attending: Emergency Medicine | Admitting: Emergency Medicine

## 2021-08-28 DIAGNOSIS — M25551 Pain in right hip: Secondary | ICD-10-CM | POA: Diagnosis not present

## 2021-08-28 MED ORDER — METHYLPREDNISOLONE 4 MG PO TBPK: ORAL_TABLET | ORAL | 0 refills | Status: DC

## 2021-08-28 MED ORDER — BACLOFEN 10 MG PO TABS: 10.0000 mg | ORAL_TABLET | Freq: Three times a day (TID) | ORAL | 0 refills | Status: DC

## 2021-08-28 NOTE — ED Provider Notes (Signed)
[facility]    CSN: [id number] Arrival date & time: 08/28/21  1515      History   Chief Complaint Chief Complaint  Patient presents with   Hip Pain    right    HPI Allison Hall is a 81 y.o. female.   HPI  81 year old female here for evaluation of right hip pain.  Patient reports that she was standing earlier at church with most of her weight on her right foot.  She leaned to the right and she felt a pull in the top of her right buttock.  She states that she drove home and a applied ice and elevated her right leg.  She took some Tylenol just prior to coming to the urgent care.  She states that the pain has continued to get worse and she wanted to have it evaluated.  She states that the pain increases when she goes up and down stairs or puts increased weight on her right leg.  She is able to ambulate.  She denies any numbness or tingling in her right leg.  Past Medical History:  Diagnosis Date   Anemia    in past   Anxiety    Breast cancer ([place]) 08/24/2016   right breast UOQ   Cancer ([place])    skin ca   Colon polyp    COVID-19 02/14/2020   received treatment   Depression    GERD (gastroesophageal reflux disease)    Hypercholesteremia    Hypertension    Osteoarthritis    Personal history of radiation therapy 2018   RIGHT BREAST CA UOQ   Scoliosis of lumbar spine    Thyroid nodule 04/17/2016    Patient Active Problem List   Diagnosis Date Noted   Hair thinning 05/04/2020   Chronic pain of left knee 04/04/2018   Chronic pain syndrome 04/04/2018   Iron deficiency anemia 10/31/2017   B12 deficiency 10/31/2017   Arthralgia 06/03/2017   Osteoporosis 10/04/2016   Carcinoma of upper-outer quadrant of right breast in female, estrogen receptor positive ([place]) 08/24/2016   Closed fracture of distal end of ulna 06/01/2015   Triggering of digit 05/27/2014   Hamstring muscle strain 05/26/2014   Arthritis of knee, degenerative 03/26/2014   Long term  current use of opiate analgesic 04/21/2013   Arthritis, degenerative 04/21/2013   Anxiety 10/05/2011   Colon polyp 10/05/2011   Esophageal foreign body 10/05/2011   Acid reflux 10/05/2011   BP (high blood pressure) 10/05/2011   Hypercholesterolemia 10/05/2011    Past Surgical History:  Procedure Laterality Date   ABDOMINAL HYSTERECTOMY     APPENDECTOMY     BREAST BIOPSY Right 08/24/2016   9:30 - invasive mammary carcinoma with lobular features   BREAST BIOPSY Right 08/24/2016   10:00 - invasive mammary carcinoma with lobular features   BREAST LUMPECTOMY Right 10/09/2016   INVASIVE CARCINOMA, TUBULO-LOBULAR VARIANT, CLEAR MARGINS, NEGATIVE LN'S   CATARACT EXTRACTION W/PHACO Left 04/06/2020   Procedure: CATARACT EXTRACTION PHACO AND INTRAOCULAR LENS PLACEMENT ([place]) LEFT;  Surgeon: Birder Robson, MD;  Location: [place];  Service: Ophthalmology;  Laterality: Left;  5.49 0:33.9   CATARACT EXTRACTION W/PHACO Right 04/20/2020   Procedure: CATARACT EXTRACTION PHACO AND INTRAOCULAR LENS PLACEMENT ([place]) RIGHT;  Surgeon: Birder Robson, MD;  Location: [place];  Service: Ophthalmology;  Laterality: Right;  7.26 0:47.2   COLONOSCOPY W/ POLYPECTOMY  2003, 2008, 2014   COLONOSCOPY WITH PROPOFOL N/A 12/03/2017   Procedure: COLONOSCOPY WITH PROPOFOL;  Surgeon: Vira Agar,  Gavin Pound, MD;  Location: [facility];  Service: Endoscopy;  Laterality: N/A;   ESOPHAGOGASTRODUODENOSCOPY     foreign body   ESOPHAGOGASTRODUODENOSCOPY (EGD) WITH PROPOFOL N/A 12/03/2017   Procedure: ESOPHAGOGASTRODUODENOSCOPY (EGD) WITH PROPOFOL;  Surgeon: Manya Silvas, MD;  Location: [facility];  Service: Endoscopy;  Laterality: N/A;   JOINT REPLACEMENT Right 2008   TKR   NOSE SURGERY     PARTIAL MASTECTOMY WITH NEEDLE LOCALIZATION Right 09/29/2016   Procedure: PARTIAL MASTECTOMY WITH NEEDLE LOCALIZATION;  Surgeon: [name], MD;  Location: [facility];  Service: General;  Laterality:  Right;   REPLACEMENT TOTAL KNEE Right 2008   SENTINEL NODE BIOPSY Right 09/29/2016   Procedure: SENTINEL NODE BIOPSY;  Surgeon: [name], MD;  Location: [facility];  Service: General;  Laterality: Right;    OB History   No obstetric history on file.      Home Medications    Prior to Admission medications   Medication Sig Start Date End Date Taking? Authorizing Provider  baclofen (LIORESAL) 10 MG tablet Take 1 tablet (10 mg total) by mouth 3 (three) times daily. 08/28/21  Yes Margarette Canada, NP  methylPREDNISolone (MEDROL DOSEPAK) 4 MG TBPK tablet Take according to the package insert. 08/28/21  Yes Margarette Canada, NP  acetaminophen (TYLENOL) 500 MG tablet Take 650 mg by mouth every 6 (six) hours as needed.    [provider]  amLODipine (NORVASC) 10 MG tablet Take 1 tablet by mouth daily. 07/15/20   [provider]  Ascorbic Acid (VITAMIN C) 100 MG tablet Take 100 mg by mouth daily.    [provider]  buPROPion (WELLBUTRIN XL) 300 MG 24 hr tablet Take by mouth. 07/15/20   [provider]  busPIRone (BUSPAR) 10 MG tablet Take 10 mg by mouth 3 (three) times daily. 04/17/16   [provider]  busPIRone (BUSPAR) 10 MG tablet Take 1 tablet by mouth 2 (two) times daily. 07/15/20   [provider]  Calcium Carb-Cholecalciferol 600-400 MG-UNIT TABS 1200 + 1000 D once daily    [provider]  calcium carbonate 1250 MG capsule Take 1,250 mg by mouth daily.     [provider]  casirivimab-imdevimab (REGEN-COV) 600-600 MG/10ML injection REGEN-COV (EUA) 60 mg-60 mg/mL intravenous solution  Administer four (4), 2.5 ml subcutaneous injections consecutively, each at different injection sites, into the though, back of upper arm, or abdomen.    [provider]  Cholecalciferol (VITAMIN D3) 1000 units CAPS Take 2,000 Units by mouth 2 (two) times daily.     [provider]  denosumab (PROLIA) 60 MG/ML SOLN injection  Inject 60 mg into the skin every 6 (six) months. Administer in upper arm, thigh, or abdomen    [provider]  exemestane (AROMASIN) 25 MG tablet Take 1 tablet (25 mg total) by mouth daily after breakfast. 01/03/21   Cammie Sickle, MD  lisinopril (ZESTRIL) 40 MG tablet Take 40 mg by mouth daily. 11/24/18   [provider]  loperamide (IMODIUM) 2 MG capsule Take 2 mg by mouth as needed for diarrhea or loose stools.     [provider]  Magnesium 250 MG TABS Take 500 mg by mouth daily.     [provider]  omeprazole (PRILOSEC) 40 MG capsule Take by mouth. 08/04/20   [provider]  rosuvastatin (CRESTOR) 5 MG tablet Take 5 mg by mouth daily. 04/17/16 11/02/20  [provider]  sertraline (ZOLOFT) 100 MG tablet Take 1.5 tablets (  150 mg total) by mouth daily. Patient not taking: Reported on 11/02/2020 05/19/15 02/08/20  Elvin So, MD  Turmeric Curcumin 500 MG CAPS Take 1,500 mg by mouth daily.     [provider]  vitamin B-12 (CYANOCOBALAMIN) 500 MCG tablet Take 1,000 mcg by mouth every other day.     [provider]    Family History Family History  Problem Relation Age of Onset   Hypertension Mother    Depression Mother    Heart failure Father    Obesity Brother    Heart disease Brother    Stroke Brother    Depression Brother    Breast cancer Cousin    Breast cancer Cousin    Breast cancer Cousin     Social History Social History   Tobacco Use   Smoking status: Former    Packs/day: 1.00    Years: 10.00    Pack years: 10.00    Types: Cigarettes    Quit date: 05/18/1965    Years since quitting: 56.3   Smokeless tobacco: Never  Vaping Use   Vaping Use: Never used  Substance Use Topics   Alcohol use: Yes    Alcohol/week: 7.0 standard drinks    Types: 7 Glasses of wine per week   Drug use: No     Allergies   Sulfamethoxazole, Other, Statins, Trazodone, Ciprofloxacin, and Sulfa  antibiotics   Review of Systems Review of Systems  Musculoskeletal:  Positive for myalgias. Negative for arthralgias and joint swelling.  Skin:  Negative for color change.  Neurological:  Negative for weakness and numbness.  Hematological: Negative.   Psychiatric/Behavioral: Negative.      Physical Exam Triage Vital Signs ED Triage Vitals  Enc Vitals Group     BP 08/28/21 1523 (!) 151/75     Pulse Rate 08/28/21 1523 (!) 105     Resp 08/28/21 1523 15     Temp 08/28/21 1523 97.9 F (36.6 C)     Temp Source 08/28/21 1523 Oral     SpO2 08/28/21 1523 97 %     Weight 08/28/21 1521 200 lb (90.7 kg)     Height 08/28/21 1521 '5\' 4"'$  (1.626 m)     Head Circumference --      Peak Flow --      Pain Score 08/28/21 1521 3     Pain Loc --      Pain Edu? --      Excl. in [place]? --    No data found.  Updated Vital Signs BP (!) 151/75 (BP Location: Left Arm)   Pulse (!) 105   Temp 97.9 F (36.6 C) (Oral)   Resp 15   Ht '5\' 4"'$  (1.626 m)   Wt 200 lb (90.7 kg)   SpO2 97%   BMI 34.33 kg/m   Visual Acuity Right Eye Distance:   Left Eye Distance:   Bilateral Distance:    Right Eye Near:   Left Eye Near:    Bilateral Near:     Physical Exam Vitals and nursing note reviewed.  Constitutional:      Appearance: Normal appearance. She is not ill-appearing.  HENT:     Head: Normocephalic and atraumatic.  Musculoskeletal:        General: Tenderness present. No swelling, deformity or signs of injury. Normal range of motion.  Skin:    General: Skin is dry.     Capillary Refill: Capillary refill takes less than 2 seconds.     Findings: No bruising.  Neurological:     General: No focal deficit present.     Mental Status: She is alert and oriented to person, place, and time.  Psychiatric:        Mood and Affect: Mood normal.        Behavior: Behavior normal.        Thought Content: Thought content normal.        Judgment: Judgment normal.     UC Treatments / Results  Labs (all  labs ordered are listed, but only abnormal results are displayed) Labs Reviewed - No data to display  EKG   Radiology No results found.  Procedures Procedures (including critical care time)  Medications Ordered in UC Medications - No data to display  Initial Impression / Assessment and Plan / UC Course  I have reviewed the triage vital signs and the nursing notes.  Pertinent labs & imaging results that were available during my care of the patient were reviewed by me and considered in my medical decision making (see chart for details).  Very pleasant, nontoxic-appearing 81 year old female here for evaluation of right buttock pain as outlined in HPI above.  Patient states its in her right hip but where she indicates is her right buttock just below the iliac crest on the posterior lateral aspect.  The area is tender to touch and there is mild spasm in the muscle.  She does not have any tenderness with palpation of the greater trochanter or the hip joint itself.  She is able to ambulate and transition from sitting to standing without any difficulty.  She is also able to transition up onto the exam table for exam.  Her DP and PT pulses in her right leg are 2+.  There is no pain in her calf or lower extremity.  I suspect that she has strained her right hip and we will treat her accordingly.  She does have a history of elevated creatinine and has been advised not to take NSAIDs so I will start her on a Medrol Dosepak tomorrow morning.  I will also give her baclofen 3 times daily to help with the spasm.  I have also instructed her to apply ice to the area for torments of time 2-3 times a day.  I have also given her gentle stretches to do at home to help alleviate her pain.  Return precautions reviewed.   Final Clinical Impressions(s) / UC Diagnoses   Final diagnoses:  Pain of right hip     Discharge Instructions      You can continue to use the Tylenol for pain as needed.  Start the Medrol  dose pack tomorrow at breakfast. You will take it as directed for 6 days.  Take the Baclofen every 8 hours as needed for muscle pain and spasm.   You can apply ice to your hip for 20 minutes at a time, 2-3 times a day to help with pain as well.  Return for re-evaluation for continued or worsening symptoms.      ED Prescriptions     Medication Sig Dispense Auth. Provider   methylPREDNISolone (MEDROL DOSEPAK) 4 MG TBPK tablet Take according to the package insert. 1 each Margarette Canada, NP   baclofen (LIORESAL) 10 MG tablet Take 1 tablet (10 mg total) by mouth 3 (three) times daily. 67 each Margarette Canada, NP      PDMP not reviewed this encounter.   Margarette Canada, NP 08/28/21 [phone]

## 2021-08-28 NOTE — ED Triage Notes (Signed)
Patient states that she was putting weight on her right leg and leaning and felt a pop in her right hip around 1215 today.  Patient states that the pain has gotten worse.

## 2021-08-28 NOTE — Discharge Instructions (Signed)
You can continue to use the Tylenol for pain as needed.  Start the Medrol dose pack tomorrow at breakfast. You will take it as directed for 6 days.  Take the Baclofen every 8 hours as needed for muscle pain and spasm.   You can apply ice to your hip for 20 minutes at a time, 2-3 times a day to help with pain as well.  Return for re-evaluation for continued or worsening symptoms.

## 2021-09-08 ENCOUNTER — Ambulatory Visit
Admission: RE | Admit: 2021-09-08 | Discharge: 2021-09-08 | Disposition: A | Discharge status: Home / Self Care | Payer: Medicare HMO | Source: Ambulatory Visit | Attending: Internal Medicine | Admitting: Internal Medicine

## 2021-09-08 DIAGNOSIS — C50411 Malignant neoplasm of upper-outer quadrant of right female breast: Secondary | ICD-10-CM | POA: Diagnosis not present

## 2021-09-08 DIAGNOSIS — Z170 Estrogen receptor positive status [ER+]: Secondary | ICD-10-CM

## 2021-09-08 DIAGNOSIS — Z1231 Encounter for screening mammogram for malignant neoplasm of breast: Secondary | ICD-10-CM | POA: Diagnosis present

## 2021-10-06 ENCOUNTER — Other Ambulatory Visit: Payer: Medicare HMO

## 2021-10-26 ENCOUNTER — Other Ambulatory Visit: Payer: Medicare HMO

## 2021-10-30 ENCOUNTER — Ambulatory Visit (INDEPENDENT_AMBULATORY_CARE_PROVIDER_SITE_OTHER): Payer: Medicare HMO

## 2021-10-30 ENCOUNTER — Encounter: Payer: Self-pay | Admitting: Emergency Medicine

## 2021-10-30 ENCOUNTER — Ambulatory Visit
Admission: EM | Admit: 2021-10-30 | Discharge: 2021-10-30 | Disposition: A | Discharge status: Home / Self Care | Payer: Medicare HMO | Attending: Urgent Care | Admitting: Urgent Care

## 2021-10-30 DIAGNOSIS — M25551 Pain in right hip: Secondary | ICD-10-CM | POA: Diagnosis not present

## 2021-10-30 DIAGNOSIS — M1990 Unspecified osteoarthritis, unspecified site: Secondary | ICD-10-CM

## 2021-10-30 MED ORDER — TIZANIDINE HCL 2 MG PO TABS: 2.0000 mg | ORAL_TABLET | Freq: Three times a day (TID) | ORAL | 0 refills | Status: DC | PRN

## 2021-10-30 MED ORDER — MELOXICAM 7.5 MG PO TABS
7.5000 mg | ORAL_TABLET | Freq: Every day | ORAL | 0 refills | Status: AC
Start: 2021-10-30 — End: 2021-11-06

## 2021-10-30 NOTE — Discharge Instructions (Signed)
Your xray does not show any changes from the previous xray done in 2022. The location of your pain is likely muscular. We cannot continue prednisone as this can cause long term complications. Your most recent kidney function was near normal. I would like you to try one week of meloxicam, one tab daily. If this is effective, please follow up with PCP to discuss possibility of long term management with frequent renal lab checks. I would like to stop you baclofen, I will switch it to tizanidine. Please apply MOIST HEAT rather than ice to the affected area of your hip/ buttocks. Also discuss possible physical therapy referral, some of this pain may be radiating from your back.

## 2021-10-30 NOTE — ED Triage Notes (Signed)
Patient c/o right hip pain that started back in June.  Patient states that she was seen here at [facility] and has been on Prednisone.  Patient states that her pain has gotten worse since this morning.  Patient denies recent fall or new injury.

## 2021-10-30 NOTE — ED Provider Notes (Signed)
[facility]    CSN: [id number] Arrival date & time: 10/30/21  1039      History   Chief Complaint Chief Complaint  Patient presents with   Hip Pain    right    HPI Allison Hall is a 81 y.o. female.   Pleasant 81 year old female presents today with concerns of continued right hip pain.  She was seen back in the beginning of June of this year with the same symptoms.  She was prescribed baclofen and prednisone and states that the pain never went away.  She has actually been prescribed 4 different rounds of prednisone since, with the most recent being a 10 mg pack.  She has been on that for the past 3 days without any resolution to her pain.  She reports it primarily in the posterior aspect of her right hip.  She reports some radiation down to her lateral superior thigh around the greater trochanter, but denies greater trochanteric bursa pain.  She states walking and applying pressure to her hip causes significant pain.  Up and down stairs also causes pain.  Sitting down does relieve the pain to some degree.  She has been trying Tylenol arthritis without relief.  She states baclofen and prednisone are ineffective.  She has had no recent injury.  She has gone to the chiropractor who has worked on her back and bilateral hips that does provide some intermittent temporary relief but that is all.  She denies any saddle anesthesia.  She admits to a long standing history of arthritis and a "bad back".  Does not take NSAIDs due to kidneys, however last creatinine was 1.03.   Hip Pain    Past Medical History:  Diagnosis Date   Anemia    in past   Anxiety    Breast cancer ([place]) 08/24/2016   right breast UOQ   Cancer ([place])    skin ca   Colon polyp    COVID-19 02/14/2020   received treatment   Depression    GERD (gastroesophageal reflux disease)    Hypercholesteremia    Hypertension    Osteoarthritis    Personal history of radiation therapy 2018   RIGHT BREAST CA UOQ    Scoliosis of lumbar spine    Thyroid nodule 04/17/2016    Patient Active Problem List   Diagnosis Date Noted   Hair thinning 05/04/2020   Chronic pain of left knee 04/04/2018   Chronic pain syndrome 04/04/2018   Iron deficiency anemia 10/31/2017   B12 deficiency 10/31/2017   Arthralgia 06/03/2017   Osteoporosis 10/04/2016   Carcinoma of upper-outer quadrant of right breast in female, estrogen receptor positive ([place]) 08/24/2016   Closed fracture of distal end of ulna 06/01/2015   Triggering of digit 05/27/2014   Hamstring muscle strain 05/26/2014   Arthritis of knee, degenerative 03/26/2014   Long term current use of opiate analgesic 04/21/2013   Arthritis, degenerative 04/21/2013   Anxiety 10/05/2011   Colon polyp 10/05/2011   Esophageal foreign body 10/05/2011   Acid reflux 10/05/2011   BP (high blood pressure) 10/05/2011   Hypercholesterolemia 10/05/2011    Past Surgical History:  Procedure Laterality Date   ABDOMINAL HYSTERECTOMY     APPENDECTOMY     BREAST BIOPSY Right 08/24/2016   9:30 - invasive mammary carcinoma with lobular features   BREAST BIOPSY Right 08/24/2016   10:00 - invasive mammary carcinoma with lobular features   BREAST LUMPECTOMY Right 10/09/2016   INVASIVE CARCINOMA, TUBULO-LOBULAR VARIANT, CLEAR MARGINS,  NEGATIVE LN'S   CATARACT EXTRACTION W/PHACO Left 04/06/2020   Procedure: CATARACT EXTRACTION PHACO AND INTRAOCULAR LENS PLACEMENT ([place]) LEFT;  Surgeon: Birder Robson, MD;  Location: [place] [facility];  Service: Ophthalmology;  Laterality: Left;  5.49 0:33.9   CATARACT EXTRACTION W/PHACO Right 04/20/2020   Procedure: CATARACT EXTRACTION PHACO AND INTRAOCULAR LENS PLACEMENT ([place]) RIGHT;  Surgeon: Birder Robson, MD;  Location: [place];  Service: Ophthalmology;  Laterality: Right;  7.26 0:47.2   COLONOSCOPY W/ POLYPECTOMY  2003, 2008, 2014   COLONOSCOPY WITH PROPOFOL N/A 12/03/2017   Procedure: COLONOSCOPY WITH PROPOFOL;  Surgeon:  Manya Silvas, MD;  Location: [facility] ENDOSCOPY;  Service: Endoscopy;  Laterality: N/A;   ESOPHAGOGASTRODUODENOSCOPY     foreign body   ESOPHAGOGASTRODUODENOSCOPY (EGD) WITH PROPOFOL N/A 12/03/2017   Procedure: ESOPHAGOGASTRODUODENOSCOPY (EGD) WITH PROPOFOL;  Surgeon: Manya Silvas, MD;  Location: [facility] ENDOSCOPY;  Service: Endoscopy;  Laterality: N/A;   JOINT REPLACEMENT Right 2008   TKR   NOSE SURGERY     PARTIAL MASTECTOMY WITH NEEDLE LOCALIZATION Right 09/29/2016   Procedure: PARTIAL MASTECTOMY WITH NEEDLE LOCALIZATION;  Surgeon: [name], MD;  Location: [facility];  Service: General;  Laterality: Right;   REPLACEMENT TOTAL KNEE Right 2008   SENTINEL NODE BIOPSY Right 09/29/2016   Procedure: SENTINEL NODE BIOPSY;  Surgeon: [name], MD;  Location: [facility];  Service: General;  Laterality: Right;    OB History   No obstetric history on file.      Home Medications    Prior to Admission medications   Medication Sig Start Date End Date Taking? Authorizing Provider  meloxicam (MOBIC) 7.5 MG tablet Take 1 tablet (7.5 mg total) by mouth daily for 7 days. 10/30/21 11/06/21 Yes Gatha Mcnulty L, PA  tiZANidine (ZANAFLEX) 2 MG tablet Take 1 tablet (2 mg total) by mouth every 8 (eight) hours as needed for muscle spasms. Sedation precaution 10/30/21  Yes Romone Shaff L, PA  acetaminophen (TYLENOL) 500 MG tablet Take 650 mg by mouth every 6 (six) hours as needed.    [provider]  amLODipine (NORVASC) 10 MG tablet Take 1 tablet by mouth daily. 07/15/20   [provider]  Ascorbic Acid (VITAMIN C) 100 MG tablet Take 100 mg by mouth daily.    [provider]  buPROPion (WELLBUTRIN XL) 300 MG 24 hr tablet Take by mouth. 07/15/20   [provider]  busPIRone (BUSPAR) 10 MG tablet Take 10 mg by mouth 3 (three) times daily. 04/17/16   [provider]  busPIRone (BUSPAR) 10 MG tablet Take 1 tablet by mouth 2 (two) times daily. 07/15/20    [provider]  Calcium Carb-Cholecalciferol 600-400 MG-UNIT TABS 1200 + 1000 D once daily    [provider]  calcium carbonate 1250 MG capsule Take 1,250 mg by mouth daily.     [provider]  casirivimab-imdevimab (REGEN-COV) 600-600 MG/10ML injection REGEN-COV (EUA) 60 mg-60 mg/mL intravenous solution  Administer four (4), 2.5 ml subcutaneous injections consecutively, each at different injection sites, into the though, back of upper arm, or abdomen.    [provider]  Cholecalciferol (VITAMIN D3) 1000 units CAPS Take 2,000 Units by mouth 2 (two) times daily.     [provider]  denosumab (PROLIA) 60 MG/ML SOLN injection Inject 60 mg into the skin every 6 (six) months. Administer in upper arm, thigh, or abdomen    [provider]  exemestane (AROMASIN) 25 MG tablet Take 1 tablet (25 mg  total) by mouth daily after breakfast. 01/03/21   Cammie Sickle, MD  lisinopril (ZESTRIL) 40 MG tablet Take 40 mg by mouth daily. 11/24/18   [provider]  loperamide (IMODIUM) 2 MG capsule Take 2 mg by mouth as needed for diarrhea or loose stools.     [provider]  Magnesium 250 MG TABS Take 500 mg by mouth daily.     [provider]  omeprazole (PRILOSEC) 40 MG capsule Take by mouth. 08/04/20   [provider]  rosuvastatin (CRESTOR) 5 MG tablet Take 5 mg by mouth daily. 04/17/16 11/02/20  [provider]  Turmeric Curcumin 500 MG CAPS Take 1,500 mg by mouth daily.     [provider]  vitamin B-12 (CYANOCOBALAMIN) 500 MCG tablet Take 1,000 mcg by mouth every other day.     [provider]    Family History Family History  Problem Relation Age of Onset   Hypertension Mother    Depression Mother    Heart failure Father    Obesity Brother    Heart disease Brother    Stroke Brother    Depression Brother    Breast cancer Cousin    Breast cancer Cousin    Breast cancer Cousin      Social History Social History   Tobacco Use   Smoking status: Former    Packs/day: 1.00    Years: 10.00    Total pack years: 10.00    Types: Cigarettes    Quit date: 05/18/1965    Years since quitting: 56.4   Smokeless tobacco: Never  Vaping Use   Vaping Use: Never used  Substance Use Topics   Alcohol use: Yes    Alcohol/week: 7.0 standard drinks of alcohol    Types: 7 Glasses of wine per week   Drug use: No     Allergies   Sulfamethoxazole, Other, Statins, Trazodone, Ciprofloxacin, and Sulfa antibiotics   Review of Systems Review of Systems As per HPI  Physical Exam Triage Vital Signs ED Triage Vitals  Enc Vitals Group     BP 10/30/21 1057 (!) 141/79     Pulse Rate 10/30/21 1057 83     Resp 10/30/21 1057 14     Temp 10/30/21 1057 98.3 F (36.8 C)     Temp Source 10/30/21 1057 Oral     SpO2 10/30/21 1057 96 %     Weight 10/30/21 1055 200 lb (90.7 kg)     Height 10/30/21 1055 '5\' 4"'$  (1.626 m)     Head Circumference --      Peak Flow --      Pain Score 10/30/21 1055 7     Pain Loc --      Pain Edu? --      Excl. in [place]? --    No data found.  Updated Vital Signs BP (!) 141/79 (BP Location: Left Arm)   Pulse 83   Temp 98.3 F (36.8 C) (Oral)   Resp 14   Ht '5\' 4"'$  (1.626 m)   Wt 200 lb (90.7 kg)   SpO2 96%   BMI 34.33 kg/m   Visual Acuity Right Eye Distance:   Left Eye Distance:   Bilateral Distance:    Right Eye Near:   Left Eye Near:    Bilateral Near:     Physical Exam Vitals and nursing note reviewed.  Constitutional:      General: She is not in acute distress.    Appearance: Normal appearance. She  is obese. She is not ill-appearing, toxic-appearing or diaphoretic.  HENT:     Head: Normocephalic and atraumatic.  Cardiovascular:     Rate and Rhythm: Normal rate.     Pulses: Normal pulses.  Pulmonary:     Effort: Pulmonary effort is normal. No respiratory distress.  Musculoskeletal:        General: Tenderness (reproducible muscular  tenderness to R posterior glute/ piriformis region) present. No swelling. Normal range of motion.     Right lower leg: No edema.     Left lower leg: No edema.  Skin:    General: Skin is warm and dry.     Coloration: Skin is not jaundiced.     Findings: No erythema or rash.  Neurological:     General: No focal deficit present.     Mental Status: She is oriented to person, place, and time.     Sensory: No sensory deficit.     Motor: No weakness.     Coordination: Coordination normal.     Gait: Gait abnormal (wide based, kyphotic).     Deep Tendon Reflexes: Reflexes normal.      UC Treatments / Results  Labs (all labs ordered are listed, but only abnormal results are displayed) Labs Reviewed - No data to display  EKG   Radiology DG Hip Unilat With Pelvis 2-3 Views Right  Result Date: 10/30/2021 CLINICAL DATA:  Worsening pain since hearing a pop in the hip on 08/28/2021. EXAM: DG HIP (WITH OR WITHOUT PELVIS) 2-3V RIGHT COMPARISON:  Right hip radiographs 09/16/2020 FINDINGS: No fracture or hip dislocation is identified. At most mild hip osteoarthrosis is again seen bilaterally. Disc degeneration is noted in the included lower lumbar spine. There are numerous phleboliths in the pelvis. IMPRESSION: No acute osseous abnormality. Electronically Signed   By: Logan Bores M.D.   On: 10/30/2021 11:53    Procedures Procedures (including critical care time)  Medications Ordered in UC Medications - No data to display  Initial Impression / Assessment and Plan / UC Course  I have reviewed the triage vital signs and the nursing notes.  Pertinent labs & imaging results that were available during my care of the patient were reviewed by me and considered in my medical decision making (see chart for details).     R hip pain - xray unchanged, no evidence of AVN. Pt's pain is reproducible to palpation of the musculature. Pt may do well with PT. Baclofen ineffective, will change to tizanidine.  Warm compresses may also be beneficial. Arthritis - some of the pain may be secondary to chronic back issues. Will do one week supply of meloxicam. Pts most recent renal function was near normal. Pt to follow up with PCP for long term script if this is effective.   Final Clinical Impressions(s) / UC Diagnoses   Final diagnoses:  Right hip pain  Arthritis     Discharge Instructions      Your xray does not show any changes from the previous xray done in 2022. The location of your pain is likely muscular. We cannot continue prednisone as this can cause long term complications. Your most recent kidney function was near normal. I would like you to try one week of meloxicam, one tab daily. If this is effective, please follow up with PCP to discuss possibility of long term management with frequent renal lab checks. I would like to stop you baclofen, I will switch it to tizanidine. Please apply MOIST HEAT rather than ice  to the affected area of your hip/ buttocks. Also discuss possible physical therapy referral, some of this pain may be radiating from your back.     ED Prescriptions     Medication Sig Dispense Auth. Provider   meloxicam (MOBIC) 7.5 MG tablet Take 1 tablet (7.5 mg total) by mouth daily for 7 days. 7 tablet Jeannifer Drakeford L, PA   tiZANidine (ZANAFLEX) 2 MG tablet Take 1 tablet (2 mg total) by mouth every 8 (eight) hours as needed for muscle spasms. Sedation precaution 30 tablet Antron Seth L, PA      PDMP not reviewed this encounter.   Chaney Malling, [place] 10/31/21 1623

## 2021-11-01 ENCOUNTER — Ambulatory Visit
Admission: RE | Admit: 2021-11-01 | Discharge: 2021-11-01 | Disposition: A | Discharge status: Home / Self Care | Payer: Medicare HMO | Source: Ambulatory Visit | Attending: Internal Medicine | Admitting: Internal Medicine

## 2021-11-01 DIAGNOSIS — M810 Age-related osteoporosis without current pathological fracture: Secondary | ICD-10-CM | POA: Diagnosis present

## 2021-11-03 ENCOUNTER — Other Ambulatory Visit: Payer: Self-pay | Admitting: Family Medicine

## 2021-11-03 DIAGNOSIS — G8929 Other chronic pain: Secondary | ICD-10-CM

## 2021-11-11 ENCOUNTER — Inpatient Hospital Stay: Payer: Medicare HMO

## 2021-11-11 ENCOUNTER — Inpatient Hospital Stay: Payer: Medicare HMO | Admitting: Internal Medicine

## 2021-11-16 ENCOUNTER — Ambulatory Visit
Admission: RE | Admit: 2021-11-16 | Discharge: 2021-11-16 | Disposition: A | Discharge status: Home / Self Care | Payer: Medicare HMO | Source: Ambulatory Visit | Attending: Family Medicine | Admitting: Family Medicine

## 2021-11-16 DIAGNOSIS — M25551 Pain in right hip: Secondary | ICD-10-CM | POA: Insufficient documentation

## 2021-11-16 DIAGNOSIS — G8929 Other chronic pain: Secondary | ICD-10-CM | POA: Insufficient documentation

## 2021-11-29 ENCOUNTER — Encounter: Payer: Self-pay | Admitting: Emergency Medicine

## 2021-11-29 ENCOUNTER — Ambulatory Visit
Admission: EM | Admit: 2021-11-29 | Discharge: 2021-11-29 | Disposition: A | Discharge status: Home / Self Care | Payer: Medicare HMO

## 2021-11-29 DIAGNOSIS — I878 Other specified disorders of veins: Secondary | ICD-10-CM | POA: Diagnosis not present

## 2021-11-29 DIAGNOSIS — R0602 Shortness of breath: Secondary | ICD-10-CM | POA: Diagnosis not present

## 2021-11-29 DIAGNOSIS — F419 Anxiety disorder, unspecified: Secondary | ICD-10-CM | POA: Diagnosis not present

## 2021-11-29 NOTE — ED Provider Notes (Addendum)
[facility]    CSN: [id number] Arrival date & time: 11/29/21  1653      History   Chief Complaint Chief Complaint  Patient presents with   Shortness of Breath    HPI Allison Hall is a 81 y.o. female.  With a history of anxiety, depression, chronic lower extremity swelling presents to the urgent care for evaluation of a brief episode of shortness of breath that occurred earlier today.  Patient states she was walking around 4 PM outside, was taking a recycling bin to the curb when she felt like she could not breathe and short of breath.  She denies any chest pain, nausea, diaphoresis.  Symptoms quickly resolved.  She denies any weakness, dizziness or lightheadedness.  She is now without any chest pain or shortness of breath.  Her episode only lasted for few minutes.  She states she suffers from anxiety and has been having trouble getting her anxiety under control.  She is currently on BuSpar and recently started gabapentin.  She was unable to tolerate Vistaril.  She states she is got a lot going on, she is struggling with right hip pain and left knee pain, has been seeing a Restaurant manager, fast food.  Her orthopedic ailments are preventing her from getting out performing her normal activities of daily living and socializing.  She has been sleeping in a chair and not elevating her legs that she said.  States she has been prescribed compression stockings but has not been wearing them.  She currently denies any chest pain, shortness of breath or any increase in swelling in her lower legs.  Patient states she has been worked up by cardiology with no issues.  HPI  Past Medical History:  Diagnosis Date   Anemia    in past   Anxiety    Breast cancer ([place]) 08/24/2016   right breast UOQ   Cancer ([place])    skin ca   Colon polyp    COVID-19 02/14/2020   received treatment   Depression    GERD (gastroesophageal reflux disease)    Hypercholesteremia    Hypertension    Osteoarthritis     Personal history of radiation therapy 2018   RIGHT BREAST CA UOQ   Scoliosis of lumbar spine    Thyroid nodule 04/17/2016    Patient Active Problem List   Diagnosis Date Noted   Hair thinning 05/04/2020   Chronic pain of left knee 04/04/2018   Chronic pain syndrome 04/04/2018   Iron deficiency anemia 10/31/2017   B12 deficiency 10/31/2017   Arthralgia 06/03/2017   Osteoporosis 10/04/2016   Carcinoma of upper-outer quadrant of right breast in female, estrogen receptor positive ([place]) 08/24/2016   Closed fracture of distal end of ulna 06/01/2015   Triggering of digit 05/27/2014   Hamstring muscle strain 05/26/2014   Arthritis of knee, degenerative 03/26/2014   Long term current use of opiate analgesic 04/21/2013   Arthritis, degenerative 04/21/2013   Anxiety 10/05/2011   Colon polyp 10/05/2011   Esophageal foreign body 10/05/2011   Acid reflux 10/05/2011   BP (high blood pressure) 10/05/2011   Hypercholesterolemia 10/05/2011    Past Surgical History:  Procedure Laterality Date   ABDOMINAL HYSTERECTOMY     APPENDECTOMY     BREAST BIOPSY Right 08/24/2016   9:30 - invasive mammary carcinoma with lobular features   BREAST BIOPSY Right 08/24/2016   10:00 - invasive mammary carcinoma with lobular features   BREAST LUMPECTOMY Right 10/09/2016   INVASIVE CARCINOMA, TUBULO-LOBULAR VARIANT, CLEAR  MARGINS, NEGATIVE LN'S   CATARACT EXTRACTION W/PHACO Left 04/06/2020   Procedure: CATARACT EXTRACTION PHACO AND INTRAOCULAR LENS PLACEMENT ([place]) LEFT;  Surgeon: Birder Robson, MD;  Location: [place];  Service: Ophthalmology;  Laterality: Left;  5.49 0:33.9   CATARACT EXTRACTION W/PHACO Right 04/20/2020   Procedure: CATARACT EXTRACTION PHACO AND INTRAOCULAR LENS PLACEMENT ([place]) RIGHT;  Surgeon: Birder Robson, MD;  Location: [place];  Service: Ophthalmology;  Laterality: Right;  7.26 0:47.2   COLONOSCOPY W/ POLYPECTOMY  2003, 2008, 2014   COLONOSCOPY WITH  PROPOFOL N/A 12/03/2017   Procedure: COLONOSCOPY WITH PROPOFOL;  Surgeon: Manya Silvas, MD;  Location: [facility] ENDOSCOPY;  Service: Endoscopy;  Laterality: N/A;   ESOPHAGOGASTRODUODENOSCOPY     foreign body   ESOPHAGOGASTRODUODENOSCOPY (EGD) WITH PROPOFOL N/A 12/03/2017   Procedure: ESOPHAGOGASTRODUODENOSCOPY (EGD) WITH PROPOFOL;  Surgeon: Manya Silvas, MD;  Location: [facility]COPY;  Service: Endoscopy;  Laterality: N/A;   JOINT REPLACEMENT Right 2008   TKR   NOSE SURGERY     PARTIAL MASTECTOMY WITH NEEDLE LOCALIZATION Right 09/29/2016   Procedure: PARTIAL MASTECTOMY WITH NEEDLE LOCALIZATION;  Surgeon: [name], MD;  Location: [facility];  Service: General;  Laterality: Right;   REPLACEMENT TOTAL KNEE Right 2008   SENTINEL NODE BIOPSY Right 09/29/2016   Procedure: SENTINEL NODE BIOPSY;  Surgeon: [name], MD;  Location: [facility];  Service: General;  Laterality: Right;    OB History   No obstetric history on file.      Home Medications    Prior to Admission medications   Medication Sig Start Date End Date Taking? Authorizing Provider  acetaminophen (TYLENOL) 500 MG tablet Take 650 mg by mouth every 6 (six) hours as needed.   Yes [provider]  buPROPion (WELLBUTRIN XL) 300 MG 24 hr tablet Take by mouth. 07/15/20  Yes [provider]  busPIRone (BUSPAR) 10 MG tablet Take 10 mg by mouth 3 (three) times daily. 04/17/16  Yes [provider]  busPIRone (BUSPAR) 10 MG tablet Take 1 tablet by mouth 2 (two) times daily. 07/15/20  Yes [provider]  meloxicam (MOBIC) 7.5 MG tablet Take by mouth. 11/16/21  Yes [provider]  omeprazole (PRILOSEC) 40 MG capsule Take by mouth. 08/04/20  Yes [provider]  amLODipine (NORVASC) 10 MG tablet Take 1 tablet by mouth daily. 07/15/20   [provider]  Ascorbic Acid (VITAMIN C) 100 MG tablet Take 100 mg by mouth daily.    [provider]  Calcium  Carb-Cholecalciferol 600-400 MG-UNIT TABS 1200 + 1000 D once daily    [provider]  calcium carbonate 1250 MG capsule Take 1,250 mg by mouth daily.     [provider]  casirivimab-imdevimab (REGEN-COV) 600-600 MG/10ML injection REGEN-COV (EUA) 60 mg-60 mg/mL intravenous solution  Administer four (4), 2.5 ml subcutaneous injections consecutively, each at different injection sites, into the though, back of upper arm, or abdomen.    [provider]  Cholecalciferol (VITAMIN D3) 1000 units CAPS Take 2,000 Units by mouth 2 (two) times daily.     [provider]  denosumab (PROLIA) 60 MG/ML SOLN injection Inject 60 mg into the skin every 6 (six) months. Administer in upper arm, thigh, or abdomen    [provider]  exemestane (AROMASIN) 25 MG tablet Take 1 tablet (25 mg total) by mouth daily after breakfast. 01/03/21   Cammie Sickle, MD  gabapentin (NEURONTIN) 100 MG capsule Take by mouth. 11/24/21   [provider]  lisinopril (ZESTRIL) 40 MG tablet Take 40 mg by mouth daily. 11/24/18   [provider]  loperamide (IMODIUM) 2 MG capsule Take 2 mg by mouth as needed for diarrhea or loose stools.     [provider]  Magnesium 250 MG TABS Take 500 mg by mouth daily.     [provider]  rosuvastatin (CRESTOR) 5 MG tablet Take 5 mg by mouth daily. 04/17/16 11/02/20  [provider]  tiZANidine (ZANAFLEX) 2 MG tablet Take 1 tablet (2 mg total) by mouth every 8 (eight) hours as needed for muscle spasms. Sedation precaution 10/30/21   Geryl Councilman L, PA  Turmeric Curcumin 500 MG CAPS Take 1,500 mg by mouth daily.     [provider]  vitamin B-12 (CYANOCOBALAMIN) 500 MCG tablet Take 1,000 mcg by mouth every other day.     [provider]    Family History Family History  Problem Relation Age of Onset   Hypertension Mother    Depression Mother    Heart failure Father    Obesity Brother     Heart disease Brother    Stroke Brother    Depression Brother    Breast cancer Cousin    Breast cancer Cousin    Breast cancer Cousin     Social History Social History   Tobacco Use   Smoking status: Former    Packs/day: 1.00    Years: 10.00    Total pack years: 10.00    Types: Cigarettes    Quit date: 05/18/1965    Years since quitting: 56.5   Smokeless tobacco: Never  Vaping Use   Vaping Use: Never used  Substance Use Topics   Alcohol use: Yes    Alcohol/week: 7.0 standard drinks of alcohol    Types: 7 Glasses of wine per week   Drug use: No     Allergies   Sulfamethoxazole, Other, Statins, Trazodone, Ciprofloxacin, and Sulfa antibiotics   Review of Systems Review of Systems   Physical Exam Triage Vital Signs ED Triage Vitals  Enc Vitals Group     BP 11/29/21 1813 133/67     Pulse Rate 11/29/21 1813 87     Resp 11/29/21 1813 16     Temp 11/29/21 1813 98.2 F (36.8 C)     Temp Source 11/29/21 1813 Oral     SpO2 11/29/21 1813 99 %     Weight --      Height --      Head Circumference --      Peak Flow --      Pain Score 11/29/21 1807 0     Pain Loc --      Pain Edu? --      Excl. in [place]? --    No data found.  Updated Vital Signs BP 133/67 (BP Location: Left Arm)   Pulse 87   Temp 98.2 F (36.8 C) (Oral)   Resp 16   SpO2 99%   Visual Acuity Right Eye Distance:   Left Eye Distance:   Bilateral Distance:    Right Eye Near:   Left Eye Near:    Bilateral Near:     Physical Exam Constitutional:      Appearance: She is well-developed.  HENT:     Head: Normocephalic and atraumatic.     Right Ear: External ear normal.     Left Ear: External ear normal.     Nose: No congestion or rhinorrhea.  Mouth/Throat:     Mouth: Mucous membranes are moist.     Pharynx: No oropharyngeal exudate or posterior oropharyngeal erythema.  Eyes:     Conjunctiva/sclera: Conjunctivae normal.  Cardiovascular:     Rate and Rhythm: Normal rate and regular  rhythm.     Pulses: Normal pulses.     Heart sounds: Normal heart sounds.  Pulmonary:     Effort: Pulmonary effort is normal. No respiratory distress.     Breath sounds: Normal breath sounds. No stridor. No wheezing, rhonchi or rales.  Chest:     Chest wall: No tenderness.  Musculoskeletal:        General: Normal range of motion.     Cervical back: Normal range of motion.     Comments: Swelling in both lower extremities, no warmth or redness.  Negative Homans' sign bilaterally.  Skin:    General: Skin is warm.     Findings: No rash.  Neurological:     General: No focal deficit present.     Mental Status: She is alert and oriented to person, place, and time.  Psychiatric:        Behavior: Behavior normal.        Thought Content: Thought content normal.      UC Treatments / Results  Labs (all labs ordered are listed, but only abnormal results are displayed) Labs Reviewed - No data to display  EKG   Radiology No results found.  Procedures Procedures (including critical care time)  Medications Ordered in UC Medications - No data to display  Initial Impression / Assessment and Plan / UC Course  I have reviewed the triage vital signs and the nursing notes.  Pertinent labs & imaging results that were available during my care of the patient were reviewed by me and considered in my medical decision making (see chart for details).     81 year old female with 1 brief episode of shortness of breath earlier today.  Symptoms only lasted a few minutes.  She is without any chest pain, shortness of breath, nausea or vomiting.  She currently feels well.  Patient states her episode of shortness of breath only lasted a few minutes that she was pushing her trash cart outside.  She currently appears well, no distress.  Her vital signs are normal with no tachycardia and no abnormal heart rhythm.  She does suffer from chronic lower extremity swelling and has been unable to keep her legs  elevated due to no longer sleeping in a bed due to her right hip orthopedic issues.  She recently saw chiropractic days she states her hip is feeling better so she may be able to sleep in the bed.  Recommend she wear compression stockings and I have placed a referral to vascular.  Patient will continue with her BuSpar and gabapentin.  She recently started gabapentin and states it did help last night with her ability to sleep and feeling of anxiousness.  She understands signs symptoms return to the ER for. Final Clinical Impressions(s) / UC Diagnoses   Final diagnoses:  Anxiety  Shortness of breath  Venous stasis of both lower extremities     Discharge Instructions      Please continue with current medications.  Follow-up with vascular physician to address venous insufficiency in both lower extremities.  If you can, please work on elevating your lower legs above the heart and wearing compression stockings this will help with swelling in the lower extremities.  Return to the urgent care for  any shortness of breath, chest pain, worsening symptoms or to changes in her health   ED Prescriptions   None    PDMP not reviewed this encounter.   Duanne Guess, PA-C 11/29/21 1904    Duanne Guess, PA-C 11/29/21 1904

## 2021-11-29 NOTE — Discharge Instructions (Addendum)
Please continue with current medications.  Follow-up with vascular physician to address venous insufficiency in both lower extremities.  If you can, please work on elevating your lower legs above the heart and wearing compression stockings this will help with swelling in the lower extremities.  Return to the urgent care for any shortness of breath, chest pain, worsening symptoms or to changes in her health

## 2021-11-29 NOTE — ED Triage Notes (Signed)
Pt states around 4 pm today she went to take her recycling bin to the curb and check her mailbox across the street from her home. As she was walking back she became SOB. Pt denies any breathing problems in the past and she denies any cold symptoms. At this moment she denies SOB.

## 2021-12-05 ENCOUNTER — Encounter: Payer: Self-pay | Admitting: Internal Medicine

## 2021-12-05 ENCOUNTER — Inpatient Hospital Stay: Payer: Medicare HMO | Attending: Internal Medicine

## 2021-12-05 ENCOUNTER — Inpatient Hospital Stay: Payer: Medicare HMO | Admitting: Internal Medicine

## 2021-12-05 ENCOUNTER — Inpatient Hospital Stay: Payer: Medicare HMO

## 2021-12-05 DIAGNOSIS — C50412 Malignant neoplasm of upper-outer quadrant of left female breast: Secondary | ICD-10-CM | POA: Diagnosis not present

## 2021-12-05 DIAGNOSIS — M85851 Other specified disorders of bone density and structure, right thigh: Secondary | ICD-10-CM | POA: Insufficient documentation

## 2021-12-05 DIAGNOSIS — M810 Age-related osteoporosis without current pathological fracture: Secondary | ICD-10-CM

## 2021-12-05 DIAGNOSIS — Z79811 Long term (current) use of aromatase inhibitors: Secondary | ICD-10-CM | POA: Diagnosis not present

## 2021-12-05 DIAGNOSIS — C50411 Malignant neoplasm of upper-outer quadrant of right female breast: Secondary | ICD-10-CM | POA: Diagnosis not present

## 2021-12-05 DIAGNOSIS — Z170 Estrogen receptor positive status [ER+]: Secondary | ICD-10-CM | POA: Diagnosis not present

## 2021-12-05 LAB — COMPREHENSIVE METABOLIC PANEL
ALT: 24 U/L (ref 0–44)
AST: 32 U/L (ref 15–41)
Albumin: 4.6 g/dL (ref 3.5–5.0)
Alkaline Phosphatase: 41 U/L (ref 38–126)
Anion gap: 9 (ref 5–15)
BUN: 19 mg/dL (ref 8–23)
CO2: 27 mmol/L (ref 22–32)
Calcium: 10 mg/dL (ref 8.9–10.3)
Chloride: 105 mmol/L (ref 98–111)
Creatinine, Ser: 1.06 mg/dL — ABNORMAL HIGH (ref 0.44–1.00)
GFR, Estimated: 52 mL/min — ABNORMAL LOW (ref >60)
Glucose, Bld: 118 mg/dL — ABNORMAL HIGH (ref 70–99)
Potassium: 3.8 mmol/L (ref 3.5–5.1)
Sodium: 141 mmol/L (ref 135–145)
Total Bilirubin: 0.6 mg/dL (ref 0.3–1.2)
Total Protein: 6.9 g/dL (ref 6.5–8.1)

## 2021-12-05 LAB — CBC WITH DIFFERENTIAL/PLATELET
Abs Immature Granulocytes: 0.05 10*3/uL (ref 0.00–0.07)
Basophils Absolute: 0 10*3/uL (ref 0.0–0.1)
Basophils Relative: 1 %
Eosinophils Absolute: 0.1 10*3/uL (ref 0.0–0.5)
Eosinophils Relative: 2 %
HCT: 34.2 % — ABNORMAL LOW (ref 36.0–46.0)
Hemoglobin: 11.3 g/dL — ABNORMAL LOW (ref 12.0–15.0)
Immature Granulocytes: 1 %
Lymphocytes Relative: 21 %
Lymphs Abs: 1.4 10*3/uL (ref 0.7–4.0)
MCH: 32.3 pg (ref 26.0–34.0)
MCHC: 33 g/dL (ref 30.0–36.0)
MCV: 97.7 fL (ref 80.0–100.0)
Monocytes Absolute: 0.8 10*3/uL (ref 0.1–1.0)
Monocytes Relative: 12 %
Neutro Abs: 4.4 10*3/uL (ref 1.7–7.7)
Neutrophils Relative %: 63 %
Platelets: 249 10*3/uL (ref 150–400)
RBC: 3.5 MIL/uL — ABNORMAL LOW (ref 3.87–5.11)
RDW: 14.5 % (ref 11.5–15.5)
WBC: 6.8 10*3/uL (ref 4.0–10.5)
nRBC: 0 % (ref 0.0–0.2)

## 2021-12-05 MED ORDER — DENOSUMAB 60 MG/ML ~~LOC~~ SOSY
60.0000 mg | PREFILLED_SYRINGE | Freq: Once | SUBCUTANEOUS | Status: AC
  Administered 2021-12-05: 60 mg via SUBCUTANEOUS
  Filled 2021-12-05: qty 1

## 2021-12-05 NOTE — Assessment & Plan Note (Addendum)
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   Continue Aromasin-tolerating well- ?  MSK pain- see below  #  Clinically no evidence of recurrence.  Mammogram June 2023 normal.  I again reviewed the benefit of extended AI.  See below regarding musculoskeletal pain  # MSK/Hx of OA-[s/p knee steroids-]-grade 1-2; chronic joint pains/arthritis-even before patient started Aromasin.  Monitor for now.  #  Osteoporosis:The BMD [July 2021] T-score of -2.8. Prolia today; BMD-T-score= -.2.8. continue with prolia.   # Anemia- mild Hb 11.3; recommend PO Iron gentle iron-poor tolerance/dairrhea. 2019- last colo-; recommend making appointment with GI.  Discussed regarding adding these foods.  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  #Bilateral lower extremity swelling-clinically not suspicious of DVT/PE.  Suspect dependent edema/venous stasis changes; recommend elevation.   *mychart # DISPOSITION: # Prolia today  # Follow up in  6 months for MD;  labs [facility] with diff, CMP, CA27.29], and Prolia; Dr.B

## 2021-12-05 NOTE — Progress Notes (Signed)
[place] NOTE  Patient Care Team: Luther Hearing, MD as PCP - General (Family Medicine) [name], MD (Inactive) as Referring Physician (Surgery)  CHIEF COMPLAINTS/PURPOSE OF CONSULTATION: Breast cancer  #  Oncology History Overview Note  Allison Hall, [place] [address]      Assessment:  Allison Hall is a 81 y.o. female with multi-focal right breast cancer s/p partial mastectomy with sentinel lymph node biopsy on 09/29/2016.  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  Three sentinel lymph nodes were negative.  Tumor was ER + (> 90%), PR + (> 90%) and Her2/neu 2+ (negative by FISH).  Pathologic stage was T1c(m) N0(sn).  CA27.29 was 13.4 on 09/06/2016.   MammaPrint revealed a 29% risk of recurrence (high).  She was seen for second opinion at [facility] on 10/25/2016.  Chemotherapy was not recommended.   Bilateral screening mammogram on 07/24/2016 revealed a possible area of distortion in the right breast. Diagnostic mammogram on 08/10/2016 revealed 2 suspicious masses in the upper-outer quadrant of the right breast in a broad area of confluent distortion in the upper-outer quadrant of the right breast.  There are at least 3 areas of confluent distortion spanning approximately 6.5 cm of tissue.  Ultrasound on 07/31/2016 revealed  an irregular hypoechoic 1.2 x 1.1 x 1.2 cm at the 9:30 position 7 cm from the nipple.  In addition, there was an irregular 1.9 x 0.8 x 1.2 cm mass at the 10 o'clock position 5 cm from the nipple.  There were multiple small normal-appearing lymph nodes.   Breast MRI on 09/16/2016 revealed an irregular 1.6 x 1.2 x 1.6 cm enhancing mass adjacent to the clip artifact within the upper, outer right breast, middle depth, corresponding to the mass seen at 10 o'clock. There was an additional irregular enhancing mass adjacent to the more  posterior clip artifact measuring 2.6 x 1.4 x 1.6 cm, corresponding  to the mass seen at 9:30 position. Together, these 2 masses span 6.3 cm of breast tissue from anterior to posterior. There was no definite additional suspicious area of enhancement within the right breast to correspond to the third area of distortion identified on diagnostic mammogram dated 08/10/2016.  Left breast revealed no suspicious mass or enhancement.   Bilateral diagnostic mammogram on 09/03/2019 revealed no evidence of malignancy.   She received radiation from 11/15/2016 - 01/08/2017.  She was Femara (01/17/2017 - 05/16/2017) secondary to arthralgias.  She discontinued Arimidex after 19 days (last 06/14/2017) secondary to fatigue and polyarthralgias.  She began Aromasin on 06/27/2017.   CA27.29 has been followed:  13.4 on 09/06/2016, 13.0 on 05/16/2017, 17.1 on 08/29/2017, 12.1 on 06/04/2018, 13.8 on 10/01/2018, and 15.6 on 02/04/2019.   Bone density study on 09/30/2015 revealed osteopenia with a T-score of -2.1 in the right femoral neck.  Bone density study revealed osteoporosis on 10/02/2017 with a T-score of -3.1. She began Prolia on 02/14/2017 (last 10/09/2019).   She was diagnosed with iron deficiency anemia and B12 deficiency on 08/29/2017.  TSH and folate were normal.  She is on oral B12 500 mcg a day.  Intrinsic factor and anti-parietal cell antibodies were normal on 01/02/2018.  B12 was 888 on 01/29/2018 and 544 on 02/04/2019.   She received Venofer weekly x 3 (10/31/2017 - 11/13/2017) and x 4 (06/05/2018 - 06/26/2018).   Ferritin has been followed: 10 on 08/29/2017, 174 on 12/12/2017, 74 on 01/02/2018,  64 on 01/29/2018, 3 on 06/04/2018, 205 on 10/01/2018, and 162 on 02/04/2019.   EGD and colonoscopy on 12/03/2017. Polyps were noted. Pathology was negative for dysplasia and malignancy.    She tested positive for COVID-19 on 02/14/2020.   Symptomatically, she is doing well.  She denies any breast concerns.  She  has some hair thinning.  Exam reveals no evidence of recurrent disease.   Pl   Carcinoma of upper-outer quadrant of right breast in female, estrogen receptor positive ([place])  08/24/2016 Initial Diagnosis   Carcinoma of upper-outer quadrant of breast in female, estrogen receptor positive ([place])     HISTORY OF PRESENTING ILLNESS: Alone.  Ambulating independently.  Allison Hall 81 y.o.  female patient with stage I multifocal ER/PR positive HER2 negative breast cancer currently on adjuvant Aromasin [on Extended; Mammaprint-high risk]/review results of the bone density.  More recently patient noted to have worsening right hip pain.  MRI showed tendon tear of Right hip that is going to be treated with new medication by orthopaedic, Dr.Poggi.   Chronic joint pains not any worse.Otherwise denies any nausea vomiting headaches.  Chronic mild intermittent swelling in the legs.  Review of Systems  Constitutional:  Positive for malaise/fatigue. Negative for chills, diaphoresis, fever and weight loss.  HENT:  Negative for nosebleeds and sore throat.   Eyes:  Negative for double vision.  Respiratory:  Negative for cough, hemoptysis, sputum production, shortness of breath and wheezing.   Cardiovascular:  Negative for chest pain, palpitations, orthopnea and leg swelling.  Gastrointestinal:  Negative for abdominal pain, blood in stool, constipation, diarrhea, heartburn, melena, nausea and vomiting.  Genitourinary:  Negative for dysuria, frequency and urgency.  Musculoskeletal:  Positive for back pain and joint pain.  Skin: Negative.  Negative for itching and rash.  Neurological:  Negative for dizziness, tingling, focal weakness, weakness and headaches.  Endo/Heme/Allergies:  Does not bruise/bleed easily.       Hair loss.  Psychiatric/Behavioral:  Negative for depression. The patient is not nervous/anxious and does not have insomnia.      MEDICAL HISTORY:  Past Medical History:  Diagnosis Date    Anemia    in past   Anxiety    Breast cancer ([place]) 08/24/2016   right breast UOQ   Cancer ([place])    skin ca   Colon polyp    COVID-19 02/14/2020   received treatment   Depression    GERD (gastroesophageal reflux disease)    Hypercholesteremia    Hypertension    Osteoarthritis    Personal history of radiation therapy 2018   RIGHT BREAST CA UOQ   Scoliosis of lumbar spine    Thyroid nodule 04/17/2016    SURGICAL HISTORY: Past Surgical History:  Procedure Laterality Date   ABDOMINAL HYSTERECTOMY     APPENDECTOMY     BREAST BIOPSY Right 08/24/2016   9:30 - invasive mammary carcinoma with lobular features   BREAST BIOPSY Right 08/24/2016   10:00 - invasive mammary carcinoma with lobular features   BREAST LUMPECTOMY Right 10/09/2016   INVASIVE CARCINOMA, TUBULO-LOBULAR VARIANT, CLEAR MARGINS, NEGATIVE LN'S   CATARACT EXTRACTION W/PHACO Left 04/06/2020   Procedure: CATARACT EXTRACTION PHACO AND INTRAOCULAR LENS PLACEMENT ([place]) LEFT;  Surgeon: Birder Robson, MD;  Location: [place];  Service: Ophthalmology;  Laterality: Left;  5.49 0:33.9   CATARACT EXTRACTION W/PHACO Right 04/20/2020   Procedure: CATARACT EXTRACTION PHACO AND INTRAOCULAR LENS PLACEMENT ([place]) RIGHT;  Surgeon: Birder Robson, MD;  Location: [place];  Service: Ophthalmology;  Laterality: Right;  7.26 0:47.2   COLONOSCOPY W/ POLYPECTOMY  2003, 2008, 2014   COLONOSCOPY WITH PROPOFOL N/A 12/03/2017   Procedure: COLONOSCOPY WITH PROPOFOL;  Surgeon: Manya Silvas, MD;  Location: [facility] ENDOSCOPY;  Service: Endoscopy;  Laterality: N/A;   ESOPHAGOGASTRODUODENOSCOPY     foreign body   ESOPHAGOGASTRODUODENOSCOPY (EGD) WITH PROPOFOL N/A 12/03/2017   Procedure: ESOPHAGOGASTRODUODENOSCOPY (EGD) WITH PROPOFOL;  Surgeon: Manya Silvas, MD;  Location: [facility] ENDOSCOPY;  Service: Endoscopy;  Laterality: N/A;   JOINT REPLACEMENT Right 2008   TKR   NOSE SURGERY     PARTIAL MASTECTOMY WITH NEEDLE  LOCALIZATION Right 09/29/2016   Procedure: PARTIAL MASTECTOMY WITH NEEDLE LOCALIZATION;  Surgeon: [name], MD;  Location: [facility] ORS;  Service: General;  Laterality: Right;   REPLACEMENT TOTAL KNEE Right 2008   SENTINEL NODE BIOPSY Right 09/29/2016   Procedure: SENTINEL NODE BIOPSY;  Surgeon: [name], MD;  Location: [facility] ORS;  Service: General;  Laterality: Right;    SOCIAL HISTORY: Social History   Socioeconomic History   Marital status: Widowed    Spouse name: Not on file   Number of children: Not on file   Years of education: Not on file   Highest education level: Not on file  Occupational History   Not on file  Tobacco Use   Smoking status: Former    Packs/day: 1.00    Years: 10.00    Total pack years: 10.00    Types: Cigarettes    Quit date: 05/18/1965    Years since quitting: 56.5   Smokeless tobacco: Never  Vaping Use   Vaping Use: Never used  Substance and Sexual Activity   Alcohol use: Yes    Alcohol/week: 7.0 standard drinks of alcohol    Types: 7 Glasses of wine per week   Drug use: No   Sexual activity: Not Currently  Other Topics Concern   Not on file  Social History Narrative   Not on file   Social Determinants of Health   Financial Resource Strain: Low Risk  (12/03/2017)   Overall Financial Resource Strain (CARDIA)    Difficulty of Paying Living Expenses: Not hard at all  Food Insecurity: No Food Insecurity (12/03/2017)   Hunger Vital Sign    Worried About Running Out of Food in the Last Year: Never true    Ran Out of Food in the Last Year: Never true  Transportation Needs: No Transportation Needs (12/03/2017)   PRAPARE - Hydrologist (Medical): No    Lack of Transportation (Non-Medical): No  Physical Activity: Inactive (12/03/2017)   Exercise Vital Sign    Days of Exercise per Week: 0 days    Minutes of Exercise per Session: 0 min  Stress: No Stress Concern Present (12/03/2017)   [place]    Feeling of Stress : Only a little  Social Connections: Unknown (12/03/2017)   Social Connection and Isolation Panel [NHANES]    Frequency of Communication with Friends and Family: Patient refused    Frequency of Social Gatherings with Friends and Family: Patient refused    Attends Religious Services: Patient refused    Active Member of Clubs or Organizations: Patient refused    Attends Archivist Meetings: Patient refused    Marital Status: Patient refused  Intimate Partner Violence: Unknown (12/03/2017)   Humiliation, Afraid, Rape, and Kick questionnaire    Fear of Current or Ex-Partner: Patient refused  Emotionally Abused: Patient refused    Physically Abused: Patient refused    Sexually Abused: Patient refused    FAMILY HISTORY: Family History  Problem Relation Age of Onset   Hypertension Mother    Depression Mother    Heart failure Father    Obesity Brother    Heart disease Brother    Stroke Brother    Depression Brother    Breast cancer Cousin    Breast cancer Cousin    Breast cancer Cousin     ALLERGIES:  is allergic to sulfamethoxazole, other, statins, trazodone, ciprofloxacin, and sulfa antibiotics.  MEDICATIONS:  Current Outpatient Medications  Medication Sig Dispense Refill   acetaminophen (TYLENOL) 500 MG tablet Take 650 mg by mouth every 6 (six) hours as needed.     amLODipine (NORVASC) 10 MG tablet Take 1 tablet by mouth daily.     Ascorbic Acid (VITAMIN C) 100 MG tablet Take 100 mg by mouth daily.     busPIRone (BUSPAR) 10 MG tablet Take 10 mg by mouth 3 (three) times daily.     Calcium Carb-Cholecalciferol 600-400 MG-UNIT TABS 1200 + 1000 D once daily     calcium carbonate 1250 MG capsule Take 1,250 mg by mouth daily.      Cholecalciferol (VITAMIN D3) 1000 units CAPS Take 2,000 Units by mouth 2 (two) times daily.      denosumab (PROLIA) 60 MG/ML SOLN injection Inject 60 mg into the skin  every 6 (six) months. Administer in upper arm, thigh, or abdomen     exemestane (AROMASIN) 25 MG tablet Take 1 tablet (25 mg total) by mouth daily after breakfast. 90 tablet 3   lisinopril (ZESTRIL) 40 MG tablet Take 40 mg by mouth daily.     loperamide (IMODIUM) 2 MG capsule Take 2 mg by mouth as needed for diarrhea or loose stools.      Magnesium 250 MG TABS Take 500 mg by mouth daily.      meloxicam (MOBIC) 7.5 MG tablet Take by mouth.     omeprazole (PRILOSEC) 40 MG capsule Take by mouth.     rosuvastatin (CRESTOR) 5 MG tablet Take 5 mg by mouth daily.     Turmeric Curcumin 500 MG CAPS Take 1,500 mg by mouth daily.      vitamin B-12 (CYANOCOBALAMIN) 500 MCG tablet Take 1,000 mcg by mouth every other day.      baclofen (LIORESAL) 10 MG tablet Take 10 mg by mouth 3 (three) times daily.     buPROPion (WELLBUTRIN XL) 300 MG 24 hr tablet Take by mouth.     busPIRone (BUSPAR) 10 MG tablet Take 1 tablet by mouth 2 (two) times daily. (Patient not taking: Reported on 12/05/2021)     casirivimab-imdevimab (REGEN-COV) 600-600 MG/10ML injection REGEN-COV (EUA) 60 mg-60 mg/mL intravenous solution  Administer four (4), 2.5 ml subcutaneous injections consecutively, each at different injection sites, into the though, back of upper arm, or abdomen.     gabapentin (NEURONTIN) 100 MG capsule Take by mouth.     tiZANidine (ZANAFLEX) 2 MG tablet Take 1 tablet (2 mg total) by mouth every 8 (eight) hours as needed for muscle spasms. Sedation precaution 30 tablet 0   No current facility-administered medications for this visit.   Facility-Administered Medications Ordered in Other Visits  Medication Dose Route Frequency Provider Last Rate Last Admin   denosumab (PROLIA) injection 60 mg  60 mg Subcutaneous Once Cammie Sickle, MD          .  PHYSICAL EXAMINATION: ECOG PERFORMANCE STATUS: 0 - Asymptomatic  Vitals:   12/05/21 1500  BP: (!) 154/84  Pulse: 100  Resp: 18  Temp: 97.9 F (36.6 C)    Filed Weights   12/05/21 1500  Weight: 214 lb (97.1 kg)    Physical Exam Vitals and nursing note reviewed.  Constitutional:      Comments: Alone.      HENT:     Head: Normocephalic and atraumatic.     Mouth/Throat:     Pharynx: Oropharynx is clear.  Eyes:     Extraocular Movements: Extraocular movements intact.     Pupils: Pupils are equal, round, and reactive to light.  Cardiovascular:     Rate and Rhythm: Normal rate and regular rhythm.  Pulmonary:     Comments: Decreased breath sounds bilaterally.  Abdominal:     Palpations: Abdomen is soft.  Musculoskeletal:        General: Normal range of motion.     Cervical back: Normal range of motion.  Skin:    General: Skin is warm.  Neurological:     General: No focal deficit present.     Mental Status: She is alert and oriented to person, place, and time.  Psychiatric:        Behavior: Behavior normal.        Judgment: Judgment normal.      LABORATORY DATA:  I have reviewed the data as listed Lab Results  Component Value Date   WBC 6.8 12/05/2021   HGB 11.3 (L) 12/05/2021   HCT 34.2 (L) 12/05/2021   MCV 97.7 12/05/2021   PLT 249 12/05/2021   Recent Labs    05/13/21 1434 12/05/21 1500  NA 141 141  K 3.9 3.8  CL 105 105  CO2 30 27  GLUCOSE 91 118*  BUN 23 19  CREATININE 1.03* 1.06*  CALCIUM 9.6 10.0  GFRNONAA 54* 52*  PROT 7.3 6.9  ALBUMIN 4.7 4.6  AST 20 32  ALT 19 24  ALKPHOS 47 41  BILITOT 0.5 0.6    RADIOGRAPHIC STUDIES: I have personally reviewed the radiological images as listed and agreed with the findings in the report. MR HIP RIGHT WO CONTRAST  Result Date: 11/18/2021 CLINICAL DATA:  Right hip pain, lateral pain, tenderness to the touch EXAM: MR OF THE RIGHT HIP WITHOUT CONTRAST TECHNIQUE: Multiplanar, multisequence MR imaging was performed. No intravenous contrast was administered. COMPARISON:  None Available. FINDINGS: Bones: No hip fracture, dislocation or avascular necrosis. No  periosteal reaction or bone destruction. No aggressive osseous lesion. Normal sacrum and sacroiliac joints. No SI joint widening or erosive changes. Degenerative disease with disc height loss at L2-3, L3-4, L4-5 and L5-S1 with bilateral facet arthropathy. Articular cartilage and labrum Articular cartilage:  No chondral defect. Labrum:  Left superior labral degeneration. Joint or bursal effusion Joint effusion:  No hip joint effusion.  No SI joint effusion. Bursae:  No bursal fluid. Muscles and tendons Flexors: Normal. Extensors: Normal. Abductors: Normal. Adductors: Normal. Gluteals: Edema in the right gluteus minimus muscle consistent with muscle strain. Moderate tendinosis of the right gluteus minimus tendon with a partial-thickness tear. Hamstrings: Normal. Other findings No pelvic free fluid. No fluid collection or hematoma. No inguinal lymphadenopathy. No inguinal hernia. IMPRESSION: 1. Edema in the right gluteus minimus muscle consistent with muscle strain. 2. Moderate tendinosis of the right gluteus minimus tendon with a partial-thickness tear. Electronically Signed   By: Kathreen Devoid M.D.   On: 11/18/2021 09:51  ASSESSMENT & PLAN:   Carcinoma of upper-outer quadrant of right breast in female, estrogen receptor positive ([place]) # JULY 2018- Multifocal RIGHT breast cancer ER Positive-stage I-high risk MammaPrint; no chemotherapy; second opinion at [facility];Dr.Marcom].   Continue Aromasin-tolerating well- ?  MSK pain- see below  #  Clinically no evidence of recurrence.  Mammogram June 2023 normal.  I again reviewed the benefit of extended AI.  See below regarding musculoskeletal pain  # MSK/Hx of OA-[s/p knee steroids-]-grade 1-2; chronic joint pains/arthritis-even before patient started Aromasin.  Monitor for now.  #  Osteoporosis:The BMD [July 2021] T-score of -2.8. Prolia today; BMD-T-score= -.2.8. continue with prolia.   # Anemia- mild Hb 11.3; recommend PO Iron gentle iron-poor tolerance/dairrhea.  2019- last colo-; recommend making appointment with GI.  Discussed regarding adding these foods.  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  #Bilateral lower extremity swelling-clinically not suspicious of DVT/PE.  Suspect dependent edema/venous stasis changes; recommend elevation.   *mychart # DISPOSITION: # Prolia today  # Follow up in  6 months for MD;  labs [facility] with diff, CMP, CA27.29], and Prolia; Dr.B       All questions were answered. The patient knows to call the clinic with any problems, questions or concerns.       Cammie Sickle, MD 12/05/2021 4:03 PM

## 2021-12-05 NOTE — Progress Notes (Signed)
Tendon tear of Right hip that is going to be treated with new medication by orthopaedic.

## 2021-12-06 ENCOUNTER — Ambulatory Visit: Payer: Medicare HMO | Admitting: Internal Medicine

## 2021-12-06 ENCOUNTER — Ambulatory Visit: Payer: Medicare HMO

## 2021-12-06 ENCOUNTER — Other Ambulatory Visit: Payer: Medicare HMO

## 2021-12-06 LAB — CANCER ANTIGEN 27.29: CA 27.29: 13.6 U/mL (ref 0.0–38.6)

## 2021-12-08 ENCOUNTER — Ambulatory Visit: Payer: Medicare HMO | Admitting: Internal Medicine

## 2021-12-08 ENCOUNTER — Other Ambulatory Visit: Payer: Medicare HMO

## 2021-12-08 ENCOUNTER — Ambulatory Visit: Payer: Medicare HMO

## 2022-01-04 ENCOUNTER — Encounter (INDEPENDENT_AMBULATORY_CARE_PROVIDER_SITE_OTHER): Payer: Self-pay | Admitting: Nurse Practitioner

## 2022-01-04 ENCOUNTER — Encounter (INDEPENDENT_AMBULATORY_CARE_PROVIDER_SITE_OTHER): Payer: Self-pay

## 2022-01-08 ENCOUNTER — Other Ambulatory Visit: Payer: Self-pay | Admitting: Internal Medicine

## 2022-01-09 ENCOUNTER — Emergency Department
Admission: EM | Admit: 2022-01-09 | Discharge: 2022-01-09 | Disposition: A | Discharge status: Other Acute Inpatient Hospital | Payer: Medicare HMO | Attending: Emergency Medicine | Admitting: Emergency Medicine

## 2022-01-09 ENCOUNTER — Encounter: Payer: Self-pay | Admitting: Internal Medicine

## 2022-01-09 ENCOUNTER — Emergency Department: Payer: Medicare HMO

## 2022-01-09 ENCOUNTER — Encounter: Payer: Self-pay | Admitting: Emergency Medicine

## 2022-01-09 ENCOUNTER — Encounter (HOSPITAL_COMMUNITY): Payer: Self-pay

## 2022-01-09 ENCOUNTER — Other Ambulatory Visit: Payer: Self-pay

## 2022-01-09 DIAGNOSIS — I2510 Atherosclerotic heart disease of native coronary artery without angina pectoris: Secondary | ICD-10-CM | POA: Insufficient documentation

## 2022-01-09 DIAGNOSIS — S2220XA Unspecified fracture of sternum, initial encounter for closed fracture: Secondary | ICD-10-CM | POA: Diagnosis not present

## 2022-01-09 DIAGNOSIS — I10 Essential (primary) hypertension: Secondary | ICD-10-CM | POA: Insufficient documentation

## 2022-01-09 DIAGNOSIS — Y92410 Unspecified street and highway as the place of occurrence of the external cause: Secondary | ICD-10-CM | POA: Insufficient documentation

## 2022-01-09 DIAGNOSIS — N8110 Cystocele, unspecified: Secondary | ICD-10-CM | POA: Diagnosis not present

## 2022-01-09 DIAGNOSIS — J3489 Other specified disorders of nose and nasal sinuses: Secondary | ICD-10-CM | POA: Diagnosis not present

## 2022-01-09 DIAGNOSIS — K449 Diaphragmatic hernia without obstruction or gangrene: Secondary | ICD-10-CM | POA: Insufficient documentation

## 2022-01-09 DIAGNOSIS — I700 Atherosclerosis of aorta: Secondary | ICD-10-CM | POA: Insufficient documentation

## 2022-01-09 DIAGNOSIS — R9082 White matter disease, unspecified: Secondary | ICD-10-CM | POA: Insufficient documentation

## 2022-01-09 DIAGNOSIS — K5730 Diverticulosis of large intestine without perforation or abscess without bleeding: Secondary | ICD-10-CM | POA: Insufficient documentation

## 2022-01-09 DIAGNOSIS — S2249XA Multiple fractures of ribs, unspecified side, initial encounter for closed fracture: Secondary | ICD-10-CM | POA: Diagnosis not present

## 2022-01-09 DIAGNOSIS — S299XXA Unspecified injury of thorax, initial encounter: Secondary | ICD-10-CM | POA: Diagnosis present

## 2022-01-09 LAB — CBC WITH DIFFERENTIAL/PLATELET
Abs Immature Granulocytes: 0.1 10*3/uL — ABNORMAL HIGH (ref 0.00–0.07)
Basophils Absolute: 0.1 10*3/uL (ref 0.0–0.1)
Basophils Relative: 1 %
Eosinophils Absolute: 0.1 10*3/uL (ref 0.0–0.5)
Eosinophils Relative: 1 %
HCT: 38.2 % (ref 36.0–46.0)
Hemoglobin: 12.6 g/dL (ref 12.0–15.0)
Immature Granulocytes: 1 %
Lymphocytes Relative: 20 %
Lymphs Abs: 2 10*3/uL (ref 0.7–4.0)
MCH: 31.5 pg (ref 26.0–34.0)
MCHC: 33 g/dL (ref 30.0–36.0)
MCV: 95.5 fL (ref 80.0–100.0)
Monocytes Absolute: 1 10*3/uL (ref 0.1–1.0)
Monocytes Relative: 10 %
Neutro Abs: 6.8 10*3/uL (ref 1.7–7.7)
Neutrophils Relative %: 67 %
Platelets: 281 10*3/uL (ref 150–400)
RBC: 4 MIL/uL (ref 3.87–5.11)
RDW: 13.9 % (ref 11.5–15.5)
WBC: 10 10*3/uL (ref 4.0–10.5)
nRBC: 0 % (ref 0.0–0.2)

## 2022-01-09 LAB — BASIC METABOLIC PANEL
Anion gap: 8 (ref 5–15)
BUN: 18 mg/dL (ref 8–23)
CO2: 25 mmol/L (ref 22–32)
Calcium: 9.8 mg/dL (ref 8.9–10.3)
Chloride: 106 mmol/L (ref 98–111)
Creatinine, Ser: 1.35 mg/dL — ABNORMAL HIGH (ref 0.44–1.00)
GFR, Estimated: 39 mL/min — ABNORMAL LOW (ref >60)
Glucose, Bld: 136 mg/dL — ABNORMAL HIGH (ref 70–99)
Potassium: 4.4 mmol/L (ref 3.5–5.1)
Sodium: 139 mmol/L (ref 135–145)

## 2022-01-09 LAB — TROPONIN I (HIGH SENSITIVITY)
Troponin I (High Sensitivity): 7 ng/L (ref <18)
Troponin I (High Sensitivity): 9 ng/L (ref <18)

## 2022-01-09 MED ORDER — OXYCODONE HCL 5 MG PO TABS: 5.0000 mg | ORAL_TABLET | ORAL | Status: DC | PRN

## 2022-01-09 MED ORDER — IOHEXOL 350 MG/ML SOLN
75.0000 mL | Freq: Once | INTRAVENOUS | Status: AC | PRN
  Administered 2022-01-09: 75 mL via INTRAVENOUS

## 2022-01-09 MED ORDER — FENTANYL CITRATE PF 50 MCG/ML IJ SOSY
50.0000 ug | PREFILLED_SYRINGE | Freq: Once | INTRAMUSCULAR | Status: AC
  Administered 2022-01-09: 50 ug via INTRAVENOUS
  Filled 2022-01-09: qty 1

## 2022-01-09 MED ORDER — ACETAMINOPHEN 500 MG PO TABS: 1000.0000 mg | ORAL_TABLET | Freq: Four times a day (QID) | ORAL | Status: DC

## 2022-01-09 MED ORDER — ONDANSETRON HCL 4 MG/2ML IJ SOLN
4.0000 mg | Freq: Once | INTRAMUSCULAR | Status: AC
  Administered 2022-01-09: 4 mg via INTRAVENOUS
  Filled 2022-01-09 (×2): qty 2

## 2022-01-09 MED ORDER — MORPHINE SULFATE (PF) 4 MG/ML IV SOLN
4.0000 mg | Freq: Once | INTRAVENOUS | Status: AC
  Administered 2022-01-09: 4 mg via INTRAVENOUS
  Filled 2022-01-09: qty 1

## 2022-01-09 MED ORDER — ACETAMINOPHEN 325 MG PO TABS
650.0000 mg | ORAL_TABLET | Freq: Once | ORAL | Status: AC
  Administered 2022-01-09: 650 mg via ORAL
  Filled 2022-01-09: qty 2

## 2022-01-09 MED ORDER — FENTANYL CITRATE PF 50 MCG/ML IJ SOSY
50.0000 ug | PREFILLED_SYRINGE | INTRAMUSCULAR | Status: DC | PRN
  Administered 2022-01-09: 50 ug via INTRAVENOUS
  Filled 2022-01-09: qty 1

## 2022-01-09 MED ORDER — METHOCARBAMOL 500 MG PO TABS
500.0000 mg | ORAL_TABLET | Freq: Three times a day (TID) | ORAL | Status: DC
  Administered 2022-01-09: 500 mg via ORAL
  Filled 2022-01-09: qty 1

## 2022-01-09 MED ORDER — SODIUM CHLORIDE 0.9 % IV BOLUS
500.0000 mL | Freq: Once | INTRAVENOUS | Status: AC
  Administered 2022-01-09: 500 mL via INTRAVENOUS

## 2022-01-09 MED ORDER — MORPHINE SULFATE (PF) 2 MG/ML IV SOLN: 2.0000 mg | INTRAVENOUS | Status: DC | PRN
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
## 2022-01-09 NOTE — ED Notes (Signed)
Pt stated "Don't used right arm for any blood because of breast cancer treatments"
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
## 2022-01-09 NOTE — ED Notes (Signed)
[facility]  CALLED PER  DR  PADUCHOWSKI MD

## 2022-01-09 NOTE — ED Notes (Signed)
XRAY  POWERSHARE  WITH  [facility] 
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
## 2022-01-09 NOTE — ED Notes (Signed)
XRAY  POWERSHARE  WITH  [facility]

## 2022-01-09 NOTE — ED Provider Notes (Signed)
-----------------------------------------   3:59 PM on 01/09/2022 ----------------------------------------- Additional CT imaging of the abdomen/pelvis shows right eighth rib fracture but no other intra-abdominal traumatic injuries.  CT imaging of the thoracic and lumbar spine is also negative for acute injury.  Patient remains in stable condition on reassessment with improved pain following IV fentanyl.  [facility] at bedside for transport to [facility] ED for trauma evaluation.   Blake Divine, MD 01/09/22 [phone]

## 2022-01-09 NOTE — ED Notes (Signed)
[facility] reviewed and all requirement documentation is up to date. Pt is ready for transfer.

## 2022-01-09 NOTE — ED Notes (Signed)
[facility]  called  for  transport  to  [facility]

## 2022-01-09 NOTE — ED Notes (Signed)
[facility]  CALLED  PER  DR PADUCHOWSKI  MD °

## 2022-01-23 ENCOUNTER — Encounter (INDEPENDENT_AMBULATORY_CARE_PROVIDER_SITE_OTHER): Payer: Self-pay

## 2022-04-18 ENCOUNTER — Telehealth: Payer: Self-pay | Admitting: *Deleted

## 2022-04-18 NOTE — Telephone Encounter (Signed)
Patient called reporting that her insurance has changed and that she needs [place] to call her new insurance company to ask for a tier change for her Exemestane so she can get it cheaper. I asked Alyson if she knew what patient was asking and she said it is called a tier exception request and that the team nurse would need to handle it.

## 2022-04-18 NOTE — Telephone Encounter (Signed)
Pharmacy will need to submit the refill thru new insurance to see what exactly is needed.  The pharmacy should send [place] more information about next step that needs to be taken.

## 2022-04-19 ENCOUNTER — Encounter: Payer: Self-pay | Admitting: Internal Medicine

## 2022-04-19 NOTE — Telephone Encounter (Signed)
Spoke to patient and explained process of insurance authorization.  Since she has new insurance and a new pharmacy ([facility] which specific pharmacy).  I asked that she contact her new contact her new [facility] to request the Exemestane be filled so it can go thru the new insurance process.  Explained to patient it would be more efficient if the new pharmacy, as well as the new insurance company, has the medication information to include price of copay prior to submitting tier exception.  Patient is to call the office back with more information about her new mail order pharmacy for an update in her chart.

## 2022-04-20 ENCOUNTER — Telehealth: Payer: Self-pay

## 2022-04-20 NOTE — Telephone Encounter (Addendum)
Please see phone note message from today (04/20/22) for pharmacy information.  [facility] and they deferred me to [place] [phone]) due to patient have [employer].  Called [employer] [phone] [phone]) for clarification/request for tier exception for Exemestane.    I have also submitted a tier exception on Cover My Meds (Key: [id number])  Submitted tier exception request via phone with [place].

## 2022-04-20 NOTE — Telephone Encounter (Signed)
New mail order pharmacy added to her preferred pharmacy list.  Please see previous message regarding tier exception request for Exemestane.

## 2022-04-20 NOTE — Telephone Encounter (Signed)
Patient states that she spoke with Allison Hall yesterday about her exemestane rx. She states that she needs to have the tier changed. [facility] and their number is [phone].

## 2022-04-24 NOTE — Telephone Encounter (Signed)
Tier Exception request appvoed for Tir 1 coverage starting 04/20/22 thru 04/21/23.

## 2022-06-05 ENCOUNTER — Inpatient Hospital Stay (HOSPITAL_BASED_OUTPATIENT_CLINIC_OR_DEPARTMENT_OTHER): Payer: Medicare Other | Admitting: Internal Medicine

## 2022-06-05 ENCOUNTER — Other Ambulatory Visit: Payer: Self-pay

## 2022-06-05 ENCOUNTER — Inpatient Hospital Stay: Payer: Medicare Other | Attending: Internal Medicine

## 2022-06-05 ENCOUNTER — Inpatient Hospital Stay: Payer: Medicare Other

## 2022-06-05 ENCOUNTER — Encounter: Payer: Self-pay | Admitting: Internal Medicine

## 2022-06-05 VITALS — BP 117/69 | HR 87 | Temp 97.4°F | Resp 18 | Wt 193.2 lb

## 2022-06-05 DIAGNOSIS — Z79811 Long term (current) use of aromatase inhibitors: Secondary | ICD-10-CM | POA: Insufficient documentation

## 2022-06-05 DIAGNOSIS — Z170 Estrogen receptor positive status [ER+]: Secondary | ICD-10-CM | POA: Diagnosis not present

## 2022-06-05 DIAGNOSIS — C50411 Malignant neoplasm of upper-outer quadrant of right female breast: Secondary | ICD-10-CM | POA: Insufficient documentation

## 2022-06-05 DIAGNOSIS — M810 Age-related osteoporosis without current pathological fracture: Secondary | ICD-10-CM

## 2022-06-05 DIAGNOSIS — M85851 Other specified disorders of bone density and structure, right thigh: Secondary | ICD-10-CM | POA: Insufficient documentation

## 2022-06-05 LAB — CBC WITH DIFFERENTIAL/PLATELET
Abs Immature Granulocytes: 0.03 10*3/uL (ref 0.00–0.07)
Basophils Absolute: 0.1 10*3/uL (ref 0.0–0.1)
Basophils Relative: 1 %
Eosinophils Absolute: 0.1 10*3/uL (ref 0.0–0.5)
Eosinophils Relative: 1 %
HCT: 34.3 % — ABNORMAL LOW (ref 36.0–46.0)
Hemoglobin: 11.1 g/dL — ABNORMAL LOW (ref 12.0–15.0)
Immature Granulocytes: 1 %
Lymphocytes Relative: 21 %
Lymphs Abs: 1.3 10*3/uL (ref 0.7–4.0)
MCH: 30.2 pg (ref 26.0–34.0)
MCHC: 32.4 g/dL (ref 30.0–36.0)
MCV: 93.5 fL (ref 80.0–100.0)
Monocytes Absolute: 0.8 10*3/uL (ref 0.1–1.0)
Monocytes Relative: 13 %
Neutro Abs: 3.9 10*3/uL (ref 1.7–7.7)
Neutrophils Relative %: 63 %
Platelets: 264 10*3/uL (ref 150–400)
RBC: 3.67 MIL/uL — ABNORMAL LOW (ref 3.87–5.11)
RDW: 13.9 % (ref 11.5–15.5)
WBC: 6.1 10*3/uL (ref 4.0–10.5)
nRBC: 0 % (ref 0.0–0.2)

## 2022-06-05 LAB — COMPREHENSIVE METABOLIC PANEL
ALT: 19 U/L (ref 0–44)
AST: 26 U/L (ref 15–41)
Albumin: 4.7 g/dL (ref 3.5–5.0)
Alkaline Phosphatase: 44 U/L (ref 38–126)
Anion gap: 9 (ref 5–15)
BUN: 26 mg/dL — ABNORMAL HIGH (ref 8–23)
CO2: 24 mmol/L (ref 22–32)
Calcium: 9.4 mg/dL (ref 8.9–10.3)
Chloride: 104 mmol/L (ref 98–111)
Creatinine, Ser: 1.01 mg/dL — ABNORMAL HIGH (ref 0.44–1.00)
GFR, Estimated: 55 mL/min — ABNORMAL LOW (ref >60)
Glucose, Bld: 104 mg/dL — ABNORMAL HIGH (ref 70–99)
Potassium: 3.7 mmol/L (ref 3.5–5.1)
Sodium: 137 mmol/L (ref 135–145)
Total Bilirubin: 0.5 mg/dL (ref 0.3–1.2)
Total Protein: 7.3 g/dL (ref 6.5–8.1)

## 2022-06-05 LAB — IRON AND TIBC
Iron: 84 ug/dL (ref 28–170)
Saturation Ratios: 17 % (ref 10.4–31.8)
TIBC: 482 ug/dL — ABNORMAL HIGH (ref 250–450)
UIBC: 398 ug/dL

## 2022-06-05 LAB — FERRITIN: Ferritin: 16 ng/mL (ref 11–307)

## 2022-06-05 MED ORDER — DENOSUMAB 60 MG/ML ~~LOC~~ SOSY
60.0000 mg | PREFILLED_SYRINGE | Freq: Once | SUBCUTANEOUS | Status: AC
  Administered 2022-06-05: 60 mg via SUBCUTANEOUS
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
## 2022-06-05 NOTE — Addendum Note (Signed)
Addended by: Vanice Sarah on: 06/05/2022 03:50 PM   Modules accepted: Orders

## 2022-06-05 NOTE — Assessment & Plan Note (Addendum)
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  Continue Aromasin-tolerating well- ?  MSK pain- see below  # Clinically no evidence of recurrence.  Mammogram June 2023 normal.  I again reviewed the benefit of extended AI- however, given the on going issues regarding musculoskeletal pain/hair loss. OK to HOLD off for 3 months-    # OCT 2023- CT scan CAP- Incidental Hyperdense 1.1 cm posterior interpolar left renal cortical lesion, indeterminate. Recommend further characterization with renal mass protocol MRI (preferred) or CT abdomen without and with IV contrast on a short term outpatient basis. Would want to wait/ wants to bring to PCP attention.   # OCT 2023- CT scan CAP- Incidental Simple 5.4 cm right adnexal cyst. Recommend short-term outpatient follow-up with pelvic ultrasound. Patient wants to defer to PCP for work up.   # MSK/Hx of OA-[s/p knee steroids; Dr.Poggi]-grade 1-2; chronic joint pains/arthritis-even before patient started Aromasin.  Monitor for now.  #  Osteoporosis:The BMD [July 2021] T-score of -2.8. Prolia today; AUG 2023- BMD-T-score= -.2.8. continue with prolia. Stable.    # Anemia- mild Hb 11.3; recommend PO Iron gentle iron-poor tolerance/dairrhea. 2019- last colo- Discussed regarding adding these foods. stable.  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  # Bilateral lower extremity swelling-clinically not suspicious of DVT/PE.  Suspect dependent edema/venous stasis changes; recommend elevation.   *mychart # DISPOSITION: # Prolia today  # Follow up in  3  months for MD; NO labs- Dr.B

## 2022-06-06 LAB — CANCER ANTIGEN 27.29: CA 27.29: <9 U/mL (ref 0.0–38.6)

## 2022-07-26 ENCOUNTER — Other Ambulatory Visit: Payer: Self-pay | Admitting: Family Medicine

## 2022-07-26 DIAGNOSIS — Z1231 Encounter for screening mammogram for malignant neoplasm of breast: Secondary | ICD-10-CM

## 2022-08-01 ENCOUNTER — Ambulatory Visit
Admission: EM | Admit: 2022-08-01 | Discharge: 2022-08-01 | Disposition: A | Discharge status: Home / Self Care | Payer: Medicare Other | Attending: Emergency Medicine | Admitting: Emergency Medicine

## 2022-08-01 ENCOUNTER — Ambulatory Visit (INDEPENDENT_AMBULATORY_CARE_PROVIDER_SITE_OTHER): Payer: Medicare Other

## 2022-08-01 DIAGNOSIS — M1611 Unilateral primary osteoarthritis, right hip: Secondary | ICD-10-CM

## 2022-08-01 NOTE — ED Provider Notes (Signed)
[facility]    CSN: [id number] Arrival date & time: 08/01/22  1207      History   Chief Complaint Chief Complaint  Patient presents with   Hip Pain    HPI Allison Hall is a 82 y.o. female.   Patient presents for evaluation of a discomfort to the right hip present for 1 month after being pulled by her old [place].  Endorses that there is intermittent mild pain but primarily discomfort when walking.  Feels as if the hip joint is not fully engaged causing her to feel unbalanced.  Endorses history of chronic left knee pain that is exacerbated symptoms that she is having to compensate.  Currently using walker for stability.  Managing mild discomfort with ibuprofen which has been effective.  Is followed by orthopedics and a chiropractor, requesting x-ray of the hip today prior to further evaluation doctors.  Additionally has attempted use of ice. Past Medical History:  Diagnosis Date   Anemia    in past   Anxiety    Breast cancer (HCC) 08/24/2016   right breast UOQ   Cancer (HCC)    skin ca   Colon polyp    COVID-19 02/14/2020   received treatment   Depression    GERD (gastroesophageal reflux disease)    Hypercholesteremia    Hypertension    Osteoarthritis    Personal history of radiation therapy 2018   RIGHT BREAST CA UOQ   Scoliosis of lumbar spine    Thyroid nodule 04/17/2016    Patient Active Problem List   Diagnosis Date Noted   Hair thinning 05/04/2020   Chronic pain of left knee 04/04/2018   Chronic pain syndrome 04/04/2018   Iron deficiency anemia 10/31/2017   B12 deficiency 10/31/2017   Arthralgia 06/03/2017   Osteoporosis 10/04/2016   Carcinoma of upper-outer quadrant of right breast in female, estrogen receptor positive (HCC) 08/24/2016   Closed fracture of distal end of ulna 06/01/2015   Triggering of digit 05/27/2014   Hamstring muscle strain 05/26/2014   Arthritis of knee, degenerative 03/26/2014   Long term current use of  opiate analgesic 04/21/2013   Arthritis, degenerative 04/21/2013   Anxiety 10/05/2011   Colon polyp 10/05/2011   Esophageal foreign body 10/05/2011   Acid reflux 10/05/2011   BP (high blood pressure) 10/05/2011   Hypercholesterolemia 10/05/2011    Past Surgical History:  Procedure Laterality Date   ABDOMINAL HYSTERECTOMY     APPENDECTOMY     BREAST BIOPSY Right 08/24/2016   9:30 - invasive mammary carcinoma with lobular features   BREAST BIOPSY Right 08/24/2016   10:00 - invasive mammary carcinoma with lobular features   BREAST LUMPECTOMY Right 10/09/2016   INVASIVE CARCINOMA, TUBULO-LOBULAR VARIANT, CLEAR MARGINS, NEGATIVE LN'S   CATARACT EXTRACTION W/PHACO Left 04/06/2020   Procedure: CATARACT EXTRACTION PHACO AND INTRAOCULAR LENS PLACEMENT (IOC) LEFT;  Surgeon: Galen Manila, MD;  Location: [facility];  Service: Ophthalmology;  Laterality: Left;  5.49 0:33.9   CATARACT EXTRACTION W/PHACO Right 04/20/2020   Procedure: CATARACT EXTRACTION PHACO AND INTRAOCULAR LENS PLACEMENT (IOC) RIGHT;  Surgeon: Galen Manila, MD;  Location: [facility];  Service: Ophthalmology;  Laterality: Right;  7.26 0:47.2   COLONOSCOPY W/ POLYPECTOMY  2003, 2008, 2014   COLONOSCOPY WITH PROPOFOL N/A 12/03/2017   Procedure: COLONOSCOPY WITH PROPOFOL;  Surgeon: Scot Jun, MD;  Location: [facility];  Service: Endoscopy;  Laterality: N/A;   ESOPHAGOGASTRODUODENOSCOPY     foreign body   ESOPHAGOGASTRODUODENOSCOPY (EGD) WITH PROPOFOL  N/A 12/03/2017   Procedure: ESOPHAGOGASTRODUODENOSCOPY (EGD) WITH PROPOFOL;  Surgeon: Scot Jun, MD;  Location: [facility];  Service: Endoscopy;  Laterality: N/A;   JOINT REPLACEMENT Right 2008   TKR   NOSE SURGERY     PARTIAL MASTECTOMY WITH NEEDLE LOCALIZATION Right 09/29/2016   Procedure: PARTIAL MASTECTOMY WITH NEEDLE LOCALIZATION;  Surgeon: Nadeen Landau, MD;  Location: [facility];  Service: General;  Laterality: Right;    REPLACEMENT TOTAL KNEE Right 2008   SENTINEL NODE BIOPSY Right 09/29/2016   Procedure: SENTINEL NODE BIOPSY;  Surgeon: Nadeen Landau, MD;  Location: [facility];  Service: General;  Laterality: Right;    OB History   No obstetric history on file.      Home Medications    Prior to Admission medications   Medication Sig Start Date End Date Taking? Authorizing Provider  Calcium Carb-Cholecalciferol 600-400 MG-UNIT TABS 1200 + 1000 D once daily   Yes [provider]  calcium carbonate 1250 MG capsule Take 1,250 mg by mouth daily.    Yes [provider]  Cholecalciferol (VITAMIN D3) 1000 units CAPS Take 2,000 Units by mouth 2 (two) times daily.    Yes [provider]  denosumab (PROLIA) 60 MG/ML SOLN injection Inject 60 mg into the skin every 6 (six) months. Administer in upper arm, thigh, or abdomen   Yes [provider]  ibuprofen (ADVIL) 400 MG tablet Take 400 mg by mouth every 4 (four) hours as needed. 02/28/22  Yes [provider]  lisinopril (ZESTRIL) 40 MG tablet Take 40 mg by mouth daily. 11/24/18  Yes [provider]  loperamide (IMODIUM) 2 MG capsule Take 2 mg by mouth as needed for diarrhea or loose stools.    Yes [provider]  Magnesium 250 MG TABS Take 500 mg by mouth daily.    Yes [provider]  metoprolol succinate (TOPROL-XL) 25 MG 24 hr tablet Take 1 tablet by mouth daily. 07/31/22 07/31/23 Yes [provider]  omeprazole (PRILOSEC) 40 MG capsule Take by mouth. 08/04/20  Yes [provider]  Turmeric Curcumin 500 MG CAPS Take 1,500 mg by mouth daily.    Yes [provider]  acetaminophen (TYLENOL) 500 MG tablet Take 650 mg by mouth every 6 (six) hours as needed.    [provider]  amLODipine (NORVASC) 10 MG tablet Take 1 tablet by mouth daily. 07/15/20   [provider]  Ascorbic Acid (VITAMIN C) 100 MG tablet Take 100 mg by mouth daily.    [provider]  baclofen (LIORESAL) 10 MG tablet Take 10 mg by mouth 3 (three) times daily. 11/14/21   [provider]  buPROPion (WELLBUTRIN XL) 300 MG 24 hr tablet Take by mouth. 07/15/20   [provider]  busPIRone (BUSPAR) 10 MG tablet Take 10 mg by mouth 3 (three) times daily. 04/17/16   [provider]  busPIRone (BUSPAR) 10 MG tablet Take 1 tablet by mouth 2 (two) times daily. 07/15/20   [provider]  casirivimab-imdevimab (REGEN-COV) 600-600 MG/10ML injection REGEN-COV (EUA) 60 mg-60 mg/mL intravenous solution  Administer four (4), 2.5 ml subcutaneous injections consecutively, each at different injection sites, into the though, back of upper arm, or abdomen.    [provider]  exemestane (AROMASIN) 25 MG tablet TAKE 1 TABLET DAILY AFTER  BREAKFAST Patient not taking: Reported on 06/05/2022 01/09/22   Earna Coder, MD  gabapentin (NEURONTIN) 100 MG capsule Take by mouth. 11/24/21   [provider]  meloxicam (MOBIC) 7.5 MG tablet Take by mouth. 11/16/21   [provider]  rosuvastatin (CRESTOR) 5 MG tablet Take 5 mg by mouth daily. 04/17/16 06/05/22  [provider]  tiZANidine (ZANAFLEX) 2 MG tablet Take 1 tablet (2 mg total) by mouth every 8 (eight) hours as needed for muscle spasms. Sedation precaution 10/30/21   Guy Sandifer L, PA  vitamin B-12 (CYANOCOBALAMIN) 500 MCG tablet Take 1,000 mcg by mouth every other day.     [provider]    Family History Family History  Problem Relation Age of Onset   Hypertension Mother    Depression Mother    Heart failure Father    Obesity Brother    Heart disease Brother    Stroke Brother    Depression Brother    Breast cancer Cousin    Breast cancer Cousin    Breast cancer Cousin     Social History Social History   Tobacco Use   Smoking status: Former    Packs/day: 1.00    Years: 10.00    Additional pack years: 0.00    Total pack years: 10.00    Types:  Cigarettes    Quit date: 05/18/1965    Years since quitting: 57.2   Smokeless tobacco: Never  Vaping Use   Vaping Use: Never used  Substance Use Topics   Alcohol use: Not Currently    Alcohol/week: 7.0 standard drinks of alcohol    Types: 7 Glasses of wine per week   Drug use: No     Allergies   Sulfamethoxazole, Other, Statins, Trazodone, Ciprofloxacin, and Sulfa antibiotics   Review of Systems Review of Systems   Physical Exam Triage Vital Signs ED Triage Vitals  Enc Vitals Group     BP 08/01/22 1226 138/76     Pulse Rate 08/01/22 1226 83     Resp --      Temp 08/01/22 1226 98.6 F (37 C)     Temp Source 08/01/22 1226 Oral     SpO2 08/01/22 1226 97 %     Weight 08/01/22 1221 185 lb (83.9 kg)     Height 08/01/22 1221 5\' 4"  (1.626 m)     Head Circumference --      Peak Flow --      Pain Score 08/01/22 1220 2     Pain Loc --      Pain Edu? --      Excl. in GC? --    No data found.  Updated Vital Signs BP 138/76 (BP Location: Left Arm)   Pulse 83   Temp 98.6 F (37 C) (Oral)   Ht 5\' 4"  (1.626 m)   Wt 185 lb (83.9 kg)   SpO2 97%   BMI 31.76 kg/m   Visual Acuity Right Eye Distance:   Left Eye Distance:   Bilateral Distance:    Right Eye Near:   Left Eye Near:    Bilateral Near:     Physical Exam Constitutional:      Appearance: Normal appearance.  Eyes:     Extraocular Movements: Extraocular movements intact.  Musculoskeletal:     Comments: Unable to reproduce tenderness on exam, no ecchymosis, swelling or deformity, able to complete all jaw range of motion without eliciting pain, able to bear weight   Neurological:     Mental Status: She is alert.      UC Treatments / Results  Labs (all labs ordered are listed, but only abnormal results are  displayed) Labs Reviewed - No data to display  EKG   Radiology DG Hip Unilat With Pelvis 2-3 Views Right  Result Date: 08/01/2022 CLINICAL DATA:  Hip pain EXAM: DG HIP (WITH OR WITHOUT PELVIS) 2-3V  RIGHT COMPARISON:  None Available. FINDINGS: There is no evidence of hip fracture or dislocation. Mild right hip osteoarthritis with acetabular lip spurring. Multilevel degenerative disc disease of the lower lumbar spine. IMPRESSION: 1. No acute fracture or dislocation. 2. Mild right hip osteoarthritis. Electronically Signed   By: Larose Hires D.O.   On: 08/01/2022 12:56    Procedures Procedures (including critical care time)  Medications Ordered in UC Medications - No data to display  Initial Impression / Assessment and Plan / UC Course  I have reviewed the triage vital signs and the nursing notes.  Pertinent labs & imaging results that were available during my care of the patient were reviewed by me and considered in my medical decision making (see chart for details).  Primary osteoarthritis of right hip  Confirmed on x-ray, discussed diagnosis with patient, patient would like to continue management of discomfort with ibuprofen as it has been effective, plans to follow-up with her chiropractor Final Clinical Impressions(s) / UC Diagnoses   Final diagnoses:  Primary osteoarthritis of right hip     Discharge Instructions      X-ray shows osteoarthritis which is an inflammatory process that affects the joints  Arthritis is typically managed supportive care through anti-inflammatory medicines, ice and heat and rest, for severe cases may directed directly into the joint space which is done by the specialist  You may continue using your walker for stability and support   ED Prescriptions   None    PDMP not reviewed this encounter.   Valinda Hoar, NP 08/01/22 1443

## 2022-08-01 NOTE — ED Triage Notes (Signed)
Pt c/o right hip injury x52month  Pt states that she was injured at a doctors office and only wants to state that they pulled on it the wrong way.   Pt asks for an xray.  Pt states that there is no pain, but she has trouble walking due to her right hip. Pt states that she is off balance and something feels wrong with her hip.

## 2022-08-01 NOTE — Discharge Instructions (Addendum)
X-ray shows osteoarthritis which is an inflammatory process that affects the joints  Arthritis is typically managed supportive care through anti-inflammatory medicines, ice and heat and rest, for severe cases may directed directly into the joint space which is done by the specialist  You may continue using your walker for stability and support  Follow-up with your orthopedic specialist and chiropractor for further management

## 2022-09-04 ENCOUNTER — Inpatient Hospital Stay: Payer: Medicare Other | Admitting: Internal Medicine

## 2022-09-11 ENCOUNTER — Ambulatory Visit
Admission: RE | Admit: 2022-09-11 | Discharge: 2022-09-11 | Disposition: A | Discharge status: Home / Self Care | Payer: Medicare Other | Source: Ambulatory Visit | Attending: Family Medicine | Admitting: Family Medicine

## 2022-09-11 DIAGNOSIS — Z1231 Encounter for screening mammogram for malignant neoplasm of breast: Secondary | ICD-10-CM | POA: Insufficient documentation

## 2022-09-12 ENCOUNTER — Other Ambulatory Visit: Payer: Self-pay | Admitting: Surgery

## 2022-09-12 DIAGNOSIS — S76011D Strain of muscle, fascia and tendon of right hip, subsequent encounter: Secondary | ICD-10-CM

## 2022-09-18 ENCOUNTER — Encounter: Payer: Self-pay | Admitting: Surgery

## 2022-09-18 ENCOUNTER — Encounter: Payer: Self-pay | Admitting: Internal Medicine

## 2022-09-19 ENCOUNTER — Inpatient Hospital Stay: Payer: Medicare Other | Attending: Internal Medicine | Admitting: Internal Medicine

## 2022-09-19 ENCOUNTER — Encounter: Payer: Self-pay | Admitting: Internal Medicine

## 2022-09-19 VITALS — BP 141/71 | HR 78 | Temp 98.4°F | Ht 64.0 in | Wt 179.2 lb

## 2022-09-19 DIAGNOSIS — Z170 Estrogen receptor positive status [ER+]: Secondary | ICD-10-CM | POA: Diagnosis not present

## 2022-09-19 DIAGNOSIS — C50411 Malignant neoplasm of upper-outer quadrant of right female breast: Secondary | ICD-10-CM | POA: Insufficient documentation

## 2022-09-19 DIAGNOSIS — Z87891 Personal history of nicotine dependence: Secondary | ICD-10-CM | POA: Diagnosis not present

## 2022-09-19 DIAGNOSIS — Z79811 Long term (current) use of aromatase inhibitors: Secondary | ICD-10-CM | POA: Insufficient documentation

## 2022-09-19 NOTE — Progress Notes (Signed)
Was asking if she is getting labs today?

## 2022-09-19 NOTE — Progress Notes (Signed)
[facility] CONSULT NOTE  Patient Care Team: Lorrine Kin, MD as PCP - General (Family Medicine) Nadeen Landau, MD (Inactive) as Referring Physician (Surgery)  CHIEF COMPLAINTS/PURPOSE OF CONSULTATION: Breast cancer  #  Oncology History Overview Note        Assessment:  Allison Hall is a 82 y.o. female with multi-focal right breast cancer s/p partial mastectomy with sentinel lymph node biopsy on 09/29/2016.  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  Three sentinel lymph nodes were negative.  Tumor was ER + (> 90%), PR + (> 90%) and Her2/neu 2+ (negative by FISH).  Pathologic stage was T1c(m) N0(sn).  CA27.29 was 13.4 on 09/06/2016.   MammaPrint revealed a 29% risk of recurrence (high).  She was seen for second opinion at [facility] on 10/25/2016.  Chemotherapy was not recommended.   Bilateral screening mammogram on 07/24/2016 revealed a possible area of distortion in the right breast. Diagnostic mammogram on 08/10/2016 revealed 2 suspicious masses in the upper-outer quadrant of the right breast in a broad area of confluent distortion in the upper-outer quadrant of the right breast.  There are at least 3 areas of confluent distortion spanning approximately 6.5 cm of tissue.  Ultrasound on 07/31/2016 revealed  an irregular hypoechoic 1.2 x 1.1 x 1.2 cm at the 9:30 position 7 cm from the nipple.  In addition, there was an irregular 1.9 x 0.8 x 1.2 cm mass at the 10 o'clock position 5 cm from the nipple.  There were multiple small normal-appearing lymph nodes.   Breast MRI on 09/16/2016 revealed an irregular 1.6 x 1.2 x 1.6 cm enhancing mass adjacent to the clip artifact within the upper, outer right breast, middle depth, corresponding to the mass seen at 10 o'clock. There was an additional irregular enhancing mass adjacent to the more posterior clip artifact  measuring 2.6 x 1.4 x 1.6 cm, corresponding  to the mass seen at 9:30 position. Together, these 2 masses span 6.3 cm of breast tissue from anterior to posterior. There was no definite additional suspicious area of enhancement within the right breast to correspond to the third area of distortion identified on diagnostic mammogram dated 08/10/2016.  Left breast revealed no suspicious mass or enhancement.   Bilateral diagnostic mammogram on 09/03/2019 revealed no evidence of malignancy.   She received radiation from 11/15/2016 - 01/08/2017.  She was Femara (01/17/2017 - 05/16/2017) secondary to arthralgias.  She discontinued Arimidex after 19 days (last 06/14/2017) secondary to fatigue and polyarthralgias.  She began Aromasin on 06/27/2017.   CA27.29 has been followed:  13.4 on 09/06/2016, 13.0 on 05/16/2017, 17.1 on 08/29/2017, 12.1 on 06/04/2018, 13.8 on 10/01/2018, and 15.6 on 02/04/2019.   Bone density study on 09/30/2015 revealed osteopenia with a T-score of -2.1 in the right femoral neck.  Bone density study revealed osteoporosis on 10/02/2017 with a T-score of -3.1. She began Prolia on 02/14/2017 (last 10/09/2019).   She was diagnosed with iron deficiency anemia and B12 deficiency on 08/29/2017.  TSH and folate were normal.  She is on oral B12 500 mcg a day.  Intrinsic factor and anti-parietal cell antibodies were normal on 01/02/2018.  B12 was 888 on 01/29/2018 and 544 on 02/04/2019.   She received Venofer weekly x 3 (10/31/2017 - 11/13/2017) and x 4 (06/05/2018 - 06/26/2018).   Ferritin has been followed: 10 on 08/29/2017, 174 on 12/12/2017, 74 on 01/02/2018, 64 on 01/29/2018,  3 on 06/04/2018, 205 on 10/01/2018, and 162 on 02/04/2019.   EGD and colonoscopy on 12/03/2017. Polyps were noted. Pathology was negative for dysplasia and malignancy.    She tested positive for COVID-19 on 02/14/2020.   Symptomatically, she is doing well.  She denies any breast concerns.  She has some hair thinning.   Exam reveals no evidence of recurrent disease.   Pl   Carcinoma of upper-outer quadrant of right breast in female, estrogen receptor positive (HCC)  08/24/2016 Initial Diagnosis   Carcinoma of upper-outer quadrant of breast in female, estrogen receptor positive (HCC)     HISTORY OF PRESENTING ILLNESS: Alone.  Ambulating independently.  Allison Hall 82 y.o.  female patient with stage I multifocal ER/PR positive HER2 negative breast cancer currently on adjuvant Aromasin [on Extended; Mammaprint-high risk]- is here for a follow up/review mammogram.  Patient's Aromasin was held 3 months ago in March 2024 given the ongoing hair loss and also possible joint pains.  Patient noted to have improvement of hair loss since stopping Aromasin.  However she continues to have worsening hip pain/knee pain not any better.  She is currently awaiting evaluation with orthopedics tomorrow.  Patient continues to have other chronic joint pains not any worse.Otherwise denies any nausea vomiting headaches.  Chronic mild intermittent swelling in the legs.  Review of Systems  Constitutional:  Positive for malaise/fatigue. Negative for chills, diaphoresis, fever and weight loss.  HENT:  Negative for nosebleeds and sore throat.   Eyes:  Negative for double vision.  Respiratory:  Negative for cough, hemoptysis, sputum production, shortness of breath and wheezing.   Cardiovascular:  Negative for chest pain, palpitations, orthopnea and leg swelling.  Gastrointestinal:  Negative for abdominal pain, blood in stool, constipation, diarrhea, heartburn, melena, nausea and vomiting.  Genitourinary:  Negative for dysuria, frequency and urgency.  Musculoskeletal:  Positive for back pain and joint pain.  Skin: Negative.  Negative for itching and rash.  Neurological:  Negative for dizziness, tingling, focal weakness, weakness and headaches.  Endo/Heme/Allergies:  Does not bruise/bleed easily.       Hair loss.   Psychiatric/Behavioral:  Negative for depression. The patient is not nervous/anxious and does not have insomnia.      MEDICAL HISTORY:  Past Medical History:  Diagnosis Date   Anemia    in past   Anxiety    Breast cancer (HCC) 08/24/2016   right breast UOQ   Cancer (HCC)    skin ca   Colon polyp    COVID-19 02/14/2020   received treatment   Depression    GERD (gastroesophageal reflux disease)    Hypercholesteremia    Hypertension    Osteoarthritis    Personal history of radiation therapy 2018   RIGHT BREAST CA UOQ   Scoliosis of lumbar spine    Thyroid nodule 04/17/2016    SURGICAL HISTORY: Past Surgical History:  Procedure Laterality Date   ABDOMINAL HYSTERECTOMY     APPENDECTOMY     BREAST BIOPSY Right 08/24/2016   9:30 - invasive mammary carcinoma with lobular features   BREAST BIOPSY Right 08/24/2016   10:00 - invasive mammary carcinoma with lobular features   BREAST LUMPECTOMY Right 10/09/2016   INVASIVE CARCINOMA, TUBULO-LOBULAR VARIANT, CLEAR MARGINS, NEGATIVE LN'S   CATARACT EXTRACTION W/PHACO Left 04/06/2020   Procedure: CATARACT EXTRACTION PHACO AND INTRAOCULAR LENS PLACEMENT (IOC) LEFT;  Surgeon: Galen Manila, MD;  Location: [facility];  Service: Ophthalmology;  Laterality: Left;  5.49 0:33.9   CATARACT EXTRACTION W/PHACO  Right 04/20/2020   Procedure: CATARACT EXTRACTION PHACO AND INTRAOCULAR LENS PLACEMENT (IOC) RIGHT;  Surgeon: Galen Manila, MD;  Location: [facility];  Service: Ophthalmology;  Laterality: Right;  7.26 0:47.2   COLONOSCOPY W/ POLYPECTOMY  2003, 2008, 2014   COLONOSCOPY WITH PROPOFOL N/A 12/03/2017   Procedure: COLONOSCOPY WITH PROPOFOL;  Surgeon: Scot Jun, MD;  Location: [facility] END[facility];  Service: Endoscopy;  Laterality: N/A;   ESOPHAGOGASTRODUODENOSCOPY     foreign body   ESOPHAGOGASTRODUODENOSCOPY (EGD) WITH PROPOFOL N/A 12/03/2017   Procedure: ESOPHAGOGASTRODUODENOSCOPY (EGD) WITH PROPOFOL;  Surgeon:  Scot Jun, MD;  Location: [facility] ENDOSCOPY;  Service: Endoscopy;  Laterality: N/A;   JOINT REPLACEMENT Right 2008   TKR   NOSE SURGERY     PARTIAL MASTECTOMY WITH NEEDLE LOCALIZATION Right 09/29/2016   Procedure: PARTIAL MASTECTOMY WITH NEEDLE LOCALIZATION;  Surgeon: Nadeen Landau, MD;  Location: [facility] ORS;  Service: General;  Laterality: Right;   REPLACEMENT TOTAL KNEE Right 2008   SENTINEL NODE BIOPSY Right 09/29/2016   Procedure: SENTINEL NODE BIOPSY;  Surgeon: Nadeen Landau, MD;  Location: [facility] OR[facility];  Service: General;  Laterality: Right;    SOCIAL HISTORY: Social History   Socioeconomic History   Marital status: Widowed    Spouse name: Not on file   Number of children: Not on file   Years of education: Not on file   Highest education level: Not on file  Occupational History   Not on file  Tobacco Use   Smoking status: Former    Packs/day: 1.00    Years: 10.00    Additional pack years: 0.00    Total pack years: 10.00    Types: Cigarettes    Quit date: 05/18/1965    Years since quitting: 57.3   Smokeless tobacco: Never  Vaping Use   Vaping Use: Never used  Substance and Sexual Activity   Alcohol use: Not Currently    Alcohol/week: 7.0 standard drinks of alcohol    Types: 7 Glasses of wine per week   Drug use: No   Sexual activity: Not Currently  Other Topics Concern   Not on file  Social History Narrative   Not on file   Social Determinants of Health   Financial Resource Strain: Low Risk  (12/03/2017)   Overall Financial Resource Strain (CARDIA)    Difficulty of Paying Living Expenses: Not hard at all  Food Insecurity: No Food Insecurity (12/03/2017)   Hunger Vital Sign    Worried About Running Out of Food in the Last Year: Never true    Ran Out of Food in the Last Year: Never true  Transportation Needs: No Transportation Needs (12/03/2017)   PRAPARE - Administrator, Civil Service (Medical): No    Lack of Transportation (Non-Medical): No   Physical Activity: Inactive (12/03/2017)   Exercise Vital Sign    Days of Exercise per Week: 0 days    Minutes of Exercise per Session: 0 min  Stress: No Stress Concern Present (12/03/2017)   [employer] of Occupational Health - Occupational Stress Questionnaire    Feeling of Stress : Only a little  Social Connections: Unknown (12/03/2017)   Social Connection and Isolation Panel [NHANES]    Frequency of Communication with Friends and Family: Patient declined    Frequency of Social Gatherings with Friends and Family: Patient declined    Attends Religious Services: Patient declined    Database administrator or Organizations: Patient declined    Attends Banker  Meetings: Patient declined    Marital Status: Patient declined  Intimate Partner Violence: Unknown (12/03/2017)   Humiliation, Afraid, Rape, and Kick questionnaire    Fear of Current or Ex-Partner: Patient declined    Emotionally Abused: Patient declined    Physically Abused: Patient declined    Sexually Abused: Patient declined    FAMILY HISTORY: Family History  Problem Relation Age of Onset   Hypertension Mother    Depression Mother    Heart failure Father    Obesity Brother    Heart disease Brother    Stroke Brother    Depression Brother    Breast cancer Cousin    Breast cancer Cousin    Breast cancer Cousin     ALLERGIES:  is allergic to sulfamethoxazole, other, statins, trazodone, ciprofloxacin, and sulfa antibiotics.  MEDICATIONS:  Current Outpatient Medications  Medication Sig Dispense Refill   Calcium Carb-Cholecalciferol 600-400 MG-UNIT TABS 1200 + 1000 D once daily     calcium carbonate 1250 MG capsule Take 1,250 mg by mouth daily.      Cholecalciferol (VITAMIN D3) 1000 units CAPS Take 2,000 Units by mouth 2 (two) times daily.      denosumab (PROLIA) 60 MG/ML SOLN injection Inject 60 mg into the skin every 6 (six) months. Administer in upper arm, thigh, or abdomen     ibuprofen (ADVIL) 400 MG  tablet Take 400 mg by mouth every 4 (four) hours as needed.     lisinopril (ZESTRIL) 40 MG tablet Take 40 mg by mouth daily.     loperamide (IMODIUM) 2 MG capsule Take 2 mg by mouth as needed for diarrhea or loose stools.      Magnesium 250 MG TABS Take 500 mg by mouth daily.      metoprolol succinate (TOPROL-XL) 25 MG 24 hr tablet Take 1 tablet by mouth daily.     omeprazole (PRILOSEC) 40 MG capsule Take by mouth.     Turmeric Curcumin 500 MG CAPS Take 1,500 mg by mouth daily.      acetaminophen (TYLENOL) 500 MG tablet Take 650 mg by mouth every 6 (six) hours as needed.     Ascorbic Acid (VITAMIN C) 100 MG tablet Take 100 mg by mouth daily.     baclofen (LIORESAL) 10 MG tablet Take 10 mg by mouth 3 (three) times daily.     buPROPion (WELLBUTRIN XL) 300 MG 24 hr tablet Take by mouth.     busPIRone (BUSPAR) 10 MG tablet Take 10 mg by mouth 3 (three) times daily.     busPIRone (BUSPAR) 10 MG tablet Take 1 tablet by mouth 2 (two) times daily.     casirivimab-imdevimab (REGEN-COV) 600-600 MG/10ML injection REGEN-COV (EUA) 60 mg-60 mg/mL intravenous solution  Administer four (4), 2.5 ml subcutaneous injections consecutively, each at different injection sites, into the though, back of upper arm, or abdomen.     exemestane (AROMASIN) 25 MG tablet TAKE 1 TABLET DAILY AFTER  BREAKFAST (Patient not taking: Reported on 06/05/2022) 90 tablet 3   gabapentin (NEURONTIN) 100 MG capsule Take by mouth.     meloxicam (MOBIC) 7.5 MG tablet Take by mouth.     tiZANidine (ZANAFLEX) 2 MG tablet Take 1 tablet (2 mg total) by mouth every 8 (eight) hours as needed for muscle spasms. Sedation precaution 30 tablet 0   vitamin B-12 (CYANOCOBALAMIN) 500 MCG tablet Take 1,000 mcg by mouth every other day.      No current facility-administered medications for this visit.      Marland Kitchen  PHYSICAL EXAMINATION: ECOG PERFORMANCE STATUS: 0 - Asymptomatic  Vitals:   09/19/22 1446 09/19/22 1459  BP: (!) 175/78 (!) 141/71  Pulse:  78   Temp: 98.4 F (36.9 C)   SpO2: 97%    Filed Weights   09/19/22 1446  Weight: 179 lb 3.2 oz (81.3 kg)    Physical Exam Vitals and nursing note reviewed.  Constitutional:      Comments: Alone.      HENT:     Head: Normocephalic and atraumatic.     Mouth/Throat:     Pharynx: Oropharynx is clear.  Eyes:     Extraocular Movements: Extraocular movements intact.     Pupils: Pupils are equal, round, and reactive to light.  Cardiovascular:     Rate and Rhythm: Normal rate and regular rhythm.  Pulmonary:     Comments: Decreased breath sounds bilaterally.  Abdominal:     Palpations: Abdomen is soft.  Musculoskeletal:        General: Normal range of motion.     Cervical back: Normal range of motion.  Skin:    General: Skin is warm.  Neurological:     General: No focal deficit present.     Mental Status: She is alert and oriented to person, place, and time.  Psychiatric:        Behavior: Behavior normal.        Judgment: Judgment normal.      LABORATORY DATA:  I have reviewed the data as listed Lab Results  Component Value Date   WBC 6.1 06/05/2022   HGB 11.1 (L) 06/05/2022   HCT 34.3 (L) 06/05/2022   MCV 93.5 06/05/2022   PLT 264 06/05/2022   Recent Labs    12/05/21 1500 01/09/22 0935 06/05/22 1422  NA 141 139 137  K 3.8 4.4 3.7  CL 105 106 104  CO2 27 25 24   GLUCOSE 118* 136* 104*  BUN 19 18 26*  CREATININE 1.06* 1.35* 1.01*  CALCIUM 10.0 9.8 9.4  GFRNONAA 52* 39* 55*  PROT 6.9  --  7.3  ALBUMIN 4.6  --  4.7  AST 32  --  26  ALT 24  --  19  ALKPHOS 41  --  44  BILITOT 0.6  --  0.5    RADIOGRAPHIC STUDIES: I have personally reviewed the radiological images as listed and agreed with the findings in the report. MM 3D SCREENING MAMMOGRAM BILATERAL BREAST  Result Date: 09/13/2022 CLINICAL DATA:  Screening. EXAM: DIGITAL SCREENING BILATERAL MAMMOGRAM WITH TOMOSYNTHESIS AND CAD TECHNIQUE: Bilateral screening digital craniocaudal and mediolateral  oblique mammograms were obtained. Bilateral screening digital breast tomosynthesis was performed. The images were evaluated with computer-aided detection. COMPARISON:  Previous exam(s). ACR Breast Density Category b: There are scattered areas of fibroglandular density. FINDINGS: There are no findings suspicious for malignancy. IMPRESSION: No mammographic evidence of malignancy. A result letter of this screening mammogram will be mailed directly to the patient. RECOMMENDATION: Screening mammogram in one year. (Code:SM-B-01Y) BI-RADS CATEGORY  1: Negative. Electronically Signed   By: Baird Lyons M.D.   On: 09/13/2022 08:41    ASSESSMENT & PLAN:   Carcinoma of upper-outer quadrant of right breast in female, estrogen receptor positive (HCC) # JULY 2018- Multifocal RIGHT breast cancer ER Positive-stage I-high risk MammaPrint; no chemotherapy; second opinion at [facility];Dr.Marcom].  OFF Aromasin- sec to MSK pain [Holding since march 2024]  # Clinically no evidence of recurrence.  Mammogram June 2024- WNL. I again reviewed the benefit of extended AI-  however, given the on going issues regarding musculoskeletal pain [unlikely sec to Aromasin] see below re: hair loss. wants to hold off Aromasin for 3 more months; and then re-start at next visit  # Hair loss-  sec to Aromasin wants to hold off for 3 more months; and then re-start at next visit.   # OCT 2023- CT scan CAP- Incidental Hyperdense 1.1 cm posterior interpolar left renal cortical lesion, indeterminate. Recommend further characterization with renal mass protocol MRI (preferred) or CT abdomen without and with IV contrast on a short term outpatient basis. Would want to wait/ wants to bring to PCP attention.   # OCT 2023- CT scan CAP- Incidental Simple 5.4 cm right adnexal cyst. Recommend short-term outpatient follow-up with pelvic ultrasound. Patient wants to defer to PCP for work up.   # MSK/Hx of OA-[s/p knee steroids; Dr.Poggi]-grade 1-2; chronic joint  pains/arthritis-even before patient started Aromasin.  Monitor for now.  #  Osteoporosis:The BMD [July 2021] T-score of -2.8. Prolia today; AUG 2023- BMD-T-score= -.2.8. continue with prolia. Stable.    # Anemia- mild Hb 11.3; recommend PO Iron gentle iron-poor tolerance/dairrhea. 2019- last colo- Discussed regarding adding these foods. stable.  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  # Bilateral lower extremity swelling-clinically not suspicious of DVT/PE.  Suspect dependent edema/venous stasis changes; recommend elevation.   stable.  *mychart # DISPOSITION: # Follow up in  3  months for MD; labs- [facility] with diff, CMP, CA27.29], and Prolia- Dr.B     All questions were answered. The patient knows to call the clinic with any problems, questions or concerns.    Earna Coder, MD 09/19/2022 3:28 PM

## 2022-09-19 NOTE — Assessment & Plan Note (Signed)
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  OFF Aromasin- sec to MSK pain [Holding since march 2024]  # Clinically no evidence of recurrence.  Mammogram June 2024- WNL. I again reviewed the benefit of extended AI- however, given the on going issues regarding musculoskeletal pain [unlikely sec to Aromasin] see below re: hair loss. wants to hold off Aromasin for 3 more months; and then re-start at next visit  # Hair loss-  sec to Aromasin wants to hold off for 3 more months; and then re-start at next visit.   # OCT 2023- CT scan CAP- Incidental Hyperdense 1.1 cm posterior interpolar left renal cortical lesion, indeterminate. Recommend further characterization with renal mass protocol MRI (preferred) or CT abdomen without and with IV contrast on a short term outpatient basis. Would want to wait/ wants to bring to PCP attention.   # OCT 2023- CT scan CAP- Incidental Simple 5.4 cm right adnexal cyst. Recommend short-term outpatient follow-up with pelvic ultrasound. Patient wants to defer to PCP for work up.   # MSK/Hx of OA-[s/p knee steroids; Dr.Poggi]-grade 1-2; chronic joint pains/arthritis-even before patient started Aromasin.  Monitor for now.  #  Osteoporosis:The BMD [July 2021] T-score of -2.8. Prolia today; AUG 2023- BMD-T-score= -.2.8. continue with prolia. Stable.    # Anemia- mild Hb 11.3; recommend PO Iron gentle iron-poor tolerance/dairrhea. 2019- last colo- Discussed regarding adding these foods. stable.  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  # Bilateral lower extremity swelling-clinically not suspicious of DVT/PE.  Suspect dependent edema/venous stasis changes; recommend elevation.   stable.  *mychart # DISPOSITION: # Follow up in  3  months for MD; labs- [facility] with diff, CMP, CA27.29], and Prolia- Dr.B

## 2022-09-21 ENCOUNTER — Ambulatory Visit
Admission: RE | Admit: 2022-09-21 | Discharge: 2022-09-21 | Disposition: A | Discharge status: Home / Self Care | Payer: Medicare Other | Source: Ambulatory Visit | Attending: Surgery | Admitting: Surgery

## 2022-09-21 DIAGNOSIS — S76011D Strain of muscle, fascia and tendon of right hip, subsequent encounter: Secondary | ICD-10-CM

## 2022-10-09 ENCOUNTER — Other Ambulatory Visit: Payer: Self-pay | Admitting: Physical Medicine & Rehabilitation

## 2022-10-09 DIAGNOSIS — G8929 Other chronic pain: Secondary | ICD-10-CM

## 2022-10-09 DIAGNOSIS — R2681 Unsteadiness on feet: Secondary | ICD-10-CM

## 2022-10-25 ENCOUNTER — Ambulatory Visit
Admission: RE | Admit: 2022-10-25 | Discharge: 2022-10-25 | Disposition: A | Discharge status: Home / Self Care | Payer: Medicare Other | Source: Ambulatory Visit | Attending: Physical Medicine & Rehabilitation | Admitting: Physical Medicine & Rehabilitation

## 2022-10-25 DIAGNOSIS — G8929 Other chronic pain: Secondary | ICD-10-CM

## 2022-10-25 DIAGNOSIS — R2681 Unsteadiness on feet: Secondary | ICD-10-CM

## 2022-11-03 ENCOUNTER — Other Ambulatory Visit: Payer: Self-pay | Admitting: Physical Medicine & Rehabilitation

## 2022-11-03 DIAGNOSIS — N2889 Other specified disorders of kidney and ureter: Secondary | ICD-10-CM

## 2022-11-20 ENCOUNTER — Encounter: Payer: Self-pay | Admitting: Physical Medicine & Rehabilitation

## 2022-11-29 ENCOUNTER — Other Ambulatory Visit: Payer: Medicare Other

## 2022-12-06 ENCOUNTER — Ambulatory Visit: Payer: Medicare Other | Admitting: Internal Medicine

## 2022-12-06 ENCOUNTER — Ambulatory Visit: Payer: Medicare Other

## 2022-12-06 ENCOUNTER — Other Ambulatory Visit: Payer: Medicare Other

## 2022-12-20 ENCOUNTER — Inpatient Hospital Stay: Payer: Medicare Other | Admitting: Internal Medicine

## 2022-12-20 ENCOUNTER — Inpatient Hospital Stay: Payer: Medicare Other

## 2022-12-20 ENCOUNTER — Telehealth: Payer: Self-pay | Admitting: Internal Medicine

## 2022-12-20 NOTE — Telephone Encounter (Signed)
Pt r/s appt from today due to not feeling well. Pt wants to know if she can do her labs at the [facility] hospital next month?   Please call her to let her know

## 2023-01-26 ENCOUNTER — Other Ambulatory Visit: Payer: Self-pay | Admitting: Internal Medicine

## 2023-01-30 ENCOUNTER — Encounter: Payer: Self-pay | Admitting: Internal Medicine

## 2023-02-02 ENCOUNTER — Encounter: Payer: Self-pay | Admitting: Emergency Medicine

## 2023-02-02 ENCOUNTER — Ambulatory Visit: Payer: Medicare Other

## 2023-02-02 ENCOUNTER — Ambulatory Visit
Admission: EM | Admit: 2023-02-02 | Discharge: 2023-02-02 | Disposition: A | Discharge status: Home / Self Care | Payer: Medicare Other | Attending: Emergency Medicine | Admitting: Emergency Medicine

## 2023-02-02 DIAGNOSIS — S92345A Nondisplaced fracture of fourth metatarsal bone, left foot, initial encounter for closed fracture: Secondary | ICD-10-CM

## 2023-02-02 DIAGNOSIS — M79672 Pain in left foot: Secondary | ICD-10-CM | POA: Diagnosis not present

## 2023-02-02 DIAGNOSIS — R030 Elevated blood-pressure reading, without diagnosis of hypertension: Secondary | ICD-10-CM | POA: Diagnosis not present

## 2023-02-02 DIAGNOSIS — Z8679 Personal history of other diseases of the circulatory system: Secondary | ICD-10-CM | POA: Diagnosis not present

## 2023-02-02 NOTE — Discharge Instructions (Signed)
It appears you have a fourth metatarsal fracture(wet read by this provider, no official radiology read at present), check my chart for results. Rest,ice,elevate,may use salon pas or lidocaine patch to area for pain management. May take over the counter tylenol as label directed for pain. Call your orthopedist for follow up or [facility]. [facility]: [address] [place], [place] [address] Phone: [phone] Urgent care hours 8a-7:30p Mon-Sat

## 2023-02-02 NOTE — ED Triage Notes (Signed)
Patient c/o throbbing pain on the top of her left foot that started last night.  Patient denies injury or fall.

## 2023-02-02 NOTE — ED Provider Notes (Signed)
Allison Hall    CSN: [id number] Arrival date & time: 02/02/23  1054      History   Chief Complaint Chief Complaint  Patient presents with   Foot Pain    left    HPI Allison Hall is a 82 y.o. female.   82 year old female, Allison Hall, presents to urgent care for evaluation of throbbing pain on top of her left foot that started last night.  Patient denies new injury trauma or fall.  She states she has a history of stress fracture of same foot and would like to have it x-rayed to rule out fracture, patient uses a walker for balance.   She has been treating the area with salon pas patch, Tylenol.  The history is provided by the patient. No language interpreter was used.    Past Medical History:  Diagnosis Date   Anemia    in past   Anxiety    Breast cancer (HCC) 08/24/2016   right breast UOQ   Cancer (HCC)    skin ca   Colon polyp    COVID-19 02/14/2020   received treatment   Depression    GERD (gastroesophageal reflux disease)    Hypercholesteremia    Hypertension    Osteoarthritis    Personal history of radiation therapy 2018   RIGHT BREAST CA UOQ   Scoliosis of lumbar spine    Thyroid nodule 04/17/2016    Patient Active Problem List   Diagnosis Date Noted   Foot pain, left 02/02/2023   Nondisplaced fracture of fourth metatarsal bone, left foot, initial encounter for closed fracture 02/02/2023   Elevated blood pressure reading 02/02/2023   History of hypertension 02/02/2023   Hair thinning 05/04/2020   Chronic pain of left knee 04/04/2018   Chronic pain syndrome 04/04/2018   Iron deficiency anemia 10/31/2017   B12 deficiency 10/31/2017   Arthralgia 06/03/2017   Osteoporosis 10/04/2016   Carcinoma of upper-outer quadrant of right breast in female, estrogen receptor positive (HCC) 08/24/2016   Closed fracture of distal end of ulna 06/01/2015   Triggering of digit 05/27/2014   Hamstring muscle strain 05/26/2014   Arthritis of  knee, degenerative 03/26/2014   Long term current use of opiate analgesic 04/21/2013   Arthritis, degenerative 04/21/2013   Anxiety 10/05/2011   Colon polyp 10/05/2011   Esophageal foreign body 10/05/2011   Acid reflux 10/05/2011   BP (high blood pressure) 10/05/2011   Hypercholesterolemia 10/05/2011    Past Surgical History:  Procedure Laterality Date   ABDOMINAL HYSTERECTOMY     APPENDECTOMY     BREAST BIOPSY Right 08/24/2016   9:30 - invasive mammary carcinoma with lobular features   BREAST BIOPSY Right 08/24/2016   10:00 - invasive mammary carcinoma with lobular features   BREAST LUMPECTOMY Right 10/09/2016   INVASIVE CARCINOMA, TUBULO-LOBULAR VARIANT, CLEAR MARGINS, NEGATIVE LN'S   CATARACT EXTRACTION W/PHACO Left 04/06/2020   Procedure: CATARACT EXTRACTION PHACO AND INTRAOCULAR LENS PLACEMENT (IOC) LEFT;  Surgeon: Galen Manila, MD;  Location: [facility];  Service: Ophthalmology;  Laterality: Left;  5.49 0:33.9   CATARACT EXTRACTION W/PHACO Right 04/20/2020   Procedure: CATARACT EXTRACTION PHACO AND INTRAOCULAR LENS PLACEMENT (IOC) RIGHT;  Surgeon: Galen Manila, MD;  Location: [facility];  Service: Ophthalmology;  Laterality: Right;  7.26 0:47.2   COLONOSCOPY W/ POLYPECTOMY  2003, 2008, 2014   COLONOSCOPY WITH PROPOFOL N/A 12/03/2017   Procedure: COLONOSCOPY WITH PROPOFOL;  Surgeon: Scot Jun, MD;  Location: [facility];  Service: Endoscopy;  Laterality: N/A;   ESOPHAGOGASTRODUODENOSCOPY     foreign body   ESOPHAGOGASTRODUODENOSCOPY (EGD) WITH PROPOFOL N/A 12/03/2017   Procedure: ESOPHAGOGASTRODUODENOSCOPY (EGD) WITH PROPOFOL;  Surgeon: Scot Jun, MD;  Location: [facility];  Service: Endoscopy;  Laterality: N/A;   JOINT REPLACEMENT Right 2008   TKR   NOSE SURGERY     PARTIAL MASTECTOMY WITH NEEDLE LOCALIZATION Right 09/29/2016   Procedure: PARTIAL MASTECTOMY WITH NEEDLE LOCALIZATION;  Surgeon: Nadeen Landau, MD;  Location:  [facility];  Service: General;  Laterality: Right;   REPLACEMENT TOTAL KNEE Right 2008   SENTINEL NODE BIOPSY Right 09/29/2016   Procedure: SENTINEL NODE BIOPSY;  Surgeon: Nadeen Landau, MD;  Location: [facility];  Service: General;  Laterality: Right;    OB History   No obstetric history on file.      Home Medications    Prior to Admission medications   Medication Sig Start Date End Date Taking? Authorizing Provider  acetaminophen (TYLENOL) 500 MG tablet Take 650 mg by mouth every 6 (six) hours as needed.    [provider]  Ascorbic Acid (VITAMIN C) 100 MG tablet Take 100 mg by mouth daily.    [provider]  baclofen (LIORESAL) 10 MG tablet Take 10 mg by mouth 3 (three) times daily. 11/14/21   [provider]  buPROPion (WELLBUTRIN XL) 300 MG 24 hr tablet Take by mouth. 07/15/20   [provider]  busPIRone (BUSPAR) 10 MG tablet Take 10 mg by mouth 3 (three) times daily. 04/17/16   [provider]  busPIRone (BUSPAR) 10 MG tablet Take 1 tablet by mouth 2 (two) times daily. 07/15/20   [provider]  Calcium Carb-Cholecalciferol 600-400 MG-UNIT TABS 1200 + 1000 D once daily    [provider]  calcium carbonate 1250 MG capsule Take 1,250 mg by mouth daily.     [provider]  casirivimab-imdevimab (REGEN-COV) 600-600 MG/10ML injection REGEN-COV (EUA) 60 mg-60 mg/mL intravenous solution  Administer four (4), 2.5 ml subcutaneous injections consecutively, each at different injection sites, into the though, back of upper arm, or abdomen.    [provider]  Cholecalciferol (VITAMIN D3) 1000 units CAPS Take 2,000 Units by mouth 2 (two) times daily.     [provider]  denosumab (PROLIA) 60 MG/ML SOLN injection Inject 60 mg into the skin every 6 (six) months. Administer in upper arm, thigh, or abdomen    [provider]  exemestane (AROMASIN) 25 MG tablet TAKE 1 TABLET DAILY AFTER   BREAKFAST Patient not taking: Reported on 06/05/2022 01/09/22   Earna Coder, MD  gabapentin (NEURONTIN) 100 MG capsule Take by mouth. 11/24/21   [provider]  ibuprofen (ADVIL) 400 MG tablet Take 400 mg by mouth every 4 (four) hours as needed. 02/28/22   [provider]  lisinopril (ZESTRIL) 40 MG tablet Take 40 mg by mouth daily. 11/24/18   [provider]  loperamide (IMODIUM) 2 MG capsule Take 2 mg by mouth as needed for diarrhea or loose stools.     [provider]  Magnesium 250 MG TABS Take 500 mg by mouth daily.     [provider]  meloxicam (MOBIC) 7.5 MG tablet Take by mouth. 11/16/21   [provider]  metoprolol succinate (TOPROL-XL) 25 MG 24 hr tablet Take 1 tablet by mouth daily. 07/31/22 07/31/23  [provider]  omeprazole (PRILOSEC) 40 MG capsule Take by mouth. 08/04/20   [provider]  tiZANidine (ZANAFLEX) 2 MG tablet Take 1 tablet (2 mg total) by mouth every 8 (eight) hours as needed for muscle spasms. Sedation precaution 10/30/21   Guy Sandifer L, PA  Turmeric Curcumin 500 MG CAPS Take 1,500 mg by mouth daily.     [provider]  vitamin B-12 (CYANOCOBALAMIN) 500 MCG tablet Take 1,000 mcg by mouth every other day.     [provider]    Family History Family History  Problem Relation Age of Onset   Hypertension Mother    Depression Mother    Heart failure Father    Obesity Brother    Heart disease Brother    Stroke Brother    Depression Brother    Breast cancer Cousin    Breast cancer Cousin    Breast cancer Cousin     Social History Social History   Tobacco Use   Smoking status: Former    Current packs/day: 0.00    Average packs/day: 1 pack/day for 10.0 years (10.0 ttl pk-yrs)    Types: Cigarettes    Start date: 05/19/1955    Quit date: 05/18/1965    Years since quitting: 57.7   Smokeless tobacco: Never  Vaping Use   Vaping status: Never Used  Substance Use  Topics   Alcohol use: Not Currently    Alcohol/week: 7.0 standard drinks of alcohol    Types: 7 Glasses of wine per week   Drug use: No     Allergies   Sulfamethoxazole, Other, Statins, Trazodone, Ciprofloxacin, and Sulfa antibiotics   Review of Systems Review of Systems  Constitutional:  Negative for fever.  Musculoskeletal:  Positive for arthralgias and myalgias. Negative for gait problem.  Skin:  Negative for color change, rash and wound.  All other systems reviewed and are negative.    Physical Exam Triage Vital Signs ED Triage Vitals  Encounter Vitals Group     BP 02/02/23 1113 (!) 142/92     Systolic BP Percentile --      Diastolic BP Percentile --      Pulse Rate 02/02/23 1113 73     Resp 02/02/23 1113 14     Temp 02/02/23 1113 98.5 F (36.9 C)     Temp Source 02/02/23 1113 Oral     SpO2 02/02/23 1113 97 %     Weight 02/02/23 1112 179 lb 3.7 oz (81.3 kg)     Height 02/02/23 1112 5\' 4"  (1.626 m)     Head Circumference --      Peak Flow --      Pain Score 02/02/23 1112 0     Pain Loc --      Pain Education --      Exclude from Growth Chart --    No data found.  Updated Vital Signs BP (!) 142/92 (BP Location: Left Arm)   Pulse 73   Temp 98.5 F (36.9 C) (Oral)   Resp 14   Ht 5\' 4"  (1.626 m)   Wt 179 lb 3.7 oz (81.3 kg)   SpO2 97%   BMI 30.77 kg/m   Visual Acuity Right Eye Distance:   Left Eye Distance:   Bilateral Distance:    Right Eye Near:   Left Eye Near:    Bilateral Near:     Physical Exam Vitals and nursing note reviewed.  Musculoskeletal:     Left foot: Bony tenderness present.       Legs:  Neurological:     General: No focal deficit present.  Mental Status: She is alert and oriented to person, place, and time.     GCS: GCS eye subscore is 4. GCS verbal subscore is 5. GCS motor subscore is 6.     Cranial Nerves: No cranial nerve deficit.     Sensory: No sensory deficit.  Psychiatric:        Attention and Perception:  Attention normal.        Mood and Affect: Mood normal.        Speech: Speech normal.        Behavior: Behavior normal.      UC Treatments / Results  Labs (all labs ordered are listed, but only abnormal results are displayed) Labs Reviewed - No data to display  EKG   Radiology DG Foot Complete Left  Result Date: 02/02/2023 CLINICAL DATA:  Acute left foot pain. EXAM: LEFT FOOT - COMPLETE 3+ VIEW COMPARISON:  June 10, 2019. FINDINGS: There is no evidence of fracture or dislocation. Mild degenerative changes seen involving first metatarsophalangeal joint. Mild posterior calcaneal spurring is noted. Soft tissues are unremarkable. IMPRESSION: No acute abnormality seen. Electronically Signed   By: Lupita Raider M.D.   On: 02/02/2023 14:46    Procedures Procedures (including critical care time)  Medications Ordered in UC Medications - No data to display  Initial Impression / Assessment and Plan / UC Course  I have reviewed the triage vital signs and the nursing notes.  Pertinent labs & imaging results that were available during my care of the patient were reviewed by me and considered in my medical decision making (see chart for details).  Clinical Course as of 02/02/23 1813  Fri Feb 02, 2023  1145 Left foot xray, pt has hx of stress fx,pt worried she has another foot fx, no known injury, +pain [JD]    Clinical Course User Index [JD] Nazia Rhines, Para March, NP   Discussed exam findings and plan of care with patient, strict go to ER precautions given.   Patient verbalized understanding to this provider.  Will follow-up with her orthopedist, will elevate, rest, ice, use Tylenol for pain. No cam walker boot given as pt is using walker concern for injury.  Ddx: Left foot pain, nondisplaced fracture fourth metatarsal bone(left), contusion, history of hypertension, elevated blood pressure reading Final Clinical Impressions(s) / UC Diagnoses   Final diagnoses:  Foot pain, left   Nondisplaced fracture of fourth metatarsal bone, left foot, initial encounter for closed fracture  Elevated blood pressure reading  History of hypertension     Discharge Instructions      It appears you have a fourth metatarsal fracture(wet read by this provider, no official radiology read at present), check my chart for results. Rest,ice,elevate,may use salon pas or lidocaine patch to area for pain management. May take over the counter tylenol as label directed for pain. Call your orthopedist for follow up or [facility]. [facility]: [address] [place], [place] [address] Phone: [phone] Urgent care hours 8a-7:30p Mon-Sat    ED Prescriptions   None    PDMP not reviewed this encounter.   Clancy Gourd, NP 02/02/23 [id number]

## 2023-02-05 ENCOUNTER — Inpatient Hospital Stay: Payer: Medicare Other

## 2023-02-05 ENCOUNTER — Inpatient Hospital Stay (HOSPITAL_BASED_OUTPATIENT_CLINIC_OR_DEPARTMENT_OTHER): Payer: Medicare Other | Admitting: Internal Medicine

## 2023-02-05 ENCOUNTER — Encounter: Payer: Self-pay | Admitting: Internal Medicine

## 2023-02-05 ENCOUNTER — Inpatient Hospital Stay: Payer: Medicare Other | Attending: Internal Medicine

## 2023-02-05 DIAGNOSIS — Z170 Estrogen receptor positive status [ER+]: Secondary | ICD-10-CM | POA: Insufficient documentation

## 2023-02-05 DIAGNOSIS — C50411 Malignant neoplasm of upper-outer quadrant of right female breast: Secondary | ICD-10-CM | POA: Diagnosis not present

## 2023-02-05 DIAGNOSIS — M810 Age-related osteoporosis without current pathological fracture: Secondary | ICD-10-CM | POA: Diagnosis present

## 2023-02-05 DIAGNOSIS — E538 Deficiency of other specified B group vitamins: Secondary | ICD-10-CM | POA: Diagnosis not present

## 2023-02-05 DIAGNOSIS — Z87891 Personal history of nicotine dependence: Secondary | ICD-10-CM | POA: Insufficient documentation

## 2023-02-05 LAB — CMP (CANCER CENTER ONLY)
ALT: 16 U/L (ref 0–44)
AST: 19 U/L (ref 15–41)
Albumin: 4.4 g/dL (ref 3.5–5.0)
Alkaline Phosphatase: 47 U/L (ref 38–126)
Anion gap: 10 (ref 5–15)
BUN: 27 mg/dL — ABNORMAL HIGH (ref 8–23)
CO2: 26 mmol/L (ref 22–32)
Calcium: 10 mg/dL (ref 8.9–10.3)
Chloride: 102 mmol/L (ref 98–111)
Creatinine: 0.9 mg/dL (ref 0.44–1.00)
GFR, Estimated: >60 mL/min (ref >60)
Glucose, Bld: 93 mg/dL (ref 70–99)
Potassium: 3.9 mmol/L (ref 3.5–5.1)
Sodium: 138 mmol/L (ref 135–145)
Total Bilirubin: 0.4 mg/dL (ref <1.2)
Total Protein: 6.8 g/dL (ref 6.5–8.1)

## 2023-02-05 LAB — CBC WITH DIFFERENTIAL (CANCER CENTER ONLY)
Abs Immature Granulocytes: 0.03 10*3/uL (ref 0.00–0.07)
Basophils Absolute: 0.1 10*3/uL (ref 0.0–0.1)
Basophils Relative: 1 %
Eosinophils Absolute: 0.1 10*3/uL (ref 0.0–0.5)
Eosinophils Relative: 2 %
HCT: 34.6 % — ABNORMAL LOW (ref 36.0–46.0)
Hemoglobin: 11.5 g/dL — ABNORMAL LOW (ref 12.0–15.0)
Immature Granulocytes: 1 %
Lymphocytes Relative: 20 %
Lymphs Abs: 1.3 10*3/uL (ref 0.7–4.0)
MCH: 30.3 pg (ref 26.0–34.0)
MCHC: 33.2 g/dL (ref 30.0–36.0)
MCV: 91.3 fL (ref 80.0–100.0)
Monocytes Absolute: 0.7 10*3/uL (ref 0.1–1.0)
Monocytes Relative: 11 %
Neutro Abs: 4.4 10*3/uL (ref 1.7–7.7)
Neutrophils Relative %: 65 %
Platelet Count: 223 10*3/uL (ref 150–400)
RBC: 3.79 MIL/uL — ABNORMAL LOW (ref 3.87–5.11)
RDW: 13.5 % (ref 11.5–15.5)
WBC Count: 6.6 10*3/uL (ref 4.0–10.5)
nRBC: 0 % (ref 0.0–0.2)

## 2023-02-05 MED ORDER — DENOSUMAB 60 MG/ML ~~LOC~~ SOSY
60.0000 mg | PREFILLED_SYRINGE | Freq: Once | SUBCUTANEOUS | Status: AC
  Administered 2023-02-05: 60 mg via SUBCUTANEOUS
  Filled 2023-02-05: qty 1

## 2023-02-05 NOTE — Progress Notes (Signed)
[facility] CONSULT NOTE  Patient Care Team: Lorrine Kin, MD as PCP - General (Family Medicine) Nadeen Landau, MD (Inactive) as Referring Physician (Surgery) Earna Coder, MD as Consulting Physician (Oncology)  CHIEF COMPLAINTS/PURPOSE OF CONSULTATION: Breast cancer  #  Oncology History Overview Note        Assessment:  Allison Hall is a 82 y.o. female with multi-focal right breast cancer s/p partial mastectomy with sentinel lymph node biopsy on 09/29/2016.  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  Three sentinel lymph nodes were negative.  Tumor was ER + (> 90%), PR + (> 90%) and Her2/neu 2+ (negative by FISH).  Pathologic stage was T1c(m) N0(sn).  CA27.29 was 13.4 on 09/06/2016.   MammaPrint revealed a 29% risk of recurrence (high).  She was seen for second opinion at [facility] on 10/25/2016.  Chemotherapy was not recommended.   Bilateral screening mammogram on 07/24/2016 revealed a possible area of distortion in the right breast. Diagnostic mammogram on 08/10/2016 revealed 2 suspicious masses in the upper-outer quadrant of the right breast in a broad area of confluent distortion in the upper-outer quadrant of the right breast.  There are at least 3 areas of confluent distortion spanning approximately 6.5 cm of tissue.  Ultrasound on 07/31/2016 revealed  an irregular hypoechoic 1.2 x 1.1 x 1.2 cm at the 9:30 position 7 cm from the nipple.  In addition, there was an irregular 1.9 x 0.8 x 1.2 cm mass at the 10 o'clock position 5 cm from the nipple.  There were multiple small normal-appearing lymph nodes.   Breast MRI on 09/16/2016 revealed an irregular 1.6 x 1.2 x 1.6 cm enhancing mass adjacent to the clip artifact within the upper, outer right breast, middle depth, corresponding to the mass seen at 10 o'clock. There was an additional irregular  enhancing mass adjacent to the more posterior clip artifact measuring 2.6 x 1.4 x 1.6 cm, corresponding  to the mass seen at 9:30 position. Together, these 2 masses span 6.3 cm of breast tissue from anterior to posterior. There was no definite additional suspicious area of enhancement within the right breast to correspond to the third area of distortion identified on diagnostic mammogram dated 08/10/2016.  Left breast revealed no suspicious mass or enhancement.   Bilateral diagnostic mammogram on 09/03/2019 revealed no evidence of malignancy.   She received radiation from 11/15/2016 - 01/08/2017.  She was Femara (01/17/2017 - 05/16/2017) secondary to arthralgias.  She discontinued Arimidex after 19 days (last 06/14/2017) secondary to fatigue and polyarthralgias.  She began Aromasin on 06/27/2017. STOPPED AROMASIN MARCH 2024 [arthralgias hair loss]   CA27.29 has been followed:  13.4 on 09/06/2016, 13.0 on 05/16/2017, 17.1 on 08/29/2017, 12.1 on 06/04/2018, 13.8 on 10/01/2018, and 15.6 on 02/04/2019.   Bone density study on 09/30/2015 revealed osteopenia with a T-score of -2.1 in the right femoral neck.  Bone density study revealed osteoporosis on 10/02/2017 with a T-score of -3.1. She began Prolia on 02/14/2017 (last 10/09/2019).   She was diagnosed with iron deficiency anemia and B12 deficiency on 08/29/2017.  TSH and folate were normal.  She is on oral B12 500 mcg a day.  Intrinsic factor and anti-parietal cell antibodies were normal on 01/02/2018.  B12 was 888 on 01/29/2018 and 544 on 02/04/2019.   She received Venofer weekly x 3 (10/31/2017 - 11/13/2017) and x 4 (06/05/2018 - 06/26/2018).   Ferritin  has been followed: 10 on 08/29/2017, 174 on 12/12/2017, 74 on 01/02/2018, 64 on 01/29/2018, 3 on 06/04/2018, 205 on 10/01/2018, and 162 on 02/04/2019.   EGD and colonoscopy on 12/03/2017. Polyps were noted. Pathology was negative for dysplasia and malignancy.    She tested positive for COVID-19 on  02/14/2020.   Symptomatically, she is doing well.  She denies any breast concerns.  She has some hair thinning.  Exam reveals no evidence of recurrent disease.   Pl   Carcinoma of upper-outer quadrant of right breast in female, estrogen receptor positive (HCC)  08/24/2016 Initial Diagnosis   Carcinoma of upper-outer quadrant of breast in female, estrogen receptor positive (HCC)     HISTORY OF PRESENTING ILLNESS: Alone.  Ambulating independently.  Allison Hall 82 y.o.  female patient with stage I multifocal ER/PR positive HER2 negative breast cancer currently OFF adjuvant Aromasin [on Extended; Mammaprint-high risk]- is here for a follow up.  Patient noted to have improvement of her hair loss-since stopping Aromasin. However she continues to have worsening hip pain/knee pain not any better.   Patient continues to have other chronic joint pains not any worse.Otherwise denies any nausea vomiting headaches.  Chronic mild intermittent swelling in the legs.  Review of Systems  Constitutional:  Positive for malaise/fatigue. Negative for chills, diaphoresis, fever and weight loss.  HENT:  Negative for nosebleeds and sore throat.   Eyes:  Negative for double vision.  Respiratory:  Negative for cough, hemoptysis, sputum production, shortness of breath and wheezing.   Cardiovascular:  Negative for chest pain, palpitations, orthopnea and leg swelling.  Gastrointestinal:  Negative for abdominal pain, blood in stool, constipation, diarrhea, heartburn, melena, nausea and vomiting.  Genitourinary:  Negative for dysuria, frequency and urgency.  Musculoskeletal:  Positive for back pain and joint pain.  Skin: Negative.  Negative for itching and rash.  Neurological:  Negative for dizziness, tingling, focal weakness, weakness and headaches.  Endo/Heme/Allergies:  Does not bruise/bleed easily.       Hair loss.  Psychiatric/Behavioral:  Negative for depression. The patient is not nervous/anxious  and does not have insomnia.      MEDICAL HISTORY:  Past Medical History:  Diagnosis Date   Anemia    in past   Anxiety    Breast cancer (HCC) 08/24/2016   right breast UOQ   Cancer (HCC)    skin ca   Colon polyp    COVID-19 02/14/2020   received treatment   Depression    GERD (gastroesophageal reflux disease)    Hypercholesteremia    Hypertension    Osteoarthritis    Personal history of radiation therapy 2018   RIGHT BREAST CA UOQ   Scoliosis of lumbar spine    Thyroid nodule 04/17/2016    SURGICAL HISTORY: Past Surgical History:  Procedure Laterality Date   ABDOMINAL HYSTERECTOMY     APPENDECTOMY     BREAST BIOPSY Right 08/24/2016   9:30 - invasive mammary carcinoma with lobular features   BREAST BIOPSY Right 08/24/2016   10:00 - invasive mammary carcinoma with lobular features   BREAST LUMPECTOMY Right 10/09/2016   INVASIVE CARCINOMA, TUBULO-LOBULAR VARIANT, CLEAR MARGINS, NEGATIVE LN'S   CATARACT EXTRACTION W/PHACO Left 04/06/2020   Procedure: CATARACT EXTRACTION PHACO AND INTRAOCULAR LENS PLACEMENT (IOC) LEFT;  Surgeon: Galen Manila, MD;  Location: [facility];  Service: Ophthalmology;  Laterality: Left;  5.49 0:33.9   CATARACT EXTRACTION W/PHACO Right 04/20/2020   Procedure: CATARACT EXTRACTION PHACO AND INTRAOCULAR LENS PLACEMENT (IOC) RIGHT;  Surgeon:  Galen Manila, MD;  Location: [facility];  Service: Ophthalmology;  Laterality: Right;  7.26 0:47.2   COLONOSCOPY W/ POLYPECTOMY  2003, 2008, 2014   COLONOSCOPY WITH PROPOFOL N/A 12/03/2017   Procedure: COLONOSCOPY WITH PROPOFOL;  Surgeon: Scot Jun, MD;  Location: [facility] END[facility]COPY;  Service: Endoscopy;  Laterality: N/A;   ESOPHAGOGASTRODUODENOSCOPY     foreign body   ESOPHAGOGASTRODUODENOSCOPY (EGD) WITH PROPOFOL N/A 12/03/2017   Procedure: ESOPHAGOGASTRODUODENOSCOPY (EGD) WITH PROPOFOL;  Surgeon: Scot Jun, MD;  Location: [facility] END[facility];  Service: Endoscopy;  Laterality: N/A;    JOINT REPLACEMENT Right 2008   TKR   NOSE SURGERY     PARTIAL MASTECTOMY WITH NEEDLE LOCALIZATION Right 09/29/2016   Procedure: PARTIAL MASTECTOMY WITH NEEDLE LOCALIZATION;  Surgeon: Nadeen Landau, MD;  Location: [facility] ORS;  Service: General;  Laterality: Right;   REPLACEMENT TOTAL KNEE Right 2008   SENTINEL NODE BIOPSY Right 09/29/2016   Procedure: SENTINEL NODE BIOPSY;  Surgeon: Nadeen Landau, MD;  Location: [facility] ORS;  Service: General;  Laterality: Right;    SOCIAL HISTORY: Social History   Socioeconomic History   Marital status: Widowed    Spouse name: Not on file   Number of children: Not on file   Years of education: Not on file   Highest education level: Not on file  Occupational History   Not on file  Tobacco Use   Smoking status: Former    Current packs/day: 0.00    Average packs/day: 1 pack/day for 10.0 years (10.0 ttl pk-yrs)    Types: Cigarettes    Start date: 05/19/1955    Quit date: 05/18/1965    Years since quitting: 57.7   Smokeless tobacco: Never  Vaping Use   Vaping status: Never Used  Substance and Sexual Activity   Alcohol use: Not Currently    Alcohol/week: 7.0 standard drinks of alcohol    Types: 7 Glasses of wine per week   Drug use: No   Sexual activity: Not Currently  Other Topics Concern   Not on file  Social History Narrative   Not on file   Social Determinants of Health   Financial Resource Strain: Low Risk  (03/02/2022)   Received from [facility], [facility]   Overall Financial Resource Strain (CARDIA)    Difficulty of Paying Living Expenses: Not hard at all  Food Insecurity: No Food Insecurity (03/02/2022)   Received from [facility], [facility]   Hunger Vital Sign    Worried About Running Out of Food in the Last Year: Never true    Ran Out of Food in the Last Year: Never true  Transportation Hall: No Transportation Hall (03/02/2022)   Received from  [facility], [facility]   [facility] - Transportation    In the past 12 months, has lack of transportation kept you from medical appointments or from getting medications?: No    Lack of Transportation (Non-Medical): No  Physical Activity: Inactive (12/03/2017)   Exercise Vital Sign    Days of Exercise per Week: 0 days    Minutes of Exercise per Session: 0 min  Stress: No Stress Concern Present (12/03/2017)   [employer] [facility] [employer] - Occupational Stress Questionnaire    Feeling of Stress : Only a little  Social Connections: Unknown (12/03/2017)   Social Connection and Isolation Panel [NHANES]    Frequency of Communication with Friends and Family: Patient declined    Frequency  of Social Gatherings with Friends and Family: Patient declined    Attends Religious Services: Patient declined    Database administrator or Organizations: Patient declined    Attends Banker Meetings: Patient declined    Marital Status: Patient declined  Intimate Partner Violence: Unknown (12/03/2017)   Humiliation, Afraid, Rape, and Kick questionnaire    Fear of Current or Ex-Partner: Patient declined    Emotionally Abused: Patient declined    Physically Abused: Patient declined    Sexually Abused: Patient declined    FAMILY HISTORY: Family History  Problem Relation Age of Onset   Hypertension Mother    Depression Mother    Heart failure Father    Obesity Brother    Heart disease Brother    Stroke Brother    Depression Brother    Breast cancer Cousin    Breast cancer Cousin    Breast cancer Cousin     ALLERGIES:  is allergic to sulfamethoxazole, other, statins, trazodone, ciprofloxacin, and sulfa antibiotics.  MEDICATIONS:  Current Outpatient Medications  Medication Sig Dispense Refill   Ascorbic Acid (VITAMIN C) 100 MG tablet Take 100 mg by mouth daily.     calcium carbonate 1250 MG capsule Take 1,250 mg by mouth daily.      carvedilol  (COREG) 6.25 MG tablet Take 6.25 mg by mouth 2 (two) times daily with a meal.     Cholecalciferol (VITAMIN D3) 1000 units CAPS Take 2,000 Units by mouth daily.     denosumab (PROLIA) 60 MG/ML SOLN injection Inject 60 mg into the skin every 6 (six) months. Administer in upper arm, thigh, or abdomen     ibuprofen (ADVIL) 400 MG tablet Take 400 mg by mouth every 4 (four) hours as needed.     lisinopril (ZESTRIL) 40 MG tablet Take 40 mg by mouth daily.     loperamide (IMODIUM) 2 MG capsule Take 2 mg by mouth as needed for diarrhea or loose stools.      metoprolol succinate (TOPROL-XL) 25 MG 24 hr tablet Take 1 tablet by mouth daily.     omeprazole (PRILOSEC) 40 MG capsule Take by mouth.     spironolactone (ALDACTONE) 25 MG tablet Take 12.5 mg by mouth daily.     Turmeric Curcumin 500 MG CAPS Take 1,500 mg by mouth daily.      No current facility-administered medications for this visit.      Marland Kitchen  PHYSICAL EXAMINATION: ECOG PERFORMANCE STATUS: 0 - Asymptomatic  Vitals:   02/05/23 1515 02/05/23 1532  BP: (!) 156/63 (!) 142/67  Pulse: 72   Temp: 98.5 F (36.9 C)   SpO2: 97%    Filed Weights   02/05/23 1515  Weight: 179 lb 6.4 oz (81.4 kg)    Physical Exam Vitals and nursing note reviewed.  Constitutional:      Comments: Alone.      HENT:     Head: Normocephalic and atraumatic.     Mouth/Throat:     Pharynx: Oropharynx is clear.  Eyes:     Extraocular Movements: Extraocular movements intact.     Pupils: Pupils are equal, round, and reactive to light.  Cardiovascular:     Rate and Rhythm: Normal rate and regular rhythm.  Pulmonary:     Comments: Decreased breath sounds bilaterally.  Abdominal:     Palpations: Abdomen is soft.  Musculoskeletal:        General: Normal range of motion.     Cervical back: Normal range of motion.  Skin:    General: Skin is warm.  Neurological:     General: No focal deficit present.     Mental Status: She is alert and oriented to person,  place, and time.  Psychiatric:        Behavior: Behavior normal.        Judgment: Judgment normal.      LABORATORY DATA:  I have reviewed the data as listed Lab Results  Component Value Date   WBC 6.6 02/05/2023   HGB 11.5 (L) 02/05/2023   HCT 34.6 (L) 02/05/2023   MCV 91.3 02/05/2023   PLT 223 02/05/2023   Recent Labs    06/05/22 1422 02/05/23 1501  NA 137 138  K 3.7 3.9  CL 104 102  CO2 24 26  GLUCOSE 104* 93  BUN 26* 27*  CREATININE 1.01* 0.90  CALCIUM 9.4 10.0  GFRNONAA 55* >60  PROT 7.3 6.8  ALBUMIN 4.7 4.4  AST 26 19  ALT 19 16  ALKPHOS 44 47  BILITOT 0.5 0.4    RADIOGRAPHIC STUDIES: I have personally reviewed the radiological images as listed and agreed with the findings in the report. DG Foot Complete Left  Result Date: 02/02/2023 CLINICAL DATA:  Acute left foot pain. EXAM: LEFT FOOT - COMPLETE 3+ VIEW COMPARISON:  June 10, 2019. FINDINGS: There is no evidence of fracture or dislocation. Mild degenerative changes seen involving first metatarsophalangeal joint. Mild posterior calcaneal spurring is noted. Soft tissues are unremarkable. IMPRESSION: No acute abnormality seen. Electronically Signed   By: Lupita Raider M.D.   On: 02/02/2023 14:46    ASSESSMENT & PLAN:   Carcinoma of upper-outer quadrant of right breast in female, estrogen receptor positive (HCC) # JULY 2018- Multifocal RIGHT breast cancer ER Positive-stage I-high risk MammaPrint; no chemotherapy; second opinion at [facility];Dr.Marcom].  OFF Aromasin- sec to MSK pain/hair loss [Holding since march 2024]; discontinue sec to arthralgia/hair loss patient preference.  Clinically no evidence of recurrence.  Mammogram June 2024- WNL.   # OCT 2023- CT scan CAP- Incidental Hyperdense 1.1 cm posterior interpolar left renal cortical lesion, indeterminate. Recommend further characterization with renal mass protocol MRI (preferred) or CT abdomen without and with IV contrast on a short term outpatient basis.  Would want to wait/ wants to bring to PCP attention.   # OCT 2023- CT scan CAP- Incidental Simple 5.4 cm right adnexal cyst. Recommend short-term outpatient follow-up with pelvic ultrasound. Patient wants to defer to PCP for work up.   # MSK/Hx of OA-[s/p knee steroids; Dr.Poggi]-grade 1-2; chronic joint pains/arthritis-even before patient started Aromasin.  Monitor for now.  #  Osteoporosis:The BMD [July 2021] T-score of -2.8. Prolia today; AUG 2023- BMD-T-score= -.2.8. continue with prolia. Stable.    # Anemia- mild Hb 11.5; recommend PO Iron gentle iron-poor tolerance/dairrhea. 2019- last colo- Discussed regarding adding these foods. stable.  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  # Bilateral lower extremity swelling-clinically not suspicious of DVT/PE.  Suspect dependent edema/venous stasis changes; recommend elevation. stable.  *mychart # DISPOSITION: # prolia today # Follow up in  6  months for MD; labs- [facility] with diff, CMP, CA27.29], and Prolia- Dr.B     All questions were answered. The patient knows to call the clinic with any problems, questions or concerns.    Earna Coder, MD 02/05/2023 4:15 PM

## 2023-02-05 NOTE — Assessment & Plan Note (Addendum)
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  OFF Aromasin- sec to MSK pain/hair loss [Holding since march 2024]; discontinue sec to arthralgia/hair loss patient preference.  Clinically no evidence of recurrence.  Mammogram June 2024- WNL.   # OCT 2023- CT scan CAP- Incidental Hyperdense 1.1 cm posterior interpolar left renal cortical lesion, indeterminate. Recommend further characterization with renal mass protocol MRI (preferred) or CT abdomen without and with IV contrast on a short term outpatient basis. Would want to wait/ wants to bring to PCP attention.   # OCT 2023- CT scan CAP- Incidental Simple 5.4 cm right adnexal cyst. Recommend short-term outpatient follow-up with pelvic ultrasound. Patient wants to defer to PCP for work up.   # MSK/Hx of OA-[s/p knee steroids; Dr.Poggi]-grade 1-2; chronic joint pains/arthritis-even before patient started Aromasin.  Monitor for now.  #  Osteoporosis:The BMD [July 2021] T-score of -2.8. Prolia today; AUG 2023- BMD-T-score= -.2.8. continue with prolia. Stable.    # Anemia- mild Hb 11.5; recommend PO Iron gentle iron-poor tolerance/dairrhea. 2019- last colo- Discussed regarding adding these foods. stable.  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  # Bilateral lower extremity swelling-clinically not suspicious of DVT/PE.  Suspect dependent edema/venous stasis changes; recommend elevation. stable.  *mychart # DISPOSITION: # prolia today # Follow up in  6  months for MD; labs- [facility] with diff, CMP, CA27.29], and Prolia- Dr.B

## 2023-02-05 NOTE — Progress Notes (Signed)
MRI of hip and back. U/S ovary.  Discuss Aromasin, doesn't want to restart.

## 2023-02-06 LAB — CANCER ANTIGEN 27.29: CA 27.29: <9 U/mL (ref 0.0–38.6)

## 2023-05-28 ENCOUNTER — Telehealth: Payer: Self-pay

## 2023-05-28 DIAGNOSIS — Z170 Estrogen receptor positive status [ER+]: Secondary | ICD-10-CM

## 2023-05-28 NOTE — Telephone Encounter (Signed)
 Currently receiving Prolia inj last given on 02/05/23.  Patient told last week that she has tooth decay with one of the teeth in the back that has been evaluated by "root canal dentist" who says may need a dental implant.  Not scheduled to see oral surgeon yet.    Would like Dr. B opinion on treatment for tooth decay while getting prolia also what how much calcium should she be taking  now that she is not taking exemestane.

## 2023-05-29 ENCOUNTER — Encounter: Payer: Self-pay | Admitting: Internal Medicine

## 2023-05-29 NOTE — Telephone Encounter (Addendum)
 Message left on voicemail with recommendation.  Will also send MyChart message as well  Vit D order entered.

## 2023-08-08 ENCOUNTER — Inpatient Hospital Stay: Payer: Medicare Other | Attending: Internal Medicine

## 2023-08-08 ENCOUNTER — Encounter: Payer: Self-pay | Admitting: Internal Medicine

## 2023-08-08 ENCOUNTER — Inpatient Hospital Stay: Payer: Medicare Other

## 2023-08-08 ENCOUNTER — Inpatient Hospital Stay (HOSPITAL_BASED_OUTPATIENT_CLINIC_OR_DEPARTMENT_OTHER): Payer: Medicare Other | Admitting: Internal Medicine

## 2023-08-08 VITALS — BP 115/68 | HR 75 | Temp 97.5°F | Resp 12 | Ht 64.0 in | Wt 168.4 lb

## 2023-08-08 DIAGNOSIS — Z170 Estrogen receptor positive status [ER+]: Secondary | ICD-10-CM | POA: Diagnosis not present

## 2023-08-08 DIAGNOSIS — Z803 Family history of malignant neoplasm of breast: Secondary | ICD-10-CM | POA: Insufficient documentation

## 2023-08-08 DIAGNOSIS — N183 Chronic kidney disease, stage 3 unspecified: Secondary | ICD-10-CM | POA: Insufficient documentation

## 2023-08-08 DIAGNOSIS — L659 Nonscarring hair loss, unspecified: Secondary | ICD-10-CM | POA: Insufficient documentation

## 2023-08-08 DIAGNOSIS — Z9011 Acquired absence of right breast and nipple: Secondary | ICD-10-CM | POA: Diagnosis not present

## 2023-08-08 DIAGNOSIS — M810 Age-related osteoporosis without current pathological fracture: Secondary | ICD-10-CM

## 2023-08-08 DIAGNOSIS — D649 Anemia, unspecified: Secondary | ICD-10-CM | POA: Insufficient documentation

## 2023-08-08 DIAGNOSIS — C50411 Malignant neoplasm of upper-outer quadrant of right female breast: Secondary | ICD-10-CM

## 2023-08-08 DIAGNOSIS — Z8616 Personal history of COVID-19: Secondary | ICD-10-CM | POA: Diagnosis not present

## 2023-08-08 DIAGNOSIS — Z87891 Personal history of nicotine dependence: Secondary | ICD-10-CM | POA: Diagnosis not present

## 2023-08-08 LAB — CBC WITH DIFFERENTIAL (CANCER CENTER ONLY)
Abs Immature Granulocytes: 0.02 10*3/uL (ref 0.00–0.07)
Basophils Absolute: 0.1 10*3/uL (ref 0.0–0.1)
Basophils Relative: 1 %
Eosinophils Absolute: 0.1 10*3/uL (ref 0.0–0.5)
Eosinophils Relative: 2 %
HCT: 35.9 % — ABNORMAL LOW (ref 36.0–46.0)
Hemoglobin: 11.7 g/dL — ABNORMAL LOW (ref 12.0–15.0)
Immature Granulocytes: 0 %
Lymphocytes Relative: 26 %
Lymphs Abs: 1.6 10*3/uL (ref 0.7–4.0)
MCH: 30 pg (ref 26.0–34.0)
MCHC: 32.6 g/dL (ref 30.0–36.0)
MCV: 92.1 fL (ref 80.0–100.0)
Monocytes Absolute: 0.6 10*3/uL (ref 0.1–1.0)
Monocytes Relative: 10 %
Neutro Abs: 3.8 10*3/uL (ref 1.7–7.7)
Neutrophils Relative %: 61 %
Platelet Count: 242 10*3/uL (ref 150–400)
RBC: 3.9 MIL/uL (ref 3.87–5.11)
RDW: 13.8 % (ref 11.5–15.5)
WBC Count: 6.2 10*3/uL (ref 4.0–10.5)
nRBC: 0 % (ref 0.0–0.2)

## 2023-08-08 LAB — VITAMIN D 25 HYDROXY (VIT D DEFICIENCY, FRACTURES): Vit D, 25-Hydroxy: 36.74 ng/mL (ref 30–100)

## 2023-08-08 LAB — CMP (CANCER CENTER ONLY)
ALT: 15 U/L (ref 0–44)
AST: 19 U/L (ref 15–41)
Albumin: 4.6 g/dL (ref 3.5–5.0)
Alkaline Phosphatase: 43 U/L (ref 38–126)
Anion gap: 11 (ref 5–15)
BUN: 28 mg/dL — ABNORMAL HIGH (ref 8–23)
CO2: 25 mmol/L (ref 22–32)
Calcium: 9.6 mg/dL (ref 8.9–10.3)
Chloride: 101 mmol/L (ref 98–111)
Creatinine: 0.83 mg/dL (ref 0.44–1.00)
GFR, Estimated: >60 mL/min (ref >60)
Glucose, Bld: 114 mg/dL — ABNORMAL HIGH (ref 70–99)
Potassium: 4.3 mmol/L (ref 3.5–5.1)
Sodium: 137 mmol/L (ref 135–145)
Total Bilirubin: 0.7 mg/dL (ref 0.0–1.2)
Total Protein: 7.1 g/dL (ref 6.5–8.1)

## 2023-08-08 MED ORDER — DENOSUMAB 60 MG/ML ~~LOC~~ SOSY
60.0000 mg | PREFILLED_SYRINGE | Freq: Once | SUBCUTANEOUS | Status: DC
  Filled 2023-08-08: qty 1

## 2023-08-08 NOTE — Assessment & Plan Note (Addendum)
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  OFF Aromasin - sec to MSK pain/hair loss [Holding since march 2024]; discontinue sec to arthralgia/hair loss patient preference.  Clinically no evidence of recurrence.  Mammogram June 2024- WNL.   # MSK/Hx of OA-[s/p knee steroids; Dr.Poggi]-grade 1-2; chronic joint pains/arthritis-even before patient started Aromasin .  Monitor for now.  #  Osteoporosis:The BMD [July 2021] T-score of -2.8. Prolia  today; AUG 2023- BMD-T-score= -.2.8. HOLD prolia .switch to Reclast at next visit.  Awaiting dental workup in June 2025.  Recommend dental clearance.  Will get bone density in a year.  # Anemia- mild Hb 11.5; recommend PO Iron  gentle iron -poor tolerance/dairrhea. 2019- last colo- Discussed regarding adding these foods. stable.  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  *mychart # DISPOSITION: # HOLD prolia  today # Follow up in  6  months for MD; labs- [facility] with diff, CMP, CA27.29], vit D levels; reclast- - Dr.B  Cc.Dr.Zachary Felipe Horton-

## 2023-08-08 NOTE — Progress Notes (Signed)
 [facility] CONSULT NOTE  Patient Care Team: Rosebud Confer, MD as PCP - General (Family Medicine) Benancio Bracket, MD (Inactive) as Referring Physician (Surgery) Gwyn Leos, MD as Consulting Physician (Oncology)  CHIEF COMPLAINTS/PURPOSE OF CONSULTATION: Breast cancer  #  Oncology History Overview Note        Assessment:  Allison Hall is a 83 y.o. female with multi-focal right breast cancer s/p partial mastectomy with sentinel lymph node biopsy on 09/29/2016.  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  Three sentinel lymph nodes were negative.  Tumor was ER + (> 90%), PR + (> 90%) and Her2/neu 2+ (negative by FISH).  Pathologic stage was T1c(m) N0(sn).  CA27.29 was 13.4 on 09/06/2016.   MammaPrint revealed a 29% risk of recurrence (high).  She was seen for second opinion at [facility] on 10/25/2016.  Chemotherapy was not recommended.   Bilateral screening mammogram on 07/24/2016 revealed a possible area of distortion in the right breast. Diagnostic mammogram on 08/10/2016 revealed 2 suspicious masses in the upper-outer quadrant of the right breast in a broad area of confluent distortion in the upper-outer quadrant of the right breast.  There are at least 3 areas of confluent distortion spanning approximately 6.5 cm of tissue.  Ultrasound on 07/31/2016 revealed  an irregular hypoechoic 1.2 x 1.1 x 1.2 cm at the 9:30 position 7 cm from the nipple.  In addition, there was an irregular 1.9 x 0.8 x 1.2 cm mass at the 10 o'clock position 5 cm from the nipple.  There were multiple small normal-appearing lymph nodes.   Breast MRI on 09/16/2016 revealed an irregular 1.6 x 1.2 x 1.6 cm enhancing mass adjacent to the clip artifact within the upper, outer right breast, middle depth, corresponding to the mass seen at 10 o'clock. There was an additional irregular  enhancing mass adjacent to the more posterior clip artifact measuring 2.6 x 1.4 x 1.6 cm, corresponding  to the mass seen at 9:30 position. Together, these 2 masses span 6.3 cm of breast tissue from anterior to posterior. There was no definite additional suspicious area of enhancement within the right breast to correspond to the third area of distortion identified on diagnostic mammogram dated 08/10/2016.  Left breast revealed no suspicious mass or enhancement.   Bilateral diagnostic mammogram on 09/03/2019 revealed no evidence of malignancy.   She received radiation from 11/15/2016 - 01/08/2017.  She was Femara  (01/17/2017 - 05/16/2017) secondary to arthralgias.  She discontinued Arimidex  after 19 days (last 06/14/2017) secondary to fatigue and polyarthralgias.  She began Aromasin  on 06/27/2017. STOPPED AROMASIN  MARCH 2024 [arthralgias hair loss]   CA27.29 has been followed:  13.4 on 09/06/2016, 13.0 on 05/16/2017, 17.1 on 08/29/2017, 12.1 on 06/04/2018, 13.8 on 10/01/2018, and 15.6 on 02/04/2019.   Bone density study on 09/30/2015 revealed osteopenia with a T-score of -2.1 in the right femoral neck.  Bone density study revealed osteoporosis on 10/02/2017 with a T-score of -3.1. She began Prolia  on 02/14/2017 (last 10/09/2019).   She was diagnosed with iron  deficiency anemia and B12 deficiency on 08/29/2017.  TSH and folate were normal.  She is on oral B12 500 mcg a day.  Intrinsic factor and anti-parietal cell antibodies were normal on 01/02/2018.  B12 was 888 on 01/29/2018 and 544 on 02/04/2019.   She received Venofer  weekly x 3 (10/31/2017 - 11/13/2017) and x 4 (06/05/2018 - 06/26/2018).   Ferritin  has been followed: 10 on 08/29/2017, 174 on 12/12/2017, 74 on 01/02/2018, 64 on 01/29/2018, 3 on 06/04/2018, 205 on 10/01/2018, and 162 on 02/04/2019.   EGD and colonoscopy on 12/03/2017. Polyps were noted. Pathology was negative for dysplasia and malignancy.    She tested positive for COVID-19 on  02/14/2020.   Symptomatically, she is doing well.  She denies any breast concerns.  She has some hair thinning.  Exam reveals no evidence of recurrent disease.   Pl   Carcinoma of upper-outer quadrant of right breast in female, estrogen receptor positive (HCC)  08/24/2016 Initial Diagnosis   Carcinoma of upper-outer quadrant of breast in female, estrogen receptor positive (HCC)     HISTORY OF PRESENTING ILLNESS: Alone.  Ambulating independently.  Allison Hall 83 y.o.  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  In the interm She has had an echocardiogram Dr. Geoffery Kiel.   Patient awaiting tooth extractions in June 2025. Concerns of prolia  with dental issues.    Patient continues to have other chronic joint pains not any worse.Otherwise denies any nausea vomiting headaches.  Chronic mild intermittent swelling in the legs.  Review of Systems  Constitutional:  Positive for malaise/fatigue. Negative for chills, diaphoresis, fever and weight loss.  HENT:  Negative for nosebleeds and sore throat.   Eyes:  Negative for double vision.  Respiratory:  Negative for cough, hemoptysis, sputum production, shortness of breath and wheezing.   Cardiovascular:  Negative for chest pain, palpitations, orthopnea and leg swelling.  Gastrointestinal:  Negative for abdominal pain, blood in stool, constipation, diarrhea, heartburn, melena, nausea and vomiting.  Genitourinary:  Negative for dysuria, frequency and urgency.  Musculoskeletal:  Positive for back pain and joint pain.  Skin: Negative.  Negative for itching and rash.  Neurological:  Negative for dizziness, tingling, focal weakness, weakness and headaches.  Endo/Heme/Allergies:  Does not bruise/bleed easily.       Hair loss.  Psychiatric/Behavioral:  Negative for depression. The patient is  not nervous/anxious and does not have insomnia.      MEDICAL HISTORY:  Past Medical History:  Diagnosis Date   Anemia    in past   Anxiety    Breast cancer (HCC) 08/24/2016   right breast UOQ   Cancer (HCC)    skin ca   Colon polyp    COVID-19 02/14/2020   received treatment   Depression    GERD (gastroesophageal reflux disease)    Hypercholesteremia    Hypertension    Osteoarthritis    Personal history of radiation therapy 2018   RIGHT BREAST CA UOQ   Scoliosis of lumbar spine    Thyroid  nodule 04/17/2016    SURGICAL HISTORY: Past Surgical History:  Procedure Laterality Date   ABDOMINAL HYSTERECTOMY     APPENDECTOMY     BREAST BIOPSY Right 08/24/2016   9:30 - invasive mammary carcinoma with lobular features   BREAST BIOPSY Right 08/24/2016   10:00 - invasive mammary carcinoma with lobular features   BREAST LUMPECTOMY Right 10/09/2016   INVASIVE CARCINOMA, TUBULO-LOBULAR VARIANT, CLEAR MARGINS, NEGATIVE LN'S   CATARACT EXTRACTION W/PHACO Left 04/06/2020   Procedure: CATARACT EXTRACTION PHACO AND INTRAOCULAR LENS PLACEMENT (IOC) LEFT;  Surgeon: Clair Crews, MD;  Location: [facility];  Service: Ophthalmology;  Laterality: Left;  5.49 0:33.9   CATARACT EXTRACTION W/PHACO Right 04/20/2020   Procedure: CATARACT EXTRACTION PHACO AND INTRAOCULAR  LENS PLACEMENT (IOC) RIGHT;  Surgeon: Clair Crews, MD;  Location: [facility];  Service: Ophthalmology;  Laterality: Right;  7.26 0:47.2   COLONOSCOPY W/ POLYPECTOMY  2003, 2008, 2014   COLONOSCOPY WITH PROPOFOL  N/A 12/03/2017   Procedure: COLONOSCOPY WITH PROPOFOL ;  Surgeon: Cassie Click, MD;  Location: [facility];  Service: Endoscopy;  Laterality: N/A;   ESOPHAGOGASTRODUODENOSCOPY     foreign body   ESOPHAGOGASTRODUODENOSCOPY (EGD) WITH PROPOFOL  N/A 12/03/2017   Procedure: ESOPHAGOGASTRODUODENOSCOPY (EGD) WITH PROPOFOL ;  Surgeon: Cassie Click, MD;  Location: [facility] END[facility];  Service: Endoscopy;   Laterality: N/A;   JOINT REPLACEMENT Right 2008   TKR   NOSE SURGERY     PARTIAL MASTECTOMY WITH NEEDLE LOCALIZATION Right 09/29/2016   Procedure: PARTIAL MASTECTOMY WITH NEEDLE LOCALIZATION;  Surgeon: Benancio Bracket, MD;  Location: [facility] ORS;  Service: General;  Laterality: Right;   REPLACEMENT TOTAL KNEE Right 2008   SENTINEL NODE BIOPSY Right 09/29/2016   Procedure: SENTINEL NODE BIOPSY;  Surgeon: Benancio Bracket, MD;  Location: [facility] OR[facility];  Service: General;  Laterality: Right;    SOCIAL HISTORY: Social History   Socioeconomic History   Marital status: Widowed    Spouse name: Not on file   Number of children: Not on file   Years of education: Not on file   Highest education level: Not on file  Occupational History   Not on file  Tobacco Use   Smoking status: Former    Current packs/day: 0.00    Average packs/day: 1 pack/day for 10.0 years (10.0 ttl pk-yrs)    Types: Cigarettes    Start date: 05/19/1955    Quit date: 05/18/1965    Years since quitting: 58.2   Smokeless tobacco: Never  Vaping Use   Vaping status: Never Used  Substance and Sexual Activity   Alcohol use: Not Currently    Alcohol/week: 7.0 standard drinks of alcohol    Types: 7 Glasses of wine per week   Drug use: No   Sexual activity: Not Currently  Other Topics Concern   Not on file  Social History Narrative   Not on file   Social Drivers of Health   Financial Resource Strain: Low Risk  (03/02/2022)   Received from [facility], [facility]   Overall Financial Resource Strain (CARDIA)    Difficulty of Paying Living Expenses: Not hard at all  Food Insecurity: No Food Insecurity (03/02/2022)   Received from [facility], [facility]   Hunger Vital Sign    Worried About Running Out of Food in the Last Year: Never true    Ran Out of Food in the Last Year: Never true  Transportation Needs: No Transportation Needs (03/02/2022)    Received from [facility], [facility]   [facility] - Transportation    In the past 12 months, has lack of transportation kept you from medical appointments or from getting medications?: No    Lack of Transportation (Non-Medical): No  Physical Activity: Inactive (12/03/2017)   Exercise Vital Sign    Days of Exercise per Week: 0 days    Minutes of Exercise per Session: 0 min  Stress: No Stress Concern Present (12/03/2017)   [employer] [facility] [employer] - Occupational Stress Questionnaire    Feeling of Stress : Only a little  Social Connections: Unknown (12/03/2017)   Social Connection and Isolation Panel [NHANES]    Frequency of Communication with Friends and Family:  Patient declined    Frequency of Social Gatherings with Friends and Family: Patient declined    Attends Religious Services: Patient declined    Active Member of Clubs or Organizations: Patient declined    Attends Banker Meetings: Patient declined    Marital Status: Patient declined  Intimate Partner Violence: Unknown (12/03/2017)   Humiliation, Afraid, Rape, and Kick questionnaire    Fear of Current or Ex-Partner: Patient declined    Emotionally Abused: Patient declined    Physically Abused: Patient declined    Sexually Abused: Patient declined    FAMILY HISTORY: Family History  Problem Relation Age of Onset   Hypertension Mother    Depression Mother    Heart failure Father    Obesity Brother    Heart disease Brother    Stroke Brother    Depression Brother    Breast cancer Cousin    Breast cancer Cousin    Breast cancer Cousin     ALLERGIES:  is allergic to sulfamethoxazole, other, statins, trazodone , ciprofloxacin, and sulfa antibiotics.  MEDICATIONS:  Current Outpatient Medications  Medication Sig Dispense Refill   calcium carbonate 1250 MG capsule Take 1,250 mg by mouth daily.      carvedilol (COREG) 6.25 MG tablet Take 6.25 mg by mouth 2 (two) times  daily with a meal.     Cholecalciferol (VITAMIN D3) 1000 units CAPS Take 2,000 Units by mouth daily.     denosumab  (PROLIA ) 60 MG/ML SOLN injection Inject 60 mg into the skin every 6 (six) months. Administer in upper arm, thigh, or abdomen     lisinopril (ZESTRIL) 40 MG tablet Take 40 mg by mouth daily.     loperamide (IMODIUM) 2 MG capsule Take 2 mg by mouth as needed for diarrhea or loose stools.      omeprazole (PRILOSEC) 40 MG capsule Take by mouth.     spironolactone (ALDACTONE) 25 MG tablet Take 25 mg by mouth daily.     Turmeric Curcumin 500 MG CAPS Take 1,500 mg by mouth daily.  (Patient not taking: Reported on 08/08/2023)     No current facility-administered medications for this visit.      Aaron Aas  PHYSICAL EXAMINATION: ECOG PERFORMANCE STATUS: 0 - Asymptomatic  Vitals:   08/08/23 1453  BP: 115/68  Pulse: 75  Resp: 12  Temp: (!) 97.5 F (36.4 C)  SpO2: 97%   Filed Weights   08/08/23 1453  Weight: 168 lb 6.4 oz (76.4 kg)    Physical Exam Vitals and nursing note reviewed.  Constitutional:      Comments: Alone.      HENT:     Head: Normocephalic and atraumatic.     Mouth/Throat:     Pharynx: Oropharynx is clear.  Eyes:     Extraocular Movements: Extraocular movements intact.     Pupils: Pupils are equal, round, and reactive to light.  Cardiovascular:     Rate and Rhythm: Normal rate and regular rhythm.  Pulmonary:     Comments: Decreased breath sounds bilaterally.  Abdominal:     Palpations: Abdomen is soft.  Musculoskeletal:        General: Normal range of motion.     Cervical back: Normal range of motion.  Skin:    General: Skin is warm.  Neurological:     General: No focal deficit present.     Mental Status: She is alert and oriented to person, place, and time.  Psychiatric:        Behavior: Behavior normal.  Judgment: Judgment normal.      LABORATORY DATA:  I have reviewed the data as listed Lab Results  Component Value Date   WBC 6.2  08/08/2023   HGB 11.7 (L) 08/08/2023   HCT 35.9 (L) 08/08/2023   MCV 92.1 08/08/2023   PLT 242 08/08/2023   Recent Labs    02/05/23 1501 08/08/23 1503  NA 138 137  K 3.9 4.3  CL 102 101  CO2 26 25  GLUCOSE 93 114*  BUN 27* 28*  CREATININE 0.90 0.83  CALCIUM 10.0 9.6  GFRNONAA >60 >60  PROT 6.8 7.1  ALBUMIN 4.4 4.6  AST 19 19  ALT 16 15  ALKPHOS 47 43  BILITOT 0.4 0.7    RADIOGRAPHIC STUDIES: I have personally reviewed the radiological images as listed and agreed with the findings in the report. No results found.  ASSESSMENT & PLAN:   Carcinoma of upper-outer quadrant of right breast in female, estrogen receptor positive (HCC) # JULY 2018- Multifocal RIGHT breast cancer ER Positive-stage I-high risk MammaPrint; no chemotherapy; second opinion at [facility];Dr.Marcom].  OFF Aromasin - sec to MSK pain/hair loss [Holding since march 2024]; discontinue sec to arthralgia/hair loss patient preference.  Clinically no evidence of recurrence.  Mammogram June 2024- WNL.   # MSK/Hx of OA-[s/p knee steroids; Dr.Poggi]-grade 1-2; chronic joint pains/arthritis-even before patient started Aromasin .  Monitor for now.  #  Osteoporosis:The BMD [July 2021] T-score of -2.8. Prolia  today; AUG 2023- BMD-T-score= -.2.8. HOLD prolia .switch to Reclast at next visit.    # Anemia- mild Hb 11.5; recommend PO Iron  gentle iron -poor tolerance/dairrhea. 2019- last colo- Discussed regarding adding these foods. stable.  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  *mychart # DISPOSITION: # HOLD prolia  today # Follow up in  6  months for MD; labs- [facility] with diff, CMP, CA27.29], vit D levels; reclast- - Dr.B  Cc.Dr.Zachary Felipe Horton-      All questions were answered. The patient knows to call the clinic with any problems, questions or concerns.    Gwyn Leos, MD 08/08/2023 4:41 PM

## 2023-08-08 NOTE — Progress Notes (Signed)
 She has had an echocardiogram Dr. Geoffery Kiel.  Having some teeth extractions in June. Shoulder reverse replacement June 17th.  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  Should she continue to take the same amount of vit d and calcium she is on.  Does she still need to continue the prolia  inj?  Do you know anything about the prolia  interfering with the teeth implants?

## 2023-08-09 LAB — CANCER ANTIGEN 27.29: CA 27.29: 10.9 U/mL (ref 0.0–38.6)

## 2023-08-13 ENCOUNTER — Ambulatory Visit: Payer: Self-pay | Admitting: Internal Medicine

## 2023-08-27 ENCOUNTER — Other Ambulatory Visit: Payer: Self-pay | Admitting: Surgery

## 2023-09-04 ENCOUNTER — Encounter
Admission: RE | Admit: 2023-09-04 | Discharge: 2023-09-04 | Disposition: A | Discharge status: Home / Self Care | Source: Ambulatory Visit | Attending: Surgery | Admitting: Surgery

## 2023-09-04 ENCOUNTER — Other Ambulatory Visit: Payer: Self-pay

## 2023-09-04 VITALS — BP 147/84 | HR 69 | Resp 16 | Ht 64.0 in | Wt 166.4 lb

## 2023-09-04 DIAGNOSIS — Z01812 Encounter for preprocedural laboratory examination: Secondary | ICD-10-CM | POA: Insufficient documentation

## 2023-09-04 HISTORY — DX: Primary osteoarthritis, right shoulder: M19.011

## 2023-09-04 HISTORY — DX: Unspecified malignant neoplasm of skin, unspecified: C44.90

## 2023-09-04 LAB — URINALYSIS, ROUTINE W REFLEX MICROSCOPIC
Bilirubin Urine: NEGATIVE
Glucose, UA: NEGATIVE mg/dL
Hgb urine dipstick: NEGATIVE
Ketones, ur: NEGATIVE mg/dL
Leukocytes,Ua: NEGATIVE
Nitrite: NEGATIVE
Protein, ur: NEGATIVE mg/dL
Specific Gravity, Urine: 1.016 (ref 1.005–1.030)
pH: 5 (ref 5.0–8.0)

## 2023-09-04 LAB — SURGICAL PCR SCREEN
MRSA, PCR: NEGATIVE
Staphylococcus aureus: NEGATIVE

## 2023-09-04 NOTE — Patient Instructions (Addendum)
 Your procedure is scheduled on: 09/11/23 - Tuesday Report to the Registration Desk on the 1st floor of the [facility]. To find out your arrival time, please call [phone] between 1PM - 3PM on: 09/10/23 - Monday If your arrival time is 6:00 am, do not arrive before that time as the [facility] entrance doors do not open until 6:00 am.  REMEMBER: Instructions that are not followed completely may result in serious medical risk, up to and including death; or upon the discretion of your surgeon and anesthesiologist your surgery may need to be rescheduled.  Do not eat food after midnight the night before surgery.  No gum chewing or hard candies.  You may however, drink CLEAR liquids up to 2 hours before you are scheduled to arrive for your surgery. Do not drink anything within 2 hours of your scheduled arrival time.  Clear liquids include: - water  - apple juice without pulp - gatorade (not RED colors) - black coffee or tea (Do NOT add milk or creamers to the coffee or tea) Do NOT drink anything that is not on this list.  In addition, your doctor has ordered for you to drink the provided:  Ensure Pre-Surgery Clear Carbohydrate Drink  Drinking this carbohydrate drink up to two hours before surgery helps to reduce insulin resistance and improve patient outcomes. Please complete drinking 2 hours before scheduled arrival time.  One week prior to surgery: Stop Anti-inflammatories (NSAIDS) such as Salonpas patch, Advil, Aleve, Ibuprofen, Motrin, Naproxen, Naprosyn and Aspirin based products such as Excedrin, Goody's Powder, BC Powder. You may continue to take Tylenol  if needed for pain up until the day of surgery.  Stop ANY OVER THE COUNTER supplements until after surgery : CALCIUM 600 + D , magnesium gluconate ,Multiple Vitamins-Minerals ,HAIR SKIN AND NAILS FORMULA ,Omega-3 Fatty Acids (FISH OIL) ,VITAMIN D3 .  HOLD lisinopril (ZESTRIL) and spironolactone (ALDACTONE) on the morning of  surgery.  Continue taking all of your other prescription medications up until the day before surgery.  ON THE DAY OF SURGERY ONLY TAKE THESE MEDICATIONS WITH SIPS OF WATER:  carvedilol (COREG)  omeprazole (PRILOSEC)   No Alcohol for 24 hours before or after surgery.  No Smoking including e-cigarettes for 24 hours before surgery.  No chewable tobacco products for at least 6 hours before surgery.  No nicotine patches on the day of surgery.  Do not use any "recreational" drugs for at least a week (preferably 2 weeks) before your surgery.  Please be advised that the combination of cocaine and anesthesia may have negative outcomes, up to and including death. If you test positive for cocaine, your surgery will be cancelled.  On the morning of surgery brush your teeth with toothpaste and water, you may rinse your mouth with mouthwash if you wish. Do not swallow any toothpaste or mouthwash.  Use CHG Soap or wipes as directed on instruction sheet.  Do not wear jewelry, make-up, hairpins, clips or nail polish.  For welded (permanent) jewelry: bracelets, anklets, waist bands, etc.  Please have this removed prior to surgery.  If it is not removed, there is a chance that hospital personnel will need to cut it off on the day of surgery.  Do not wear lotions, powders, or perfumes.   Contact lenses, hearing aids and dentures may not be worn into surgery.  Do not bring valuables to the hospital. [facility] is not responsible for any missing/lost belongings or valuables.   Total Shoulder Arthroplasty:  use  Benzoyl Peroxide 5% Gel as directed on instruction sheet.  Notify your doctor if there is any change in your medical condition (cold, fever, infection).  Wear comfortable clothing (specific to your surgery type) to the hospital.  After surgery, you can help prevent lung complications by doing breathing exercises.  Take deep breaths and cough every 1-2 hours. Your doctor may order a device  called an Incentive Spirometer to help you take deep breaths. When coughing or sneezing, hold a pillow firmly against your incision with both hands. This is called "splinting." Doing this helps protect your incision. It also decreases belly discomfort.  If you are being admitted to the hospital overnight, leave your suitcase in the car. After surgery it may be brought to your room.  In case of increased patient census, it may be necessary for you, the patient, to continue your postoperative care in the Same Day Surgery department.  If you are being discharged the day of surgery, you will not be allowed to drive home. You will need a responsible individual to drive you home and stay with you for 24 hours after surgery.   If you are taking public transportation, you will need to have a responsible individual with you.  Please call the Pre-admissions Testing Dept. at [phone] if you have any questions about these instructions.  Surgery Visitation Policy:  Patients having surgery or a procedure may have two visitors.  Children under the age of 25 must have an adult with them who is not the patient.  Inpatient Visitation:    Visiting hours are 7 a.m. to 8 p.m. Up to four visitors are allowed at one time in a patient room. The visitors may rotate out with other people during the day.  One visitor age 32 or older may stay with the patient overnight and must be in the room by 8 p.m.    Pre-operative 5 CHG Bath Instructions   You can play a key role in reducing the risk of infection after surgery. Your skin needs to be as free of germs as possible. You can reduce the number of germs on your skin by washing with CHG (chlorhexidine  gluconate) soap before surgery. CHG is an antiseptic soap that kills germs and continues to kill germs even after washing.   DO NOT use if you have an allergy to chlorhexidine /CHG or antibacterial soaps. If your skin becomes reddened or irritated, stop using the  CHG and notify one of our RNs at [phone].   Please shower with the CHG soap starting 4 days before surgery using the following schedule: 06/13 - 06/17.   Please keep in mind the following:  DO NOT shave, including legs and underarms, starting the day of your first shower.   You may shave your face at any point before/day of surgery.  Place clean sheets on your bed the day you start using CHG soap. Use a clean washcloth (not used since being washed) for each shower. DO NOT sleep with pets once you start using the CHG.   CHG Shower Instructions:  If you choose to wash your hair and private area, wash first with your normal shampoo/soap.  After you use shampoo/soap, rinse your hair and body thoroughly to remove shampoo/soap residue.  Turn the water OFF and apply about 3 tablespoons (45 ml) of CHG soap to a CLEAN washcloth.  Apply CHG soap ONLY FROM YOUR NECK DOWN TO YOUR TOES (washing for 3-5 minutes)  DO NOT use CHG soap on face,  private areas, open wounds, or sores.  Pay special attention to the area where your surgery is being performed.  If you are having back surgery, having someone wash your back for you may be helpful. Wait 2 minutes after CHG soap is applied, then you may rinse off the CHG soap.  Pat dry with a clean towel  Put on clean clothes/pajamas   If you choose to wear lotion, please use ONLY the CHG-compatible lotions on the back of this paper.     Additional instructions for the day of surgery: DO NOT APPLY any lotions, deodorants, cologne, or perfumes.   Put on clean/comfortable clothes.  Brush your teeth.  Ask your nurse before applying any prescription medications to the skin.      CHG Compatible Lotions   Aveeno Moisturizing lotion  Cetaphil Moisturizing Cream  Cetaphil Moisturizing Lotion  Clairol Herbal Essence Moisturizing Lotion, Dry Skin  Clairol Herbal Essence Moisturizing Lotion, Extra Dry Skin  Clairol Herbal Essence Moisturizing Lotion, Normal  Skin  Curel Age Defying Therapeutic Moisturizing Lotion with Alpha Hydroxy  Curel Extreme Care Body Lotion  Curel Soothing Hands Moisturizing Hand Lotion  Curel Therapeutic Moisturizing Cream, Fragrance-Free  Curel Therapeutic Moisturizing Lotion, Fragrance-Free  Curel Therapeutic Moisturizing Lotion, Original Formula  Eucerin Daily Replenishing Lotion  Eucerin Dry Skin Therapy Plus Alpha Hydroxy Crme  Eucerin Dry Skin Therapy Plus Alpha Hydroxy Lotion  Eucerin Original Crme  Eucerin Original Lotion  Eucerin Plus Crme Eucerin Plus Lotion  Eucerin TriLipid Replenishing Lotion  Keri Anti-Bacterial Hand Lotion  Keri Deep Conditioning Original Lotion Dry Skin Formula Softly Scented  Keri Deep Conditioning Original Lotion, Fragrance Free Sensitive Skin Formula  Keri Lotion Fast Absorbing Fragrance Free Sensitive Skin Formula  Keri Lotion Fast Absorbing Softly Scented Dry Skin Formula  Keri Original Lotion  Keri Skin Renewal Lotion Keri Silky Smooth Lotion  Keri Silky Smooth Sensitive Skin Lotion  Nivea Body Creamy Conditioning Oil  Nivea Body Extra Enriched Lotion  Nivea Body Original Lotion  Nivea Body Sheer Moisturizing Lotion Nivea Crme  Nivea Skin Firming Lotion  NutraDerm 30 Skin Lotion  NutraDerm Skin Lotion  NutraDerm Therapeutic Skin Cream  NutraDerm Therapeutic Skin Lotion  ProShield Protective Hand Cream  Provon moisturizing lotion  Preparing for Total Shoulder Arthroplasty  Before surgery, you can play an important role by reducing the number of germs on your skin by using the following products:  Benzoyl Peroxide Gel  o Reduces the number of germs present on the skin  o Applied twice a day to shoulder area starting two days before surgery  Chlorhexidine  Gluconate (CHG) Soap  o An antiseptic cleaner that kills germs and bonds with the skin to continue killing germs even after washing  o Used for showering the night before surgery and morning of  surgery  BENZOYL PEROXIDE 5% GEL  Please do not use if you have an allergy to benzoyl peroxide. If your skin becomes reddened/irritated stop using the benzoyl peroxide.  Starting two days before surgery, apply as follows:  1. Apply benzoyl peroxide in the morning and at night. Apply after taking a shower. If you are not taking a shower, clean entire shoulder front, back, and side along with the armpit with a clean wet washcloth.  2. Place a quarter-sized dollop on your shoulder and rub in thoroughly, making sure to cover the front, back, and side of your shoulder, along with the armpit.  2 days before beginning 09/09/23 apply once in AM and once in PM .  You will repeat on 09/10/23.  3. Do this twice a day for two days. (Last application is the night before surgery, AFTER using the CHG soap).  4. Do NOT apply benzoyl peroxide gel on the day of surgery.   How to Use an Incentive Spirometer  An incentive spirometer is a tool that measures how well you are filling your lungs with each breath. Learning to take long, deep breaths using this tool can help you keep your lungs clear and active. This may help to reverse or lessen your chance of developing breathing (pulmonary) problems, especially infection. You may be asked to use a spirometer: After a surgery. If you have a lung problem or a history of smoking. After a long period of time when you have been unable to move or be active. If the spirometer includes an indicator to show the highest number that you have reached, your health care provider or respiratory therapist will help you set a goal. Keep a log of your progress as told by your health care provider. What are the risks? Breathing too quickly may cause dizziness or cause you to pass out. Take your time so you do not get dizzy or light-headed. If you are in pain, you may need to take pain medicine before doing incentive spirometry. It is harder to take a deep breath if you are having  pain. How to use your incentive spirometer  Sit up on the edge of your bed or on a chair. Hold the incentive spirometer so that it is in an upright position. Before you use the spirometer, breathe out normally. Place the mouthpiece in your mouth. Make sure your lips are closed tightly around it. Breathe in slowly and as deeply as you can through your mouth, causing the piston or the ball to rise toward the top of the chamber. Hold your breath for 3-5 seconds, or for as long as possible. If the spirometer includes a coach indicator, use this to guide you in breathing. Slow down your breathing if the indicator goes above the marked areas. Remove the mouthpiece from your mouth and breathe out normally. The piston or ball will return to the bottom of the chamber. Rest for a few seconds, then repeat the steps 10 or more times. Take your time and take a few normal breaths between deep breaths so that you do not get dizzy or light-headed. Do this every 1-2 hours when you are awake. If the spirometer includes a goal marker to show the highest number you have reached (best effort), use this as a goal to work toward during each repetition. After each set of 10 deep breaths, cough a few times. This will help to make sure that your lungs are clear. If you have an incision on your chest or abdomen from surgery, place a pillow or a rolled-up towel firmly against the incision when you cough. This can help to reduce pain while taking deep breaths and coughing. General tips When you are able to get out of bed: Walk around often. Continue to take deep breaths and cough in order to clear your lungs. Keep using the incentive spirometer until your health care provider says it is okay to stop using it. If you have been in the hospital, you may be told to keep using the spirometer at home. Contact a health care provider if: You are having difficulty using the spirometer. You have trouble using the spirometer as  often as instructed. Your pain medicine is not  giving enough relief for you to use the spirometer as told. You have a fever. Get help right away if: You develop shortness of breath. You develop a cough with bloody mucus from the lungs. You have fluid or blood coming from an incision site after you cough. Summary An incentive spirometer is a tool that can help you learn to take long, deep breaths to keep your lungs clear and active. You may be asked to use a spirometer after a surgery, if you have a lung problem or a history of smoking, or if you have been inactive for a long period of time. Use your incentive spirometer as instructed every 1-2 hours while you are awake. If you have an incision on your chest or abdomen, place a pillow or a rolled-up towel firmly against your incision when you cough. This will help to reduce pain. Get help right away if you have shortness of breath, you cough up bloody mucus, or blood comes from your incision when you cough. This information is not intended to replace advice given to you by your health care provider. Make sure you discuss any questions you have with your health care provider. Document Revised: 06/02/2019 Document Reviewed: 06/02/2019 Elsevier Patient Education  2023 [employer].

## 2023-09-11 ENCOUNTER — Other Ambulatory Visit: Payer: Self-pay

## 2023-09-11 ENCOUNTER — Ambulatory Visit: Payer: Self-pay | Admitting: Urgent Care

## 2023-09-11 ENCOUNTER — Observation Stay
Admission: RE | Admit: 2023-09-11 | Discharge: 2023-09-13 | Disposition: A | Discharge status: Skilled Nursing Facility | Attending: Surgery | Admitting: Surgery

## 2023-09-11 ENCOUNTER — Ambulatory Visit

## 2023-09-11 ENCOUNTER — Encounter: Payer: Self-pay | Admitting: Surgery

## 2023-09-11 ENCOUNTER — Encounter
Admission: RE | Disposition: A | Discharge status: Skilled Nursing Facility | Payer: Self-pay | Source: Home / Self Care | Attending: Surgery

## 2023-09-11 ENCOUNTER — Ambulatory Visit: Admitting: Registered Nurse

## 2023-09-11 DIAGNOSIS — Z85828 Personal history of other malignant neoplasm of skin: Secondary | ICD-10-CM | POA: Diagnosis not present

## 2023-09-11 DIAGNOSIS — Z96651 Presence of right artificial knee joint: Secondary | ICD-10-CM | POA: Insufficient documentation

## 2023-09-11 DIAGNOSIS — Z853 Personal history of malignant neoplasm of breast: Secondary | ICD-10-CM | POA: Insufficient documentation

## 2023-09-11 DIAGNOSIS — Z96611 Presence of right artificial shoulder joint: Principal | ICD-10-CM

## 2023-09-11 DIAGNOSIS — I129 Hypertensive chronic kidney disease with stage 1 through stage 4 chronic kidney disease, or unspecified chronic kidney disease: Secondary | ICD-10-CM | POA: Insufficient documentation

## 2023-09-11 DIAGNOSIS — N1831 Chronic kidney disease, stage 3a: Secondary | ICD-10-CM | POA: Insufficient documentation

## 2023-09-11 DIAGNOSIS — M75101 Unspecified rotator cuff tear or rupture of right shoulder, not specified as traumatic: Principal | ICD-10-CM | POA: Insufficient documentation

## 2023-09-11 DIAGNOSIS — Z8616 Personal history of COVID-19: Secondary | ICD-10-CM | POA: Diagnosis not present

## 2023-09-11 DIAGNOSIS — M19011 Primary osteoarthritis, right shoulder: Secondary | ICD-10-CM | POA: Insufficient documentation

## 2023-09-11 DIAGNOSIS — Z79899 Other long term (current) drug therapy: Secondary | ICD-10-CM | POA: Insufficient documentation

## 2023-09-11 DIAGNOSIS — M7521 Bicipital tendinitis, right shoulder: Secondary | ICD-10-CM | POA: Diagnosis not present

## 2023-09-11 HISTORY — PX: REVERSE SHOULDER ARTHROPLASTY: SHX5054

## 2023-09-11 HISTORY — PX: BICEPT TENODESIS: SHX5116

## 2023-09-11 SURGERY — ARTHROPLASTY, SHOULDER, TOTAL, REVERSE
Anesthesia: General | Site: Shoulder | Laterality: Right

## 2023-09-11 MED ORDER — ONDANSETRON HCL 4 MG/2ML IJ SOLN
INTRAMUSCULAR | Status: AC
  Filled 2023-09-11: qty 2

## 2023-09-11 MED ORDER — LOPERAMIDE HCL 2 MG PO CAPS: 4.0000 mg | ORAL_CAPSULE | ORAL | Status: DC | PRN

## 2023-09-11 MED ORDER — PROPOFOL 10 MG/ML IV BOLUS
INTRAVENOUS | Status: AC
  Filled 2023-09-11: qty 40

## 2023-09-11 MED ORDER — TRANEXAMIC ACID-NACL 1000-0.7 MG/100ML-% IV SOLN
INTRAVENOUS | Status: DC | PRN
  Administered 2023-09-11: 1000 mg via INTRAVENOUS

## 2023-09-11 MED ORDER — CEFAZOLIN SODIUM-DEXTROSE 2-4 GM/100ML-% IV SOLN
INTRAVENOUS | Status: AC
  Filled 2023-09-11: qty 100

## 2023-09-11 MED ORDER — FENTANYL CITRATE (PF) 100 MCG/2ML IJ SOLN
INTRAMUSCULAR | Status: DC | PRN
  Administered 2023-09-11: 50 ug via INTRAVENOUS
  Administered 2023-09-11 (×2): 25 ug via INTRAVENOUS

## 2023-09-11 MED ORDER — ROCURONIUM BROMIDE 10 MG/ML (PF) SYRINGE
PREFILLED_SYRINGE | INTRAVENOUS | Status: AC
  Filled 2023-09-11: qty 10

## 2023-09-11 MED ORDER — MIDAZOLAM HCL 2 MG/2ML IJ SOLN: 1.0000 mg | INTRAMUSCULAR | Status: DC | PRN

## 2023-09-11 MED ORDER — FLEET ENEMA RE ENEM: 1.0000 | ENEMA | Freq: Once | RECTAL | Status: DC | PRN

## 2023-09-11 MED ORDER — LACTATED RINGERS IV SOLN: INTRAVENOUS | Status: DC

## 2023-09-11 MED ORDER — PHENYLEPHRINE HCL-NACL 20-0.9 MG/250ML-% IV SOLN
INTRAVENOUS | Status: DC | PRN
  Administered 2023-09-11: 20 ug/min via INTRAVENOUS

## 2023-09-11 MED ORDER — ORAL CARE MOUTH RINSE
15.0000 mL | Freq: Once | OROMUCOSAL | Status: AC
Start: 2023-09-11 — End: 2023-09-11

## 2023-09-11 MED ORDER — HYDROCODONE-ACETAMINOPHEN 5-325 MG PO TABS
ORAL_TABLET | ORAL | Status: AC
  Filled 2023-09-11: qty 1

## 2023-09-11 MED ORDER — TRANEXAMIC ACID-NACL 1000-0.7 MG/100ML-% IV SOLN: 1000.0000 mg | INTRAVENOUS | Status: DC

## 2023-09-11 MED ORDER — ACETAMINOPHEN 325 MG PO TABS: 325.0000 mg | ORAL_TABLET | Freq: Four times a day (QID) | ORAL | Status: DC | PRN

## 2023-09-11 MED ORDER — CEFAZOLIN SODIUM-DEXTROSE 2-4 GM/100ML-% IV SOLN
2.0000 g | INTRAVENOUS | Status: AC
  Administered 2023-09-11: 2 g via INTRAVENOUS

## 2023-09-11 MED ORDER — METOCLOPRAMIDE HCL 10 MG PO TABS: 5.0000 mg | ORAL_TABLET | Freq: Three times a day (TID) | ORAL | Status: DC | PRN

## 2023-09-11 MED ORDER — PROPOFOL 10 MG/ML IV BOLUS
INTRAVENOUS | Status: AC
  Filled 2023-09-11: qty 20

## 2023-09-11 MED ORDER — SODIUM CHLORIDE 0.9 % IV SOLN: INTRAVENOUS | Status: AC

## 2023-09-11 MED ORDER — ACETAMINOPHEN 500 MG PO TABS
500.0000 mg | ORAL_TABLET | Freq: Four times a day (QID) | ORAL | Status: AC
  Administered 2023-09-11 – 2023-09-12 (×4): 500 mg via ORAL
  Filled 2023-09-11 (×2): qty 1

## 2023-09-11 MED ORDER — PROPOFOL 10 MG/ML IV BOLUS
INTRAVENOUS | Status: DC | PRN
  Administered 2023-09-11: 75 ug/kg/min via INTRAVENOUS
  Administered 2023-09-11: 100 mg via INTRAVENOUS
  Administered 2023-09-11: 20 mg via INTRAVENOUS
  Administered 2023-09-11: 50 mg via INTRAVENOUS

## 2023-09-11 MED ORDER — BUPIVACAINE-EPINEPHRINE (PF) 0.5% -1:200000 IJ SOLN
INTRAMUSCULAR | Status: AC
  Filled 2023-09-11: qty 30

## 2023-09-11 MED ORDER — ONDANSETRON HCL 4 MG/2ML IJ SOLN
INTRAMUSCULAR | Status: DC | PRN
  Administered 2023-09-11: 4 mg via INTRAVENOUS

## 2023-09-11 MED ORDER — PHENYLEPHRINE 80 MCG/ML (10ML) SYRINGE FOR IV PUSH (FOR BLOOD PRESSURE SUPPORT)
PREFILLED_SYRINGE | INTRAVENOUS | Status: DC | PRN
  Administered 2023-09-11 (×2): 80 ug via INTRAVENOUS

## 2023-09-11 MED ORDER — SUGAMMADEX SODIUM 200 MG/2ML IV SOLN
INTRAVENOUS | Status: DC | PRN
  Administered 2023-09-11: 200 mg via INTRAVENOUS

## 2023-09-11 MED ORDER — MAGNESIUM GLUCONATE 500 (27 MG) MG PO TABS
500.0000 mg | ORAL_TABLET | Freq: Every day | ORAL | Status: DC
  Administered 2023-09-12 – 2023-09-13 (×2): 500 mg via ORAL
  Filled 2023-09-11 (×3): qty 1

## 2023-09-11 MED ORDER — METOCLOPRAMIDE HCL 5 MG/ML IJ SOLN: 5.0000 mg | Freq: Three times a day (TID) | INTRAMUSCULAR | Status: DC | PRN

## 2023-09-11 MED ORDER — VITAMIN D 25 MCG (1000 UNIT) PO TABS
1000.0000 [IU] | ORAL_TABLET | Freq: Every day | ORAL | Status: DC
  Administered 2023-09-11 – 2023-09-13 (×3): 1000 [IU] via ORAL
  Filled 2023-09-11 (×3): qty 1

## 2023-09-11 MED ORDER — DEXMEDETOMIDINE HCL IN NACL 80 MCG/20ML IV SOLN
INTRAVENOUS | Status: DC | PRN
  Administered 2023-09-11 (×2): 4 ug via INTRAVENOUS
  Administered 2023-09-11: 8 ug via INTRAVENOUS

## 2023-09-11 MED ORDER — LIDOCAINE HCL (PF) 1 % IJ SOLN
INTRAMUSCULAR | Status: DC | PRN
  Administered 2023-09-11: 3 mL via SUBCUTANEOUS

## 2023-09-11 MED ORDER — HYDROCODONE-ACETAMINOPHEN 5-325 MG PO TABS
1.0000 | ORAL_TABLET | ORAL | Status: DC | PRN
  Administered 2023-09-11: 1 via ORAL

## 2023-09-11 MED ORDER — LIDOCAINE HCL (CARDIAC) PF 100 MG/5ML IV SOSY
PREFILLED_SYRINGE | INTRAVENOUS | Status: DC | PRN
  Administered 2023-09-11: 50 mg via INTRAVENOUS

## 2023-09-11 MED ORDER — OYSTER SHELL CALCIUM/D3 500-5 MG-MCG PO TABS
1.0000 | ORAL_TABLET | Freq: Every day | ORAL | Status: DC
  Administered 2023-09-12 – 2023-09-13 (×2): 1 via ORAL
  Filled 2023-09-11 (×3): qty 1

## 2023-09-11 MED ORDER — ENOXAPARIN SODIUM 40 MG/0.4ML IJ SOSY
40.0000 mg | PREFILLED_SYRINGE | INTRAMUSCULAR | Status: DC
  Administered 2023-09-12 – 2023-09-13 (×2): 40 mg via SUBCUTANEOUS
  Filled 2023-09-11 (×2): qty 0.4

## 2023-09-11 MED ORDER — TRANEXAMIC ACID-NACL 1000-0.7 MG/100ML-% IV SOLN
INTRAVENOUS | Status: AC
  Filled 2023-09-11: qty 100

## 2023-09-11 MED ORDER — PROPOFOL 1000 MG/100ML IV EMUL
INTRAVENOUS | Status: AC
  Filled 2023-09-11: qty 100

## 2023-09-11 MED ORDER — BUPIVACAINE HCL (PF) 0.5 % IJ SOLN
INTRAMUSCULAR | Status: DC | PRN
  Administered 2023-09-11: 10 mL via PERINEURAL

## 2023-09-11 MED ORDER — DOCUSATE SODIUM 100 MG PO CAPS
100.0000 mg | ORAL_CAPSULE | Freq: Two times a day (BID) | ORAL | Status: DC
  Administered 2023-09-11 – 2023-09-13 (×3): 100 mg via ORAL
  Filled 2023-09-11 (×3): qty 1

## 2023-09-11 MED ORDER — BUPIVACAINE-EPINEPHRINE (PF) 0.5% -1:200000 IJ SOLN
INTRAMUSCULAR | Status: DC | PRN
  Administered 2023-09-11: 30 mL

## 2023-09-11 MED ORDER — DIPHENHYDRAMINE HCL 12.5 MG/5ML PO ELIX: 12.5000 mg | ORAL_SOLUTION | ORAL | Status: DC | PRN

## 2023-09-11 MED ORDER — BUPIVACAINE LIPOSOME 1.3 % IJ SUSP
INTRAMUSCULAR | Status: DC | PRN
  Administered 2023-09-11: 20 mL via PERINEURAL

## 2023-09-11 MED ORDER — ONDANSETRON HCL 4 MG/2ML IJ SOLN: 4.0000 mg | Freq: Four times a day (QID) | INTRAMUSCULAR | Status: DC | PRN

## 2023-09-11 MED ORDER — CHLORHEXIDINE GLUCONATE 0.12 % MT SOLN
15.0000 mL | Freq: Once | OROMUCOSAL | Status: AC
  Administered 2023-09-11: 15 mL via OROMUCOSAL

## 2023-09-11 MED ORDER — ROCURONIUM BROMIDE 100 MG/10ML IV SOLN
INTRAVENOUS | Status: DC | PRN
  Administered 2023-09-11: 40 mg via INTRAVENOUS
  Administered 2023-09-11: 10 mg via INTRAVENOUS

## 2023-09-11 MED ORDER — CEFAZOLIN SODIUM-DEXTROSE 2-4 GM/100ML-% IV SOLN
2.0000 g | Freq: Four times a day (QID) | INTRAVENOUS | Status: AC
  Administered 2023-09-11 (×2): 2 g via INTRAVENOUS

## 2023-09-11 MED ORDER — DEXAMETHASONE SODIUM PHOSPHATE 10 MG/ML IJ SOLN
INTRAMUSCULAR | Status: AC
Start: 2023-09-11 — End: 2023-09-11
  Filled 2023-09-11: qty 1

## 2023-09-11 MED ORDER — CHLORHEXIDINE GLUCONATE 0.12 % MT SOLN
OROMUCOSAL | Status: AC
  Filled 2023-09-11: qty 15

## 2023-09-11 MED ORDER — FENTANYL CITRATE PF 50 MCG/ML IJ SOSY
PREFILLED_SYRINGE | INTRAMUSCULAR | Status: AC
  Filled 2023-09-11: qty 1

## 2023-09-11 MED ORDER — CARVEDILOL 3.125 MG PO TABS
6.2500 mg | ORAL_TABLET | Freq: Two times a day (BID) | ORAL | Status: DC
  Administered 2023-09-11 – 2023-09-13 (×4): 6.25 mg via ORAL
  Filled 2023-09-11 (×4): qty 2

## 2023-09-11 MED ORDER — PANTOPRAZOLE SODIUM 40 MG PO TBEC
40.0000 mg | DELAYED_RELEASE_TABLET | Freq: Every day | ORAL | Status: DC
  Administered 2023-09-11 – 2023-09-13 (×3): 40 mg via ORAL
  Filled 2023-09-11 (×3): qty 1

## 2023-09-11 MED ORDER — FENTANYL CITRATE (PF) 100 MCG/2ML IJ SOLN: 25.0000 ug | INTRAMUSCULAR | Status: DC | PRN

## 2023-09-11 MED ORDER — ONDANSETRON HCL 4 MG PO TABS: 4.0000 mg | ORAL_TABLET | Freq: Four times a day (QID) | ORAL | Status: DC | PRN

## 2023-09-11 MED ORDER — BUPIVACAINE HCL (PF) 0.5 % IJ SOLN
INTRAMUSCULAR | Status: AC
  Filled 2023-09-11: qty 10

## 2023-09-11 MED ORDER — LISINOPRIL 20 MG PO TABS
40.0000 mg | ORAL_TABLET | Freq: Every day | ORAL | Status: DC
  Administered 2023-09-12 – 2023-09-13 (×2): 40 mg via ORAL

## 2023-09-11 MED ORDER — DEXAMETHASONE SODIUM PHOSPHATE 10 MG/ML IJ SOLN
INTRAMUSCULAR | Status: DC | PRN
  Administered 2023-09-11: 10 mg via INTRAVENOUS

## 2023-09-11 MED ORDER — FENTANYL CITRATE PF 50 MCG/ML IJ SOSY
50.0000 ug | PREFILLED_SYRINGE | Freq: Once | INTRAMUSCULAR | Status: AC
  Administered 2023-09-11: 50 ug via INTRAVENOUS

## 2023-09-11 MED ORDER — GLYCOPYRROLATE 0.2 MG/ML IJ SOLN
INTRAMUSCULAR | Status: DC | PRN
  Administered 2023-09-11 (×2): .1 mg via INTRAVENOUS

## 2023-09-11 MED ORDER — MAGNESIUM HYDROXIDE 400 MG/5ML PO SUSP
30.0000 mL | Freq: Every day | ORAL | Status: DC | PRN
  Filled 2023-09-11: qty 30

## 2023-09-11 MED ORDER — FENTANYL CITRATE (PF) 100 MCG/2ML IJ SOLN
INTRAMUSCULAR | Status: AC
Start: 2023-09-11 — End: 2023-09-11
  Filled 2023-09-11: qty 2

## 2023-09-11 MED ORDER — ACETAMINOPHEN 500 MG PO TABS
ORAL_TABLET | ORAL | Status: AC
  Filled 2023-09-11: qty 2

## 2023-09-11 MED ORDER — GLYCOPYRROLATE 0.2 MG/ML IJ SOLN
INTRAMUSCULAR | Status: AC
  Filled 2023-09-11: qty 1

## 2023-09-11 MED ORDER — LIDOCAINE HCL (PF) 1 % IJ SOLN
INTRAMUSCULAR | Status: AC
Start: 2023-09-11 — End: 2023-09-11
  Filled 2023-09-11: qty 5

## 2023-09-11 MED ORDER — SPIRONOLACTONE 25 MG PO TABS
25.0000 mg | ORAL_TABLET | Freq: Every day | ORAL | Status: DC
  Administered 2023-09-11 – 2023-09-13 (×3): 25 mg via ORAL
  Filled 2023-09-11 (×4): qty 1

## 2023-09-11 MED ORDER — BUPIVACAINE LIPOSOME 1.3 % IJ SUSP
INTRAMUSCULAR | Status: AC
Start: 2023-09-11 — End: 2023-09-11
  Filled 2023-09-11: qty 20

## 2023-09-11 MED ORDER — PHENYLEPHRINE HCL-NACL 20-0.9 MG/250ML-% IV SOLN
INTRAVENOUS | Status: AC
  Filled 2023-09-11: qty 250

## 2023-09-11 MED ORDER — DROPERIDOL 2.5 MG/ML IJ SOLN: 0.6250 mg | Freq: Once | INTRAMUSCULAR | Status: DC | PRN

## 2023-09-11 MED ORDER — SODIUM CHLORIDE 0.9 % IR SOLN
Status: DC | PRN
  Administered 2023-09-11: 3000 mL

## 2023-09-11 MED ORDER — BISACODYL 10 MG RE SUPP: 10.0000 mg | Freq: Every day | RECTAL | Status: DC | PRN

## 2023-09-11 SURGICAL SUPPLY — 61 items
BASEPLATE BOSS DRILL (MISCELLANEOUS) IMPLANT
BIT DRILL 2.5X4.5XSCR (BIT) IMPLANT
BIT DRILL F/BASEPLATE CENTRAL (BIT) IMPLANT
BLADE SAW SAG 25X90X1.19 (BLADE) ×1 IMPLANT
CHLORAPREP W/TINT 26 (MISCELLANEOUS) ×1 IMPLANT
COOLER POLAR GLACIER W/PUMP (MISCELLANEOUS) ×1 IMPLANT
DRAPE INCISE IOBAN 66X45 STRL (DRAPES) ×1 IMPLANT
DRAPE SHEET LG 3/4 BI-LAMINATE (DRAPES) ×1 IMPLANT
DRAPE TABLE BACK 80X90 (DRAPES) ×1 IMPLANT
DRSG OPSITE POSTOP 4X8 (GAUZE/BANDAGES/DRESSINGS) ×1 IMPLANT
ELECT CAUTERY BLADE 6.4 (BLADE) ×1 IMPLANT
ELECTRODE BLDE 4.0 EZ CLN MEGD (MISCELLANEOUS) ×1 IMPLANT
ELECTRODE REM PT RTRN 9FT ADLT (ELECTROSURGICAL) ×1 IMPLANT
GAUZE XEROFORM 1X8 LF (GAUZE/BANDAGES/DRESSINGS) ×1 IMPLANT
GLENOSPHERE RSS CONCENTRIC-S 5 (Shoulder) IMPLANT
GLOVE BIO SURGEON STRL SZ7.5 (GLOVE) ×4 IMPLANT
GLOVE BIO SURGEON STRL SZ8 (GLOVE) ×4 IMPLANT
GLOVE BIOGEL PI IND STRL 8 (GLOVE) ×2 IMPLANT
GLOVE INDICATOR 8.0 STRL GRN (GLOVE) ×1 IMPLANT
GOWN STRL REUS W/ TWL LRG LVL3 (GOWN DISPOSABLE) ×2 IMPLANT
GOWN STRL REUS W/ TWL XL LVL3 (GOWN DISPOSABLE) ×1 IMPLANT
GUIDE PIN 2.0 X 150 (WIRE) IMPLANT
HANDLE YANKAUER SUCT OPEN TIP (MISCELLANEOUS) ×1 IMPLANT
HOOD PEEL AWAY T7 (MISCELLANEOUS) ×3 IMPLANT
KIT STABILIZATION SHOULDER (MISCELLANEOUS) ×1 IMPLANT
KIT TURNOVER KIT A (KITS) ×1 IMPLANT
LINER STD +3S RSS HXL (Liner) IMPLANT
MANIFOLD NEPTUNE II (INSTRUMENTS) ×1 IMPLANT
MASK FACE SPIDER DISP (MASK) ×1 IMPLANT
MAT ABSORB FLUID 56X50 GRAY (MISCELLANEOUS) ×1 IMPLANT
NDL MAYO CATGUT SZ1 (NEEDLE) IMPLANT
NDL SAFETY ECLIPSE 18X1.5 (NEEDLE) ×1 IMPLANT
NDL SPNL 20GX3.5 QUINCKE YW (NEEDLE) ×1 IMPLANT
NEEDLE MAYO CATGUT SZ1 (NEEDLE) IMPLANT
NEEDLE SPNL 20GX3.5 QUINCKE YW (NEEDLE) ×1 IMPLANT
PACK ARTHROSCOPY SHOULDER (MISCELLANEOUS) ×1 IMPLANT
PAD ARMBOARD POSITIONER FOAM (MISCELLANEOUS) ×1 IMPLANT
PAD WRAPON POLAR SHDR UNIV (MISCELLANEOUS) ×1 IMPLANT
PENCIL SMOKE EVACUATOR (MISCELLANEOUS) ×1 IMPLANT
PLATE BASE REVERSE RSS S (Plate) IMPLANT
SCREW 4.5X15 RSS W CAP (Screw) IMPLANT
SCREW 4.5X25 RSS W CAP (Screw) IMPLANT
SCREW 4.5X30 RSS W CAP (Screw) IMPLANT
SCREW BODY REVERSE STD (Screw) IMPLANT
SCREW STAR RSS W/CAP 4.5X45 (Screw) IMPLANT
SLING ULTRA II LG (MISCELLANEOUS) IMPLANT
SLING ULTRA II M (MISCELLANEOUS) IMPLANT
SOL .9 NS 3000ML IRR UROMATIC (IV SOLUTION) ×1 IMPLANT
SPONGE T-LAP 18X18 ~~LOC~~+RFID (SPONGE) ×2 IMPLANT
STAPLER SKIN PROX 35W (STAPLE) ×1 IMPLANT
STEM HUM 11 (Stem) IMPLANT
SUT ETHIBOND 0 MO6 C/R (SUTURE) ×1 IMPLANT
SUT VIC AB 0 CT1 36 (SUTURE) ×1 IMPLANT
SUT VIC AB 2-0 CT1 TAPERPNT 27 (SUTURE) ×2 IMPLANT
SUTURE FIBERWR #2 38 BLUE 1/2 (SUTURE) ×4 IMPLANT
SYR 10ML LL (SYRINGE) ×1 IMPLANT
SYR 30ML LL (SYRINGE) ×1 IMPLANT
SYR TOOMEY 50ML (SYRINGE) ×1 IMPLANT
TIP FAN IRRIG PULSAVAC PLUS (DISPOSABLE) ×1 IMPLANT
TRAP FLUID SMOKE EVACUATOR (MISCELLANEOUS) ×1 IMPLANT
WATER STERILE IRR 500ML POUR (IV SOLUTION) ×1 IMPLANT

## 2023-09-11 NOTE — Progress Notes (Signed)
 Occupational Therapy Evaluation Patient Details Name: Allison Hall MRN: [id number] DOB: [birth year] Today's Date: 09/11/2023   History of Present Illness   Pt is a 83 y.o. 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     Clinical Impressions Allison Hall was seen for OT evaluation this date. Prior to hospital admission, pt was IND in ADLs. Pt lives with family.  Pt currently requires MOD A for donning underwear; MAX A for UB dressing. Pt and family instructed in polar care mgt, sling/immobilizer mgt, ROM exercises for RUE (with instructions for no shoulder exercises until full sensation has returned), RUE precautions, adaptive strategies for bathing/dressing/toileting/grooming, positioning and considerations for sleep. Handout provided. Anticipate the need for follow up OT services upon acute hospital DC.      If plan is discharge home, recommend the following:   A little help with walking and/or transfers;A lot of help with bathing/dressing/bathroom;Assistance with cooking/housework;Assist for transportation     Functional Status Assessment   Patient has had a recent decline in their functional status and demonstrates the ability to make significant improvements in function in a reasonable and predictable amount of time.     Equipment Recommendations   None recommended by OT     Recommendations for Other Services         Precautions/Restrictions   Precautions Precautions: Shoulder Type of Shoulder Precautions: Reverse TSA Recall of Precautions/Restrictions: Intact Restrictions Weight Bearing Restrictions Per Provider Order: Yes RUE Weight Bearing Per Provider Order: Non weight bearing     Mobility Bed Mobility                    Transfers Overall transfer level: Needs assistance Equipment used: None Transfers: Sit to/from Stand Sit to Stand: Min assist                  Balance Overall balance assessment: Needs  assistance Sitting-balance support: Feet supported Sitting balance-Leahy Scale: Fair     Standing balance support: During functional activity, No upper extremity supported Standing balance-Leahy Scale: Fair                             ADL either performed or assessed with clinical judgement   ADL Overall ADL's : Needs assistance/impaired                                       General ADL Comments: MOD A for donning underwear; MAX A for UB dressing     Vision         Perception         Praxis         Pertinent Vitals/Pain Pain Assessment Pain Assessment: No/denies pain     Extremity/Trunk Assessment Upper Extremity Assessment Upper Extremity Assessment: Overall WFL for tasks assessed;Right hand dominant;RUE deficits/detail RUE: Unable to fully assess due to immobilization   Lower Extremity Assessment Lower Extremity Assessment: Overall WFL for tasks assessed   Cervical / Trunk Assessment Cervical / Trunk Assessment: Normal   Communication Communication Communication: No apparent difficulties   Cognition Arousal: Alert Behavior During Therapy: WFL for tasks assessed/performed Cognition: No apparent impairments                               Following commands: Intact  Cueing  General Comments   Cueing Techniques: Verbal cues      Exercises     Shoulder Instructions      Home Living Family/patient expects to be discharged to:: Private residence Living Arrangements: Children                     Bathroom Toilet: Handicapped height         Additional Comments: daughter works during the day and can not help pt      Prior Functioning/Environment Prior Level of Function : Independent/Modified Independent             Mobility Comments: Mod-I with SPC with household and short community distances ADLs Comments: Indep    OT Problem List: Decreased range of motion;Impaired UE functional  use   OT Treatment/Interventions:        OT Goals(Current goals can be found in the care plan section)   Acute Rehab OT Goals Patient Stated Goal: to go to rehab OT Goal Formulation: With patient Time For Goal Achievement: 09/11/23 Potential to Achieve Goals: Good ADL Goals Pt Will Perform Grooming: with set-up;with supervision;standing Pt Will Perform Upper Body Dressing: with min assist;with caregiver independent in assisting;sitting Pt Will Perform Lower Body Dressing: with min assist;with caregiver independent in assisting;with adaptive equipment;sit to/from stand Pt Will Transfer to Toilet: with modified independence;ambulating;regular height toilet   OT Frequency:       Co-evaluation              AM-PAC OT 6 Clicks Daily Activity     Outcome Measure Help from another person eating meals?: A Little Help from another person taking care of personal grooming?: A Little Help from another person toileting, which includes using toliet, bedpan, or urinal?: A Little Help from another person bathing (including washing, rinsing, drying)?: A Lot Help from another person to put on and taking off regular upper body clothing?: A Lot Help from another person to put on and taking off regular lower body clothing?: A Little 6 Click Score: 16   End of Session    Activity Tolerance: Patient tolerated treatment well Patient left: in chair;with call bell/phone within reach;with family/visitor present  OT Visit Diagnosis: Other abnormalities of gait and mobility (R26.89)                Time: 8413-2440 OT Time Calculation (min): 44 min Charges:  OT General Charges $OT Visit: 1 Visit OT Evaluation $OT Eval Low Complexity: 1 Low OT Treatments $Self Care/Home Management : 23-37 mins  Stevenson Elbe, Student OT   [employer] 09/11/2023, 4:29 PM

## 2023-09-11 NOTE — Evaluation (Signed)
 Physical Therapy Evaluation Patient Details Name: ARTESHA WEMHOFF MRN: [id number] DOB: [birth year] Today's Date: 09/11/2023  History of Present Illness  Pt is a 83 y.o. 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  Clinical Impression  Pt admitted with above diagnosis. Pt currently with functional limitations due to the deficits listed below (see PT Problem List). Pt received upright in bed agreeable to PT. PTA reports being indep with ADL's and mod-I with gait using SPC.   To date, began session with education on shoulder immobilizer. Pt reliant on bed elevation, minA, and bed rail to transfer supine to sit EOB. Pt also reliant on minA and HHA+1 to stand then CGA for SPT to recliner. Pt left in care of OT for evaluation. Pt with limited support at home for needed transfers, gait, and ADL's thus would benefit from skilled PT services < 3 hours/day to address acute, surgical deficits.      If plan is discharge home, recommend the following: A little help with walking and/or transfers;A little help with bathing/dressing/bathroom;Assist for transportation;Help with stairs or ramp for entrance   Can travel by private vehicle   Yes    Equipment Recommendations Other (comment) (TBD by next venue of care)  Recommendations for Other Services       Functional Status Assessment Patient has had a recent decline in their functional status and demonstrates the ability to make significant improvements in function in a reasonable and predictable amount of time.     Precautions / Restrictions Precautions Precautions: Shoulder Type of Shoulder Precautions: Reverse TSA Shoulder Interventions: Shoulder sling/immobilizer;Off for dressing/bathing/exercises Recall of Precautions/Restrictions: Intact Restrictions Weight Bearing Restrictions Per Provider Order: Yes RUE Weight Bearing Per Provider Order: Non weight bearing      Mobility  Bed Mobility Overal bed mobility: Needs  Assistance Bed Mobility: Supine to Sit     Supine to sit: Min assist, HOB elevated, Used rails     General bed mobility comments: MinA at torso and use of bed rail with LUE to assist Patient Response: Cooperative  Transfers Overall transfer level: Needs assistance Equipment used: 1 person hand held assist Transfers: Bed to chair/wheelchair/BSC     Step pivot transfers: Contact guard assist       General transfer comment: HHA+1 with transfer    Ambulation/Gait                  Stairs            Wheelchair Mobility     Tilt Bed Tilt Bed Patient Response: Cooperative  Modified Rankin (Stroke Patients Only)       Balance Overall balance assessment: Needs assistance Sitting-balance support: No upper extremity supported, Feet supported Sitting balance-Leahy Scale: Fair       Standing balance-Leahy Scale: Fair                               Pertinent Vitals/Pain Pain Assessment Pain Assessment: No/denies pain    Home Living Family/patient expects to be discharged to:: Skilled nursing facility Living Arrangements: Children                 Additional Comments: daughter works during the day and can not help pt    Prior Function Prior Level of Function : Independent/Modified Independent             Mobility Comments: Mod-I with SPC with household and short community distances ADLs Comments: Indep  Extremity/Trunk Assessment   Upper Extremity Assessment Upper Extremity Assessment: Defer to OT evaluation    Lower Extremity Assessment Lower Extremity Assessment: Overall WFL for tasks assessed    Cervical / Trunk Assessment Cervical / Trunk Assessment: Normal  Communication   Communication Communication: No apparent difficulties    Cognition Arousal: Alert Behavior During Therapy: WFL for tasks assessed/performed   PT - Cognitive impairments: No apparent impairments                          Following commands: Intact       Cueing Cueing Techniques: Verbal cues     General Comments      Exercises Other Exercises Other Exercises: discussed shoulder immobilizer and when appropriate to remove (bathing/dressing/exercises)   Assessment/Plan    PT Assessment Patient needs continued PT services  PT Problem List Decreased strength;Decreased mobility;Decreased activity tolerance;Decreased balance       PT Treatment Interventions DME instruction;Therapeutic exercise;Gait training;Balance training;Stair training;Neuromuscular re-education;Functional mobility training;Therapeutic activities;Patient/family education    PT Goals (Current goals can be found in the Care Plan section)  Acute Rehab PT Goals Patient Stated Goal: to go to rehab PT Goal Formulation: With patient Time For Goal Achievement: 09/25/23 Potential to Achieve Goals: Good    Frequency 7X/week     Co-evaluation               AM-PAC PT 6 Clicks Mobility  Outcome Measure Help needed turning from your back to your side while in a flat bed without using bedrails?: A Lot Help needed moving from lying on your back to sitting on the side of a flat bed without using bedrails?: A Lot Help needed moving to and from a bed to a chair (including a wheelchair)?: A Lot Help needed standing up from a chair using your arms (e.g., wheelchair or bedside chair)?: A Little Help needed to walk in hospital room?: A Lot Help needed climbing 3-5 steps with a railing? : A Lot 6 Click Score: 13    End of Session Equipment Utilized During Treatment: Gait belt Activity Tolerance: Patient tolerated treatment well Patient left: in chair;with call bell/phone within reach (in care of OT) Nurse Communication: Mobility status PT Visit Diagnosis: Muscle weakness (generalized) (M62.81);Difficulty in walking, not elsewhere classified (R26.2)    Time: 4401-0272 PT Time Calculation (min) (ACUTE ONLY): 19 min   Charges:   PT  Evaluation $PT Eval Low Complexity: 1 Low   PT General Charges $$ ACUTE PT VISIT: 1 Visit         Marc Senior. Fairly IV, PT, DPT Physical Therapist- [place]  [facility] 09/11/2023, 4:07 PM
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
## 2023-09-11 NOTE — Anesthesia Procedure Notes (Signed)
 Anesthesia Regional Block: Interscalene brachial plexus block   Pre-Anesthetic Checklist: , timeout performed,  Correct Patient, Correct Site, Correct Laterality,  Correct Procedure, Correct Position, site marked,  Risks and benefits discussed,  Surgical consent,  Pre-op evaluation,  At surgeon's request and post-op pain management  Laterality: Right and Upper  Prep: chloraprep       Needles:  Injection technique: Single-shot  Needle Type: Stimiplex     Needle Length: 5cm  Needle Gauge: 22     Additional Needles:   Procedures:, nerve stimulator,,, ultrasound used (permanent image in chart),,     Nerve Stimulator or Paresthesia:  Response: biceps flexion  Additional Responses:   Narrative:  Start time: 09/11/2023 10:11 AM End time: 09/11/2023 10:14 AM Injection made incrementally with aspirations every 5 mL.  Performed by: Personally  Anesthesiologist: Vanice Genre, MD  Additional Notes: Functioning IV was confirmed and monitors were applied.  A 50mm 22ga Stimuplex needle was used. Sterile prep and drape,hand hygiene and sterile gloves were used.  Negative aspiration and negative test dose prior to incremental administration of local anesthetic. The patient tolerated the procedure well.

## 2023-09-11 NOTE — Anesthesia Procedure Notes (Signed)
 Procedure Name: Intubation Date/Time: 09/11/2023 10:37 AM  Performed by: Marisue Side, CRNAPre-anesthesia Checklist: Patient identified, Patient being monitored, Timeout performed, Emergency Drugs available and Suction available Patient Re-evaluated:Patient Re-evaluated prior to induction Oxygen Delivery Method: Circle system utilized Preoxygenation: Pre-oxygenation with 100% oxygen Induction Type: IV induction Ventilation: Mask ventilation without difficulty Laryngoscope Size: Miller and 2 Grade View: Grade I Tube type: Oral Tube size: 7.0 mm Number of attempts: 1 Airway Equipment and Method: Stylet Placement Confirmation: ETT inserted through vocal cords under direct vision, positive ETCO2 and breath sounds checked- equal and bilateral Secured at: 21 cm Tube secured with: Tape Dental Injury: Teeth and Oropharynx as per pre-operative assessment

## 2023-09-11 NOTE — Plan of Care (Signed)
Patient verbalizes understanding of care plan

## 2023-09-11 NOTE — Anesthesia Preprocedure Evaluation (Signed)
 Anesthesia Evaluation  Patient identified by MRN, date of birth, ID band Patient awake    Reviewed: Allergy & Precautions, H&P , NPO status , Patient's Chart, lab work & pertinent test results, reviewed documented beta blocker date and time   History of Anesthesia Complications Negative for: history of anesthetic complications  Airway Mallampati: III  TM Distance: >3 FB Neck ROM: full    Dental  (+) Caps, Chipped, Poor Dentition, Dental Advidsory Given   Pulmonary neg pulmonary ROS, former smoker          Cardiovascular Exercise Tolerance: Good hypertension, (-) angina (-) CAD, (-) Past MI, (-) Cardiac Stents and (-) CABG negative cardio ROS (-) dysrhythmias (-) Valvular Problems/Murmurs     Neuro/Psych  PSYCHIATRIC DISORDERS Anxiety Depression    negative neurological ROS     GI/Hepatic Neg liver ROS,GERD  ,,  Endo/Other  negative endocrine ROS    Renal/GU CRFRenal disease  negative genitourinary   Musculoskeletal   Abdominal   Peds  Hematology negative hematology ROS (+)   Anesthesia Other Findings Past Medical History: No date: Anxiety No date: Cancer (HCC)     Comment: skin ca No date: Colon polyp No date: Depression No date: GERD (gastroesophageal reflux disease) No date: Hypercholesteremia No date: Hypertension No date: Osteoarthritis   Reproductive/Obstetrics negative OB ROS                             Anesthesia Physical Anesthesia Plan  ASA: III  Anesthesia Plan: General   Post-op Pain Management: Regional block*   Induction: Intravenous  PONV Risk Score and Plan: 3 and Treatment may vary due to age or medical condition, Propofol  infusion and TIVA  Airway Management Planned: Oral ETT  Additional Equipment:   Intra-op Plan:   Post-operative Plan: Extubation in OR  Informed Consent: I have reviewed the patients History and Physical, chart, labs and discussed  the procedure including the risks, benefits and alternatives for the proposed anesthesia with the patient or authorized representative who has indicated his/her understanding and acceptance.     Dental Advisory Given  Plan Discussed with: Anesthesiologist, CRNA and Surgeon  Anesthesia Plan Comments:         Anesthesia Quick Evaluation

## 2023-09-11 NOTE — Op Note (Signed)
 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  Post-Op Diagnosis:   Same.  Procedure:   Reverse right total shoulder arthroplasty with biceps tenodesis.  Surgeon:   Lonnie Roberts, MD  Assistant:   Edie Goon, PA-C; [place] Lee Napolitano, PA-S  Anesthesia:   General endotracheal with an interscalene block using Exparel  placed preoperatively by the anesthesiologist.  Findings:   As above.  Complications:   None  EBL:   75 cc  Fluids:   650 cc crystalloid  UOP:   None  TT:   None  Drains:   None  Closure:   Staples  Implants:   All press-fit Integra system with an 11 mm stem, a standard metaphyseal body, a +3 mm humeral platform, a mini baseplate, and a 38 mm concentric +5 mm laterally offset glenosphere.  Brief Clinical Note:   The patient is a 83 year old female with a long history of progressively worsening right shoulder pain. Her symptoms have progressed despite medications, activity modification, etc. Her history and examination are consistent with advanced degenerative joint disease with cuff arthropathy. The patient presents at this time for a reverse right total shoulder arthroplasty.  Procedure:   The patient underwent placement of an interscalene block using Exparel  by the anesthesiologist in the preoperative holding area before being brought into the operating room and lain in the supine position. The patient then underwent general endotracheal intubation and anesthesia before the patient was repositioned in the beach chair position using the beach chair positioner. The right shoulder and upper extremity were prepped with ChloraPrep solution before being draped sterilely. Preoperative antibiotics were administered.   A timeout was performed to verify the appropriate surgical site before a standard anterior approach to the shoulder was made  through an approximately 4-5 inch incision. The incision was carried down through the subcutaneous tissues to expose the deltopectoral fascia. The interval between the deltoid and pectoralis muscles was identified and this plane developed, retracting the cephalic vein laterally with the deltoid muscle. The conjoined tendon was identified. Its lateral margin was dissected and the Kolbel self-retraining retractor inserted. The three sisters were identified and cauterized. Bursal tissues were removed to improve visualization.   The biceps tendon was identified near the inferior aspect of the bicipital groove. A soft tissue tenodesis was performed by attaching the biceps tendon to the adjacent pectoralis major tendon using two #0 Ethibond interrupted sutures. The biceps tendon was then transected just proximal to the tenodesis site. The subscapularis tendon was released from its attachment to the lesser tuberosity 1 cm proximal to its insertion and several tagging sutures placed. The inferior capsule was released with care after identifying and protecting the axillary nerve. The proximal humeral cut was made at approximately 20 of retroversion using the extra-medullary guide.   Attention was redirected to the glenoid. The labrum was debrided circumferentially before the center of the glenoid was identified. The guidewire was drilled into the glenoid neck using the appropriate guide. After verifying its position, it was overreamed with the mini-baseplate reamer to create a flat surface before the stem reamer was utilized. The superior and inferior peg sites were reamed using the appropriate guide to complete the glenoid preparation. The permanent mini-baseplate was impacted into place. It was stabilized with a 30 x 4.5 mm central screw and four peripheral screws. Locking caps were placed over the superior and inferior screws. The  permanent 38 mm concentric glenosphere with +5 mm of lateral offset was then impacted  into place and its Morse taper locking mechanism verified using manual distraction.  Attention was directed to the humeral side. The humeral canal was prepared utilizing the tapered stem reamers sequentially beginning with the 7 mm stem and progressing to an 11 mm stem. This demonstrated a good tight fit. The metaphyseal region was then prepared using the appropriate planar device. The trial stem and standard metaphyseal body were put together on the back table and a trial reduction performed using the +0 mm and +3 mm inserts. With the +3 mm insert, the arm demonstrated excellent range of motion as the hand could be brought across the chest to the opposite shoulder and brought to the top of the patient's head and to the patient's ear. The shoulder appeared stable throughout this range of motion. The joint was dislocated and the trial components removed.   The permanent 11 mm stem with the standard body was impacted into place with care taken to maintain the appropriate version. A repeat trial reduction with the +3 mm insert again demonstrated excellent stability with the findings as described above. Therefore, the shoulder was re-dislocated and, after inserting the locking screw to secure the body to the stem, the permanent +3 mm insert impacted into place. After verifying its locking mechanism, the shoulder was relocated using two finger pressure and again placed through a range of motion with the findings as described above.  The wound was copiously irrigated with sterile saline solution using the jet lavage system before 30 cc of 0.5% Sensorcaine  with epinephrine  was injected into the pericapsular and peri-incisional tissues to help with postoperative analgesia. The subscapularis tendon was reapproximated using #2 FiberWire interrupted sutures. The deltopectoral interval was closed using #0 Vicryl interrupted sutures before the subcutaneous tissues were closed using 2-0 Vicryl interrupted sutures. The skin  was closed using staples. A sterile occlusive dressing was applied to the wound before the arm was placed into a shoulder immobilizer with an abduction pillow. A Polar Care system also was applied to the shoulder. The patient was then transferred back to a hospital bed before being awakened, extubated, and returned to the recovery room in satisfactory condition after tolerating the procedure well.

## 2023-09-11 NOTE — Transfer of Care (Signed)
 Immediate Anesthesia Transfer of Care Note  Patient: Allison Hall  Procedure(s) Performed: ARTHROPLASTY, SHOULDER, TOTAL, REVERSE (Right: Shoulder) TENODESIS, BICEPS (Right: Shoulder)  Patient Location: PACU  Anesthesia Type:General  Level of Consciousness: awake and oriented  Airway & Oxygen Therapy: Patient Spontanous Breathing and Patient connected to face mask oxygen  Post-op Assessment: Report given to RN, Post -op Vital signs reviewed and stable, and Patient moving all extremities  Post vital signs: Reviewed and stable  Last Vitals:  Vitals Value Taken Time  BP 135/70 09/11/23 13:00  Temp    Pulse 69 09/11/23 13:03  Resp 12 09/11/23 13:03  SpO2 100 % 09/11/23 13:03  Vitals shown include unfiled device data.  Last Pain:  Vitals:   09/11/23 0940  TempSrc: Temporal  PainSc: 0-No pain         Complications: No notable events documented.

## 2023-09-12 ENCOUNTER — Encounter: Payer: Self-pay | Admitting: Surgery

## 2023-09-12 DIAGNOSIS — M75101 Unspecified rotator cuff tear or rupture of right shoulder, not specified as traumatic: Secondary | ICD-10-CM | POA: Diagnosis not present

## 2023-09-12 LAB — BASIC METABOLIC PANEL WITH GFR
Anion gap: 8 (ref 5–15)
BUN: 18 mg/dL (ref 8–23)
CO2: 25 mmol/L (ref 22–32)
Calcium: 9 mg/dL (ref 8.9–10.3)
Chloride: 102 mmol/L (ref 98–111)
Creatinine, Ser: 0.78 mg/dL (ref 0.44–1.00)
GFR, Estimated: >60 mL/min (ref >60)
Glucose, Bld: 152 mg/dL — ABNORMAL HIGH (ref 70–99)
Potassium: 3.7 mmol/L (ref 3.5–5.1)
Sodium: 135 mmol/L (ref 135–145)

## 2023-09-12 LAB — CBC
HCT: 29.6 % — ABNORMAL LOW (ref 36.0–46.0)
Hemoglobin: 9.9 g/dL — ABNORMAL LOW (ref 12.0–15.0)
MCH: 30.7 pg (ref 26.0–34.0)
MCHC: 33.4 g/dL (ref 30.0–36.0)
MCV: 91.6 fL (ref 80.0–100.0)
Platelets: 222 10*3/uL (ref 150–400)
RBC: 3.23 MIL/uL — ABNORMAL LOW (ref 3.87–5.11)
RDW: 14.2 % (ref 11.5–15.5)
WBC: 10.9 10*3/uL — ABNORMAL HIGH (ref 4.0–10.5)
nRBC: 0 % (ref 0.0–0.2)

## 2023-09-12 MED ORDER — CALCIUM CARBONATE ANTACID 500 MG PO CHEW
1.0000 | CHEWABLE_TABLET | Freq: Three times a day (TID) | ORAL | Status: DC | PRN
  Administered 2023-09-12: 200 mg via ORAL
  Filled 2023-09-12: qty 1

## 2023-09-12 MED ORDER — LISINOPRIL 20 MG PO TABS
ORAL_TABLET | ORAL | Status: AC
Start: 2023-09-12 — End: 2023-09-12
  Filled 2023-09-12: qty 2

## 2023-09-12 NOTE — TOC Transition Note (Signed)
 Transition of Care [facility]) - Discharge Note   Patient Details  Name: SARAN LAVIOLETTE MRN: [id number] Date of Birth: [birth year]  [facility]) CM/SW Contact:  Alexandra Ice, RN Phone Number: 09/12/2023, 12:25 PM   Clinical Narrative:    Siegfried Dress pending for [facility], SNF pending #[id number]    Final next level of care: Skilled Nursing Facility Barriers to Discharge: Continued Medical Work up   Patient Goals and CMS Choice Patient states their goals for this hospitalization and ongoing recovery are:: I want to go to SNF, my daughter can't help me at home she works [email or web address] Compare Post Acute Care list provided to:: Patient Choice offered to / list presented to : Patient      Discharge Placement                       Discharge Plan and Services Additional resources added to the After Visit Summary for     Discharge Planning Services: CM Consult Post Acute Care Choice: Skilled Nursing Facility            DME Agency: NA       HH Arranged: NA          Social Drivers of Health (SDOH) Interventions SDOH Screenings   Food Insecurity: No Food Insecurity (09/11/2023)  Housing: Low Risk  (09/11/2023)  Transportation Needs: No Transportation Needs (09/11/2023)  Utilities: Not At Risk (09/11/2023)  Depression (PHQ2-9): Low Risk  (05/14/2018)  Financial Resource Strain: Low Risk  (03/02/2022)   Received from [facility]  Physical Activity: Inactive (12/03/2017)  Social Connections: Unknown (09/11/2023)  Stress: No Stress Concern Present (12/03/2017)  Tobacco Use: Medium Risk (09/11/2023)     Readmission Risk Interventions     No data to display

## 2023-09-12 NOTE — Progress Notes (Signed)
  Subjective: 1 Day Post-Op Procedure(s) (LRB): ARTHROPLASTY, SHOULDER, TOTAL, REVERSE (Right) TENODESIS, BICEPS (Right) Patient reports pain as mild in the right shoulder Patient is well, and has had no acute complaints or problems Plan is to go Skilled nursing facility after hospital stay. Negative for chest pain and shortness of breath Fever: no Gastrointestinal:Negative for nausea and vomiting   Objective: Vital signs in last 24 hours: Temp:  [97.2 F (36.2 C)-98.5 F (36.9 C)] 98.5 F (36.9 C) (06/18 0750) Pulse Rate:  [52-78] 71 (06/18 0750) Resp:  [9-18] 15 (06/18 0750) BP: (119-202)/(56-82) 133/64 (06/18 0750) SpO2:  [97 %-100 %] 98 % (06/18 0750) Weight:  [75.3 kg] 75.3 kg (06/17 0940)  Intake/Output from previous day:  Intake/Output Summary (Last 24 hours) at 09/12/2023 0801 Last data filed at 09/12/2023 0400 Gross per 24 hour  Intake 1353.42 ml  Output 75 ml  Net 1278.42 ml    Intake/Output this shift: No intake/output data recorded.  Labs: Recent Labs    09/12/23 0611  HGB 9.9*   Recent Labs    09/12/23 0611  WBC 10.9*  RBC 3.23*  HCT 29.6*  PLT 222   Recent Labs    09/12/23 0611  NA 135  K 3.7  CL 102  CO2 25  BUN 18  CREATININE 0.78  GLUCOSE 152*  CALCIUM 9.0   No results for input(s): LABPT, INR in the last 72 hours.   EXAM General - Patient is Alert, Appropriate, and Oriented Extremity - Sling intact to the right shoulder. Dressing intact without any drainage. Decreased sensation to the right arm due to recent interscalene block Able to flex and extend fingers without pain Motor Function - intact, decreased dorsiflexion in the right foot, h/o footdrop.  Past Medical History:  Diagnosis Date   Anemia    Anxiety    Breast cancer of upper-outer quadrant of right female breast (HCC) 08/24/2016   Colon polyp    COVID-19 02/14/2020   received treatment   Depression    GERD (gastroesophageal reflux disease)     Hypercholesteremia    Hypertension    Osteoarthritis    Personal history of radiation therapy 2018   RIGHT BREAST CA UOQ   Primary osteoarthritis of right shoulder    Scoliosis of lumbar spine    Skin cancer    Thyroid  nodule 04/17/2016    Assessment/Plan: 1 Day Post-Op Procedure(s) (LRB): ARTHROPLASTY, SHOULDER, TOTAL, REVERSE (Right) TENODESIS, BICEPS (Right) Principal Problem:   Status post reverse total arthroplasty of right shoulder  Estimated body mass index is 28.49 kg/m as calculated from the following:   Height as of this encounter: 5' 4 (1.626 m).   Weight as of this encounter: 75.3 kg. Advance diet Up with therapy D/C IV fluids when tolerating po intake.  Labs and vitals reviewed.  WBC 10.9.  Hg 9.9. Up with therapy today. Patient with history of right foot drop.  Decreased ability to walk at baseline.  Will need SNF due to inability to use to the right arm for ambulation.  Multiple stairs to enter home.  Family around but not able to be with patient full time. Left knee osteoarthritis.  Will plan on performing injection while admitted. Plan for discharge to SNF due to limitations at baseline with ambulation.  DVT Prophylaxis - Lovenox and TED hose NWB to the right arm.  Antoine Bathe, PA-C [facility] Orthopaedic Surgery 09/12/2023, 8:01 AM

## 2023-09-12 NOTE — TOC Progression Note (Signed)
 Transition of Care [facility]) - Progression Note    Patient Details  Name: Allison Hall MRN: [id number] Date of Birth: 1940-09-15  Transition of Care [facility]) CM/SW Contact  Alexandra Ice, RN Phone Number: 09/12/2023, 8:38 AM  Clinical Narrative:     Benjaman Branch pending MD signature.        Expected Discharge Plan and Services                                               Social Determinants of Health (SDOH) Interventions SDOH Screenings   Food Insecurity: No Food Insecurity (09/11/2023)  Housing: Low Risk  (09/11/2023)  Transportation Needs: No Transportation Needs (09/11/2023)  Utilities: Not At Risk (09/11/2023)  Depression (PHQ2-9): Low Risk  (05/14/2018)  Financial Resource Strain: Low Risk  (03/02/2022)   Received from [facility]  Physical Activity: Inactive (12/03/2017)  Social Connections: Unknown (09/11/2023)  Stress: No Stress Concern Present (12/03/2017)  Tobacco Use: Medium Risk (09/11/2023)    Readmission Risk Interventions     No data to display

## 2023-09-12 NOTE — TOC Progression Note (Signed)
 Transition of Care [facility]) - Progression Note    Patient Details  Name: Allison Hall MRN: [id number] Date of Birth: [birth year]  Transition of Care [facility]) CM/SW Contact  Alexandra Ice, RN Phone Number: 09/12/2023, 2:13 PM  Clinical Narrative:    Patient approved for [facility], SNF approved - 09/12/23-09/14/23 auth id [id number]   Expected Discharge Plan: Skilled Nursing Facility Barriers to Discharge: Continued Medical Work up  Expected Discharge Plan and Services   Discharge Planning Services: CM Consult Post Acute Care Choice: Skilled Nursing Facility Living arrangements for the past 2 months: Single Family Home                   DME Agency: NA       HH Arranged: NA           Social Determinants of Health (SDOH) Interventions SDOH Screenings   Food Insecurity: No Food Insecurity (09/11/2023)  Housing: Low Risk  (09/11/2023)  Transportation Needs: No Transportation Needs (09/11/2023)  Utilities: Not At Risk (09/11/2023)  Depression (PHQ2-9): Low Risk  (05/14/2018)  Financial Resource Strain: Low Risk  (03/02/2022)   Received from [facility]  Physical Activity: Inactive (12/03/2017)  Social Connections: Unknown (09/11/2023)  Stress: No Stress Concern Present (12/03/2017)  Tobacco Use: Medium Risk (09/11/2023)    Readmission Risk Interventions     No data to display

## 2023-09-12 NOTE — Anesthesia Postprocedure Evaluation (Signed)
 Anesthesia Post Note  Patient: Allison Hall  Procedure(s) Performed: ARTHROPLASTY, SHOULDER, TOTAL, REVERSE (Right: Shoulder) TENODESIS, BICEPS (Right: Shoulder)  Patient location during evaluation: PACU Anesthesia Type: General Level of consciousness: awake and alert Pain management: pain level controlled Vital Signs Assessment: post-procedure vital signs reviewed and stable Respiratory status: spontaneous breathing, nonlabored ventilation, respiratory function stable and patient connected to nasal cannula oxygen Cardiovascular status: blood pressure returned to baseline and stable Postop Assessment: no apparent nausea or vomiting Anesthetic complications: no   No notable events documented.   Last Vitals:  Vitals:   09/12/23 0431 09/12/23 0750  BP: (!) 128/56 133/64  Pulse: 62 71  Resp: 18 15  Temp: (!) 36.2 C 36.9 C  SpO2: 99% 98%    Last Pain:  Vitals:   09/12/23 0750  TempSrc: Oral  PainSc: 0-No pain                 Vanice Genre

## 2023-09-12 NOTE — Progress Notes (Signed)
 Physical Therapy Treatment Patient Details Name: Allison Hall MRN: [id number] DOB: 12-19-1940 Today's Date: 09/12/2023   History of Present Illness Pt is a 83 y.o. 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    PT Comments  Pt received upright in chair agreeable to PT. Began session adjusting shoulder immobilizer. Discussed/reviewed time pt can remove sling. Discussed correct use of SPC. Pt with L knee pain at baseline and R foot drop. Using SPC in LUE to coordinate with R foot drop for stability. Pt stands minA from recliner needing LUE arm rest to assist. Pt completes 7' mostly CGA with 1 episode needing minA to correct R lateral LOB. Pt generally leans onto RLE to reduce WB on L knee due to chronic knee OA pain. Mostly correct 3 point pattern appreciated ~90% of time but occasionally coordinate [facility] with L foot strike at initial contact needing VC's to correct with good carryover. Pt returns to room reporting need for urination. MinA needed for doffing underwear but pt able to wipe in sitting independently and in standing CGA. MinA to don underwear with return to recliner at [facility]. All needs in reach. D/c recs remain appropriate.    If plan is discharge home, recommend the following: A little help with walking and/or transfers;A little help with bathing/dressing/bathroom;Assist for transportation;Help with stairs or ramp for entrance   Can travel by private vehicle     Yes  Equipment Recommendations       Recommendations for Other Services       Precautions / Restrictions Precautions Precautions: Shoulder Type of Shoulder Precautions: Reverse TSA Shoulder Interventions: Shoulder sling/immobilizer;Off for dressing/bathing/exercises Precaution Booklet Issued: Yes (comment) Recall of Precautions/Restrictions: Intact Restrictions Weight Bearing Restrictions Per Provider Order: Yes RUE Weight Bearing Per Provider Order: Non weight bearing     Mobility  Bed  Mobility Overal bed mobility: Needs Assistance             General bed mobility comments: NT. In recliner pre and post session Patient Response: Cooperative  Transfers Overall transfer level: Needs assistance Equipment used: None Transfers: Sit to/from Stand Sit to Stand: Min assist                Ambulation/Gait Ambulation/Gait assistance: Contact guard assist, Min assist Gait Distance (Feet): 80 Feet Assistive device: Straight cane Gait Pattern/deviations: Step-to pattern, Trunk flexed, Narrow base of support       General Gait Details: pt with 90% consistent/correct use of SPC in LUE to coordinate with R foot drop. Intermittent VC's needed coordinating LUE SPC with R foot initial contact. x1 LOB to the R relying on minA to correct balance loss from fall.   Stairs             Wheelchair Mobility     Tilt Bed Tilt Bed Patient Response: Cooperative  Modified Rankin (Stroke Patients Only)       Balance Overall balance assessment: Needs assistance Sitting-balance support: Feet supported Sitting balance-Leahy Scale: Good     Standing balance support: During functional activity, No upper extremity supported Standing balance-Leahy Scale: Fair Standing balance comment: able to stand and wipe herself at [facility]                            Communication Communication Communication: No apparent difficulties  Cognition Arousal: Alert Behavior During Therapy: WFL for tasks assessed/performed   PT - Cognitive impairments: No apparent impairments  Following commands: Intact      Cueing Cueing Techniques: Verbal cues  Exercises Other Exercises Other Exercises: reviewed use of shoulder immobilizer and correct fit. Discussed cervical AROM to reduce risk of upper trap pain from sling wear    General Comments        Pertinent Vitals/Pain Pain Assessment Pain Assessment: No/denies pain    Home Living                           Prior Function            PT Goals (current goals can now be found in the care plan section) Acute Rehab PT Goals Patient Stated Goal: to go to rehab PT Goal Formulation: With patient Time For Goal Achievement: 09/25/23 Potential to Achieve Goals: Good Progress towards PT goals: Progressing toward goals    Frequency    7X/week      PT Plan      Co-evaluation              AM-PAC PT 6 Clicks Mobility   Outcome Measure  Help needed turning from your back to your side while in a flat bed without using bedrails?: A Lot Help needed moving from lying on your back to sitting on the side of a flat bed without using bedrails?: A Lot Help needed moving to and from a bed to a chair (including a wheelchair)?: A Little Help needed standing up from a chair using your arms (e.g., wheelchair or bedside chair)?: A Little Help needed to walk in hospital room?: A Lot Help needed climbing 3-5 steps with a railing? : A Lot 6 Click Score: 14    End of Session Equipment Utilized During Treatment: Gait belt Activity Tolerance: Patient tolerated treatment well Patient left: in chair;with call bell/phone within reach Nurse Communication: Mobility status PT Visit Diagnosis: Muscle weakness (generalized) (M62.81);Difficulty in walking, not elsewhere classified (R26.2)     Time: 6440-3474 PT Time Calculation (min) (ACUTE ONLY): 24 min  Charges:    $Gait Training: 8-22 mins $Therapeutic Activity: 8-22 mins PT General Charges $$ ACUTE PT VISIT: 1 Visit                    Marc Senior. Fairly IV, PT, DPT Physical Therapist- [place]  [facility]  09/12/2023, 10:34 AM

## 2023-09-12 NOTE — Plan of Care (Signed)

## 2023-09-12 NOTE — Plan of Care (Signed)
DOCUMENTED

## 2023-09-12 NOTE — Progress Notes (Signed)
 Occupational Therapy Treatment Patient Details Name: Allison Hall MRN: [id number] DOB: 21-May-1940 Today's Date: 09/12/2023   History of present illness Pt is a 83 y.o. 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   OT comments  Chart reviewed to date, pt greeted in chair, reporting her RLE is bothering her, impacting her ability to amb. Pt is agreeable to OT tx session targeting improving functional activity tolerance and RUE function in prep for ADL tasks. Pt provided HEP for AAROM, eventual AROM of elbow, forearm, wrist, hand and performs with supervision. CGA for amb in room with SPC, MIN A required for STS. Pt reports she feels fatigued and requests to rest after approx 20'. Pt is left in chair,all needs met. OT will continue to follow.       If plan is discharge home, recommend the following:  A little help with walking and/or transfers;A lot of help with bathing/dressing/bathroom;Assistance with cooking/housework;Assist for transportation   Equipment Recommendations  BSC/3in1    Recommendations for Other Services      Precautions / Restrictions Precautions Precautions: Shoulder Type of Shoulder Precautions: Reverse TSA Shoulder Interventions: Shoulder sling/immobilizer;Off for dressing/bathing/exercises Recall of Precautions/Restrictions: Intact Restrictions Weight Bearing Restrictions Per Provider Order: Yes RUE Weight Bearing Per Provider Order: Non weight bearing       Mobility Bed Mobility               General bed mobility comments: NT in chair pre/post session    Transfers Overall transfer level: Needs assistance Equipment used: Straight cane Transfers: Sit to/from Stand Sit to Stand: Min assist                 Balance Overall balance assessment: Needs assistance Sitting-balance support: Feet supported Sitting balance-Leahy Scale: Good     Standing balance support: During functional activity, No upper extremity  supported Standing balance-Leahy Scale: Fair                             ADL either performed or assessed with clinical judgement   ADL Overall ADL's : Needs assistance/impaired Eating/Feeding: Set up;Sitting               Upper Body Dressing : Maximal assistance;Sitting Upper Body Dressing Details (indicate cue type and reason): adjust sling, step by step cueing provided to explain fit/function     Toilet Transfer: Contact guard assist Toilet Transfer Details (indicate cue type and reason): simulated with SPC         Functional mobility during ADLs: Contact guard assist (approx 20' with SPC, pt reports feeling fatigued and requests to return to chair)      Extremity/Trunk Assessment Upper Extremity Assessment Upper Extremity Assessment: RUE deficits/detail RUE Deficits / Details: elbow flexion/extension; forearm pronation/supination PROM WFL, limited AROM; wrist approx 1/4 full AROM, hand able to open/shut RUE Sensation: decreased light touch   Lower Extremity Assessment Lower Extremity Assessment: Defer to PT evaluation        Vision       Perception     Praxis     Communication Communication Communication: No apparent difficulties   Cognition Arousal: Alert Behavior During Therapy: WFL for tasks assessed/performed Cognition: No apparent impairments                               Following commands: Intact        Cueing  Cueing Techniques: Verbal cues  Exercises Shoulder Exercises Elbow Flexion: AAROM, 5 reps Elbow Extension: AAROM, 5 reps Wrist Flexion: AAROM, 5 reps Wrist Extension: AAROM, 5 reps Digit Composite Flexion: AROM Composite Extension: AROM Other Exercises Other Exercises: provided RUE HEP focusing on elbow/forearm/wrist/hand ROM- pt performed AAROM with supervision    Shoulder Instructions       General Comments      Pertinent Vitals/ Pain       Pain Assessment Pain Assessment: No/denies  pain  Home Living                                          Prior Functioning/Environment              Frequency  Min 2X/week        Progress Toward Goals  OT Goals(current goals can now be found in the care plan section)  Progress towards OT goals: Progressing toward goals  Acute Rehab OT Goals Time For Goal Achievement: 09/25/23  Plan      Co-evaluation                 AM-PAC OT 6 Clicks Daily Activity     Outcome Measure   Help from another person eating meals?: A Little Help from another person taking care of personal grooming?: A Little Help from another person toileting, which includes using toliet, bedpan, or urinal?: A Little Help from another person bathing (including washing, rinsing, drying)?: Total Help from another person to put on and taking off regular upper body clothing?: A Lot Help from another person to put on and taking off regular lower body clothing?: A Little 6 Click Score: 15    End of Session Equipment Utilized During Treatment: Other (comment) (SPC)  OT Visit Diagnosis: Other abnormalities of gait and mobility (R26.89)   Activity Tolerance Patient tolerated treatment well   Patient Left in chair;with call bell/phone within reach;with family/visitor present   Nurse Communication          Time: 1610-9604 OT Time Calculation (min): 12 min  Charges: OT General Charges $OT Visit: 1 Visit OT Treatments $Therapeutic Activity: 8-22 mins  Gerre Kraft, OTD OTR/L  09/12/23, 11:52 AM

## 2023-09-12 NOTE — [place] FL2 (Signed)
 [place]  MEDICAID FL2 LEVEL OF CARE FORM     IDENTIFICATION  Patient Name: Allison Hall Birthdate: 1940/08/20 Sex: female Admission Date (Current Location): 09/11/2023  [place] and [place] Number:  Chiropodist and Address:  [facility], [address] [place], [place] [address]      Provider Number: [id number]  Attending Physician Name and Address:  Elner Hahn, MD  Relative Name and Phone Number:  Son: Wessie Shanks, [phone]. Daughter: Paticia Boning, [phone]    Current Level of Care: Hospital Recommended Level of Care: Skilled Nursing Facility Prior Approval Number:    Date Approved/Denied:   PASRR Number: [id number]  Discharge Plan: SNF    Current Diagnoses: Patient Active Problem List   Diagnosis Date Noted   Status post reverse total arthroplasty of right shoulder 09/11/2023   Foot pain, left 02/02/2023   Nondisplaced fracture of fourth metatarsal bone, left foot, initial encounter for closed fracture 02/02/2023   Elevated blood pressure reading 02/02/2023   History of hypertension 02/02/2023   Hair thinning 05/04/2020   Chronic pain of left knee 04/04/2018   Chronic pain syndrome 04/04/2018   Iron  deficiency anemia 10/31/2017   B12 deficiency 10/31/2017   Arthralgia 06/03/2017   Osteoporosis 10/04/2016   Carcinoma of upper-outer quadrant of right breast in female, estrogen receptor positive (HCC) 08/24/2016   Closed fracture of distal end of ulna 06/01/2015   Triggering of digit 05/27/2014   Hamstring muscle strain 05/26/2014   Arthritis of knee, degenerative 03/26/2014   Long term current use of opiate analgesic 04/21/2013   Arthritis, degenerative 04/21/2013   Anxiety 10/05/2011   Colon polyp 10/05/2011   Esophageal foreign body 10/05/2011   Acid reflux 10/05/2011   BP (high blood pressure) 10/05/2011   Hypercholesterolemia 10/05/2011    Orientation RESPIRATION BLADDER Height & Weight      Self, Time, Situation, Place  Normal Continent Weight: 75.3 kg Height:  5' 4 (162.6 cm)  BEHAVIORAL SYMPTOMS/MOOD NEUROLOGICAL BOWEL NUTRITION STATUS      Continent Diet (Heart Healthy, thin liquids)  AMBULATORY STATUS COMMUNICATION OF NEEDS Skin   Extensive Assist Verbally Surgical wounds                       Personal Care Assistance Level of Assistance  Bathing, Feeding, Dressing Bathing Assistance: Maximum assistance Feeding assistance: Maximum assistance Dressing Assistance: Maximum assistance     Functional Limitations Info             SPECIAL CARE FACTORS FREQUENCY  PT (By licensed PT), OT (By licensed OT)     PT Frequency: 5 times per week OT Frequency: 5 times per week            Contractures Contractures Info: Not present    Additional Factors Info  Code Status, Allergies Code Status Info: Full Code Allergies Info: Sulfamethoxazole, Vitamin C-E-Copper-Zinc_Lutein, Statins, Trazodone , Ciprofloxacin, Sulfa Antibiotics           Current Medications (09/12/2023):  This is the current hospital active medication list Current Facility-Administered Medications  Medication Dose Route Frequency Provider Last Rate Last Admin   0.9 %  sodium chloride  infusion   Intravenous Continuous Poggi, John J, MD   Stopped at 09/11/23 1527   acetaminophen  (TYLENOL ) tablet 325-650 mg  325-650 mg Oral Q6H PRN Poggi, John J, MD       acetaminophen  (TYLENOL ) tablet 500 mg  500 mg Oral Q6H Poggi, John J, MD   500 mg  at 09/12/23 0551   bisacodyl (DULCOLAX) suppository 10 mg  10 mg Rectal Daily PRN Poggi, John J, MD       calcium-vitamin D  (OSCAL WITH D) 500-5 MG-MCG per tablet 1 tablet  1 tablet Oral Q breakfast Poggi, John J, MD   1 tablet at 09/12/23 0750   carvedilol (COREG) tablet 6.25 mg  6.25 mg Oral BID WC Poggi, John J, MD   6.25 mg at 09/12/23 0750   cholecalciferol (VITAMIN D3) 25 MCG (1000 UNIT) tablet 1,000 Units  1,000 Units Oral Daily Poggi, Kaylene Pascal, MD   1,000  Units at 09/11/23 1628   diphenhydrAMINE (BENADRYL) 12.5 MG/5ML elixir 12.5-25 mg  12.5-25 mg Oral Q4H PRN Poggi, John J, MD       docusate sodium (COLACE) capsule 100 mg  100 mg Oral BID Poggi, John J, MD   100 mg at 09/11/23 1628   enoxaparin (LOVENOX) injection 40 mg  40 mg Subcutaneous Q24H Poggi, John J, MD   40 mg at 09/12/23 0749   HYDROcodone -acetaminophen  (NORCO/VICODIN) 5-325 MG per tablet 1-2 tablet  1-2 tablet Oral Q4H PRN Poggi, John J, MD   1 tablet at 09/11/23 1455   lisinopril (ZESTRIL) tablet 40 mg  40 mg Oral Daily Poggi, John J, MD       loperamide (IMODIUM) capsule 4 mg  4 mg Oral PRN Poggi, John J, MD       magnesium gluconate (MAGONATE) tablet 500 mg  500 mg Oral Daily Poggi, John J, MD       magnesium hydroxide (MILK OF MAGNESIA) suspension 30 mL  30 mL Oral Daily PRN Poggi, John J, MD       metoCLOPramide (REGLAN) tablet 5-10 mg  5-10 mg Oral Q8H PRN Poggi, John J, MD       Or   metoCLOPramide (REGLAN) injection 5-10 mg  5-10 mg Intravenous Q8H PRN Poggi, John J, MD       midazolam  (VERSED ) injection 1 mg  1 mg Intravenous Q5 Min x 2 PRN Vanice Genre, MD       ondansetron  (ZOFRAN ) tablet 4 mg  4 mg Oral Q6H PRN Poggi, John J, MD       Or   ondansetron  (ZOFRAN ) injection 4 mg  4 mg Intravenous Q6H PRN Poggi, John J, MD       pantoprazole (PROTONIX) EC tablet 40 mg  40 mg Oral Daily Poggi, John J, MD   40 mg at 09/11/23 1628   sodium phosphate  (FLEET) enema 1 enema  1 enema Rectal Once PRN Poggi, John J, MD       spironolactone (ALDACTONE) tablet 25 mg  25 mg Oral Daily Poggi, John J, MD   25 mg at 09/11/23 1628     Discharge Medications: Please see discharge summary for a list of discharge medications.  Relevant Imaging Results:  Relevant Lab Results:   Additional Information SSN: [id number]  Alexandra Ice, RN

## 2023-09-12 NOTE — TOC Initial Note (Addendum)
 Transition of Care [facility]) - Initial/Assessment Note    Patient Details  Name: Allison Hall MRN: [id number] Date of Birth: 05-19-1940  Transition of Care [facility]) CM/SW Contact:    Alexandra Ice, RN Phone Number: 09/12/2023, 9:57 AM  Clinical Narrative:                  Met with patient at bedside. She states her daughter lives with her, and she works during the day. She wants to go to rehab at discharge. She would like to go to [facility].   PASRR, FL2 and referral sent to [facility], awaiting response. Notified [place]  at [facility] that patient is wanting to go to SNF at discharge.  Expected Discharge Plan: Skilled Nursing Facility Barriers to Discharge: Continued Medical Work up   Patient Goals and CMS Choice Patient states their goals for this hospitalization and ongoing recovery are:: I want to go to SNF, my daughter can't help me at home she works [email or web address] Compare Post Acute Care list provided to:: Patient Choice offered to / list presented to : Patient      Expected Discharge Plan and Services   Discharge Planning Services: CM Consult Post Acute Care Choice: Skilled Nursing Facility Living arrangements for the past 2 months: Single Family Home                   DME Agency: NA       HH Arranged: NA          Prior Living Arrangements/Services Living arrangements for the past 2 months: Single Family Home Lives with:: Adult Children Patient language and need for interpreter reviewed:: Yes Do you feel safe going back to the place where you live?: Yes      Need for Family Participation in Patient Care: No (Comment) Care giver support system in place?: No (comment)   Criminal Activity/Legal Involvement Pertinent to Current Situation/Hospitalization: No - Comment as needed  Activities of Daily Living   ADL Screening (condition at time of admission) Independently performs ADLs?: Yes (appropriate for developmental age) Is the  patient deaf or have difficulty hearing?: No Does the patient have difficulty seeing, even when wearing glasses/contacts?: No Does the patient have difficulty concentrating, remembering, or making decisions?: No  Permission Sought/Granted                  Emotional Assessment Appearance:: Appears stated age Attitude/Demeanor/Rapport: Engaged Affect (typically observed): Accepting, Appropriate Orientation: : Oriented to Self, Oriented to Place, Oriented to  Time, Oriented to Situation Alcohol / Substance Use: Not Applicable Psych Involvement: No (comment)  Admission diagnosis:  Primary osteoarthritis of right shoulder [M19.011] Rotator cuff tendinitis, right [M75.81] Status post reverse total arthroplasty of right shoulder [Z96.611] Patient Active Problem List   Diagnosis Date Noted   Status post reverse total arthroplasty of right shoulder 09/11/2023   Foot pain, left 02/02/2023   Nondisplaced fracture of fourth metatarsal bone, left foot, initial encounter for closed fracture 02/02/2023   Elevated blood pressure reading 02/02/2023   History of hypertension 02/02/2023   Hair thinning 05/04/2020   Chronic pain of left knee 04/04/2018   Chronic pain syndrome 04/04/2018   Iron  deficiency anemia 10/31/2017   B12 deficiency 10/31/2017   Arthralgia 06/03/2017   Osteoporosis 10/04/2016   Carcinoma of upper-outer quadrant of right breast in female, estrogen receptor positive (HCC) 08/24/2016   Closed fracture of distal end of ulna 06/01/2015   Triggering of digit 05/27/2014   Hamstring  muscle strain 05/26/2014   Arthritis of knee, degenerative 03/26/2014   Long term current use of opiate analgesic 04/21/2013   Arthritis, degenerative 04/21/2013   Anxiety 10/05/2011   Colon polyp 10/05/2011   Esophageal foreign body 10/05/2011   Acid reflux 10/05/2011   BP (high blood pressure) 10/05/2011   Hypercholesterolemia 10/05/2011   PCP:  Rosebud Confer, MD Pharmacy:    [facility] DELIVERY - [place], [place] - [address] [place] [address] Phone: [phone] Fax: [phone]  [id number], [place] - [address] AT [facility] [address] [place] [address] [facility] [place] [address] Phone: [phone] Fax: [phone]     Social Drivers of Health (SDOH) Social History: SDOH Screenings   Food Insecurity: No Food Insecurity (09/11/2023)  Housing: Low Risk  (09/11/2023)  Transportation Needs: No Transportation Needs (09/11/2023)  Utilities: Not At Risk (09/11/2023)  Depression (PHQ2-9): Low Risk  (05/14/2018)  Financial Resource Strain: Low Risk  (03/02/2022)   Received from [facility]  Physical Activity: Inactive (12/03/2017)  Social Connections: Unknown (09/11/2023)  Stress: No Stress Concern Present (12/03/2017)  Tobacco Use: Medium Risk (09/11/2023)   SDOH Interventions:     Readmission Risk Interventions     No data to display

## 2023-09-13 DIAGNOSIS — M75101 Unspecified rotator cuff tear or rupture of right shoulder, not specified as traumatic: Secondary | ICD-10-CM | POA: Diagnosis not present

## 2023-09-13 LAB — CBC
HCT: 30.2 % — ABNORMAL LOW (ref 36.0–46.0)
Hemoglobin: 10.1 g/dL — ABNORMAL LOW (ref 12.0–15.0)
MCH: 30.7 pg (ref 26.0–34.0)
MCHC: 33.4 g/dL (ref 30.0–36.0)
MCV: 91.8 fL (ref 80.0–100.0)
Platelets: 226 10*3/uL (ref 150–400)
RBC: 3.29 MIL/uL — ABNORMAL LOW (ref 3.87–5.11)
RDW: 14.5 % (ref 11.5–15.5)
WBC: 9.8 10*3/uL (ref 4.0–10.5)
nRBC: 0 % (ref 0.0–0.2)

## 2023-09-13 MED ORDER — HYDROCODONE-ACETAMINOPHEN 5-325 MG PO TABS: 1.0000 | ORAL_TABLET | ORAL | 0 refills | Status: DC | PRN

## 2023-09-13 MED ORDER — ASPIRIN 325 MG PO TBEC: 325.0000 mg | DELAYED_RELEASE_TABLET | Freq: Every day | ORAL | 0 refills | Status: DC

## 2023-09-13 MED ORDER — LISINOPRIL 20 MG PO TABS
ORAL_TABLET | ORAL | Status: AC
  Filled 2023-09-13: qty 1

## 2023-09-13 MED ORDER — LACTULOSE 10 GM/15ML PO SOLN: 20.0000 g | Freq: Every day | ORAL | Status: DC | PRN

## 2023-09-13 NOTE — Progress Notes (Signed)
 DISCHARGE NOTE:    Pt dc with IV removed and pt given dc instructions. Pt voices no questions or concerns at this time. Nurse called report to Camilo Cella at [facility] facility. Pt given honeycomb dressings as well as medication scripts. Pt wheeled down to medical mall entrance by staff and pt's daughter provided transportation.

## 2023-09-13 NOTE — Care Management Obs Status (Signed)
 MEDICARE OBSERVATION STATUS NOTIFICATION   Patient Details  Name: RAILYNN BALLO MRN: [id number] Date of Birth: May 13, 1940   Medicare Observation Status Notification Given:       Anise Kerns 09/13/2023, 1:07 PM

## 2023-09-13 NOTE — Discharge Instructions (Signed)
 Diet: As you were doing prior to hospitalization   Shower:  May shower but keep the wounds dry, use an occlusive plastic wrap, NO SOAKING IN TUB.  If the bandage gets wet, change with a clean dry gauze.  Dressing:  You may change your dressing as needed. Honeycomb dressings have been provided.  Activity:  Increase activity slowly as tolerated, but follow the weight bearing instructions below.  No lifting or driving for 6 weeks.  Weight Bearing:   Non-weightbearing to the right arm.  To prevent constipation: you may use a stool softener such as -  Colace (over the counter) 100 mg by mouth twice a day  Drink plenty of fluids (prune juice may be helpful) and high fiber foods Miralax (over the counter) for constipation as needed.    Itching:  If you experience itching with your medications, try taking only a single pain pill, or even half a pain pill at a time.  You may take up to 10 pain pills per day, and you can also use benadryl over the counter for itching or also to help with sleep.   Precautions:  If you experience chest pain or shortness of breath - call 911 immediately for transfer to the hospital emergency department!!  If you develop a fever greater that 101 F, purulent drainage from wound, increased redness or drainage from wound, or calf pain-Call [facility]                                              Follow- Up Appointment:  Please call for an appointment to be seen in 2 weeks at [facility]

## 2023-09-13 NOTE — Progress Notes (Signed)
  Subjective: 2 Days Post-Op Procedure(s) (LRB): ARTHROPLASTY, SHOULDER, TOTAL, REVERSE (Right) TENODESIS, BICEPS (Right) Patient reports pain as mild in the right shoulder Patient is well, and has had no acute complaints or problems Plan is to go Skilled nursing facility after hospital stay.  Will be going to [facility] today. Negative for chest pain and shortness of breath Fever: no Gastrointestinal:Negative for nausea and vomiting Has not had a BM yet.   Objective: Vital signs in last 24 hours: Temp:  [98 F (36.7 C)-99.9 F (37.7 C)] 98 F (36.7 C) (06/19 0731) Pulse Rate:  [71-81] 72 (06/19 0731) Resp:  [15-18] 18 (06/19 0731) BP: (128-145)/(61-70) 145/69 (06/19 0731) SpO2:  [94 %-98 %] 94 % (06/19 0731)  Intake/Output from previous day: No intake or output data in the 24 hours ending 09/13/23 0739   Intake/Output this shift: No intake/output data recorded.  Labs: Recent Labs    09/12/23 0611 09/13/23 0609  HGB 9.9* 10.1*   Recent Labs    09/12/23 0611 09/13/23 0609  WBC 10.9* 9.8  RBC 3.23* 3.29*  HCT 29.6* 30.2*  PLT 222 226   Recent Labs    09/12/23 0611  NA 135  K 3.7  CL 102  CO2 25  BUN 18  CREATININE 0.78  GLUCOSE 152*  CALCIUM 9.0   No results for input(s): LABPT, INR in the last 72 hours.   EXAM General - Patient is Alert, Appropriate, and Oriented Extremity - Sling intact to the right shoulder. Dressing intact without any drainage. Decreased sensation to the right arm due to recent interscalene block Able to flex and extend fingers without pain Motor Function - intact, decreased dorsiflexion in the right foot, h/o footdrop. Abdomen soft with intact bowel sounds.  Past Medical History:  Diagnosis Date   Anemia    Anxiety    Breast cancer of upper-outer quadrant of right female breast (HCC) 08/24/2016   Colon polyp    COVID-19 02/14/2020   received treatment   Depression    GERD (gastroesophageal reflux disease)     Hypercholesteremia    Hypertension    Osteoarthritis    Personal history of radiation therapy 2018   RIGHT BREAST CA UOQ   Primary osteoarthritis of right shoulder    Scoliosis of lumbar spine    Skin cancer    Thyroid  nodule 04/17/2016    Assessment/Plan: 2 Days Post-Op Procedure(s) (LRB): ARTHROPLASTY, SHOULDER, TOTAL, REVERSE (Right) TENODESIS, BICEPS (Right) Principal Problem:   Status post reverse total arthroplasty of right shoulder  Estimated body mass index is 28.49 kg/m as calculated from the following:   Height as of this encounter: 5' 4 (1.626 m).   Weight as of this encounter: 75.3 kg. Advance diet Up with therapy D/C IV fluids when tolerating po intake.  Labs and vitals reviewed.  WBC 9.8, Hg 10.1 Up with therapy today. Plan for discharge to [facility] today. Continue to work on a BM.  DVT Prophylaxis - Lovenox and TED hose NWB to the right arm.  Antoine Bathe, PA-C [facility] Orthopaedic Surgery 09/13/2023, 7:39 AM

## 2023-09-13 NOTE — Discharge Summary (Signed)
 Physician Discharge Summary  Patient ID: Allison Hall MRN: [id number] DOB/AGE: [birth year] 83 y.o.  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   Discharge Diagnoses: Patient Active Problem List   Diagnosis Date Noted   Status post reverse total arthroplasty of right shoulder 09/11/2023   Foot pain, left 02/02/2023   Nondisplaced fracture of fourth metatarsal bone, left foot, initial encounter for closed fracture 02/02/2023   Elevated blood pressure reading 02/02/2023   History of hypertension 02/02/2023   Hair thinning 05/04/2020   Chronic pain of left knee 04/04/2018   Chronic pain syndrome 04/04/2018   Iron  deficiency anemia 10/31/2017   B12 deficiency 10/31/2017   Arthralgia 06/03/2017   Osteoporosis 10/04/2016   Carcinoma of upper-outer quadrant of right breast in female, estrogen receptor positive (HCC) 08/24/2016   Closed fracture of distal end of ulna 06/01/2015   Triggering of digit 05/27/2014   Hamstring muscle strain 05/26/2014   Arthritis of knee, degenerative 03/26/2014   Long term current use of opiate analgesic 04/21/2013   Arthritis, degenerative 04/21/2013   Anxiety 10/05/2011   Colon polyp 10/05/2011   Esophageal foreign body 10/05/2011   Acid reflux 10/05/2011   BP (high blood pressure) 10/05/2011   Hypercholesterolemia 10/05/2011    Past Medical History:  Diagnosis Date   Anemia    Anxiety    Breast cancer of upper-outer quadrant of right female breast (HCC) 08/24/2016   Colon polyp    COVID-19 02/14/2020   received treatment   Depression    GERD (gastroesophageal reflux disease)    Hypercholesteremia    Hypertension    Osteoarthritis    Personal history  of radiation therapy 2018   RIGHT BREAST CA UOQ   Primary osteoarthritis of right shoulder    Scoliosis of lumbar spine    Skin cancer    Thyroid  nodule 04/17/2016     Transfusion: None.   Consultants (if any):   Discharged Condition: Improved  Hospital Course: Allison Hall is an 83 y.o. female who was admitted 09/11/2023 with a diagnosis of advanced degenerative joint disease with rotator cuff and biceps tendinopathy of the right shoulder and went to the operating room on 09/11/2023 and underwent the above named procedures.    Surgeries: Procedure(s): ARTHROPLASTY, SHOULDER, TOTAL, REVERSE TENODESIS, BICEPS on 09/11/2023 Patient tolerated the surgery well. Taken to PACU where she was stabilized and then transferred to the post op recovery area.  Started on Lovenox 40mg  q 24 hrs. Foot pumps applied bilaterally at 80 mm. Heels elevated on bed with rolled towels. No evidence of DVT. Negative Homan. Physical therapy started on day #1 for gait training and transfer. OT started day #1 for ADL and assisted devices.  Patient's IV was removed on POD2.  Implants: All press-fit Integra system with an 11 mm stem, a standard metaphyseal body, a +3 mm humeral platform, a mini baseplate, and a 38 mm concentric +5 mm laterally offset glenosphere.   She was given perioperative antibiotics:  Anti-infectives (From admission, onward)    Start     Dose/Rate Route Frequency Ordered Stop   09/11/23 1700  ceFAZolin (ANCEF) IVPB 2g/100 mL premix        2 g 200 mL/hr over 30 Minutes Intravenous Every 6 hours 09/11/23 1447 09/11/23 2339   09/11/23 0600  ceFAZolin (ANCEF) IVPB 2g/100 mL premix        2 g 200 mL/hr over 30 Minutes Intravenous On call to O.R. 09/11/23 4098 09/11/23 1043     .  She was given sequential compression devices, early ambulation, and Lovenox for DVT prophylaxis.  She benefited maximally from the hospital stay and there were no complications.    Recent vital signs:   Vitals:   09/12/23 2246 09/13/23 0602  BP: (!) 140/70 (!) 142/68  Pulse: 76 74  Resp: 16 16  Temp: 98.4 F (36.9 C) 98.2 F (36.8 C)  SpO2: 98% 98%    Recent laboratory studies:  Lab Results  Component Value Date   HGB 10.1 (L) 09/13/2023   HGB 9.9 (L) 09/12/2023   HGB 11.7 (L) 08/08/2023   Lab Results  Component Value Date   WBC 9.8 09/13/2023   PLT 226 09/13/2023   No results found for: INR Lab Results  Component Value Date   NA 135 09/12/2023   K 3.7 09/12/2023   CL 102 09/12/2023   CO2 25 09/12/2023   BUN 18 09/12/2023   CREATININE 0.78 09/12/2023   GLUCOSE 152 (H) 09/12/2023    Discharge Medications:   Allergies as of 09/13/2023       Reactions   Sulfamethoxazole Other (See Comments)   Anxiety and imsomnia   Other Itching   Vit C-vit E-copper-zinc-lutein - itching and fatigue (per [facility])   Statins Other (See Comments)   Muscle pain    Trazodone  Other (See Comments)   Pt states she felt unsteady    Ciprofloxacin Anxiety   Sulfa Antibiotics Anxiety        Medication List     TAKE these medications    acetaminophen  325 MG tablet Commonly known as: TYLENOL  Take 950 mg by mouth every 6 (six) hours as needed for moderate pain (pain score 4-6).   TYLENOL  ARTHRITIS PAIN PO Take 650 mg by mouth once.   aspirin EC 325 MG tablet Take 1 tablet (325 mg total) by mouth daily.   CALCIUM 600 + D PO Take 1 tablet by mouth daily.   carvedilol 6.25 MG tablet Commonly known as: COREG Take 6.25 mg by mouth 2 (two) times daily with a meal.   Eye Vitamins Caps Take 1 capsule by mouth daily. Vision MD   HAIR SKIN AND NAILS FORMULA PO Take 2 tablets by mouth daily.   Fish Oil 1200 MG Caps Take 1,200 mg by mouth daily.   HYDROcodone -acetaminophen  5-325 MG tablet Commonly known as: NORCO/VICODIN Take 1-2 tablets by mouth every 4 (four) hours as needed for moderate pain (pain score 4-6).   lisinopril 40 MG tablet Commonly known as: ZESTRIL Take  40 mg by mouth daily.   loperamide 2 MG capsule Commonly known as: IMODIUM Take 4 mg by mouth as needed for diarrhea or loose stools.   magnesium gluconate 500 (27 Mg) MG Tabs tablet Commonly known as: MAGONATE Take 500 mg by mouth daily.   omeprazole 40 MG capsule Commonly known as: PRILOSEC Take 40 mg by mouth daily.   spironolactone 25 MG tablet Commonly known as: ALDACTONE Take 25 mg by mouth daily.   Vitamin D3 25 MCG (1000 UT) Caps Take 1,000 Units by mouth daily.        Diagnostic Studies: DG Shoulder Right Port Result Date: 09/11/2023 CLINICAL DATA:  Status post reverse total right shoulder arthroplasty. EXAM: RIGHT SHOULDER - 1 VIEW COMPARISON:  None Available. FINDINGS: Status post reverse  total right shoulder arthroplasty. There are overlying anterior surgical skin staples. No perihardware lucency is seen to indicate hardware failure or loosening. Expected mild postoperative subcutaneous air. Moderate acromioclavicular joint space narrowing and peripheral osteophytosis. No acute fracture or dislocation. Plate and screw fixation hardware overlies the mid left chest, not well evaluated on the current study but possibly secondary to median sternotomy fixation. IMPRESSION: Status post reverse total right shoulder arthroplasty without evidence of hardware failure. Electronically Signed   By: Bertina Broccoli M.D.   On: 09/11/2023 14:23   US  OR NERVE BLOCK-IMAGE ONLY [facility]) Result Date: 09/11/2023 There is no interpretation for this exam.  This order is for images obtained during a surgical procedure.  Please See Surgeries Tab for more information regarding the procedure.   Disposition: Plan for discharge to [facility] today.   Contact information for follow-up providers     Rojelio Clement, PA-C Follow up in 14 day(s).   Specialty: Physician Assistant Why: Nigel Bart information: [address] [place] [address] [phone]               Contact information for after-discharge care     Destination     [facility] Aaron Aas   Service: Skilled Nursing Contact information: [address] [place]  [address] [phone]                    Signed: Juanell Nora PA-C 09/13/2023, 7:29 AM

## 2023-09-13 NOTE — Plan of Care (Signed)
 Progressing towards discharge

## 2023-09-13 NOTE — TOC Transition Note (Signed)
 Transition of Care [facility]) - Discharge Note   Patient Details  Name: Allison Hall MRN: [id number] Date of Birth: 1941-01-02  Transition of Care [facility]) CM/SW Contact:  Alexandra Ice, RN Phone Number: 09/13/2023, 11:02 AM   Clinical Narrative:      Patient to discharge today, going to [facility]. Discharge summary and orders sent to facility via HUB. Patient going to room 701, nurse to call report to [phone]. Family to transport patient to facility. Notified bedside nurse.  Final next level of care: Skilled Nursing Facility Barriers to Discharge: Barriers Resolved   Patient Goals and CMS Choice Patient states their goals for this hospitalization and ongoing recovery are:: I want to go to SNF, my daughter can't help me at home she works [email or web address] Compare Post Acute Care list provided to:: Patient Choice offered to / list presented to : Patient      Discharge Placement              Patient chooses bed at: [facility] [place] Patient to be transferred to facility by: family Name of family member notified: Paticia Boning Patient and family notified of of transfer: 09/13/23  Discharge Plan and Services Additional resources added to the After Visit Summary for     Discharge Planning Services: CM Consult Post Acute Care Choice: Skilled Nursing Facility            DME Agency: NA       HH Arranged: NA          Social Drivers of Health (SDOH) Interventions SDOH Screenings   Food Insecurity: No Food Insecurity (09/11/2023)  Housing: Low Risk  (09/11/2023)  Transportation Needs: No Transportation Needs (09/11/2023)  Utilities: Not At Risk (09/11/2023)  Depression (PHQ2-9): Low Risk  (05/14/2018)  Financial Resource Strain: Low Risk  (03/02/2022)   Received from [facility]  Physical Activity: Inactive (12/03/2017)  Social Connections: Unknown (09/11/2023)  Stress: No Stress Concern Present (12/03/2017)  Tobacco Use: Medium Risk  (09/11/2023)     Readmission Risk Interventions     No data to display

## 2023-09-13 NOTE — Progress Notes (Signed)
 The patient is currently admitted status post a right reverse total shoulder arthroplasty.  The patient does have a history of left knee osteoarthritic changes and does have a history of undergoing left knee steroid injections in the past.  The patient requested a left knee steroid injection while she was admitted.  The injection was performed today without any complication and described below.  The patient was instructed to gradually increase her weightbearing as tolerated to the left lower extremity.  Left Knee Steroid Injection The risk and benefits of a left knee intra-articular steroid injection were discussed today with the patient.  After discussing the risk and benefits of the procedure the patient elected to proceed.  The knee was flexed to 90 degrees.  Gloves were donned prior to the lateral joint line being cleaned using alcohol swab and ethyl chloride was used for topical anesthetic.  The left knee was then injected with 2 cc of Kenalog -40 and 8 cc of 0.5% Marcaine .  The patient tolerated the injection without any complications.  Band-Aid was applied following the procedure.  Antoine Bathe, PA-C, CAQ-OS [facility]

## 2023-09-13 NOTE — Plan of Care (Signed)
   Problem: Education: Goal: Knowledge of General Education information will improve Description: Including pain rating scale, medication(s)/side effects and non-pharmacologic comfort measures Outcome: Progressing   Problem: Activity: Goal: Risk for activity intolerance will decrease Outcome: Progressing   Problem: Nutrition: Goal: Adequate nutrition will be maintained Outcome: Progressing

## 2023-09-13 NOTE — Progress Notes (Signed)
 Physical Therapy Treatment Patient Details Name: Allison Hall MRN: [id number] DOB: 27-Mar-1941 Today's Date: 09/13/2023   History of Present Illness Pt is a 83 y.o. 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    PT Comments  Pt was long sitting in recliner upon arrival. She agrees to OOB activity and was willing to participate in session.  I feel better now that I was able to get some sleep. She stood and tolerated ambulation well with SPC. No LOB however pt limited by fatigue. DC recs remain appropriate to maximize pt's independence and safety with all ADLs.     If plan is discharge home, recommend the following: A little help with walking and/or transfers;A little help with bathing/dressing/bathroom;Assist for transportation;Help with stairs or ramp for entrance     Equipment Recommendations  Other (comment) (Defer to next level of care- may benifit from AFO at rehab)       Precautions / Restrictions Precautions Precautions: Shoulder Type of Shoulder Precautions: Reverse TSA Shoulder Interventions: Shoulder sling/immobilizer;Off for dressing/bathing/exercises Precaution Booklet Issued: Yes (comment) Recall of Precautions/Restrictions: Intact Restrictions Weight Bearing Restrictions Per Provider Order: Yes RUE Weight Bearing Per Provider Order: Non weight bearing     Mobility  Bed Mobility  General bed mobility comments: NT in chair pre/post session    Transfers Overall transfer level: Needs assistance Equipment used: Straight cane Transfers: Sit to/from Stand Sit to Stand: Contact guard assist   Ambulation/Gait Ambulation/Gait assistance: Contact guard assist, Min assist Gait Distance (Feet): 100 Feet Assistive device: Straight cane Gait Pattern/deviations: Step-to pattern, Trunk flexed, Narrow base of support  General Gait Details: pt tolerated ambulation with SPC well. discussed AFO for future ambulation with shoes. Recommended pt ask rehab PT  to trial one prior to being ordered    Balance Overall balance assessment: Needs assistance Sitting-balance support: Feet supported Sitting balance-Leahy Scale: Good     Standing balance support: During functional activity, No upper extremity supported Standing balance-Leahy Scale: Fair      Hotel manager: No apparent difficulties  Cognition Arousal: Alert Behavior During Therapy: WFL for tasks assessed/performed   PT - Cognitive impairments: No apparent impairments      Following commands: Intact      Cueing Cueing Techniques: Verbal cues, Tactile cues         Pertinent Vitals/Pain Pain Assessment Pain Assessment: No/denies pain     PT Goals (current goals can now be found in the care plan section) Acute Rehab PT Goals Patient Stated Goal: to go to rehab Progress towards PT goals: Progressing toward goals    Frequency    7X/week       AM-PAC PT 6 Clicks Mobility   Outcome Measure  Help needed turning from your back to your side while in a flat bed without using bedrails?: A Lot Help needed moving from lying on your back to sitting on the side of a flat bed without using bedrails?: A Lot Help needed moving to and from a bed to a chair (including a wheelchair)?: A Little Help needed standing up from a chair using your arms (e.g., wheelchair or bedside chair)?: A Little Help needed to walk in hospital room?: A Little Help needed climbing 3-5 steps with a railing? : A Little 6 Click Score: 16    End of Session   Activity Tolerance: Patient tolerated treatment well Patient left: in chair;with call bell/phone within reach Nurse Communication: Mobility status PT Visit Diagnosis: Muscle weakness (generalized) (M62.81);Difficulty in walking,  not elsewhere classified (R26.2)     Time: 2956-2130 PT Time Calculation (min) (ACUTE ONLY): 13 min  Charges:    $Gait Training: 8-22 mins PT General Charges $$ ACUTE PT VISIT: 1  Visit                    Chester Costa PTA 09/13/23, 1:15 PM

## 2023-10-15 ENCOUNTER — Other Ambulatory Visit: Payer: Self-pay | Admitting: Family Medicine

## 2023-10-15 DIAGNOSIS — Z1231 Encounter for screening mammogram for malignant neoplasm of breast: Secondary | ICD-10-CM

## 2023-11-15 ENCOUNTER — Ambulatory Visit
Admission: RE | Admit: 2023-11-15 | Discharge: 2023-11-15 | Disposition: A | Discharge status: Home / Self Care | Source: Ambulatory Visit | Attending: Family Medicine | Admitting: Family Medicine

## 2023-11-15 DIAGNOSIS — Z1231 Encounter for screening mammogram for malignant neoplasm of breast: Secondary | ICD-10-CM | POA: Diagnosis present

## 2024-02-08 ENCOUNTER — Inpatient Hospital Stay (HOSPITAL_BASED_OUTPATIENT_CLINIC_OR_DEPARTMENT_OTHER): Admitting: Internal Medicine

## 2024-02-08 ENCOUNTER — Encounter: Payer: Self-pay | Admitting: Internal Medicine

## 2024-02-08 ENCOUNTER — Inpatient Hospital Stay: Attending: Internal Medicine

## 2024-02-08 ENCOUNTER — Inpatient Hospital Stay

## 2024-02-08 VITALS — BP 114/70 | HR 73 | Temp 98.2°F | Resp 16 | Ht 64.0 in | Wt 165.0 lb

## 2024-02-08 DIAGNOSIS — M810 Age-related osteoporosis without current pathological fracture: Secondary | ICD-10-CM | POA: Insufficient documentation

## 2024-02-08 DIAGNOSIS — C50411 Malignant neoplasm of upper-outer quadrant of right female breast: Secondary | ICD-10-CM | POA: Insufficient documentation

## 2024-02-08 DIAGNOSIS — D509 Iron deficiency anemia, unspecified: Secondary | ICD-10-CM | POA: Insufficient documentation

## 2024-02-08 DIAGNOSIS — N182 Chronic kidney disease, stage 2 (mild): Secondary | ICD-10-CM | POA: Insufficient documentation

## 2024-02-08 DIAGNOSIS — E538 Deficiency of other specified B group vitamins: Secondary | ICD-10-CM | POA: Insufficient documentation

## 2024-02-08 DIAGNOSIS — I129 Hypertensive chronic kidney disease with stage 1 through stage 4 chronic kidney disease, or unspecified chronic kidney disease: Secondary | ICD-10-CM | POA: Insufficient documentation

## 2024-02-08 DIAGNOSIS — C50412 Malignant neoplasm of upper-outer quadrant of left female breast: Secondary | ICD-10-CM | POA: Insufficient documentation

## 2024-02-08 DIAGNOSIS — Z170 Estrogen receptor positive status [ER+]: Secondary | ICD-10-CM | POA: Diagnosis not present

## 2024-02-08 DIAGNOSIS — E871 Hypo-osmolality and hyponatremia: Secondary | ICD-10-CM | POA: Insufficient documentation

## 2024-02-08 DIAGNOSIS — Z87891 Personal history of nicotine dependence: Secondary | ICD-10-CM | POA: Insufficient documentation

## 2024-02-08 LAB — CMP (CANCER CENTER ONLY)
ALT: 19 U/L (ref 0–44)
AST: 21 U/L (ref 15–41)
Albumin: 4.5 g/dL (ref 3.5–5.0)
Alkaline Phosphatase: 73 U/L (ref 38–126)
Anion gap: 9 (ref 5–15)
BUN: 28 mg/dL — ABNORMAL HIGH (ref 8–23)
CO2: 24 mmol/L (ref 22–32)
Calcium: 9.8 mg/dL (ref 8.9–10.3)
Chloride: 98 mmol/L (ref 98–111)
Creatinine: 0.96 mg/dL (ref 0.44–1.00)
GFR, Estimated: 58 mL/min — ABNORMAL LOW (ref >60)
Glucose, Bld: 93 mg/dL (ref 70–99)
Potassium: 4.2 mmol/L (ref 3.5–5.1)
Sodium: 131 mmol/L — ABNORMAL LOW (ref 135–145)
Total Bilirubin: 0.6 mg/dL (ref 0.0–1.2)
Total Protein: 7.2 g/dL (ref 6.5–8.1)

## 2024-02-08 LAB — CBC WITH DIFFERENTIAL (CANCER CENTER ONLY)
Abs Immature Granulocytes: 0.02 K/uL (ref 0.00–0.07)
Basophils Absolute: 0 K/uL (ref 0.0–0.1)
Basophils Relative: 1 %
Eosinophils Absolute: 0.1 K/uL (ref 0.0–0.5)
Eosinophils Relative: 2 %
HCT: 31.9 % — ABNORMAL LOW (ref 36.0–46.0)
Hemoglobin: 11 g/dL — ABNORMAL LOW (ref 12.0–15.0)
Immature Granulocytes: 0 %
Lymphocytes Relative: 33 %
Lymphs Abs: 1.7 K/uL (ref 0.7–4.0)
MCH: 31.9 pg (ref 26.0–34.0)
MCHC: 34.5 g/dL (ref 30.0–36.0)
MCV: 92.5 fL (ref 80.0–100.0)
Monocytes Absolute: 0.6 K/uL (ref 0.1–1.0)
Monocytes Relative: 11 %
Neutro Abs: 2.8 K/uL (ref 1.7–7.7)
Neutrophils Relative %: 53 %
Platelet Count: 211 K/uL (ref 150–400)
RBC: 3.45 MIL/uL — ABNORMAL LOW (ref 3.87–5.11)
RDW: 14.1 % (ref 11.5–15.5)
WBC Count: 5.2 K/uL (ref 4.0–10.5)
nRBC: 0 % (ref 0.0–0.2)

## 2024-02-08 LAB — VITAMIN D 25 HYDROXY (VIT D DEFICIENCY, FRACTURES): Vit D, 25-Hydroxy: 31.26 ng/mL (ref 30–100)

## 2024-02-08 NOTE — Assessment & Plan Note (Addendum)
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  OFF Aromasin - sec to MSK pain/hair loss [Holding since march 2024]; discontinue sec to arthralgia/hair loss patient preference.  Clinically no evidence of recurrence.  Mammogram AUG 2025 WNL.     Osteoporosis Reclast infusion management discussed. Delay required due to upcoming dental procedures. - Delay Reclast infusion until dental clearance is obtained. - Schedule Reclast infusion after dental clearance.  Iron  deficiency anemia Hemoglobin at 11. Iron  levels decreasing despite supplementation. - Continue iron  supplementation. - Encouraged dietary intake of iron -rich foods.  Vitamin B12 deficiency Managed with supplementation. - Continue Vitamin B12 supplementation.  Hyponatremia Mild hyponatremia with sodium at 131. No immediate concerns.  Chronic kidney disease, stage 2 Stage 2 CKD with GFR at 58. Creatinine normal.  Planned right knee replacement for osteoarthritis Severe osteoarthritis necessitating knee replacement. Surgery considered for December. - Proceed with right knee replacement surgery as planned.     *mychart  Q58m- # DISPOSITION: # HOLD Reclast today # Follow up in Feb 2026 MD; labs- [facility] with diff, CMP, CA27.29], vit D levels; reclast; BMD-- Dr.B  Cc.Dr.Zachary Claudene-

## 2024-02-08 NOTE — Progress Notes (Signed)
 Pt reports PCP discontinued calcium  supplement and started her on Vitamin b12 po and iron .  Pt unsure of iron  dosage but will verify and send message.

## 2024-02-08 NOTE — Progress Notes (Signed)
 [facility] CONSULT NOTE  Patient Care Team: Claudene Arthea Sharper, MD as PCP - General (Family Medicine) Claudene Larinda Bolder, MD (Inactive) as Referring Physician (Surgery) Rennie Cindy SAUNDERS, MD as Consulting Physician (Oncology)  CHIEF COMPLAINTS/PURPOSE OF CONSULTATION: Breast cancer  #  Oncology History Overview Note        Assessment:  Allison Hall is a 83 y.o. female with multi-focal right breast cancer s/p partial mastectomy with sentinel lymph node biopsy on 09/29/2016.  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  Three sentinel lymph nodes were negative.  Tumor was ER + (> 90%), PR + (> 90%) and Her2/neu 2+ (negative by FISH).  Pathologic stage was T1c(m) N0(sn).  CA27.29 was 13.4 on 09/06/2016.   MammaPrint revealed a 29% risk of recurrence (high).  She was seen for second opinion at [facility] on 10/25/2016.  Chemotherapy was not recommended.   Bilateral screening mammogram on 07/24/2016 revealed a possible area of distortion in the right breast. Diagnostic mammogram on 08/10/2016 revealed 2 suspicious masses in the upper-outer quadrant of the right breast in a broad area of confluent distortion in the upper-outer quadrant of the right breast.  There are at least 3 areas of confluent distortion spanning approximately 6.5 cm of tissue.  Ultrasound on 07/31/2016 revealed  an irregular hypoechoic 1.2 x 1.1 x 1.2 cm at the 9:30 position 7 cm from the nipple.  In addition, there was an irregular 1.9 x 0.8 x 1.2 cm mass at the 10 o'clock position 5 cm from the nipple.  There were multiple small normal-appearing lymph nodes.   Breast MRI on 09/16/2016 revealed an irregular 1.6 x 1.2 x 1.6 cm enhancing mass adjacent to the clip artifact within the upper, outer right breast, middle depth, corresponding to the mass seen at 10 o'clock. There was an additional irregular  enhancing mass adjacent to the more posterior clip artifact measuring 2.6 x 1.4 x 1.6 cm, corresponding  to the mass seen at 9:30 position. Together, these 2 masses span 6.3 cm of breast tissue from anterior to posterior. There was no definite additional suspicious area of enhancement within the right breast to correspond to the third area of distortion identified on diagnostic mammogram dated 08/10/2016.  Left breast revealed no suspicious mass or enhancement.   Bilateral diagnostic mammogram on 09/03/2019 revealed no evidence of malignancy.   She received radiation from 11/15/2016 - 01/08/2017.  She was Femara  (01/17/2017 - 05/16/2017) secondary to arthralgias.  She discontinued Arimidex  after 19 days (last 06/14/2017) secondary to fatigue and polyarthralgias.  She began Aromasin  on 06/27/2017. STOPPED AROMASIN  MARCH 2024 [arthralgias hair loss]   CA27.29 has been followed:  13.4 on 09/06/2016, 13.0 on 05/16/2017, 17.1 on 08/29/2017, 12.1 on 06/04/2018, 13.8 on 10/01/2018, and 15.6 on 02/04/2019.   Bone density study on 09/30/2015 revealed osteopenia with a T-score of -2.1 in the right femoral neck.  Bone density study revealed osteoporosis on 10/02/2017 with a T-score of -3.1. She began Prolia  on 02/14/2017 (last 10/09/2019).   She was diagnosed with iron  deficiency anemia and B12 deficiency on 08/29/2017.  TSH and folate were normal.  She is on oral B12 500 mcg a day.  Intrinsic factor and anti-parietal cell antibodies were normal on 01/02/2018.  B12 was 888 on 01/29/2018 and 544 on 02/04/2019.   She received Venofer  weekly x 3 (10/31/2017 - 11/13/2017) and x 4 (06/05/2018 - 06/26/2018).   Ferritin  has been followed: 10 on 08/29/2017, 174 on 12/12/2017, 74 on 01/02/2018, 64 on 01/29/2018, 3 on 06/04/2018, 205 on 10/01/2018, and 162 on 02/04/2019.   EGD and colonoscopy on 12/03/2017. Polyps were noted. Pathology was negative for dysplasia and malignancy.    She tested positive for COVID-19 on  02/14/2020.   Symptomatically, she is doing well.  She denies any breast concerns.  She has some hair thinning.  Exam reveals no evidence of recurrent disease.   Pl   Carcinoma of upper-outer quadrant of right breast in female, estrogen receptor positive (HCC)  08/24/2016 Initial Diagnosis   Carcinoma of upper-outer quadrant of breast in female, estrogen receptor positive (HCC)     HISTORY OF PRESENTING ILLNESS: Alone.  Ambulating independently.  Allison Hall 83 y.o.  female patient with stage I multifocal ER/PR positive HER2 negative breast cancer Mammaprint-high risk currently  on surveillance- is here for a follow up.  Discussed the use of AI scribe software for clinical note transcription with the patient, who gave verbal consent to proceed.  History of Present Illness   Allison Hall is an 83 year old female who presents for follow-up regarding her bone health and anemia management.  She has discontinued calcium  supplements due to previously elevated calcium  levels. She is currently taking vitamin B12 and iron  supplements for deficiencies. She does not consume red meat, which may contribute to her low iron  levels.  She is planning to have knee replacement surgery in December because her knee pain has become severe enough that she is willing to proceed with surgery, despite previously stating she would not undergo another knee replacement. The pain has become severe enough to consider surgery, despite previously stating she would not undergo another knee replacement.  She had a mammogram in August, which was normal. She underwent shoulder surgery in June, delaying dental procedures. Teeth extractions occurred in August or September, and dental implants are planned for January.  She receives Prolia  injections every six months for osteoporosis management. She has previously received Reclast infusions.   Recent blood work shows normalized calcium  levels, slightly  low sodium, and a GFR of 58, which is slightly below normal. Her hemoglobin is low at 11, consistent with her anemia.    Review of Systems  Constitutional:  Positive for malaise/fatigue. Negative for chills, diaphoresis, fever and weight loss.  HENT:  Negative for nosebleeds and sore throat.   Eyes:  Negative for double vision.  Respiratory:  Negative for cough, hemoptysis, sputum production, shortness of breath and wheezing.   Cardiovascular:  Negative for chest pain, palpitations, orthopnea and leg swelling.  Gastrointestinal:  Negative for abdominal pain, blood in stool, constipation, diarrhea, heartburn, melena, nausea and vomiting.  Genitourinary:  Negative for dysuria, frequency and urgency.  Musculoskeletal:  Positive for back pain and joint pain.  Skin: Negative.  Negative for itching and rash.  Neurological:  Negative for dizziness, tingling, focal weakness, weakness and headaches.  Endo/Heme/Allergies:  Does not bruise/bleed easily.       Hair loss.  Psychiatric/Behavioral:  Negative for depression. The patient is not nervous/anxious and does not have insomnia.      MEDICAL HISTORY:  Past Medical History:  Diagnosis Date   Anemia    Anxiety    Breast cancer of upper-outer quadrant of right female breast (HCC) 08/24/2016   Colon polyp    COVID-19 02/14/2020   received treatment   Depression    GERD (gastroesophageal reflux disease)    Hypercholesteremia  Hypertension    Osteoarthritis    Personal history of radiation therapy 2018   RIGHT BREAST CA UOQ   Primary osteoarthritis of right shoulder    Scoliosis of lumbar spine    Skin cancer    Thyroid  nodule 04/17/2016    SURGICAL HISTORY: Past Surgical History:  Procedure Laterality Date   ABDOMINAL HYSTERECTOMY     APPENDECTOMY     BICEPT TENODESIS Right 09/11/2023   Procedure: TENODESIS, BICEPS;  Surgeon: Edie Norleen PARAS, MD;  Location: [facility];  Service: Orthopedics;  Laterality: Right;   BREAST BIOPSY Right  08/24/2016   9:30 - invasive mammary carcinoma with lobular features   BREAST BIOPSY Right 08/24/2016   10:00 - invasive mammary carcinoma with lobular features   BREAST LUMPECTOMY Right 10/09/2016   INVASIVE CARCINOMA, TUBULO-LOBULAR VARIANT, CLEAR MARGINS, NEGATIVE LN'S   CATARACT EXTRACTION W/PHACO Left 04/06/2020   Procedure: CATARACT EXTRACTION PHACO AND INTRAOCULAR LENS PLACEMENT (IOC) LEFT;  Surgeon: Jaye Fallow, MD;  Location: [facility];  Service: Ophthalmology;  Laterality: Left;  5.49 0:33.9   CATARACT EXTRACTION W/PHACO Right 04/20/2020   Procedure: CATARACT EXTRACTION PHACO AND INTRAOCULAR LENS PLACEMENT (IOC) RIGHT;  Surgeon: Jaye Fallow, MD;  Location: [facility];  Service: Ophthalmology;  Laterality: Right;  7.26 0:47.2   COLONOSCOPY W/ POLYPECTOMY  2003, 2008, 2014   COLONOSCOPY WITH PROPOFOL  N/A 12/03/2017   Procedure: COLONOSCOPY WITH PROPOFOL ;  Surgeon: Viktoria Lamar DASEN, MD;  Location: [facility];  Service: Endoscopy;  Laterality: N/A;   ESOPHAGOGASTRODUODENOSCOPY     foreign body   ESOPHAGOGASTRODUODENOSCOPY (EGD) WITH PROPOFOL  N/A 12/03/2017   Procedure: ESOPHAGOGASTRODUODENOSCOPY (EGD) WITH PROPOFOL ;  Surgeon: Viktoria Lamar DASEN, MD;  Location: [facility];  Service: Endoscopy;  Laterality: N/A;   JOINT REPLACEMENT Right 2008   TKR   NOSE SURGERY     PARTIAL MASTECTOMY WITH NEEDLE LOCALIZATION Right 09/29/2016   Procedure: PARTIAL MASTECTOMY WITH NEEDLE LOCALIZATION;  Surgeon: Claudene Larinda Bolder, MD;  Location: [facility];  Service: General;  Laterality: Right;   REPLACEMENT TOTAL KNEE Right 2008   REVERSE SHOULDER ARTHROPLASTY Right 09/11/2023   Procedure: ARTHROPLASTY, SHOULDER, TOTAL, REVERSE;  Surgeon: Edie Norleen PARAS, MD;  Location: [facility];  Service: Orthopedics;  Laterality: Right;   SENTINEL NODE BIOPSY Right 09/29/2016   Procedure: SENTINEL NODE BIOPSY;  Surgeon: Claudene Larinda Bolder, MD;  Location: [facility];  Service: General;   Laterality: Right;    SOCIAL HISTORY: Social History   Socioeconomic History   Marital status: Widowed    Spouse name: Not on file   Number of children: Not on file   Years of education: Not on file   Highest education level: Not on file  Occupational History   Not on file  Tobacco Use   Smoking status: Former    Current packs/day: 0.00    Average packs/day: 1 pack/day for 10.0 years (10.0 ttl pk-yrs)    Types: Cigarettes    Start date: 05/19/1955    Quit date: 05/18/1965    Years since quitting: 58.7   Smokeless tobacco: Never  Vaping Use   Vaping status: Never Used  Substance and Sexual Activity   Alcohol use: Yes    Alcohol/week: 7.0 standard drinks of alcohol    Types: 7 Glasses of wine per week    Comment: daily   Drug use: No   Sexual activity: Not Currently  Other Topics Concern   Not on file  Social History Narrative   Daughter lives with her  Social Drivers of Corporate Investment Banker Strain: Low Risk  (01/18/2024)   Received from [facility]   Overall Financial Resource Strain (CARDIA)    Difficulty of Paying Living Expenses: Not hard at all  Food Insecurity: No Food Insecurity (01/18/2024)   Received from [facility]   Hunger Vital Sign    Within the past 12 months, you worried that your food would run out before you got the money to buy more.: Never true    Within the past 12 months, the food you bought just didn't last and you didn't have money to get more.: Never true  Transportation Needs: No Transportation Needs (01/18/2024)   Received from [facility] - Transportation    In the past 12 months, has lack of transportation kept you from medical appointments or from getting medications?: No    Lack of Transportation (Non-Medical): No  Physical Activity: Inactive (12/03/2017)   Exercise Vital Sign    Days of Exercise per Week: 0 days    Minutes of Exercise per Session: 0 min  Stress:  No Stress Concern Present (12/03/2017)   [employer] [facility] [employer]ational [employer] - Occupational Stress Questionnaire    Feeling of Stress : Only a little  Social Connections: Unknown (09/11/2023)   Social Connection and Isolation Panel    Frequency of Communication with Friends and Family: Patient declined    Frequency of Social Gatherings with Friends and Family: Not on file    Attends Religious Services: Patient declined    Active Member of Clubs or Organizations: Patient declined    Attends Banker Meetings: Patient declined    Marital Status: Patient declined  Intimate Partner Violence: Patient Declined (09/11/2023)   Humiliation, Afraid, Rape, and Kick questionnaire    Fear of Current or Ex-Partner: Patient declined    Emotionally Abused: Patient declined    Physically Abused: Patient declined    Sexually Abused: Patient declined    FAMILY HISTORY: Family History  Problem Relation Age of Onset   Hypertension Mother    Depression Mother    Heart failure Father    Obesity Brother    Heart disease Brother    Stroke Brother    Depression Brother    Breast cancer Cousin    Breast cancer Cousin    Breast cancer Cousin     ALLERGIES:  is allergic to sulfamethoxazole, other, statins, trazodone , ciprofloxacin, and sulfa antibiotics.  MEDICATIONS:  Current Outpatient Medications  Medication Sig Dispense Refill   Acetaminophen  (TYLENOL  ARTHRITIS PAIN PO) Take 650 mg by mouth once.     aspirin  EC 325 MG tablet Take 1 tablet (325 mg total) by mouth daily. 30 tablet 0   carvedilol  (COREG ) 6.25 MG tablet Take 6.25 mg by mouth 2 (two) times daily with a meal.     Cholecalciferol  (VITAMIN D3) 1000 units CAPS Take 1,000 Units by mouth daily.     cyanocobalamin (VITAMIN B12) 1000 MCG tablet Take 1,000 mcg by mouth daily.     ferrous sulfate 324 MG TBEC Take 324 mg by mouth.     lisinopril  (ZESTRIL ) 40 MG tablet Take 40 mg by mouth daily.     loperamide  (IMODIUM ) 2 MG capsule  Take 4 mg by mouth as needed for diarrhea or loose stools.     magnesium  gluconate (MAGONATE) 500 (27 Mg) MG TABS tablet Take 500 mg by mouth daily.     Multiple Vitamins-Minerals (EYE VITAMINS) CAPS Take  1 capsule by mouth daily. Vision MD     Multiple Vitamins-Minerals (HAIR SKIN AND NAILS FORMULA PO) Take 2 tablets by mouth daily.     Omega-3 Fatty Acids (FISH OIL) 1200 MG CAPS Take 1,200 mg by mouth daily.     omeprazole (PRILOSEC) 40 MG capsule Take 40 mg by mouth daily.     spironolactone  (ALDACTONE ) 25 MG tablet Take 25 mg by mouth daily.     acetaminophen  (TYLENOL ) 325 MG tablet Take 950 mg by mouth every 6 (six) hours as needed for moderate pain (pain score 4-6).     Calcium  Carb-Cholecalciferol  (CALCIUM  600 + D PO) Take 1 tablet by mouth daily.     HYDROcodone -acetaminophen  (NORCO/VICODIN) 5-325 MG tablet Take 1-2 tablets by mouth every 4 (four) hours as needed for moderate pain (pain score 4-6). 40 tablet 0   No current facility-administered medications for this visit.      SABRA  PHYSICAL EXAMINATION: ECOG PERFORMANCE STATUS: 0 - Asymptomatic  Vitals:   02/08/24 1324  BP: 114/70  Pulse: 73  Resp: 16  Temp: 98.2 F (36.8 C)  SpO2: 99%   Filed Weights   02/08/24 1324  Weight: 165 lb (74.8 kg)    Physical Exam Vitals and nursing note reviewed.  Constitutional:      Comments: Alone.      HENT:     Head: Normocephalic and atraumatic.     Mouth/Throat:     Pharynx: Oropharynx is clear.  Eyes:     Extraocular Movements: Extraocular movements intact.     Pupils: Pupils are equal, round, and reactive to light.  Cardiovascular:     Rate and Rhythm: Normal rate and regular rhythm.  Pulmonary:     Comments: Decreased breath sounds bilaterally.  Abdominal:     Palpations: Abdomen is soft.  Musculoskeletal:        General: Normal range of motion.     Cervical back: Normal range of motion.  Skin:    General: Skin is warm.  Neurological:     General: No focal  deficit present.     Mental Status: She is alert and oriented to person, place, and time.  Psychiatric:        Behavior: Behavior normal.        Judgment: Judgment normal.      LABORATORY DATA:  I have reviewed the data as listed Lab Results  Component Value Date   WBC 5.2 02/08/2024   HGB 11.0 (L) 02/08/2024   HCT 31.9 (L) 02/08/2024   MCV 92.5 02/08/2024   PLT 211 02/08/2024   Recent Labs    08/08/23 1503 09/12/23 0611 02/08/24 1258  NA 137 135 131*  K 4.3 3.7 4.2  CL 101 102 98  CO2 25 25 24   GLUCOSE 114* 152* 93  BUN 28* 18 28*  CREATININE 0.83 0.78 0.96  CALCIUM  9.6 9.0 9.8  GFRNONAA >60 >60 58*  PROT 7.1  --  7.2  ALBUMIN 4.6  --  4.5  AST 19  --  21  ALT 15  --  19  ALKPHOS 43  --  73  BILITOT 0.7  --  0.6    RADIOGRAPHIC STUDIES: I have personally reviewed the radiological images as listed and agreed with the findings in the report. No results found.  ASSESSMENT & PLAN:   Carcinoma of upper-outer quadrant of right breast in female, estrogen receptor positive (HCC) # JULY 2018- Multifocal RIGHT breast cancer ER Positive-stage I-high risk MammaPrint; no  chemotherapy; second opinion at [facility];Dr.Marcom].  OFF Aromasin - sec to MSK pain/hair loss [Holding since march 2024]; discontinue sec to arthralgia/hair loss patient preference.  Clinically no evidence of recurrence.  Mammogram AUG 2025 WNL.     Osteoporosis Reclast infusion management discussed. Delay required due to upcoming dental procedures. - Delay Reclast infusion until dental clearance is obtained. - Schedule Reclast infusion after dental clearance.  Iron  deficiency anemia Hemoglobin at 11. Iron  levels decreasing despite supplementation. - Continue iron  supplementation. - Encouraged dietary intake of iron -rich foods.  Vitamin B12 deficiency Managed with supplementation. - Continue Vitamin B12 supplementation.  Hyponatremia Mild hyponatremia with sodium at 131. No immediate  concerns.  Chronic kidney disease, stage 2 Stage 2 CKD with GFR at 58. Creatinine normal.  Planned right knee replacement for osteoarthritis Severe osteoarthritis necessitating knee replacement. Surgery considered for December. - Proceed with right knee replacement surgery as planned.     *mychart  Q56m- # DISPOSITION: # HOLD Reclast today # Follow up in Feb 2026 MD; labs- [facility] with diff, CMP, CA27.29], vit D levels; reclast; BMD-- Dr.B  Cc.Dr.Zachary Claudene-      All questions were answered. The patient knows to call the clinic with any problems, questions or concerns.    Cindy JONELLE Joe, MD 02/10/2024 6:13 PM

## 2024-02-09 LAB — CANCER ANTIGEN 27.29: CA 27.29: 15.6 U/mL (ref 0.0–38.6)

## 2024-02-10 ENCOUNTER — Encounter: Payer: Self-pay | Admitting: Internal Medicine

## 2024-02-10 ENCOUNTER — Ambulatory Visit: Payer: Self-pay | Admitting: Internal Medicine

## 2024-02-28 ENCOUNTER — Other Ambulatory Visit: Payer: Self-pay | Admitting: Surgery

## 2024-02-29 ENCOUNTER — Other Ambulatory Visit: Payer: Self-pay | Admitting: Gastroenterology

## 2024-02-29 DIAGNOSIS — R1310 Dysphagia, unspecified: Secondary | ICD-10-CM

## 2024-03-05 ENCOUNTER — Telehealth: Payer: Self-pay | Admitting: Internal Medicine

## 2024-03-05 NOTE — Telephone Encounter (Signed)
 pt wants to cancel appts in Feb. due to having a tooth implant and knee surgery. Appts are canceled and noted. Pt stated she will call back to r/s appts

## 2024-03-11 ENCOUNTER — Inpatient Hospital Stay: Admission: RE | Admit: 2024-03-11 | Discharge: 2024-03-11 | Attending: Surgery

## 2024-03-11 ENCOUNTER — Other Ambulatory Visit: Payer: Self-pay

## 2024-03-11 VITALS — BP 145/74 | HR 65 | Temp 98.4°F | Resp 16 | Ht 64.0 in | Wt 166.7 lb

## 2024-03-11 DIAGNOSIS — Z01818 Encounter for other preprocedural examination: Secondary | ICD-10-CM

## 2024-03-11 DIAGNOSIS — M25562 Pain in left knee: Secondary | ICD-10-CM | POA: Diagnosis not present

## 2024-03-11 DIAGNOSIS — G8929 Other chronic pain: Secondary | ICD-10-CM | POA: Diagnosis not present

## 2024-03-11 DIAGNOSIS — Z01812 Encounter for preprocedural laboratory examination: Secondary | ICD-10-CM

## 2024-03-11 HISTORY — DX: Disorder of adrenal gland, unspecified: E27.9

## 2024-03-11 HISTORY — DX: Unilateral primary osteoarthritis, left knee: M17.12

## 2024-03-11 HISTORY — DX: Spinal stenosis, site unspecified: M48.00

## 2024-03-11 HISTORY — DX: Chronic kidney disease, stage 3a: N18.31

## 2024-03-11 HISTORY — DX: Unspecified macular degeneration: H35.30

## 2024-03-11 HISTORY — DX: Age-related osteoporosis without current pathological fracture: M81.0

## 2024-03-11 HISTORY — DX: Other chronic pain: G89.29

## 2024-03-11 LAB — URINALYSIS, ROUTINE W REFLEX MICROSCOPIC
Bilirubin Urine: NEGATIVE
Glucose, UA: NEGATIVE mg/dL
Hgb urine dipstick: NEGATIVE
Ketones, ur: NEGATIVE mg/dL
Leukocytes,Ua: NEGATIVE
Nitrite: NEGATIVE
Protein, ur: NEGATIVE mg/dL
Specific Gravity, Urine: 1.01 (ref 1.005–1.030)
pH: 7 (ref 5.0–8.0)

## 2024-03-11 LAB — SURGICAL PCR SCREEN
MRSA, PCR: NEGATIVE
Staphylococcus aureus: NEGATIVE

## 2024-03-11 NOTE — Patient Instructions (Addendum)
 Your procedure is scheduled on: 03/25/24 - Tuesday Report to the Registration Desk on the 1st floor of the [facility]. To find out your arrival time, please call [phone] between 1PM - 3PM on: 03/24/24 - Monday If your arrival time is 6:00 am, do not arrive before that time as the [facility] entrance doors do not open until 6:00 am.  REMEMBER: Instructions that are not followed completely may result in serious medical risk, up to and including death; or upon the discretion of your surgeon and anesthesiologist your surgery may need to be rescheduled.  Do not eat food after midnight the night before surgery.  No gum chewing or hard candies.  You may however, drink CLEAR liquids up to 2 hours before you are scheduled to arrive for your surgery. Do not drink anything within 2 hours of your scheduled arrival time.  Clear liquids include: - water  - apple juice without pulp - gatorade (not RED colors) - black coffee or tea (Do NOT add milk or creamers to the coffee or tea) Do NOT drink anything that is not on this list.  In addition, your doctor has ordered for you to drink the provided:  Ensure Pre-Surgery Clear Carbohydrate Drink  Drinking this carbohydrate drink up to two hours before surgery helps to reduce insulin resistance and improve patient outcomes. Please complete drinking 2 hours before scheduled arrival time.  One week prior to surgery: Stop Anti-inflammatories (NSAIDS) such as Advil, Aleve, Ibuprofen, Motrin, Naproxen, Naprosyn and Aspirin  based products such as Excedrin, Goody's Powder, BC Powder. You may continue to take Tylenol  if needed for pain up until the day of surgery.  Stop ANY OVER THE COUNTER supplements until after surgery.   ON THE DAY OF SURGERY ONLY TAKE THESE MEDICATIONS WITH SIPS OF WATER:  carvedilol  (COREG )  celecoxib (CELEBREX)  omeprazole (PRILOSEC)   No Alcohol for 24 hours before or after surgery.  No Smoking including e-cigarettes for  24 hours before surgery.  No chewable tobacco products for at least 6 hours before surgery.  No nicotine patches on the day of surgery.  Do not use any recreational drugs for at least a week (preferably 2 weeks) before your surgery.  Please be advised that the combination of cocaine and anesthesia may have negative outcomes, up to and including death. If you test positive for cocaine, your surgery will be cancelled.  On the morning of surgery brush your teeth with toothpaste and water, you may rinse your mouth with mouthwash if you wish. Do not swallow any toothpaste or mouthwash.  Use CHG Soap or wipes as directed on instruction sheet.  Do not wear jewelry, make-up, hairpins, clips or nail polish.  For welded (permanent) jewelry: bracelets, anklets, waist bands, etc.  Please have this removed prior to surgery.  If it is not removed, there is a chance that hospital personnel will need to cut it off on the day of surgery.  Do not wear lotions, powders, or perfumes.   Do not shave body hair from the neck down 48 hours before surgery.  Contact lenses, hearing aids and dentures may not be worn into surgery.  Do not bring valuables to the hospital. [facility] is not responsible for any missing/lost belongings or valuables.   Notify your doctor if there is any change in your medical condition (cold, fever, infection).  Wear comfortable clothing (specific to your surgery type) to the hospital.  After surgery, you can help prevent lung complications by doing breathing  exercises.  Take deep breaths and cough every 1-2 hours. Your doctor may order a device called an Incentive Spirometer to help you take deep breaths.  If you are being admitted to the hospital overnight, leave your suitcase in the car. After surgery it may be brought to your room.  In case of increased patient census, it may be necessary for you, the patient, to continue your postoperative care in the Same Day Surgery  department.  If you are being discharged the day of surgery, you will not be allowed to drive home. You will need a responsible individual to drive you home and stay with you for 24 hours after surgery.   If you are taking public transportation, you will need to have a responsible individual with you.  Please call the Pre-admissions Testing Dept. at [phone] if you have any questions about these instructions.  Surgery Visitation Policy:  Patients having surgery or a procedure may have two visitors.  Children under the age of 65 must have an adult with them who is not the patient.  Inpatient Visitation:    Visiting hours are 7 a.m. to 8 p.m. Up to four visitors are allowed at one time in a patient room. The visitors may rotate out with other people during the day.  One visitor age 69 or older may stay with the patient overnight and must be in the room by 8 p.m.  Merchandiser, Retail to address health-related social needs:  [email or web address]    Pre-operative 4 CHG Bath Instructions   You can play a key role in reducing the risk of infection after surgery. Your skin needs to be as free of germs as possible. You can reduce the number of germs on your skin by washing with CHG (chlorhexidine  gluconate) soap before surgery. CHG is an antiseptic soap that kills germs and continues to kill germs even after washing.   DO NOT use if you have an allergy to chlorhexidine /CHG or antibacterial soaps. If your skin becomes reddened or irritated, stop using the CHG and notify one of our RNs at [phone].   Please shower with the CHG soap starting 4 days before surgery using the following schedule: 12/26 - 12/29.    Please keep in mind the following:  DO NOT shave, including legs and underarms, starting the day of your first shower.   You may shave your face at any point before/day of surgery.  Place clean sheets on your bed the day you start using CHG soap. Use a  clean washcloth (not used since being washed) for each shower. DO NOT sleep with pets once you start using the CHG.   CHG Shower Instructions:  If you choose to wash your hair and private area, wash first with your normal shampoo/soap.  After you use shampoo/soap, rinse your hair and body thoroughly to remove shampoo/soap residue.  Turn the water OFF and apply about 3 tablespoons (45 ml) of CHG soap to a CLEAN washcloth.  Apply CHG soap ONLY FROM YOUR NECK DOWN TO YOUR TOES (washing for 3-5 minutes)  DO NOT use CHG soap on face, private areas, open wounds, or sores.  Pay special attention to the area where your surgery is being performed.  If you are having back surgery, having someone wash your back for you may be helpful. Wait 2 minutes after CHG soap is applied, then you may rinse off the CHG soap.  Pat dry with a clean towel  Put on clean clothes/pajamas  If you choose to wear lotion, please use ONLY the CHG-compatible lotions on the back of this paper.     Additional instructions for the day of surgery: DO NOT APPLY any lotions, deodorants, cologne, or perfumes.   Put on clean/comfortable clothes.  Brush your teeth.  Ask your nurse before applying any prescription medications to the skin.      CHG Compatible Lotions   Aveeno Moisturizing lotion  Cetaphil Moisturizing Cream  Cetaphil Moisturizing Lotion  Clairol Herbal Essence Moisturizing Lotion, Dry Skin  Clairol Herbal Essence Moisturizing Lotion, Extra Dry Skin  Clairol Herbal Essence Moisturizing Lotion, Normal Skin  Curel Age Defying Therapeutic Moisturizing Lotion with Alpha Hydroxy  Curel Extreme Care Body Lotion  Curel Soothing Hands Moisturizing Hand Lotion  Curel Therapeutic Moisturizing Cream, Fragrance-Free  Curel Therapeutic Moisturizing Lotion, Fragrance-Free  Curel Therapeutic Moisturizing Lotion, Original Formula  Eucerin Daily Replenishing Lotion  Eucerin Dry Skin Therapy Plus Alpha Hydroxy Crme   Eucerin Dry Skin Therapy Plus Alpha Hydroxy Lotion  Eucerin Original Crme  Eucerin Original Lotion  Eucerin Plus Crme Eucerin Plus Lotion  Eucerin TriLipid Replenishing Lotion  Keri Anti-Bacterial Hand Lotion  Keri Deep Conditioning Original Lotion Dry Skin Formula Softly Scented  Keri Deep Conditioning Original Lotion, Fragrance Free Sensitive Skin Formula  Keri Lotion Fast Absorbing Fragrance Free Sensitive Skin Formula  Keri Lotion Fast Absorbing Softly Scented Dry Skin Formula  Keri Original Lotion  Keri Skin Renewal Lotion Keri Silky Smooth Lotion  Keri Silky Smooth Sensitive Skin Lotion  Nivea Body Creamy Conditioning Oil  Nivea Body Extra Enriched Lotion  Nivea Body Original Lotion  Nivea Body Sheer Moisturizing Lotion Nivea Crme  Nivea Skin Firming Lotion  NutraDerm 30 Skin Lotion  NutraDerm Skin Lotion  NutraDerm Therapeutic Skin Cream  NutraDerm Therapeutic Skin Lotion  ProShield Protective Hand Cream  Provon moisturizing lotion  How to Use an Incentive Spirometer  An incentive spirometer is a tool that measures how well you are filling your lungs with each breath. Learning to take long, deep breaths using this tool can help you keep your lungs clear and active. This may help to reverse or lessen your chance of developing breathing (pulmonary) problems, especially infection. You may be asked to use a spirometer: After a surgery. If you have a lung problem or a history of smoking. After a long period of time when you have been unable to move or be active. If the spirometer includes an indicator to show the highest number that you have reached, your health care provider or respiratory therapist will help you set a goal. Keep a log of your progress as told by your health care provider. What are the risks? Breathing too quickly may cause dizziness or cause you to pass out. Take your time so you do not get dizzy or light-headed. If you are in pain, you may need to take pain  medicine before doing incentive spirometry. It is harder to take a deep breath if you are having pain. How to use your incentive spirometer  Sit up on the edge of your bed or on a chair. Hold the incentive spirometer so that it is in an upright position. Before you use the spirometer, breathe out normally. Place the mouthpiece in your mouth. Make sure your lips are closed tightly around it. Breathe in slowly and as deeply as you can through your mouth, causing the piston or the ball to rise toward the top of the chamber. Hold your breath for 3-5 seconds, or  for as long as possible. If the spirometer includes a coach indicator, use this to guide you in breathing. Slow down your breathing if the indicator goes above the marked areas. Remove the mouthpiece from your mouth and breathe out normally. The piston or ball will return to the bottom of the chamber. Rest for a few seconds, then repeat the steps 10 or more times. Take your time and take a few normal breaths between deep breaths so that you do not get dizzy or light-headed. Do this every 1-2 hours when you are awake. If the spirometer includes a goal marker to show the highest number you have reached (best effort), use this as a goal to work toward during each repetition. After each set of 10 deep breaths, cough a few times. This will help to make sure that your lungs are clear. If you have an incision on your chest or abdomen from surgery, place a pillow or a rolled-up towel firmly against the incision when you cough. This can help to reduce pain while taking deep breaths and coughing. General tips When you are able to get out of bed: Walk around often. Continue to take deep breaths and cough in order to clear your lungs. Keep using the incentive spirometer until your health care provider says it is okay to stop using it. If you have been in the hospital, you may be told to keep using the spirometer at home. Contact a health care provider  if: You are having difficulty using the spirometer. You have trouble using the spirometer as often as instructed. Your pain medicine is not giving enough relief for you to use the spirometer as told. You have a fever. Get help right away if: You develop shortness of breath. You develop a cough with bloody mucus from the lungs. You have fluid or blood coming from an incision site after you cough. Summary An incentive spirometer is a tool that can help you learn to take long, deep breaths to keep your lungs clear and active. You may be asked to use a spirometer after a surgery, if you have a lung problem or a history of smoking, or if you have been inactive for a long period of time. Use your incentive spirometer as instructed every 1-2 hours while you are awake. If you have an incision on your chest or abdomen, place a pillow or a rolled-up towel firmly against your incision when you cough. This will help to reduce pain. Get help right away if you have shortness of breath, you cough up bloody mucus, or blood comes from your incision when you cough. This information is not intended to replace advice given to you by your health care provider. Make sure you discuss any questions you have with your health care provider. Document Revised: 06/02/2019 Document Reviewed: 06/02/2019 Elsevier Patient Education  2023 [employer]  03/12/24 :

## 2024-03-24 ENCOUNTER — Ambulatory Visit

## 2024-03-25 ENCOUNTER — Encounter
Admission: RE | Disposition: A | Discharge status: Skilled Nursing Facility | Payer: Self-pay | Source: Ambulatory Visit | Attending: Surgery

## 2024-03-25 ENCOUNTER — Other Ambulatory Visit: Payer: Self-pay

## 2024-03-25 ENCOUNTER — Observation Stay
Admission: RE | Admit: 2024-03-25 | Discharge: 2024-03-28 | Disposition: A | Discharge status: Skilled Nursing Facility | Source: Ambulatory Visit | Attending: Surgery | Admitting: Surgery

## 2024-03-25 ENCOUNTER — Other Ambulatory Visit (HOSPITAL_COMMUNITY): Payer: Self-pay

## 2024-03-25 ENCOUNTER — Encounter: Payer: Self-pay | Admitting: Surgery

## 2024-03-25 ENCOUNTER — Ambulatory Visit

## 2024-03-25 ENCOUNTER — Ambulatory Visit: Payer: Self-pay | Admitting: Urgent Care

## 2024-03-25 ENCOUNTER — Telehealth (HOSPITAL_COMMUNITY): Payer: Self-pay

## 2024-03-25 ENCOUNTER — Encounter: Payer: Self-pay | Admitting: Internal Medicine

## 2024-03-25 DIAGNOSIS — Z853 Personal history of malignant neoplasm of breast: Secondary | ICD-10-CM | POA: Diagnosis not present

## 2024-03-25 DIAGNOSIS — Z87891 Personal history of nicotine dependence: Secondary | ICD-10-CM | POA: Insufficient documentation

## 2024-03-25 DIAGNOSIS — Z8616 Personal history of COVID-19: Secondary | ICD-10-CM | POA: Insufficient documentation

## 2024-03-25 DIAGNOSIS — Z96611 Presence of right artificial shoulder joint: Secondary | ICD-10-CM | POA: Diagnosis not present

## 2024-03-25 DIAGNOSIS — N1831 Chronic kidney disease, stage 3a: Secondary | ICD-10-CM | POA: Diagnosis not present

## 2024-03-25 DIAGNOSIS — M1712 Unilateral primary osteoarthritis, left knee: Principal | ICD-10-CM | POA: Insufficient documentation

## 2024-03-25 DIAGNOSIS — Z85828 Personal history of other malignant neoplasm of skin: Secondary | ICD-10-CM | POA: Insufficient documentation

## 2024-03-25 DIAGNOSIS — I129 Hypertensive chronic kidney disease with stage 1 through stage 4 chronic kidney disease, or unspecified chronic kidney disease: Secondary | ICD-10-CM | POA: Insufficient documentation

## 2024-03-25 DIAGNOSIS — Z96652 Presence of left artificial knee joint: Principal | ICD-10-CM

## 2024-03-25 DIAGNOSIS — Z79899 Other long term (current) drug therapy: Secondary | ICD-10-CM | POA: Diagnosis not present

## 2024-03-25 HISTORY — PX: TOTAL KNEE ARTHROPLASTY: SHX125

## 2024-03-25 SURGERY — ARTHROPLASTY, KNEE, TOTAL
Anesthesia: Spinal | Site: Knee | Laterality: Left

## 2024-03-25 MED ORDER — STERILE WATER FOR IRRIGATION IR SOLN
Status: DC | PRN
  Administered 2024-03-25: 1000 mL

## 2024-03-25 MED ORDER — CEFAZOLIN SODIUM-DEXTROSE 2-4 GM/100ML-% IV SOLN
INTRAVENOUS | Status: AC
  Filled 2024-03-25: qty 100

## 2024-03-25 MED ORDER — DEXAMETHASONE SOD PHOSPHATE PF 10 MG/ML IJ SOLN
INTRAMUSCULAR | Status: DC | PRN
  Administered 2024-03-25: 5 mg via INTRAVENOUS

## 2024-03-25 MED ORDER — APIXABAN 2.5 MG PO TABS
2.5000 mg | ORAL_TABLET | Freq: Two times a day (BID) | ORAL | Status: DC
  Administered 2024-03-26 – 2024-03-28 (×5): 2.5 mg via ORAL
  Filled 2024-03-25 (×5): qty 1

## 2024-03-25 MED ORDER — FENTANYL CITRATE (PF) 100 MCG/2ML IJ SOLN
INTRAMUSCULAR | Status: AC
  Filled 2024-03-25: qty 2

## 2024-03-25 MED ORDER — OXYCODONE HCL 5 MG/5ML PO SOLN: 5.0000 mg | Freq: Once | ORAL | Status: DC | PRN

## 2024-03-25 MED ORDER — ONDANSETRON HCL 4 MG PO TABS: 4.0000 mg | ORAL_TABLET | Freq: Four times a day (QID) | ORAL | Status: DC | PRN

## 2024-03-25 MED ORDER — DIPHENHYDRAMINE HCL 12.5 MG/5ML PO ELIX: 12.5000 mg | ORAL_SOLUTION | ORAL | Status: DC | PRN

## 2024-03-25 MED ORDER — CEFAZOLIN SODIUM-DEXTROSE 2-4 GM/100ML-% IV SOLN
2.0000 g | Freq: Four times a day (QID) | INTRAVENOUS | Status: AC
  Administered 2024-03-25 (×2): 2 g via INTRAVENOUS
  Filled 2024-03-25: qty 100

## 2024-03-25 MED ORDER — HYDROMORPHONE HCL 1 MG/ML IJ SOLN: 0.2500 mg | INTRAMUSCULAR | Status: DC | PRN

## 2024-03-25 MED ORDER — FLEET ENEMA RE ENEM: 1.0000 | ENEMA | Freq: Once | RECTAL | Status: DC | PRN

## 2024-03-25 MED ORDER — ACETAMINOPHEN 325 MG PO TABS
325.0000 mg | ORAL_TABLET | Freq: Four times a day (QID) | ORAL | Status: DC | PRN
  Administered 2024-03-26 – 2024-03-27 (×3): 650 mg via ORAL
  Filled 2024-03-25 (×4): qty 2

## 2024-03-25 MED ORDER — TRIAMCINOLONE ACETONIDE 40 MG/ML IJ SUSP
INTRAMUSCULAR | Status: DC | PRN
  Administered 2024-03-25: 93 mL

## 2024-03-25 MED ORDER — OXYCODONE HCL 5 MG PO TABS: 5.0000 mg | ORAL_TABLET | Freq: Once | ORAL | Status: DC | PRN

## 2024-03-25 MED ORDER — FENTANYL CITRATE (PF) 100 MCG/2ML IJ SOLN: 25.0000 ug | INTRAMUSCULAR | Status: DC | PRN

## 2024-03-25 MED ORDER — TRAMADOL HCL 50 MG PO TABS
50.0000 mg | ORAL_TABLET | Freq: Four times a day (QID) | ORAL | Status: DC | PRN
  Administered 2024-03-25 – 2024-03-28 (×4): 50 mg via ORAL
  Filled 2024-03-25 (×8): qty 1

## 2024-03-25 MED ORDER — LOPERAMIDE HCL 2 MG PO CAPS: 4.0000 mg | ORAL_CAPSULE | ORAL | Status: DC | PRN

## 2024-03-25 MED ORDER — SODIUM CHLORIDE (PF) 0.9 % IJ SOLN
INTRAMUSCULAR | Status: AC
  Filled 2024-03-25: qty 40

## 2024-03-25 MED ORDER — BUPIVACAINE LIPOSOME 1.3 % IJ SUSP
INTRAMUSCULAR | Status: AC
  Filled 2024-03-25: qty 20

## 2024-03-25 MED ORDER — CEFAZOLIN SODIUM-DEXTROSE 2-4 GM/100ML-% IV SOLN
2.0000 g | INTRAVENOUS | Status: AC
  Administered 2024-03-25: 2 g via INTRAVENOUS

## 2024-03-25 MED ORDER — ACETAMINOPHEN 500 MG PO TABS
1000.0000 mg | ORAL_TABLET | Freq: Four times a day (QID) | ORAL | Status: AC
  Administered 2024-03-25 – 2024-03-26 (×2): 1000 mg via ORAL
  Filled 2024-03-25 (×5): qty 2

## 2024-03-25 MED ORDER — DOCUSATE SODIUM 100 MG PO CAPS
100.0000 mg | ORAL_CAPSULE | Freq: Two times a day (BID) | ORAL | Status: DC
  Administered 2024-03-25 – 2024-03-28 (×3): 100 mg via ORAL
  Filled 2024-03-25 (×5): qty 1

## 2024-03-25 MED ORDER — LISINOPRIL 20 MG PO TABS
ORAL_TABLET | ORAL | Status: AC
  Filled 2024-03-25: qty 2

## 2024-03-25 MED ORDER — SODIUM CHLORIDE 0.9 % IR SOLN
Status: DC | PRN
  Administered 2024-03-25: 3000 mL

## 2024-03-25 MED ORDER — PROPOFOL 10 MG/ML IV BOLUS
INTRAVENOUS | Status: DC | PRN
  Administered 2024-03-25 (×4): 20 mg via INTRAVENOUS
  Administered 2024-03-25: 40 ug/kg/min via INTRAVENOUS

## 2024-03-25 MED ORDER — CHLORHEXIDINE GLUCONATE 0.12 % MT SOLN
15.0000 mL | Freq: Once | OROMUCOSAL | Status: AC
  Administered 2024-03-25: 15 mL via OROMUCOSAL

## 2024-03-25 MED ORDER — TRANEXAMIC ACID-NACL 1000-0.7 MG/100ML-% IV SOLN
INTRAVENOUS | Status: AC
  Filled 2024-03-25: qty 100

## 2024-03-25 MED ORDER — ORAL CARE MOUTH RINSE: 15.0000 mL | Freq: Once | OROMUCOSAL | Status: AC

## 2024-03-25 MED ORDER — TRANEXAMIC ACID-NACL 1000-0.7 MG/100ML-% IV SOLN
1000.0000 mg | INTRAVENOUS | Status: AC
  Administered 2024-03-25: 1000 mg via INTRAVENOUS

## 2024-03-25 MED ORDER — CHLORHEXIDINE GLUCONATE 0.12 % MT SOLN
OROMUCOSAL | Status: AC
  Filled 2024-03-25: qty 15

## 2024-03-25 MED ORDER — ACETAMINOPHEN 10 MG/ML IV SOLN
INTRAVENOUS | Status: DC | PRN
  Administered 2024-03-25: 1000 mg via INTRAVENOUS

## 2024-03-25 MED ORDER — CARVEDILOL 3.125 MG PO TABS
6.2500 mg | ORAL_TABLET | Freq: Two times a day (BID) | ORAL | Status: DC
  Administered 2024-03-25 – 2024-03-26 (×2): 6.25 mg via ORAL
  Filled 2024-03-25 (×2): qty 2

## 2024-03-25 MED ORDER — PROPOFOL 1000 MG/100ML IV EMUL
INTRAVENOUS | Status: AC
  Filled 2024-03-25: qty 100

## 2024-03-25 MED ORDER — ONDANSETRON HCL 4 MG/2ML IJ SOLN: 4.0000 mg | Freq: Four times a day (QID) | INTRAMUSCULAR | Status: DC | PRN

## 2024-03-25 MED ORDER — MAGNESIUM HYDROXIDE 400 MG/5ML PO SUSP: 30.0000 mL | Freq: Every day | ORAL | Status: DC | PRN

## 2024-03-25 MED ORDER — LISINOPRIL 20 MG PO TABS
40.0000 mg | ORAL_TABLET | Freq: Every day | ORAL | Status: DC
  Administered 2024-03-25 – 2024-03-28 (×4): 40 mg via ORAL
  Filled 2024-03-25 (×2): qty 2

## 2024-03-25 MED ORDER — PHENYLEPHRINE HCL-NACL 20-0.9 MG/250ML-% IV SOLN
INTRAVENOUS | Status: DC | PRN
  Administered 2024-03-25: 40 ug/min via INTRAVENOUS

## 2024-03-25 MED ORDER — FENTANYL CITRATE (PF) 100 MCG/2ML IJ SOLN
INTRAMUSCULAR | Status: DC | PRN
  Administered 2024-03-25 (×2): 50 ug via INTRAVENOUS

## 2024-03-25 MED ORDER — KETOROLAC TROMETHAMINE 30 MG/ML IJ SOLN
INTRAMUSCULAR | Status: AC
  Filled 2024-03-25: qty 1

## 2024-03-25 MED ORDER — ONDANSETRON HCL 4 MG/2ML IJ SOLN
INTRAMUSCULAR | Status: AC
  Filled 2024-03-25: qty 2

## 2024-03-25 MED ORDER — METOCLOPRAMIDE HCL 5 MG/ML IJ SOLN: 5.0000 mg | Freq: Three times a day (TID) | INTRAMUSCULAR | Status: DC | PRN

## 2024-03-25 MED ORDER — TRIAMCINOLONE ACETONIDE 40 MG/ML IJ SUSP
INTRAMUSCULAR | Status: AC
  Filled 2024-03-25: qty 2

## 2024-03-25 MED ORDER — BUPIVACAINE HCL (PF) 0.5 % IJ SOLN
INTRAMUSCULAR | Status: AC
  Filled 2024-03-25: qty 10

## 2024-03-25 MED ORDER — METOCLOPRAMIDE HCL 10 MG PO TABS: 5.0000 mg | ORAL_TABLET | Freq: Three times a day (TID) | ORAL | Status: DC | PRN

## 2024-03-25 MED ORDER — SODIUM CHLORIDE 0.9 % IV SOLN: INTRAVENOUS | Status: AC

## 2024-03-25 MED ORDER — BISACODYL 10 MG RE SUPP: 10.0000 mg | Freq: Every day | RECTAL | Status: DC | PRN

## 2024-03-25 MED ORDER — LACTATED RINGERS IV SOLN: INTRAVENOUS | Status: DC

## 2024-03-25 MED ORDER — PANTOPRAZOLE SODIUM 40 MG PO TBEC
40.0000 mg | DELAYED_RELEASE_TABLET | Freq: Every day | ORAL | Status: DC
  Administered 2024-03-26 – 2024-03-28 (×3): 40 mg via ORAL
  Filled 2024-03-25 (×3): qty 1

## 2024-03-25 MED ORDER — ACETAMINOPHEN 10 MG/ML IV SOLN
INTRAVENOUS | Status: AC
  Filled 2024-03-25: qty 100

## 2024-03-25 MED ORDER — CELECOXIB 200 MG PO CAPS
200.0000 mg | ORAL_CAPSULE | Freq: Two times a day (BID) | ORAL | Status: DC
  Administered 2024-03-25 – 2024-03-28 (×6): 200 mg via ORAL
  Filled 2024-03-25 (×6): qty 1

## 2024-03-25 MED ORDER — BUPIVACAINE HCL (PF) 0.5 % IJ SOLN
INTRAMUSCULAR | Status: DC | PRN
  Administered 2024-03-25: 2.6 mL

## 2024-03-25 MED ORDER — BUPIVACAINE-EPINEPHRINE (PF) 0.5% -1:200000 IJ SOLN
INTRAMUSCULAR | Status: AC
  Filled 2024-03-25: qty 30

## 2024-03-25 MED ORDER — OXYCODONE HCL 5 MG PO TABS: 5.0000 mg | ORAL_TABLET | ORAL | Status: DC | PRN

## 2024-03-25 MED ORDER — SPIRONOLACTONE 25 MG PO TABS
25.0000 mg | ORAL_TABLET | Freq: Every day | ORAL | Status: DC
  Administered 2024-03-25 – 2024-03-28 (×4): 25 mg via ORAL
  Filled 2024-03-25 (×4): qty 1

## 2024-03-25 MED ORDER — ONDANSETRON HCL 4 MG/2ML IJ SOLN
INTRAMUSCULAR | Status: DC | PRN
  Administered 2024-03-25: 4 mg via INTRAVENOUS

## 2024-03-25 MED ORDER — PHENYLEPHRINE HCL-NACL 20-0.9 MG/250ML-% IV SOLN
INTRAVENOUS | Status: AC
  Filled 2024-03-25: qty 250

## 2024-03-25 MED ORDER — PROPOFOL 10 MG/ML IV BOLUS
INTRAVENOUS | Status: AC
  Filled 2024-03-25: qty 20

## 2024-03-25 SURGICAL SUPPLY — 49 items
BLADE SAW 90X13X1.19 OSCILLAT (BLADE) ×1 IMPLANT
BLADE SAW SAG 25X90X1.19 (BLADE) ×1 IMPLANT
BLADE SURG SZ20 CARB STEEL (BLADE) ×1 IMPLANT
BNDG COMPR 6X5.8 VLCR NS LF (GAUZE/BANDAGES/DRESSINGS) ×1 IMPLANT
CEMENT BONE R 1X40 (Cement) IMPLANT
CEMENT VACUUM MIXING SYSTEM (MISCELLANEOUS) ×1 IMPLANT
CHLORAPREP W/TINT 26 (MISCELLANEOUS) ×1 IMPLANT
COMPONET TIB PS KNEE D 0D LT (Joint) IMPLANT
COOLER ICEMAN CLASSIC (MISCELLANEOUS) ×1 IMPLANT
COVER MAYO STAND STRL (DRAPES) ×1 IMPLANT
CUFF TRNQT CYL 24X4X16.5-23 (TOURNIQUET CUFF) IMPLANT
DRAPE IMP U-DRAPE 54X76 (DRAPES) ×1 IMPLANT
DRAPE SHEET LG 3/4 BI-LAMINATE (DRAPES) ×1 IMPLANT
DRAPE U-SHAPE 47X51 STRL (DRAPES) ×1 IMPLANT
DRSG MEPILEX SACRM 8.7X9.8 (GAUZE/BANDAGES/DRESSINGS) IMPLANT
DRSG OPSITE POSTOP 4X10 (GAUZE/BANDAGES/DRESSINGS) IMPLANT
DRSG OPSITE POSTOP 4X8 (GAUZE/BANDAGES/DRESSINGS) IMPLANT
ELECT CAUTERY BLADE 6.4 (BLADE) ×1 IMPLANT
ELECTRODE REM PT RTRN 9FT ADLT (ELECTROSURGICAL) ×1 IMPLANT
FEMORAL KNEE COMP SZ 8STD LT (Knees) IMPLANT
GAUZE XEROFORM 1X8 LF (GAUZE/BANDAGES/DRESSINGS) ×1 IMPLANT
GLOVE BIO SURGEON STRL SZ7.5 (GLOVE) ×4 IMPLANT
GLOVE BIO SURGEON STRL SZ8 (GLOVE) ×4 IMPLANT
GLOVE BIOGEL PI IND STRL 8 (GLOVE) ×2 IMPLANT
GOWN STRL REUS W/ TWL LRG LVL3 (GOWN DISPOSABLE) IMPLANT
GOWN STRL REUS W/ TWL XL LVL3 (GOWN DISPOSABLE) ×1 IMPLANT
HOOD PEEL AWAY T7 (MISCELLANEOUS) ×3 IMPLANT
KIT TURNOVER KIT A (KITS) ×1 IMPLANT
LINER TIB PS CD/8-9 11 LT (Liner) IMPLANT
MANIFOLD NEPTUNE II (INSTRUMENTS) ×1 IMPLANT
NEEDLE SPNL 20GX3.5 QUINCKE YW (NEEDLE) ×1 IMPLANT
NS IRRIG 500ML POUR BTL (IV SOLUTION) ×1 IMPLANT
PACK TOTAL KNEE (MISCELLANEOUS) ×1 IMPLANT
PAD COLD UNI WRAP-ON (PAD) ×1 IMPLANT
PENCIL SMOKE EVACUATOR (MISCELLANEOUS) ×1 IMPLANT
PIN DRILL HDLS TROCAR 75 4PK (PIN) IMPLANT
SCREW FEMALE HEX FIX 25X2.5 (ORTHOPEDIC DISPOSABLE SUPPLIES) IMPLANT
SOL .9 NS 3000ML IRR UROMATIC (IV SOLUTION) ×1 IMPLANT
SOLN STERILE WATER 500 ML (IV SOLUTION) ×1 IMPLANT
STAPLER SKIN PROX 35W (STAPLE) ×1 IMPLANT
STEM POLY PAT PLY 35M KNEE (Knees) IMPLANT
STOCKINETTE IMPERV 14X48 (MISCELLANEOUS) ×1 IMPLANT
SUCTION TUBE FRAZIER 10FR DISP (SUCTIONS) ×1 IMPLANT
SUT VIC AB 0 CT1 36 (SUTURE) ×3 IMPLANT
SUT VIC AB 2-0 CT1 TAPERPNT 27 (SUTURE) ×3 IMPLANT
SYR 10ML LL (SYRINGE) ×1 IMPLANT
SYR 30ML LL (SYRINGE) IMPLANT
TIP FAN IRRIG PULSAVAC PLUS (DISPOSABLE) ×1 IMPLANT
TRAP FLUID SMOKE EVACUATOR (MISCELLANEOUS) ×1 IMPLANT

## 2024-03-25 NOTE — Anesthesia Procedure Notes (Signed)
 Procedure Name: MAC Date/Time: 03/25/2024 7:37 AM  Performed by: Lorrene Camelia LABOR, CRNAPre-anesthesia Checklist: Patient identified, Emergency Drugs available, Suction available and Patient being monitored Patient Re-evaluated:Patient Re-evaluated prior to induction Oxygen Delivery Method: Simple face mask Preoxygenation: Pre-oxygenation with 100% oxygen Induction Type: IV induction

## 2024-03-25 NOTE — Anesthesia Preprocedure Evaluation (Signed)
"                                    Anesthesia Evaluation  Patient identified by MRN, date of birth, ID band Patient awake    Reviewed: Allergy & Precautions, H&P , NPO status , Patient's Chart, lab work & pertinent test results, reviewed documented beta blocker date and time   History of Anesthesia Complications Negative for: history of anesthetic complications  Airway Mallampati: III  TM Distance: >3 FB Neck ROM: full    Dental  (+) Caps, Chipped, Poor Dentition, Dental Advidsory Given   Pulmonary neg pulmonary ROS, former smoker          Cardiovascular Exercise Tolerance: Good hypertension, (-) angina (-) CAD, (-) Past MI, (-) Cardiac Stents and (-) CABG negative cardio ROS (-) dysrhythmias (-) Valvular Problems/Murmurs     Neuro/Psych  PSYCHIATRIC DISORDERS Anxiety Depression    negative neurological ROS     GI/Hepatic Neg liver ROS,GERD  ,,  Endo/Other  negative endocrine ROS    Renal/GU      Musculoskeletal   Abdominal   Peds  Hematology negative hematology ROS (+)   Anesthesia Other Findings Past Medical History: No date: Anxiety No date: Cancer (HCC)     Comment: skin ca No date: Colon polyp No date: Depression No date: GERD (gastroesophageal reflux disease) No date: Hypercholesteremia No date: Hypertension No date: Osteoarthritis   Reproductive/Obstetrics negative OB ROS                              Anesthesia Physical Anesthesia Plan  ASA: 2  Anesthesia Plan: Spinal   Post-op Pain Management:    Induction: Intravenous  PONV Risk Score and Plan: 2 and Ondansetron , Dexamethasone , Propofol  infusion, TIVA and Midazolam   Airway Management Planned: Natural Airway and Nasal Cannula  Additional Equipment:   Intra-op Plan:   Post-operative Plan:   Informed Consent: I have reviewed the patients History and Physical, chart, labs and discussed the procedure including the risks, benefits and alternatives  for the proposed anesthesia with the patient or authorized representative who has indicated his/her understanding and acceptance.     Dental Advisory Given  Plan Discussed with: Anesthesiologist, CRNA and Surgeon  Anesthesia Plan Comments: (Patient reports no bleeding problems and no anticoagulant use.  Plan for spinal with backup GA  Patient consented for risks of anesthesia including but not limited to:  - adverse reactions to medications - damage to eyes, teeth, lips or other oral mucosa - nerve damage due to positioning  - risk of bleeding, infection and or nerve damage from spinal that could lead to paralysis - risk of headache or failed spinal - damage to teeth, lips or other oral mucosa - sore throat or hoarseness - damage to heart, brain, nerves, lungs, other parts of body or loss of life  Patient voiced understanding and assent.)        Anesthesia Quick Evaluation  "

## 2024-03-25 NOTE — Anesthesia Procedure Notes (Addendum)
 Spinal  Patient location during procedure: OR Start time: 03/25/2024 7:30 AM End time: 03/25/2024 7:35 AM Reason for block: surgical anesthesia  Staffing Performed: resident/CRNA  Authorized by: Leavy Ned, MD   Performed by: Lorrene Camelia LABOR, CRNA  Preanesthetic Checklist Completed: patient identified, IV checked, site marked, risks and benefits discussed, surgical consent, monitors and equipment checked, pre-op evaluation and timeout performed Spinal Block Patient position: sitting Prep: ChloraPrep Patient monitoring: heart rate, continuous pulse ox, blood pressure and cardiac monitor Approach: midline Location: L4-5 Injection technique: single-shot Needle Needle type: Introducer and Pencan  Needle gauge: 22 G Needle length: 9 cm Assessment Events: CSF return  Additional Notes Negative paresthesia. Negative blood return. Positive free-flowing CSF. Expiration date of kit checked and confirmed. Patient tolerated procedure well, without complications.
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
## 2024-03-25 NOTE — Telephone Encounter (Signed)
 Pharmacy Patient Advocate Encounter  Insurance verification completed.    The patient is insured through [facility]. Patient has Medicare and is not eligible for a copay card, but may be able to apply for patient assistance or Medicare RX Payment Plan (Patient Must reach out to their plan, if eligible for payment plan), if available.    Ran test claim for Eliquis  5mg  tablet and the current 30 day co-pay is $45.   This test claim was processed through [facility]- copay amounts may vary at other pharmacies due to [employer], or as the patient moves through the different stages of their insurance plan.

## 2024-03-25 NOTE — Transfer of Care (Signed)
 Immediate Anesthesia Transfer of Care Note  Patient: Allison Hall  Procedure(s) Performed: ARTHROPLASTY, KNEE, TOTAL (Left: Knee)  Patient Location: PACU  Anesthesia Type:MAC and Spinal  Level of Consciousness: awake and patient cooperative  Airway & Oxygen Therapy: Patient Spontanous Breathing  Post-op Assessment: Report given to RN and Post -op Vital signs reviewed and stable  Post vital signs: Reviewed and stable  Last Vitals:  Vitals Value Taken Time  BP 132/70 03/25/24 09:50  Temp 36.2 C 03/25/24 09:50  Pulse 67 03/25/24 09:51  Resp 12 03/25/24 09:51  SpO2 99 % 03/25/24 09:51  Vitals shown include unfiled device data.  Last Pain:  Vitals:   03/25/24 0950  TempSrc: Tympanic  PainSc:          Complications: No notable events documented.

## 2024-03-25 NOTE — Op Note (Signed)
 03/25/2024  9:41 AM  Patient:   Allison Hall  Pre-Op Diagnosis:   Degenerative joint disease, left knee.  Post-Op Diagnosis:   Same  Procedure:   Left TKA using all-cemented Zimmer Persona system with a #8 PCR femur, a(n) D-sized  tibial tray with an 11 mm medial congruent E-poly insert, and a 9 x 35 mm all-poly 3-pegged domed patella.  Surgeon:   DOROTHA Reyes Maltos, MD  Assistant:   Gustavo Level, PA-C   Anesthesia:   Spinal  Findings:   As above  Complications:   None  EBL:   20 cc  Fluids:   300 cc crystalloid  UOP:   None  TT:   90 minutes at 300 mmHg  Drains:   None  Closure:   Staples  Implants:   As above  Brief Clinical Note:   The patient is an 83 year old female with a long history of progressively worsening left knee pain. The patient's symptoms have progressed despite medications, activity modification, injections, etc. The patient's history and examination were consistent with advanced degenerative joint disease of the left knee confirmed by plain radiographs. The patient presents at this time for a left total knee arthroplasty.  Procedure:   The patient was brought into the operating room. After adequate spinal anesthesia was obtained, the patient was repositioned in the supine position on the operating room table. The left lower extremity was prepped with ChloraPrep solution and draped sterilely. Preoperative antibiotics were administered. A timeout was performed to verify the appropriate surgical site before the limb was exsanguinated with an Esmarch and the tourniquet inflated to 300 mmHg.   A standard anterior approach to the knee was made through an approximately 6-7 inch incision. The incision was carried down through the subcutaneous tissues to expose superficial retinaculum. This was split the length of the incision and the medial flap elevated sufficiently to expose the medial retinaculum. The medial retinaculum was incised, leaving a 3-4 mm cuff  of tissue on the patella. This was extended distally along the medial border of the patellar tendon and proximally through the medial third of the quadriceps tendon. A subtotal fat pad excision was performed before the soft tissues were elevated off the anteromedial and anterolateral aspects of the proximal tibia to the level of the collateral ligaments. The anterior portions of the medial and lateral menisci were removed, as was the anterior cruciate ligament. With the knee flexed to 90, the external tibial guide was positioned and the appropriate proximal tibial cut made. This piece was taken to the back table where it was measured and found to be optimally replicated by a(n) D-sized component.  Attention was directed to the distal femur. The intramedullary canal was accessed through a 3/8 drill hole. The intramedullary guide was inserted and placed at 5 of valgus alignment. Using the +0 slot, the distal cut was made. The distal femur was measured and found to be optimally replicated by the # 8 component. The # 8 4-in-1 cutting block was positioned and first the posterior, then the posterior chamfer, the anterior, and finally the anterior chamfer cuts were made after verifying that the anterior cortex would not be notched.   At this point, the posterior portions medial and lateral menisci were removed. A trial reduction was performed using the appropriate femoral and tibial components with first the 10 mm and then the 11 mm insert. The 11 mm insert demonstrated excellent stability to varus and valgus stressing both in flexion and extension while permitting  full extension. Patellar tracking was assessed and found to be excellent. Therefore, the tibial trial position was marked on the proximal tibia. The patella thickness was measured and found to be 20 mm. Therefore, the appropriate cut was made. The patellar surface was measured and found to be optimally replicated by the 35 mm component. The three peg  holes were drilled in place before the trial button was inserted. Patella tracking was assessed and found to be excellent, passing the no thumb test. The lug holes were drilled into the distal femur before the trial component was removed.  The tibial tray was repositioned before the keel was created using the appropriate tower, reamer, and punch.  The bony surfaces were prepared for cementing by irrigating them thoroughly with sterile saline solution via the jet lavage system. A bone plug was fashioned from some of the bone that had been removed previously and used to plug the distal femoral canal. In addition, a cocktail of 20 cc of Exparel , 30 cc of 0.5% Sensorcaine , 2 cc of Kenalog  40 (80 mg), and 30 mg of Toradol diluted out to 90 cc with normal saline was injected into the postero-medial and postero-lateral aspects of the knee, the medial and lateral gutter regions, and the peri-incisional tissues to help with postoperative analgesia. Meanwhile, the cement was being mixed on the back table.   When the cement was ready, the tibial tray was cemented in first. The excess cement was removed using Personal assistant. Next, the femoral component was impacted into place. Again, the excess cement was removed using Personal assistant. The 11 mm trial insert was positioned and the knee brought into extension while the cement hardened. Finally, the patella was cemented into place and secured using the patellar clamp. Again, the excess cement was removed using Personal assistant. Once the cement had hardened, the knee was placed through a range of motion with the findings as described above. Therefore, the trial insert was removed and, after verifying that no cement had been retained posteriorly, the permanent 11 mm medial congruent E-polyethylene insert was snapped into place with care taken to ensure appropriate locking of the insert. Again the knee was placed through a range of motion with the findings as described  above.  The wound was copiously irrigated with sterile saline solution using the jet lavage system before the quadriceps tendon and retinacular layer were reapproximated using #0 Vicryl interrupted sutures. The superficial retinacular layer also was closed using a running #0 Vicryl suture. The subcutaneous tissues were closed in several layers using 2-0 Vicryl interrupted sutures. The skin was closed using staples. A sterile honeycomb dressing was applied to the skin before the leg was wrapped with an Ace wrap to accommodate the Polar Care device. The patient was then awakened and returned to the recovery room in satisfactory condition after tolerating the procedure well.

## 2024-03-25 NOTE — Evaluation (Signed)
 Physical Therapy Evaluation Patient Details Name: Allison Hall MRN: [id number] DOB: 08/14/1940 Today's Date: 03/25/2024  History of Present Illness  83 y/o female s/p R TKA on 03/25/24. PMH: HTN, depression, CKD stage 3a, hx of breast cancer, iron  deficiency anemia, anxiety  Clinical Impression  Patient admitted following the above procedure. PTA, patient lives with daughter (who works during the day) and was modI with use of SPC. Patient presents with appropriate R LE weakness, impaired R knee ROM, and decreased activity tolerance. Completed bed mobility with supervision. Stood from bedside with CGA then ambulated to/from bathroom with RW and CGA. Patient demonstrates decreased heel strike on R and knee flexed in stance on R during ambulation. Patient requesting to go to rehab prior to returning home. Patient will benefit from skilled PT services during acute stay to address listed deficits. Patient will benefit from ongoing therapy at discharge to maximize functional independence and safety.         If plan is discharge home, recommend the following: A little help with walking and/or transfers;A little help with bathing/dressing/bathroom;Assistance with cooking/housework;Assist for transportation;Help with stairs or ramp for entrance   Can travel by private vehicle   Yes    Equipment Recommendations Other (comment) (TBD at next venue)  Recommendations for Other Services       Functional Status Assessment Patient has had a recent decline in their functional status and demonstrates the ability to make significant improvements in function in a reasonable and predictable amount of time.     Precautions / Restrictions Precautions Precautions: Fall Recall of Precautions/Restrictions: Intact Restrictions Weight Bearing Restrictions Per Provider Order: Yes RLE Weight Bearing Per Provider Order: Weight bearing as tolerated      Mobility  Bed Mobility Overal bed mobility: Needs  Assistance Bed Mobility: Supine to Sit, Sit to Supine     Supine to sit: Supervision Sit to supine: Supervision        Transfers Overall transfer level: Needs assistance Equipment used: Rolling Alysandra Lobue (2 wheels) Transfers: Sit to/from Stand Sit to Stand: Contact guard assist                Ambulation/Gait Ambulation/Gait assistance: Contact guard assist Gait Distance (Feet): 15 Feet (+15') Assistive device: Rolling Ernesto Lashway (2 wheels) Gait Pattern/deviations: Step-to pattern, Decreased stride length, Knee flexed in stance - right Gait velocity: decreased     General Gait Details: decreased heel strike on R with knee flexed in stance  Stairs            Wheelchair Mobility     Tilt Bed    Modified Rankin (Stroke Patients Only)       Balance Overall balance assessment: Needs assistance Sitting-balance support: No upper extremity supported, Feet supported Sitting balance-Leahy Scale: Good     Standing balance support: Bilateral upper extremity supported, Reliant on assistive device for balance Standing balance-Leahy Scale: Fair                               Pertinent Vitals/Pain Pain Assessment Pain Assessment: Faces Faces Pain Scale: Hurts whole lot Pain Location: R knee Pain Descriptors / Indicators: Grimacing, Guarding Pain Intervention(s): Limited activity within patient's tolerance, Monitored during session, Repositioned    Home Living Family/patient expects to be discharged to:: Private residence Living Arrangements: Children   Type of Home: House Home Access: Stairs to enter Entrance Stairs-Rails: Doctor, General Practice of Steps: 5   Home Layout: Multi-level  Prior Function Prior Level of Function : Independent/Modified Independent             Mobility Comments: Mod-I with SPC with household and short community distances ADLs Comments: Indep     Extremity/Trunk Assessment   Upper Extremity  Assessment Upper Extremity Assessment: Generalized weakness    Lower Extremity Assessment Lower Extremity Assessment: Generalized weakness;RLE deficits/detail RLE Deficits / Details: deficits consistent with post op pain and weakness       Communication   Communication Communication: No apparent difficulties    Cognition Arousal: Alert Behavior During Therapy: WFL for tasks assessed/performed   PT - Cognitive impairments: No apparent impairments                         Following commands: Intact       Cueing       General Comments      Exercises     Assessment/Plan    PT Assessment Patient needs continued PT services  PT Problem List Decreased strength;Decreased range of motion;Decreased activity tolerance;Decreased balance;Decreased mobility;Pain       PT Treatment Interventions Gait training;DME instruction;Functional mobility training;Therapeutic activities;Therapeutic exercise;Balance training;Neuromuscular re-education;Patient/family education    PT Goals (Current goals can be found in the Care Plan section)  Acute Rehab PT Goals Patient Stated Goal: to go to rehab before going home PT Goal Formulation: With patient Time For Goal Achievement: 04/08/24 Potential to Achieve Goals: Good    Frequency BID     Co-evaluation               AM-PAC PT 6 Clicks Mobility  Outcome Measure Help needed turning from your back to your side while in a flat bed without using bedrails?: A Little Help needed moving from lying on your back to sitting on the side of a flat bed without using bedrails?: A Little Help needed moving to and from a bed to a chair (including a wheelchair)?: A Little Help needed standing up from a chair using your arms (e.g., wheelchair or bedside chair)?: A Little Help needed to walk in hospital room?: A Little Help needed climbing 3-5 steps with a railing? : A Little 6 Click Score: 18    End of Session Equipment Utilized  During Treatment: Gait belt Activity Tolerance: Patient tolerated treatment well Patient left: in bed;with call bell/phone within reach Nurse Communication: Mobility status PT Visit Diagnosis: Unsteadiness on feet (R26.81);Muscle weakness (generalized) (M62.81);Difficulty in walking, not elsewhere classified (R26.2)    Time: 8492-8466 PT Time Calculation (min) (ACUTE ONLY): 26 min   Charges:   PT Evaluation $PT Eval Low Complexity: 1 Low PT Treatments $Therapeutic Activity: 8-22 mins PT General Charges $$ ACUTE PT VISIT: 1 Visit         Maryanne Finder, PT, DPT Physical Therapist - [facility]  [facility]   Jossiah Smoak A Renan Danese 03/25/2024, 4:13 PM

## 2024-03-26 ENCOUNTER — Encounter: Payer: Self-pay | Admitting: Surgery

## 2024-03-26 DIAGNOSIS — M1712 Unilateral primary osteoarthritis, left knee: Secondary | ICD-10-CM | POA: Diagnosis not present

## 2024-03-26 MED ORDER — LISINOPRIL 20 MG PO TABS
ORAL_TABLET | ORAL | Status: AC
  Filled 2024-03-26: qty 2

## 2024-03-26 MED ORDER — CARVEDILOL 12.5 MG PO TABS
12.5000 mg | ORAL_TABLET | Freq: Two times a day (BID) | ORAL | Status: DC
  Administered 2024-03-26 – 2024-03-28 (×5): 12.5 mg via ORAL
  Filled 2024-03-26 (×5): qty 1

## 2024-03-26 MED ORDER — OXYCODONE HCL 5 MG PO TABS: 5.0000 mg | ORAL_TABLET | ORAL | 0 refills | Status: DC | PRN

## 2024-03-26 MED ORDER — DOCUSATE SODIUM 100 MG PO CAPS: 100.0000 mg | ORAL_CAPSULE | Freq: Two times a day (BID) | ORAL | Status: DC

## 2024-03-26 MED ORDER — APIXABAN 2.5 MG PO TABS: 2.5000 mg | ORAL_TABLET | Freq: Two times a day (BID) | ORAL | Status: DC

## 2024-03-26 MED ORDER — TRAMADOL HCL 50 MG PO TABS: 50.0000 mg | ORAL_TABLET | Freq: Four times a day (QID) | ORAL | 0 refills | Status: DC | PRN

## 2024-03-26 MED ORDER — ONDANSETRON HCL 4 MG PO TABS: 4.0000 mg | ORAL_TABLET | Freq: Four times a day (QID) | ORAL | Status: DC | PRN

## 2024-03-26 NOTE — Discharge Summary (Incomplete)
 Physician Discharge Summary  Patient ID: Allison Hall MRN: [id number] DOB/AGE: 09/02/1940 83 y.o.  Admit date: 03/25/2024 Discharge date: 03/29/2023  Admission Diagnoses:  Primary osteoarthritis of left knee [M17.12] Status post total knee replacement using cement, left [Z96.652]   Discharge Diagnoses: Patient Active Problem List   Diagnosis Date Noted   Status post total knee replacement using cement, left 03/25/2024   Status post reverse total arthroplasty of right shoulder 09/11/2023   Foot pain, left 02/02/2023   Nondisplaced fracture of fourth metatarsal bone, left foot, initial encounter for closed fracture 02/02/2023   Elevated blood pressure reading 02/02/2023   History of hypertension 02/02/2023   Hair thinning 05/04/2020   Chronic pain of left knee 04/04/2018   Chronic pain syndrome 04/04/2018   Iron  deficiency anemia 10/31/2017   B12 deficiency 10/31/2017   Arthralgia 06/03/2017   Osteoporosis 10/04/2016   Carcinoma of upper-outer quadrant of right breast in female, estrogen receptor positive (HCC) 08/24/2016   Closed fracture of distal end of ulna 06/01/2015   Triggering of digit 05/27/2014   Hamstring muscle strain 05/26/2014   Arthritis of knee, degenerative 03/26/2014   Long term current use of opiate analgesic 04/21/2013   Arthritis, degenerative 04/21/2013   Anxiety 10/05/2011   Colon polyp 10/05/2011   Esophageal foreign body 10/05/2011   Acid reflux 10/05/2011   BP (high blood pressure) 10/05/2011   Hypercholesterolemia 10/05/2011    Past Medical History:  Diagnosis Date   Adrenal nodule    Anemia    Anxiety    Breast cancer of upper-outer quadrant of right female breast (HCC) 08/24/2016   Central stenosis of spinal canal    Chronic kidney disease, stage 3a (HCC)    Chronic pain of left knee    Colon polyp    COVID-19 02/14/2020   received treatment   Depression    GERD (gastroesophageal reflux disease)    Hypercholesteremia     Hypertension    Macular degeneration    Osteoarthritis    Osteoarthritis of left knee    Osteoporosis    Personal history of radiation therapy 2018   RIGHT BREAST CA UOQ   Primary osteoarthritis of left knee    Primary osteoarthritis of right shoulder    Scoliosis of lumbar spine    Skin cancer    Thyroid  nodule 04/17/2016     Transfusion: none   Consultants (if any):   Discharged Condition: Improved  Hospital Course: Allison Hall is an 83 y.o. female who was admitted 03/25/2024 with a diagnosis of Status post total knee replacement using cement, left and went to the operating room on 03/25/2024 and underwent the above named procedures.    Surgeries: Procedures: ARTHROPLASTY, KNEE, TOTAL on 03/25/2024 Patient tolerated the surgery well. Taken to PACU where she was stabilized and then transferred to the orthopedic floor.  Started on Eliquis, TEDs and SCDs applied bilaterally. Heels elevated on bed. No evidence of DVT. Negative Homan. Physical therapy started on day #1 for gait training and transfer. OT started day #1 for ADL and assisted devices.  Patient's IV was d/c on day #1. Patient was able to safely and independently complete all PT goals. PT recommending discharge to home.    On post op day #2 patient was stable and ready for discharge to SNF.  Implants:  Left TKA using all-cemented Zimmer Persona system with a #8 PCR femur, a(n) D-sized  tibial tray with an 11 mm medial congruent E-poly insert, and a 9 x 35 mm  all-poly 3-pegged domed patella.   She was given perioperative antibiotics:  Anti-infectives (From admission, onward)    Start     Dose/Rate Route Frequency Ordered Stop   03/25/24 1400  ceFAZolin  (ANCEF ) IVPB 2g/100 mL premix        2 g 200 mL/hr over 30 Minutes Intravenous Every 6 hours 03/25/24 1224 03/25/24 2032   03/25/24 0600  ceFAZolin  (ANCEF ) IVPB 2g/100 mL premix        2 g 200 mL/hr over 30 Minutes Intravenous On call to O.R. 03/25/24 0406  03/25/24 0755     .  She was given sequential compression devices, early ambulation, and Eliqiuis TEDs for DVT prophylaxis.  She benefited maximally from the hospital stay and there were no complications.    Recent vital signs:  Vitals:   03/27/24 2050 03/28/24 0520  BP: 135/61 137/72  Pulse: 66 64  Resp: 17 15  Temp: 98.5 F (36.9 C) 98.4 F (36.9 C)  SpO2: 98% 98%    Recent laboratory studies:  Lab Results  Component Value Date   HGB 11.0 (L) 02/08/2024   HGB 10.1 (L) 09/13/2023   HGB 9.9 (L) 09/12/2023   Lab Results  Component Value Date   WBC 5.2 02/08/2024   PLT 211 02/08/2024   No results found for: INR Lab Results  Component Value Date   NA 131 (L) 02/08/2024   K 4.2 02/08/2024   CL 98 02/08/2024   CO2 24 02/08/2024   BUN 28 (H) 02/08/2024   CREATININE 0.96 02/08/2024   GLUCOSE 93 02/08/2024    Discharge Medications:   Allergies as of 03/28/2024       Reactions   Sulfamethoxazole Other (See Comments)   Anxiety and insomnia   Other Itching   Vit C-vit E-copper-zinc-lutein - itching and fatigue (per [facility])   Statins Other (See Comments)   Muscle pain    Trazodone  Other (See Comments)   Pt states she felt unsteady    Ciprofloxacin Anxiety        Medication List     TAKE these medications    acetaminophen  650 MG CR tablet Commonly known as: TYLENOL  Take 1,300 mg by mouth every 8 (eight) hours as needed for pain.   apixaban 2.5 MG Tabs tablet Commonly known as: ELIQUIS Take 1 tablet (2.5 mg total) by mouth 2 (two) times daily.   carvedilol  6.25 MG tablet Commonly known as: COREG  Take 6.25 mg by mouth 2 (two) times daily with a meal.   celecoxib  200 MG capsule Commonly known as: CELEBREX  Take 200 mg by mouth 2 (two) times daily.   cyanocobalamin  1000 MCG tablet Commonly known as: VITAMIN B12 Take 1,000 mcg by mouth daily.   docusate sodium  100 MG capsule Commonly known as: COLACE Take 1 capsule (100 mg total) by mouth 2 (two)  times daily.   Eye Vitamins Caps Take 1 capsule by mouth daily. Vision MD   HAIR SKIN AND NAILS FORMULA PO Take 2 tablets by mouth daily.   ferrous sulfate 324 MG Tbec Take 324 mg by mouth daily with breakfast.   Fish Oil 1200 MG Caps Take 1,200 mg by mouth daily.   lisinopril  40 MG tablet Commonly known as: ZESTRIL  Take 40 mg by mouth daily.   loperamide  2 MG capsule Commonly known as: IMODIUM  Take 4 mg by mouth as needed for diarrhea or loose stools.   magnesium  gluconate 500 (27 Mg) MG Tabs tablet Commonly known as: MAGONATE Take 500 mg by mouth  at bedtime.   omeprazole 40 MG capsule Commonly known as: PRILOSEC Take 40 mg by mouth daily.   ondansetron  4 MG tablet Commonly known as: ZOFRAN  Take 1 tablet (4 mg total) by mouth every 6 (six) hours as needed for nausea.   oxyCODONE  5 MG immediate release tablet Commonly known as: Oxy IR/ROXICODONE  Take 1-2 tablets (5-10 mg total) by mouth every 4 (four) hours as needed for moderate pain (pain score 4-6).   spironolactone  25 MG tablet Commonly known as: ALDACTONE  Take 25 mg by mouth daily.   traMADol  50 MG tablet Commonly known as: ULTRAM  Take 1 tablet (50 mg total) by mouth every 6 (six) hours as needed for moderate pain (pain score 4-6) (Breakthrough pain). What changed:  when to take this reasons to take this   Vitamin D3 25 MCG (1000 UT) Caps Take 1,000 Units by mouth daily.               Durable Medical Equipment  (From admission, onward)           Start     Ordered   03/25/24 1225  DME 3 n 1  Once        03/25/24 1224   03/25/24 1225  DME Walker rolling  Once       Question Answer Comment  Walker: With 5 Inch Wheels   Patient needs a walker to treat with the following condition Status post total knee replacement using cement, left      03/25/24 1224            Diagnostic Studies: DG Knee Left Port Result Date: 03/25/2024 CLINICAL DATA:  Status post total knee replacement. EXAM:  PORTABLE LEFT KNEE - 1-2 VIEW COMPARISON:  None Available. FINDINGS: Left knee arthroplasty in expected alignment. No periprosthetic lucency or fracture. There has been patellar resurfacing. Recent postsurgical change includes air and edema in the soft tissues and joint space. Overlying skin staples in place. IMPRESSION: Left knee arthroplasty without immediate postoperative complication. Electronically Signed   By: Andrea Gasman M.D.   On: 03/25/2024 14:05    Disposition: Plan for discharge to [facility] today pending insurance auth.     Contact information for follow-up providers     Kip Lynwood Double, PA-C Follow up in 2 week(s).   Specialty: Physician Assistant Contact information: [address] [place] [address] [phone]              Contact information for after-discharge care     Destination     [facility] [place], [place]. [place]   Service: Skilled Nursing Contact information: [address] [place]  [address] [phone]                    Signed: Lynwood LITTIE Kip PA-C 03/28/2024, 7:58 AM

## 2024-03-26 NOTE — Discharge Instructions (Signed)
 "   Instructions after Total Knee Replacement        Dept. of Orthopaedics & Sports Medicine  [facility]  [address]  [place], [place]  [address]  Phone: [phone]   Fax: [phone]    DIET: Drink plenty of non-alcoholic fluids. Resume your normal diet. Include foods high in fiber.  ACTIVITY:  You may use crutches or a walker with weight-bearing as tolerated, unless instructed otherwise. You may be weaned off of the walker or crutches by your Physical Therapist.  Do NOT place pillows under the knee. Anything placed under the knee could limit your ability to straighten the knee.   Continue doing gentle exercises. Exercising will reduce the pain and swelling, increase motion, and prevent muscle weakness.   Please continue to use the TED compression stockings for 2 weeks. You may remove the stockings at night, but should reapply them in the morning. Do not drive or operate any equipment until instructed.  WOUND CARE:  Continue to use the PolarCare or ice packs periodically to reduce pain and swelling. You may begin showering 3 days after surgery with honeycomb dressing.   MEDICATIONS: You may resume your regular medications. Please take the pain medication as prescribed on the medication. Do not take pain medication on an empty stomach. Do not drive or drink alcoholic beverages when taking pain medications.  POSTOPERATIVE CONSTIPATION PROTOCOL Constipation - defined medically as fewer than three stools per week and severe constipation as less than one stool per week.  One of the most common issues patients have following surgery is constipation.  Even if you have a regular bowel pattern at home, your normal regimen is likely to be disrupted due to multiple reasons following surgery.  Combination of anesthesia, postoperative narcotics, change in appetite and fluid intake all can affect your bowels.  In order to avoid complications following surgery, here are some  recommendations in order to help you during your recovery period.  Colace (docusate) - Pick up an over-the-counter form of Colace or another stool softener and take twice a day as long as you are requiring postoperative pain medications.  Take with a full glass of water  daily.  If you experience loose stools or diarrhea, hold the colace until you stool forms back up.  If your symptoms do not get better within 1 week or if they get worse, check with your doctor.  Dulcolax (bisacodyl ) - Pick up over-the-counter and take as directed by the product packaging as needed to assist with the movement of your bowels.  Take with a full glass of water .  Use this product as needed if not relieved by Colace only.   MiraLax (polyethylene glycol) - Pick up over-the-counter to have on hand.  MiraLax is a solution that will increase the amount of water  in your bowels to assist with bowel movements.  Take as directed and can mix with a glass of water , juice, soda, coffee, or tea.  Take if you go more than two days without a movement. Do not use MiraLax more than once per day. Call your doctor if you are still constipated or irregular after using this medication for 7 days in a row.  If you continue to have problems with postoperative constipation, please contact the office for further assistance and recommendations.  If you experience the worst abdominal pain ever or develop nausea or vomiting, please contact the office immediatly for further recommendations for treatment.   CALL THE OFFICE FOR: Temperature above 101 degrees  Excessive bleeding or drainage on the dressing. Excessive swelling, coldness, or paleness of the toes. Persistent nausea and vomiting.  FOLLOW-UP:  You should have an appointment to return to the office in 14 days after surgery. Arrangements have been made for continuation of Physical Therapy (either home therapy or outpatient therapy).  "

## 2024-03-26 NOTE — Plan of Care (Signed)
" °  Problem: Education: Goal: Knowledge of the prescribed therapeutic regimen will improve Outcome: Progressing   Problem: Bowel/Gastric: Goal: Gastrointestinal status for postoperative course will improve Outcome: Progressing   Problem: Cardiac: Goal: Ability to maintain an adequate cardiac output Outcome: Progressing   Problem: Nutritional: Goal: Will attain and maintain optimal nutritional status Outcome: Progressing   Problem: Neurological: Goal: Will regain or maintain usual level of consciousness Outcome: Progressing   "

## 2024-03-26 NOTE — Progress Notes (Signed)
 Pt transferred to room 159. Nurse gave report to [place], RN. Pt belongings sent with pt. Pt's daughter and MD made aware of transfer.

## 2024-03-26 NOTE — Progress Notes (Signed)
" ° °  Subjective: 1 Day Post-Op Procedures (LRB): ARTHROPLASTY, KNEE, TOTAL (Left) Patient reports pain as mild.   Patient is well, and has had no acute complaints or problems Denies any CP, SOB, ABD pain. We will continue therapy today.  Plan is to go to SNF after hospital stay.  Objective: Vital signs in last 24 hours: Temp:  [97.4 F (36.3 C)-98.7 F (37.1 C)] 98.6 F (37 C) (12/31 1103) Pulse Rate:  [61-71] 64 (12/31 1103) Resp:  [15-18] 16 (12/31 1103) BP: (150-173)/(64-92) 150/92 (12/31 1103) SpO2:  [98 %-100 %] 100 % (12/31 1103)  Intake/Output from previous day: 12/30 0701 - 12/31 0700 In: 777.8 [I.V.:477.8; IV Piggyback:300] Out: 20 [Blood:20] Intake/Output this shift: No intake/output data recorded.  No results for input(s): HGB in the last 72 hours. No results for input(s): WBC, RBC, HCT, PLT in the last 72 hours. No results for input(s): NA, K, CL, CO2, BUN, CREATININE, GLUCOSE, CALCIUM  in the last 72 hours. No results for input(s): LABPT, INR in the last 72 hours.  EXAM General - Patient is Alert, Appropriate, and Oriented Extremity - Neurovascular intact Sensation intact distally Intact pulses distally Dorsiflexion/Plantar flexion intact Dressing - dressing C/D/I and no drainage Motor Function - intact, moving foot and toes well on exam.   Past Medical History:  Diagnosis Date   Adrenal nodule    Anemia    Anxiety    Breast cancer of upper-outer quadrant of right female breast (HCC) 08/24/2016   Central stenosis of spinal canal    Chronic kidney disease, stage 3a (HCC)    Chronic pain of left knee    Colon polyp    COVID-19 02/14/2020   received treatment   Depression    GERD (gastroesophageal reflux disease)    Hypercholesteremia    Hypertension    Macular degeneration    Osteoarthritis    Osteoarthritis of left knee    Osteoporosis    Personal history of radiation therapy 2018   RIGHT BREAST CA UOQ   Primary  osteoarthritis of left knee    Primary osteoarthritis of right shoulder    Scoliosis of lumbar spine    Skin cancer    Thyroid  nodule 04/17/2016    Assessment/Plan:   1 Day Post-Op Procedures (LRB): ARTHROPLASTY, KNEE, TOTAL (Left) Principal Problem:   Status post total knee replacement using cement, left  Estimated body mass index is 28.61 kg/m as calculated from the following:   Height as of this encounter: 5' 4 (1.626 m).   Weight as of this encounter: 75.6 kg. Advance diet Up with therapy Pain well controlled VSS CM to assist with dc to SNF   DVT Prophylaxis - TED hose and SCDs Eliquis Weight-Bearing as tolerated to left leg   T. Medford Amber, PA-C [facility] 03/26/2024, 12:17 PM   "

## 2024-03-26 NOTE — Progress Notes (Signed)
 Physical Therapy Treatment Patient Details Name: Allison Hall MRN: [id number] DOB: 1940/10/04 Today's Date: 03/26/2024   History of Present Illness 83 y/o female s/p R TKA on 03/25/24. PMH: HTN, depression, CKD stage 3a, hx of breast cancer, iron  deficiency anemia, anxiety    PT Comments  Pt was seated in recliner with RN and daughter present. She is A and O x 4. Agreeable to session and motivated. Pt + daughter reports pt lives alone and family works. Plan remains for DC to rehab from acute hospital stay. Pt was able to stand and ambulate with use of RW + assistance. She has poor gait posture and poor dorsiflexion of LLE to heel strike. No LOB or intervention required however pt remains far from her baseline abilities and will benefit from continued skilled PT to maximize her independence and safety with all ADLs. Author will return for BID/2nd session after lunch to progress ROM and strength. Pt was in recliner with call bell in reach at conclusion of PT session.      If plan is discharge home, recommend the following: A little help with walking and/or transfers;A little help with bathing/dressing/bathroom;Assistance with cooking/housework;Assistance with feeding;Direct supervision/assist for medications management;Direct supervision/assist for financial management;Assist for transportation;Help with stairs or ramp for entrance     Equipment Recommendations  Other (comment) (Defer to next level of care)       Precautions / Restrictions Precautions Precautions: Fall Recall of Precautions/Restrictions: Intact Restrictions Weight Bearing Restrictions Per Provider Order: Yes RLE Weight Bearing Per Provider Order: Weight bearing as tolerated     Mobility  Bed Mobility  General bed mobility comments: Pt was in recliner pre/post session    Transfers Overall transfer level: Needs assistance Equipment used: Rolling walker (2 wheels) Transfers: Sit to/from Stand Sit to Stand:  Contact guard assist  General transfer comment: CGA for safety with vcs for improved technique    Ambulation/Gait Ambulation/Gait assistance: Contact guard assist Gait Distance (Feet): 50 Feet Assistive device: Rolling walker (2 wheels) Gait Pattern/deviations: Step-to pattern, Antalgic, Trunk flexed Gait velocity: decreased  General Gait Details: Per pt's daughter, poor gait posture at baseline. pt has slow antalgic step to pattern with LLE foot drop. MD aware and reports confidence it will resolve within next two days as nerve block wears completely off.    Balance Overall balance assessment: Needs assistance Sitting-balance support: No upper extremity supported, Feet supported Sitting balance-Leahy Scale: Good     Standing balance support: Bilateral upper extremity supported, During functional activity, Reliant on assistive device for balance Standing balance-Leahy Scale: Fair Standing balance comment: reliant on RW and CGA for safety     Communication Communication Communication: No apparent difficulties  Cognition Arousal: Alert Behavior During Therapy: WFL for tasks assessed/performed   PT - Cognitive impairments: No apparent impairments    PT - Cognition Comments: Pt is A and O x 4 Following commands: Intact      Cueing Cueing Techniques: Verbal cues     General Comments General comments (skin integrity, edema, etc.): discussed importance of continuing to attempt dorsiflexion of L foot/ankle      Pertinent Vitals/Pain Pain Assessment Pain Assessment: No/denies pain Pain Score: 0-No pain Pain Location: R knee Pain Descriptors / Indicators: Discomfort Pain Intervention(s): Limited activity within patient's tolerance, Monitored during session, Premedicated before session, Repositioned     PT Goals (current goals can now be found in the care plan section) Acute Rehab PT Goals Patient Stated Goal: to go to rehab before going home  Progress towards PT goals:  Progressing toward goals    Frequency    BID       AM-PAC PT 6 Clicks Mobility   Outcome Measure  Help needed turning from your back to your side while in a flat bed without using bedrails?: A Little Help needed moving from lying on your back to sitting on the side of a flat bed without using bedrails?: A Little Help needed moving to and from a bed to a chair (including a wheelchair)?: A Little Help needed standing up from a chair using your arms (e.g., wheelchair or bedside chair)?: A Little Help needed to walk in hospital room?: A Little Help needed climbing 3-5 steps with a railing? : A Little 6 Click Score: 18    End of Session   Activity Tolerance: Patient tolerated treatment well Patient left: in chair;with call bell/phone within reach;with family/visitor present Nurse Communication: Mobility status PT Visit Diagnosis: Unsteadiness on feet (R26.81);Muscle weakness (generalized) (M62.81);Difficulty in walking, not elsewhere classified (R26.2)     Time: 9055-8989 PT Time Calculation (min) (ACUTE ONLY): 26 min  Charges:    $Gait Training: 8-22 mins $Therapeutic Activity: 8-22 mins PT General Charges $$ ACUTE PT VISIT: 1 Visit                     Rankin Essex PTA 03/26/2024, 10:25 AM

## 2024-03-26 NOTE — NC FL2 (Signed)
 " [place]  MEDICAID FL2 LEVEL OF CARE FORM     IDENTIFICATION  Patient Name: Allison Hall Birthdate: 28-Mar-1940 Sex: female Admission Date (Current Location): 03/25/2024  [facility] and [place] Number:  Chiropodist and Address:  [facility], [address], [place], [place] [address]      Provider Number: [id number]  Attending Physician Name and Address:  Edie Norleen PARAS, MD  Relative Name and Phone Number:       Current Level of Care: Hospital Recommended Level of Care: Skilled Nursing Facility Prior Approval Number:    Date Approved/Denied:   PASRR Number: [id number]  Discharge Plan: SNF    Current Diagnoses: Patient Active Problem List   Diagnosis Date Noted   Status post total knee replacement using cement, left 03/25/2024   Status post reverse total arthroplasty of right shoulder 09/11/2023   Foot pain, left 02/02/2023   Nondisplaced fracture of fourth metatarsal bone, left foot, initial encounter for closed fracture 02/02/2023   Elevated blood pressure reading 02/02/2023   History of hypertension 02/02/2023   Hair thinning 05/04/2020   Chronic pain of left knee 04/04/2018   Chronic pain syndrome 04/04/2018   Iron  deficiency anemia 10/31/2017   B12 deficiency 10/31/2017   Arthralgia 06/03/2017   Osteoporosis 10/04/2016   Carcinoma of upper-outer quadrant of right breast in female, estrogen receptor positive (HCC) 08/24/2016   Closed fracture of distal end of ulna 06/01/2015   Triggering of digit 05/27/2014   Hamstring muscle strain 05/26/2014   Arthritis of knee, degenerative 03/26/2014   Long term current use of opiate analgesic 04/21/2013   Arthritis, degenerative 04/21/2013   Anxiety 10/05/2011   Colon polyp 10/05/2011   Esophageal foreign body 10/05/2011   Acid reflux 10/05/2011   BP (high blood pressure) 10/05/2011   Hypercholesterolemia 10/05/2011    Orientation RESPIRATION BLADDER Height & Weight      Self, Time, Situation, Place  Normal Continent Weight: 166 lb 11.2 oz (75.6 kg) Height:  5' 4 (162.6 cm)  BEHAVIORAL SYMPTOMS/MOOD NEUROLOGICAL BOWEL NUTRITION STATUS   (None)  (None) Continent Diet (Heart healthy)  AMBULATORY STATUS COMMUNICATION OF NEEDS Skin   Limited Assist Verbally Surgical wounds (Incision on left knee: Honeycomb, compression wrap.)                       Personal Care Assistance Level of Assistance              Functional Limitations Info  Sight, Hearing, Speech Sight Info: Adequate Hearing Info: Adequate Speech Info: Adequate    SPECIAL CARE FACTORS FREQUENCY  PT (By licensed PT)     PT Frequency: 5 x week              Contractures Contractures Info: Not present    Additional Factors Info  Code Status, Allergies Code Status Info: Full code Allergies Info: Sulfamethoxazole, Other, Statins, Trazodone , Ciprofloxacin           Current Medications (03/26/2024):  This is the current hospital active medication list Current Facility-Administered Medications  Medication Dose Route Frequency Provider Last Rate Last Admin   acetaminophen  (TYLENOL ) tablet 1,000 mg  1,000 mg Oral Q6H Poggi, John J, MD   1,000 mg at 03/26/24 0035   acetaminophen  (TYLENOL ) tablet 325-650 mg  325-650 mg Oral Q6H PRN Poggi, John J, MD       apixaban WINN) tablet 2.5 mg  2.5 mg Oral BID Poggi, Norleen PARAS, MD   2.5  mg at 03/26/24 9057   bisacodyl  (DULCOLAX) suppository 10 mg  10 mg Rectal Daily PRN Poggi, John J, MD       carvedilol  (COREG ) tablet 12.5 mg  12.5 mg Oral BID WC Poggi, John J, MD       celecoxib  (CELEBREX ) capsule 200 mg  200 mg Oral BID Poggi, John J, MD   200 mg at 03/26/24 9057   diphenhydrAMINE  (BENADRYL ) 12.5 MG/5ML elixir 12.5-25 mg  12.5-25 mg Oral Q4H PRN Poggi, John J, MD       docusate sodium  (COLACE) capsule 100 mg  100 mg Oral BID Poggi, John J, MD   100 mg at 03/26/24 0942   HYDROmorphone (DILAUDID) injection 0.25-0.5 mg  0.25-0.5 mg  Intravenous Q3H PRN Poggi, John J, MD       lisinopril  (ZESTRIL ) tablet 40 mg  40 mg Oral Daily Poggi, John J, MD   40 mg at 03/26/24 9042   loperamide  (IMODIUM ) capsule 4 mg  4 mg Oral PRN Poggi, John J, MD       magnesium  hydroxide (MILK OF MAGNESIA) suspension 30 mL  30 mL Oral Daily PRN Poggi, John J, MD       metoCLOPramide  (REGLAN ) tablet 5-10 mg  5-10 mg Oral Q8H PRN Poggi, John J, MD       Or   metoCLOPramide  (REGLAN ) injection 5-10 mg  5-10 mg Intravenous Q8H PRN Poggi, John J, MD       ondansetron  (ZOFRAN ) tablet 4 mg  4 mg Oral Q6H PRN Poggi, John J, MD       Or   ondansetron  (ZOFRAN ) injection 4 mg  4 mg Intravenous Q6H PRN Poggi, John J, MD       oxyCODONE  (Oxy IR/ROXICODONE ) immediate release tablet 5-10 mg  5-10 mg Oral Q4H PRN Poggi, John J, MD       pantoprazole  (PROTONIX ) EC tablet 40 mg  40 mg Oral Daily Poggi, John J, MD   40 mg at 03/26/24 9057   sodium phosphate  (FLEET) enema 1 enema  1 enema Rectal Once PRN Poggi, Norleen PARAS, MD       spironolactone  (ALDACTONE ) tablet 25 mg  25 mg Oral Daily Poggi, John J, MD   25 mg at 03/26/24 9057   traMADol  (ULTRAM ) tablet 50 mg  50 mg Oral Q6H PRN Poggi, John J, MD   50 mg at 03/26/24 1420     Discharge Medications: Please see discharge summary for a list of discharge medications.  Relevant Imaging Results:  Relevant Lab Results:   Additional Information SS#: [id number]  Lauraine JAYSON Carpen, LCSW     "

## 2024-03-26 NOTE — Anesthesia Postprocedure Evaluation (Signed)
"   Anesthesia Post Note  Patient: ATALAYA ZAPPIA  Procedure(s) Performed: ARTHROPLASTY, KNEE, TOTAL (Left: Knee)  Patient location during evaluation: Nursing Unit Anesthesia Type: Spinal Level of consciousness: awake and alert and oriented Pain management: pain level controlled Vital Signs Assessment: post-procedure vital signs reviewed and stable Respiratory status: spontaneous breathing Cardiovascular status: stable Postop Assessment: no headache, no backache, able to ambulate, adequate PO intake, patient able to bend at knees and no apparent nausea or vomiting Anesthetic complications: no Comments: Pt complains of feeling like she is dragging her surgical side foot behind her while walking. Able to ambulate. No numbness. Unable to fully wiggle those toes. Dr. Edie aware. Consulted with MDA, who will go see her tomorrow to follow up.Pt made aware   No notable events documented.   Last Vitals:  Vitals:   03/26/24 0035 03/26/24 0715  BP: (!) 152/66 (!) 162/71  Pulse: 68 61  Resp:  17  Temp: 36.4 C 37.1 C  SpO2: 98% 100%    Last Pain:  Vitals:   03/26/24 0715  TempSrc: Oral  PainSc: 0-No pain                 Lorriane Romero FALCON      "

## 2024-03-26 NOTE — TOC Initial Note (Addendum)
 Transition of Care [facility]) - Initial/Assessment Note    Patient Details  Name: Allison Hall MRN: [id number] Date of Birth: 1941-03-03  [facility] CM/SW Contact:    Allison JAYSON Carpen, LCSW Phone Number: 03/26/2024, 2:32 PM  Clinical Narrative:   CSW met with patient. No family at bedside. CSW introduced role and explained that PT recommendations would be discussed. Patient is agreeable to SNF placement. First preference is [facility] because she was there this summer. She is also agreeable to checking with other facilities. No further concerns. CSW will continue to follow patient for support and facilitate discharge to SNF once medically stable.               3:27 pm: Patient accepted bed offer from [facility]. CSW started auth.  Expected Discharge Plan: Skilled Nursing Facility Barriers to Discharge: Continued Medical Work up   Patient Goals and CMS Choice            Expected Discharge Plan and Services     Post Acute Care Choice: Skilled Nursing Facility Living arrangements for the past 2 months: Single Family Home                                      Prior Living Arrangements/Services Living arrangements for the past 2 months: Single Family Home Lives with:: Adult Children Patient language and need for interpreter reviewed:: Yes Do you feel safe going back to the place where you live?: Yes      Need for Family Participation in Patient Care: Yes (Comment) Care giver support system in place?: Yes (comment)   Criminal Activity/Legal Involvement Pertinent to Current Situation/Hospitalization: No - Comment as needed  Activities of Daily Living   ADL Screening (condition at time of admission) Independently performs ADLs?: Yes (appropriate for developmental age) Is the patient deaf or have difficulty hearing?: No Does the patient have difficulty seeing, even when wearing glasses/contacts?: No Does the patient have difficulty  concentrating, remembering, or making decisions?: No  Permission Sought/Granted Permission sought to share information with : Facility Industrial/product Designer granted to share information with : Yes, Verbal Permission Granted     Permission granted to share info w AGENCY: SNF's        Emotional Assessment Appearance:: Appears stated age Attitude/Demeanor/Rapport: Engaged, Gracious Affect (typically observed): Accepting, Appropriate, Calm, Pleasant Orientation: : Oriented to Self, Oriented to Place, Oriented to  Time, Oriented to Situation Alcohol / Substance Use: Not Applicable Psych Involvement: No (comment)  Admission diagnosis:  Primary osteoarthritis of left knee [M17.12] Status post total knee replacement using cement, left [Z96.652] Patient Active Problem List   Diagnosis Date Noted   Status post total knee replacement using cement, left 03/25/2024   Status post reverse total arthroplasty of right shoulder 09/11/2023   Foot pain, left 02/02/2023   Nondisplaced fracture of fourth metatarsal bone, left foot, initial encounter for closed fracture 02/02/2023   Elevated blood pressure reading 02/02/2023   History of hypertension 02/02/2023   Hair thinning 05/04/2020   Chronic pain of left knee 04/04/2018   Chronic pain syndrome 04/04/2018   Iron  deficiency anemia 10/31/2017   B12 deficiency 10/31/2017   Arthralgia 06/03/2017   Osteoporosis 10/04/2016   Carcinoma of upper-outer quadrant of right breast in female, estrogen receptor positive (HCC) 08/24/2016   Closed fracture of distal end of ulna 06/01/2015   Triggering of digit 05/27/2014  Hamstring muscle strain 05/26/2014   Arthritis of knee, degenerative 03/26/2014   Long term current use of opiate analgesic 04/21/2013   Arthritis, degenerative 04/21/2013   Anxiety 10/05/2011   Colon polyp 10/05/2011   Esophageal foreign body 10/05/2011   Acid reflux 10/05/2011   BP (high blood pressure) 10/05/2011    Hypercholesterolemia 10/05/2011   PCP:  Claudene Arthea Sharper, MD Pharmacy:   [id number], [place] - [address] AT [facility] OF [address] [facility] [place] [address] Phone: [phone] Fax: [phone]     Social Drivers of Health (SDOH) Social History: SDOH Screenings   Food Insecurity: No Food Insecurity (03/25/2024)  Housing: Low Risk (03/25/2024)  Transportation Needs: No Transportation Needs (03/25/2024)  Utilities: Not At Risk (03/25/2024)  Depression (PHQ2-9): Low Risk (02/08/2024)  Financial Resource Strain: Low Risk  (01/18/2024)   Received from [facility]  Social Connections: Moderately Integrated (03/25/2024)  Tobacco Use: Medium Risk (03/25/2024)   SDOH Interventions:     Readmission Risk Interventions     No data to display

## 2024-03-26 NOTE — Progress Notes (Signed)
 Physical Therapy Treatment Patient Details Name: Allison Hall MRN: [id number] DOB: 03-Aug-1940 Today's Date: 03/26/2024   History of Present Illness 83 y/o female s/p R TKA on 03/25/24. PMH: HTN, depression, CKD stage 3a, hx of breast cancer, iron  deficiency anemia, anxiety    PT Comments  Pt was long sitting in recliner upon arrival. She remains A and O x 4. Agreeable to session and remains cooperative and motivated. Pt requested to urinate. Was able to safely ambulate to from BR with RW + slow gait cadence. LLE foot drop still present however less overall. Continued to encourage pt to perform active assistive dorsiflexion on LLE. After using the BR, returned to recliner and pt performed HEP handout + stretching. L knee AROM after stretching ~ 2-105 degrees. Pt is progress well however does not have assistance at DC. DC recs remain appropriate to maximize independence and safety with all ADLs.    If plan is discharge home, recommend the following: A little help with walking and/or transfers;A little help with bathing/dressing/bathroom;Assistance with cooking/housework;Assistance with feeding;Direct supervision/assist for medications management;Direct supervision/assist for financial management;Assist for transportation;Help with stairs or ramp for entrance     Equipment Recommendations  Other (comment) (Defer to next level of care)       Precautions / Restrictions Precautions Precautions: Fall Recall of Precautions/Restrictions: Intact Restrictions Weight Bearing Restrictions Per Provider Order: Yes RLE Weight Bearing Per Provider Order: Weight bearing as tolerated     Mobility  Bed Mobility  General bed mobility comments: Pt was in recliner pre/post session    Transfers Overall transfer level: Needs assistance Equipment used: Rolling walker (2 wheels) Transfers: Sit to/from Stand Sit to Stand: Contact guard assist, Min assist  General transfer comment: CGA-min for safety  with vcs for improved technique    Ambulation/Gait Ambulation/Gait assistance: Contact guard assist, Min assist Gait Distance (Feet): 20 Feet Assistive device: Rolling walker (2 wheels) Gait Pattern/deviations: Step-to pattern, Antalgic, Trunk flexed Gait velocity: decreased  General Gait Details: Pt ambulated 2 x 20 ft with RW. No LOB however pt self limits distance. She does endorse slightly more pain than earlier session.    Balance Overall balance assessment: Needs assistance Sitting-balance support: No upper extremity supported, Feet supported Sitting balance-Leahy Scale: Good     Standing balance support: Bilateral upper extremity supported, During functional activity, Reliant on assistive device for balance Standing balance-Leahy Scale: Fair Standing balance comment: reliant on RW and CGA for safety       Communication Communication Communication: No apparent difficulties  Cognition Arousal: Alert Behavior During Therapy: WFL for tasks assessed/performed   PT - Cognitive impairments: No apparent impairments   PT - Cognition Comments: Pt is A and O x 4 Following commands: Intact      Cueing Cueing Techniques: Verbal cues  Exercises Total Joint Exercises Ankle Circles/Pumps: AROM, Right, AAROM, Left, 10 reps Quad Sets: AROM, 10 reps Heel Slides: AROM, 10 reps Hip ABduction/ADduction: AROM, 10 reps Straight Leg Raises: AROM, 10 reps Goniometric ROM: ~1-105 degrees    General Comments General comments (skin integrity, edema, etc.): Issued HEP handout and pt tolerated and performed after successfully ambulating to BR to urinate      Pertinent Vitals/Pain Pain Assessment Pain Assessment: 0-10 Pain Score: 2  Pain Location: R knee Pain Descriptors / Indicators: Discomfort Pain Intervention(s): Limited activity within patient's tolerance, Premedicated before session, Repositioned, Patient requesting pain meds-RN notified, Ice applied     PT Goals (current goals  can now be found in the  care plan section) Acute Rehab PT Goals Patient Stated Goal: to go to rehab before going home Progress towards PT goals: Progressing toward goals    Frequency    BID       AM-PAC PT 6 Clicks Mobility   Outcome Measure  Help needed turning from your back to your side while in a flat bed without using bedrails?: A Little Help needed moving from lying on your back to sitting on the side of a flat bed without using bedrails?: A Little Help needed moving to and from a bed to a chair (including a wheelchair)?: A Little Help needed standing up from a chair using your arms (e.g., wheelchair or bedside chair)?: A Little Help needed to walk in hospital room?: A Little Help needed climbing 3-5 steps with a railing? : A Little 6 Click Score: 18    End of Session   Activity Tolerance: Patient tolerated treatment well Patient left: in chair;with call bell/phone within reach;with family/visitor present Nurse Communication: Mobility status PT Visit Diagnosis: Unsteadiness on feet (R26.81);Muscle weakness (generalized) (M62.81);Difficulty in walking, not elsewhere classified (R26.2)     Time: 8652-8585 PT Time Calculation (min) (ACUTE ONLY): 27 min  Charges:    $Gait Training: 8-22 mins $Therapeutic Exercise: 8-22 mins PT General Charges $$ ACUTE PT VISIT: 1 Visit                    Rankin Essex PTA 03/26/2024, 4:06 PM

## 2024-03-27 DIAGNOSIS — M1712 Unilateral primary osteoarthritis, left knee: Secondary | ICD-10-CM | POA: Diagnosis not present

## 2024-03-27 NOTE — Progress Notes (Signed)
" ° °  Subjective: 2 Days Post-Op Procedures (LRB): ARTHROPLASTY, KNEE, TOTAL (Left) Patient reports pain as mild.   Patient is well, and has had no acute complaints or problems Denies any CP, SOB, ABD pain. We will continue therapy today.  Plan is to go to SNF after hospital stay.  Objective: Vital signs in last 24 hours: Temp:  [98 F (36.7 C)-98.4 F (36.9 C)] 98 F (36.7 C) (01/01 0754) Pulse Rate:  [58-68] 68 (01/01 0754) Resp:  [16-18] 16 (01/01 0754) BP: (144-156)/(61-69) 156/65 (01/01 0754) SpO2:  [97 %-100 %] 99 % (01/01 0754)  Intake/Output from previous day: 12/31 0701 - 01/01 0700 In: 360 [P.O.:360] Out: -  Intake/Output this shift: No intake/output data recorded.  No results for input(s): HGB in the last 72 hours. No results for input(s): WBC, RBC, HCT, PLT in the last 72 hours. No results for input(s): NA, K, CL, CO2, BUN, CREATININE, GLUCOSE, CALCIUM  in the last 72 hours. No results for input(s): LABPT, INR in the last 72 hours.  EXAM General - Patient is Alert, Appropriate, and Oriented Extremity - Neurovascular intact Sensation intact distally Intact pulses distally Dorsiflexion/Plantar flexion intact Dressing - dressing C/D/I and no drainage Motor Function - intact, moving foot and toes well on exam.   Past Medical History:  Diagnosis Date   Adrenal nodule    Anemia    Anxiety    Breast cancer of upper-outer quadrant of right female breast (HCC) 08/24/2016   Central stenosis of spinal canal    Chronic kidney disease, stage 3a (HCC)    Chronic pain of left knee    Colon polyp    COVID-19 02/14/2020   received treatment   Depression    GERD (gastroesophageal reflux disease)    Hypercholesteremia    Hypertension    Macular degeneration    Osteoarthritis    Osteoarthritis of left knee    Osteoporosis    Personal history of radiation therapy 2018   RIGHT BREAST CA UOQ   Primary osteoarthritis of left knee     Primary osteoarthritis of right shoulder    Scoliosis of lumbar spine    Skin cancer    Thyroid  nodule 04/17/2016    Assessment/Plan:   2 Days Post-Op Procedures (LRB): ARTHROPLASTY, KNEE, TOTAL (Left) Principal Problem:   Status post total knee replacement using cement, left  Estimated body mass index is 28.61 kg/m as calculated from the following:   Height as of this encounter: 5' 4 (1.626 m).   Weight as of this encounter: 75.6 kg. Advance diet Up with therapy Pain well controlled VSS CM to assist with dc to SNF.  Patient is medically stable and ready for discharge pending insurance authorization   DVT Prophylaxis - TED hose and SCDs Eliquis Weight-Bearing as tolerated to left leg   T. Medford Amber, PA-C [facility] 03/27/2024, 11:29 AM   "

## 2024-03-27 NOTE — Progress Notes (Signed)
 Physical Therapy Treatment Patient Details Name: Allison Hall MRN: [id number] DOB: 09-Dec-1940 Today's Date: 03/27/2024   History of Present Illness 84 y/o female s/p R TKA on 03/25/24. PMH: HTN, depression, CKD stage 3a, hx of breast cancer, iron  deficiency anemia, anxiety    PT Comments  Pt received long sitting in recliner and agreeable to PT session. Noted improved L Dorsiflexion actively to neutral. Heel cord stretching and there ex prior to mobility with good tolerance. Increased distance tolerated with RW around nursing station with CGA. No significant L foot drop, however does have R foot drop compensated by hip hiking - pt states from a back injury earlier this year exacerbated after seeing a chiropractor. Will see pt again in pm to progress mobility. Currently awaiting STR.   If plan is discharge home, recommend the following: A little help with walking and/or transfers;A little help with bathing/dressing/bathroom;Assistance with cooking/housework;Assistance with feeding;Direct supervision/assist for medications management;Direct supervision/assist for financial management;Assist for transportation;Help with stairs or ramp for entrance   Can travel by private vehicle     Yes  Equipment Recommendations  Other (comment) (Defer to next level of care)    Recommendations for Other Services       Precautions / Restrictions Precautions Precautions: Fall Recall of Precautions/Restrictions: Intact Restrictions Weight Bearing Restrictions Per Provider Order: Yes RLE Weight Bearing Per Provider Order: Weight bearing as tolerated     Mobility  Bed Mobility               General bed mobility comments: Pt was in recliner pre/post session    Transfers Overall transfer level: Needs assistance Equipment used: Rolling walker (2 wheels) Transfers: Sit to/from Stand Sit to Stand: Contact guard assist, Min assist           General transfer comment: CGA for safety with  vcs for improved technique    Ambulation/Gait Ambulation/Gait assistance: Contact guard assist Gait Distance (Feet):  (160) Assistive device: Rolling walker (2 wheels) Gait Pattern/deviations: Step-to pattern, Antalgic, Trunk flexed, Decreased dorsiflexion - right, Decreased dorsiflexion - left Gait velocity: decreased     General Gait Details:  (Increased gait distance tolerated this date, L foot drop improved to active neutral)   Stairs             Wheelchair Mobility     Tilt Bed    Modified Rankin (Stroke Patients Only)       Balance Overall balance assessment: Needs assistance Sitting-balance support: No upper extremity supported, Feet supported Sitting balance-Leahy Scale: Good     Standing balance support: Bilateral upper extremity supported, During functional activity, Reliant on assistive device for balance Standing balance-Leahy Scale: Fair Standing balance comment: reliant on RW and CGA for safety                            Communication Communication Communication: No apparent difficulties  Cognition Arousal: Alert Behavior During Therapy: WFL for tasks assessed/performed   PT - Cognitive impairments: No apparent impairments                       PT - Cognition Comments: Pt is A and O x 4 Following commands: Intact      Cueing Cueing Techniques: Verbal cues  Exercises Total Joint Exercises Ankle Circles/Pumps: AROM, Right, AAROM, Left, 10 reps Quad Sets: AROM, 10 reps Long Arc Quad: AROM, Left, 10 reps, Seated Knee Flexion: AAROM, Left, 5 reps  General Comments General comments (skin integrity, edema, etc.):  (L dressing and ace wrap intact without drainage)      Pertinent Vitals/Pain Pain Assessment Pain Assessment: Faces Faces Pain Scale: Hurts little more Pain Location: R knee Pain Descriptors / Indicators: Discomfort Pain Intervention(s): Monitored during session, Premedicated before session    Home  Living                          Prior Function            PT Goals (current goals can now be found in the care plan section) Acute Rehab PT Goals Patient Stated Goal: to go to rehab before going home Progress towards PT goals: Progressing toward goals    Frequency    BID      PT Plan      Co-evaluation              AM-PAC PT 6 Clicks Mobility   Outcome Measure  Help needed turning from your back to your side while in a flat bed without using bedrails?: A Little Help needed moving from lying on your back to sitting on the side of a flat bed without using bedrails?: A Little Help needed moving to and from a bed to a chair (including a wheelchair)?: A Little Help needed standing up from a chair using your arms (e.g., wheelchair or bedside chair)?: A Little Help needed to walk in hospital room?: A Little Help needed climbing 3-5 steps with a railing? : A Little 6 Click Score: 18    End of Session Equipment Utilized During Treatment: Gait belt Activity Tolerance: Patient tolerated treatment well Patient left: in chair;with call bell/phone within reach;with family/visitor present Nurse Communication: Mobility status PT Visit Diagnosis: Unsteadiness on feet (R26.81);Muscle weakness (generalized) (M62.81);Difficulty in walking, not elsewhere classified (R26.2)     Time: 9041-8975 PT Time Calculation (min) (ACUTE ONLY): 26 min  Charges:    $Gait Training: 8-22 mins $Therapeutic Activity: 8-22 mins PT General Charges $$ ACUTE PT VISIT: 1 Visit                    Darice Bohr, PTA  [name] 03/27/2024, 11:30 AM

## 2024-03-27 NOTE — Care Management Obs Status (Signed)
 MEDICARE OBSERVATION STATUS NOTIFICATION   Patient Details  Name: JEMIA FATA MRN: [id number] Date of Birth: 04/01/1940   Medicare Observation Status Notification Given:  Chaney BRANDY CHRISTIANE LELON, CMA 03/27/2024, 4:49 PM

## 2024-03-27 NOTE — TOC Progression Note (Addendum)
 Transition of Care [facility]) - Progression Note    Patient Details  Name: Allison Hall MRN: [id number] Date of Birth: 1941-03-17  [facility] of [facility]) CM/SW Contact  Daved JONETTA Hamilton, RN Phone Number: 03/27/2024, 1:48 PM  Clinical Narrative:     This CM requested Nitchia with TOC to begin prior authorization for [employer].  UPDATE 1:59PM  This CM received the following message from Nitchia in [facility] I am unable to start auth for this pt because the portal says The current date falls outside of the patient's eligibility dates. You may only create an auth inside of these dates. I attempted to call [facility] and they are closed today.   Nitchia will re-attempt tomorrow when [facility] is open.  Expected Discharge Plan: Skilled Nursing Facility Barriers to Discharge: Continued Medical Work up               Expected Discharge Plan and Services     Post Acute Care Choice: Skilled Nursing Facility Living arrangements for the past 2 months: Single Family Home                                       Social Drivers of Health (SDOH) Interventions SDOH Screenings   Food Insecurity: No Food Insecurity (03/25/2024)  Housing: Low Risk (03/25/2024)  Transportation Needs: No Transportation Needs (03/25/2024)  Utilities: Not At Risk (03/25/2024)  Depression (PHQ2-9): Low Risk (02/08/2024)  Financial Resource Strain: Low Risk  (01/18/2024)   Received from [facility]  Social Connections: Moderately Integrated (03/25/2024)  Tobacco Use: Medium Risk (03/25/2024)    Readmission Risk Interventions     No data to display

## 2024-03-27 NOTE — Progress Notes (Signed)
 Patient was seen and examined in her rate next to her bed. Patient had told anesthesia yesterday that she felt like her foot was dragging. Today, patient stated that her strength was much better and could move her foot much more than yesterday. States that she felt her nerve was waking up. Denies any complaints. Discussed unlikely hood that the spinal would cause specific unilateral nerve damage but willing to revisit if the patient's symptoms did not continue to improve.

## 2024-03-27 NOTE — Plan of Care (Signed)
" °  Problem: Nutritional: Goal: Will attain and maintain optimal nutritional status Outcome: Progressing   Problem: Skin Integrity: Goal: Demonstrates signs of wound healing without infection Outcome: Progressing   Problem: Activity: Goal: Risk for activity intolerance will decrease Outcome: Progressing   Problem: Nutrition: Goal: Adequate nutrition will be maintained Outcome: Progressing   Problem: Pain Managment: Goal: General experience of comfort will improve and/or be controlled Outcome: Progressing   Problem: Safety: Goal: Ability to remain free from injury will improve Outcome: Progressing   "

## 2024-03-27 NOTE — Progress Notes (Signed)
 Physical Therapy Treatment Patient Details Name: Allison Hall MRN: [id number] DOB: [birth year] Today's Date: 03/27/2024   History of Present Illness 84 y/o female s/p R TKA on 03/25/24. PMH: HTN, depression, CKD stage 3a, hx of breast cancer, iron  deficiency anemia, anxiety    PT Comments  Pt received up in chair for pm session. Continues to progress functionally requiring CGA for transfers and gait training in hall. Definite reliance on RW to offload L LE, yet less antalgic this pm. Reviewed exercises and stretches in sitting. Educated pt on Right heel cord stretching and recs to f/u with PT post d/c to improve R foot Dorsiflexion and reduce Hip hike which has been an issue since April of this year. Overall good progression since surgery. Pt will benefit from STR prior to returning home.    If plan is discharge home, recommend the following: A little help with walking and/or transfers;A little help with bathing/dressing/bathroom;Assistance with cooking/housework;Assistance with feeding;Direct supervision/assist for medications management;Direct supervision/assist for financial management;Assist for transportation;Help with stairs or ramp for entrance   Can travel by private vehicle     Yes  Equipment Recommendations  Other (comment) (Defer to next level of care)    Recommendations for Other Services       Precautions / Restrictions Precautions Precautions: Fall Recall of Precautions/Restrictions: Intact Restrictions Weight Bearing Restrictions Per Provider Order: Yes RLE Weight Bearing Per Provider Order: Weight bearing as tolerated     Mobility  Bed Mobility               General bed mobility comments: Pt was in recliner pre/post session    Transfers Overall transfer level: Needs assistance Equipment used: Rolling walker (2 wheels) Transfers: Sit to/from Stand Sit to Stand: Contact guard assist           General transfer comment: CGA for safety with vcs  for improved technique    Ambulation/Gait Ambulation/Gait assistance: Contact guard assist Gait Distance (Feet): 160 Feet Assistive device: Rolling walker (2 wheels) Gait Pattern/deviations: Step-to pattern, Antalgic, Trunk flexed, Decreased dorsiflexion - right, Decreased dorsiflexion - left Gait velocity: decreased     General Gait Details:  (R foot now clearing floor during gait. L hip hike due to foot drop with decreased P/AROM since ~April of this year.)   Stairs             Wheelchair Mobility     Tilt Bed    Modified Rankin (Stroke Patients Only)       Balance Overall balance assessment: Needs assistance Sitting-balance support: No upper extremity supported, Feet supported Sitting balance-Leahy Scale: Good     Standing balance support: Bilateral upper extremity supported, During functional activity, Reliant on assistive device for balance Standing balance-Leahy Scale: Fair Standing balance comment: reliant on RW and CGA for safety                            Communication Communication Communication: No apparent difficulties  Cognition Arousal: Alert Behavior During Therapy: WFL for tasks assessed/performed   PT - Cognitive impairments: No apparent impairments                       PT - Cognition Comments: Pt is A and O x 4 Following commands: Intact      Cueing Cueing Techniques: Verbal cues  Exercises Total Joint Exercises Ankle Circles/Pumps: AROM, Both, 10 reps Quad Sets: AROM, Left, 10 reps Long Arc Quad:  AROM, Left, 10 reps, Seated Knee Flexion: AAROM, Seated Goniometric ROM: 0-108 Other Exercises Other Exercises:  (Pt encouraged to f/u with PT post d/c regarding R andkle weakness/ROM)    General Comments General comments (skin integrity, edema, etc.):  (Pt educated on Bilateral heelcord and hamstring stretches with belt, good teach back demonstration)      Pertinent Vitals/Pain Pain Assessment Pain Assessment:  Faces Faces Pain Scale: Hurts little more Pain Location: R knee Pain Descriptors / Indicators: Discomfort Pain Intervention(s): Monitored during session    Home Living                          Prior Function            PT Goals (current goals can now be found in the care plan section) Acute Rehab PT Goals Patient Stated Goal: to go to rehab before going home Progress towards PT goals: Progressing toward goals    Frequency    BID      PT Plan      Co-evaluation              AM-PAC PT 6 Clicks Mobility   Outcome Measure  Help needed turning from your back to your side while in a flat bed without using bedrails?: A Little Help needed moving from lying on your back to sitting on the side of a flat bed without using bedrails?: A Little Help needed moving to and from a bed to a chair (including a wheelchair)?: A Little Help needed standing up from a chair using your arms (e.g., wheelchair or bedside chair)?: A Little Help needed to walk in hospital room?: A Little Help needed climbing 3-5 steps with a railing? : A Little 6 Click Score: 18    End of Session Equipment Utilized During Treatment: Gait belt Activity Tolerance: Patient tolerated treatment well Patient left: in chair;with call bell/phone within reach Nurse Communication: Mobility status PT Visit Diagnosis: Unsteadiness on feet (R26.81);Muscle weakness (generalized) (M62.81);Difficulty in walking, not elsewhere classified (R26.2)     Time: 1400-1420 PT Time Calculation (min) (ACUTE ONLY): 20 min  Charges:    $Therapeutic Exercise: 8-22 mins PT General Charges $$ ACUTE PT VISIT: 1 Visit                    Darice Bohr, PTA  [name] 03/27/2024, 3:50 PM

## 2024-03-28 DIAGNOSIS — M1712 Unilateral primary osteoarthritis, left knee: Secondary | ICD-10-CM | POA: Diagnosis not present

## 2024-03-28 MED ORDER — LACTULOSE 10 GM/15ML PO SOLN
20.0000 g | Freq: Two times a day (BID) | ORAL | Status: DC
  Administered 2024-03-28: 20 g via ORAL
  Filled 2024-03-28: qty 30

## 2024-03-28 NOTE — TOC Progression Note (Signed)
 Transition of Care [facility]) - Progression Note    Patient Details  Name: Allison Hall MRN: [id number] Date of Birth: 1940-09-19  Transition of Care [facility]) CM/SW Contact  Alvaro Louder, [place] Phone Number: 03/28/2024, 12:55 PM  Clinical Narrative:   Per admin coordinator at [facility] patient will need a BM before they can admit to the facility.   TOC to follow for discharge    Expected Discharge Plan: Skilled Nursing Facility Barriers to Discharge: Continued Medical Work up               Expected Discharge Plan and Services     Post Acute Care Choice: Skilled Nursing Facility Living arrangements for the past 2 months: Single Family Home Expected Discharge Date: 03/28/24                                     Social Drivers of Health (SDOH) Interventions SDOH Screenings   Food Insecurity: No Food Insecurity (03/25/2024)  Housing: Low Risk (03/25/2024)  Transportation Needs: No Transportation Needs (03/25/2024)  Utilities: Not At Risk (03/25/2024)  Depression (PHQ2-9): Low Risk (02/08/2024)  Financial Resource Strain: Low Risk  (01/18/2024)   Received from [facility]  Social Connections: Moderately Integrated (03/25/2024)  Tobacco Use: Medium Risk (03/25/2024)    Readmission Risk Interventions     No data to display

## 2024-03-28 NOTE — Progress Notes (Signed)
 Physical Therapy Treatment Patient Details Name: Allison Hall MRN: [id number] DOB: [birth year] Today's Date: 03/28/2024   History of Present Illness 84 y/o female s/p R TKA on 03/25/24. PMH: HTN, depression, CKD stage 3a, hx of breast cancer, iron  deficiency anemia, anxiety    PT Comments  Pt was in recliner upon arrival. She continues to endorse needing to have a BM prior to Dcing but does not have the urge. Remains agreeable to PT session and motivated to improve. She was premedicated for pain prior and did tolerate increased activity versus AM session. RLE (non-operative) foot drop observed during ambulation. Discussed AFOs versus dorsi-assist  stirups. Pt elected to order off [employer] and will trial at [facility]. Overall, she demonstrated improved abilities and safety but remains rehab appropriate. Acute PT will continue per current POC.     If plan is discharge home, recommend the following: A little help with walking and/or transfers;A little help with bathing/dressing/bathroom;Assistance with cooking/housework;Assistance with feeding;Direct supervision/assist for medications management;Direct supervision/assist for financial management;Assist for transportation;Help with stairs or ramp for entrance     Equipment Recommendations  Other (comment) (Defer to next level of care.)       Precautions / Restrictions Precautions Precautions: Fall Recall of Precautions/Restrictions: Intact Restrictions Weight Bearing Restrictions Per Provider Order: Yes RLE Weight Bearing Per Provider Order: Weight bearing as tolerated     Mobility  Bed Mobility Overal bed mobility: Needs Assistance Bed Mobility: Supine to Sit, Sit to Supine  Supine to sit: Supervision Sit to supine: Supervision General bed mobility comments: Pt was in recliner pre/post session    Transfers Overall transfer level: Needs assistance Equipment used: Rolling walker (2 wheels) Transfers: Sit to/from Stand Sit to Stand:  Contact guard assist   Ambulation/Gait Ambulation/Gait assistance: Contact guard assist Gait Distance (Feet): 160 Feet Assistive device: Rolling walker (2 wheels) Gait Pattern/deviations: Step-through pattern, Antalgic Gait velocity: decreased  General Gait Details: Pt has foot drop on RLE (non-operative) and was encouraged to order an ankle dorsiflex assist brace. If this does not improve dropfoot, encouraged her to request RLE AFO.   Balance Overall balance assessment: Needs assistance Sitting-balance support: No upper extremity supported, Feet supported Sitting balance-Leahy Scale: Good     Standing balance support: Bilateral upper extremity supported, During functional activity, Reliant on assistive device for balance Standing balance-Leahy Scale: Fair Standing balance comment: reliant on RW       Communication Communication Communication: No apparent difficulties  Cognition Arousal: Alert Behavior During Therapy: WFL for tasks assessed/performed   PT - Cognitive impairments: No apparent impairments    PT - Cognition Comments: Pt A and O x 4 Following commands: Intact      Cueing Cueing Techniques: Verbal cues, Tactile cues  Exercises Total Joint Exercises Ankle Circles/Pumps: AROM, Both, 10 reps Quad Sets: AROM, Left, 10 reps Gluteal Sets: AROM, 10 reps Heel Slides: AROM, 10 reps Hip ABduction/ADduction: AROM, 10 reps Straight Leg Raises: AROM, 10 reps Long Arc Quad: AROM, 10 reps Goniometric ROM: 0-104    General Comments General comments (skin integrity, edema, etc.): Pt was given prune juice to help promote BM.      Pertinent Vitals/Pain Pain Assessment Pain Assessment: 0-10 Pain Score: 3  Pain Location: R knee Pain Descriptors / Indicators: Discomfort Pain Intervention(s): Limited activity within patient's tolerance, Monitored during session, Premedicated before session, Repositioned     PT Goals (current goals can now be found in the care plan  section) Acute Rehab PT Goals Patient Stated Goal: rehab then  home Progress towards PT goals: Progressing toward goals    Frequency    BID       AM-PAC PT 6 Clicks Mobility   Outcome Measure  Help needed turning from your back to your side while in a flat bed without using bedrails?: A Little Help needed moving from lying on your back to sitting on the side of a flat bed without using bedrails?: A Little Help needed moving to and from a bed to a chair (including a wheelchair)?: A Little Help needed standing up from a chair using your arms (e.g., wheelchair or bedside chair)?: A Little Help needed to walk in hospital room?: A Little Help needed climbing 3-5 steps with a railing? : A Little 6 Click Score: 18    End of Session   Activity Tolerance: Patient tolerated treatment well Patient left: in chair;with call bell/phone within reach (polar care in place) Nurse Communication: Mobility status PT Visit Diagnosis: Unsteadiness on feet (R26.81);Muscle weakness (generalized) (M62.81);Difficulty in walking, not elsewhere classified (R26.2)     Time: 8744-8685 PT Time Calculation (min) (ACUTE ONLY): 19 min  Charges:    $Therapeutic Exercise: 8-22 mins PT General Charges $$ ACUTE PT VISIT: 1 Visit                    Rankin Essex PTA 03/28/2024, 2:18 PM

## 2024-03-28 NOTE — Progress Notes (Signed)
 Subjective: 3 Days Post-Op Procedures (LRB): ARTHROPLASTY, KNEE, TOTAL (Left) Patient reports pain as mild.   Patient is well, and has had no acute complaints or problems Denies any CP, SOB, ABD pain. We will continue therapy today.  Plan is to go to SNF after hospital stay. Passing gas but no BM yet.  Objective: Vital signs in last 24 hours: Temp:  [97.9 F (36.6 C)-98.5 F (36.9 C)] 98.4 F (36.9 C) (01/02 0520) Pulse Rate:  [62-66] 64 (01/02 0520) Resp:  [15-17] 15 (01/02 0520) BP: (117-137)/(61-72) 137/72 (01/02 0520) SpO2:  [98 %] 98 % (01/02 0520)  Intake/Output from previous day: 01/01 0701 - 01/02 0700 In: 480 [P.O.:480] Out: -  Intake/Output this shift: No intake/output data recorded.  No results for input(s): HGB in the last 72 hours. No results for input(s): WBC, RBC, HCT, PLT in the last 72 hours. No results for input(s): NA, K, CL, CO2, BUN, CREATININE, GLUCOSE, CALCIUM  in the last 72 hours. No results for input(s): LABPT, INR in the last 72 hours.  EXAM General - Patient is Alert, Appropriate, and Oriented Extremity - Neurovascular intact Sensation intact distally Intact pulses distally Dorsiflexion/Plantar flexion intact Dressing - Moderate drainage to the left knee honeycomb dressing.  New dressing applied this AM. Motor Function - intact, moving foot and toes well on exam.  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  Past Medical History:  Diagnosis Date   Adrenal nodule    Anemia    Anxiety    Breast cancer of upper-outer quadrant of right female breast (HCC) 08/24/2016   Central stenosis of spinal canal    Chronic kidney disease, stage 3a (HCC)    Chronic pain of left knee    Colon polyp    COVID-19 02/14/2020   received treatment   Depression    GERD (gastroesophageal reflux disease)    Hypercholesteremia    Hypertension    Macular degeneration    Osteoarthritis    Osteoarthritis of left knee     Osteoporosis    Personal history of radiation therapy 2018   RIGHT BREAST CA UOQ   Primary osteoarthritis of left knee    Primary osteoarthritis of right shoulder    Scoliosis of lumbar spine    Skin cancer    Thyroid  nodule 04/17/2016    Assessment/Plan:   3 Days Post-Op Procedures (LRB): ARTHROPLASTY, KNEE, TOTAL (Left) Principal Problem:   Status post total knee replacement using cement, left  Estimated body mass index is 28.61 kg/m as calculated from the following:   Height as of this encounter: 5' 4 (1.626 m).   Weight as of this encounter: 75.6 kg. Advance diet Up with therapy Pain well controlled VSS CM to assist with dc to SNF.  Patient is medically stable and ready for discharge pending insurance authorization Continue to work on a BM.  Lactulose  added.  DVT Prophylaxis - TED hose and SCDs Eliquis Weight-Bearing as tolerated to left leg   J. Gustavo Level, PA-C [facility] 03/28/2024, 7:55 AM

## 2024-03-28 NOTE — Progress Notes (Signed)
 Physical Therapy Treatment Patient Details Name: Allison Hall MRN: [id number] DOB: December 28, 1940 Today's Date: 03/28/2024   History of Present Illness 84 y/o female s/p R TKA on 03/25/24. PMH: HTN, depression, CKD stage 3a, hx of breast cancer, iron  deficiency anemia, anxiety        If plan is discharge home, recommend the following: A little help with walking and/or transfers;A little help with bathing/dressing/bathroom;Assistance with cooking/housework;Assistance with feeding;Direct supervision/assist for medications management;Direct supervision/assist for financial management;Assist for transportation;Help with stairs or ramp for entrance     Equipment Recommendations  Other (comment) (Defer to next level of care)       Precautions / Restrictions Precautions Precautions: Fall Recall of Precautions/Restrictions: Intact Restrictions Weight Bearing Restrictions Per Provider Order: Yes RLE Weight Bearing Per Provider Order: Weight bearing as tolerated     Mobility  Bed Mobility  General bed mobility comments: Pt was in recliner pre/post session    Transfers Overall transfer level: Needs assistance Equipment used: Rolling walker (2 wheels) Transfers: Sit to/from Stand Sit to Stand: Contact guard assist   Ambulation/Gait Ambulation/Gait assistance: Contact guard assist, Supervision Gait Distance (Feet): 75 Feet Assistive device: Rolling walker (2 wheels) Gait Pattern/deviations: Step-through pattern, Antalgic Gait velocity: decreased  General Gait Details: less distance today 2/2 to increased pain. RN made aware of pt's request for pain medicine    Balance Overall balance assessment: Needs assistance Sitting-balance support: No upper extremity supported, Feet supported Sitting balance-Leahy Scale: Good     Standing balance support: Bilateral upper extremity supported, During functional activity, Reliant on assistive device for balance Standing balance-Leahy  Scale: Fair Standing balance comment: reliant on RW       Communication Communication Communication: No apparent difficulties  Cognition Arousal: Alert Behavior During Therapy: WFL for tasks assessed/performed   PT - Cognitive impairments: No apparent impairments    PT - Cognition Comments: Pt is A and O x 4 Following commands: Intact      Cueing Cueing Techniques: Verbal cues, Tactile cues  Exercises Total Joint Exercises Goniometric ROM: 0-104    General Comments General comments (skin integrity, edema, etc.): Pt was able to correctly perform stretching and hEP. Continued encouragement for dorsiflexion of LLE. pt has improved active dorsiflexion from two days prior.      Pertinent Vitals/Pain Pain Assessment Pain Assessment: 0-10 Pain Score: 4  Pain Location: R knee Pain Descriptors / Indicators: Discomfort Pain Intervention(s): Limited activity within patient's tolerance, Monitored during session, Premedicated before session, Repositioned     PT Goals (current goals can now be found in the care plan section) Acute Rehab PT Goals Patient Stated Goal: rehab then home Progress towards PT goals: Progressing toward goals    Frequency    BID       AM-PAC PT 6 Clicks Mobility   Outcome Measure  Help needed turning from your back to your side while in a flat bed without using bedrails?: A Little Help needed moving from lying on your back to sitting on the side of a flat bed without using bedrails?: A Little Help needed moving to and from a bed to a chair (including a wheelchair)?: A Little Help needed standing up from a chair using your arms (e.g., wheelchair or bedside chair)?: A Little Help needed to walk in hospital room?: A Little Help needed climbing 3-5 steps with a railing? : A Little 6 Click Score: 18    End of Session   Activity Tolerance: Patient tolerated treatment well;Patient limited by pain Patient  left: in chair;with call bell/phone within  reach Nurse Communication: Mobility status PT Visit Diagnosis: Unsteadiness on feet (R26.81);Muscle weakness (generalized) (M62.81);Difficulty in walking, not elsewhere classified (R26.2)     Time: 9249-9189 PT Time Calculation (min) (ACUTE ONLY): 20 min  Charges:    $Therapeutic Activity: 8-22 mins PT General Charges $$ ACUTE PT VISIT: 1 Visit                     Rankin Essex PTA 03/28/2024, 9:50 AM

## 2024-03-28 NOTE — Plan of Care (Signed)

## 2024-03-28 NOTE — Plan of Care (Signed)

## 2024-03-28 NOTE — TOC Transition Note (Signed)
 Transition of Care [facility]) - Discharge Note   Patient Details  Name: Allison Hall MRN: [id number] Date of Birth: 01-18-1941  Transition of Care [facility]) CM/SW Contact:  Alvaro Louder, LCSW Phone Number: 03/28/2024, 2:09 PM   Clinical Narrative:  Patient had BM.  LCSWA received insurance approval for patient to admit to SNF. LCSWA confirmed with MD that patient is stable for discharge. LCSWA notified the patient and she is in agreement with discharge . LCSWA confirmed bed is available at [facility] Transport arranged with [facility] for next available.    RM 712, Number to call report [phone].   TOC signing off  Final next level of care: Skilled Nursing Facility Barriers to Discharge: No Barriers Identified   Patient Goals and CMS Choice            Discharge Placement              Patient chooses bed at: [facility] [place] Patient to be transferred to facility by: [facility] Name of family member notified: Self Patient and family notified of of transfer: 03/28/24  Discharge Plan and Services Additional resources added to the After Visit Summary for       Post Acute Care Choice: Skilled Nursing Facility                               Social Drivers of Health (SDOH) Interventions SDOH Screenings   Food Insecurity: No Food Insecurity (03/25/2024)  Housing: Low Risk (03/25/2024)  Transportation Needs: No Transportation Needs (03/25/2024)  Utilities: Not At Risk (03/25/2024)  Depression (PHQ2-9): Low Risk (02/08/2024)  Financial Resource Strain: Low Risk  (01/18/2024)   Received from [facility]  Social Connections: Moderately Integrated (03/25/2024)  Tobacco Use: Medium Risk (03/25/2024)     Readmission Risk Interventions     No data to display

## 2024-03-28 NOTE — Progress Notes (Signed)
 Called report to [facility]. Nurse acknowledged understanding. Patient waiting for transportation via EMS.

## 2024-04-27 ENCOUNTER — Other Ambulatory Visit: Payer: Self-pay

## 2024-04-27 ENCOUNTER — Emergency Department

## 2024-04-27 ENCOUNTER — Observation Stay
Admission: EM | Admit: 2024-04-27 | Discharge: 2024-05-02 | Disposition: A | Source: Home / Self Care | Attending: Internal Medicine | Admitting: Internal Medicine

## 2024-04-27 DIAGNOSIS — T8141XA Infection following a procedure, superficial incisional surgical site, initial encounter: Principal | ICD-10-CM

## 2024-04-27 DIAGNOSIS — C50411 Malignant neoplasm of upper-outer quadrant of right female breast: Secondary | ICD-10-CM

## 2024-04-27 DIAGNOSIS — T8149XA Infection following a procedure, other surgical site, initial encounter: Secondary | ICD-10-CM | POA: Diagnosis not present

## 2024-04-27 DIAGNOSIS — I10 Essential (primary) hypertension: Secondary | ICD-10-CM | POA: Diagnosis present

## 2024-04-27 DIAGNOSIS — N179 Acute kidney failure, unspecified: Secondary | ICD-10-CM | POA: Insufficient documentation

## 2024-04-27 DIAGNOSIS — D649 Anemia, unspecified: Secondary | ICD-10-CM | POA: Insufficient documentation

## 2024-04-27 DIAGNOSIS — A4901 Methicillin susceptible Staphylococcus aureus infection, unspecified site: Secondary | ICD-10-CM

## 2024-04-27 DIAGNOSIS — Z96652 Presence of left artificial knee joint: Secondary | ICD-10-CM

## 2024-04-27 LAB — CBC
HCT: 30.1 % — ABNORMAL LOW (ref 36.0–46.0)
Hemoglobin: 9.6 g/dL — ABNORMAL LOW (ref 12.0–15.0)
MCH: 32 pg (ref 26.0–34.0)
MCHC: 31.9 g/dL (ref 30.0–36.0)
MCV: 100.3 fL — ABNORMAL HIGH (ref 80.0–100.0)
Platelets: 324 10*3/uL (ref 150–400)
RBC: 3 MIL/uL — ABNORMAL LOW (ref 3.87–5.11)
RDW: 14.2 % (ref 11.5–15.5)
WBC: 6.7 10*3/uL (ref 4.0–10.5)
nRBC: 0 % (ref 0.0–0.2)

## 2024-04-27 LAB — COMPREHENSIVE METABOLIC PANEL WITH GFR
ALT: 12 U/L (ref 0–44)
AST: 16 U/L (ref 15–41)
Albumin: 4.2 g/dL (ref 3.5–5.0)
Alkaline Phosphatase: 116 U/L (ref 38–126)
Anion gap: 13 (ref 5–15)
BUN: 29 mg/dL — ABNORMAL HIGH (ref 8–23)
CO2: 23 mmol/L (ref 22–32)
Calcium: 9.7 mg/dL (ref 8.9–10.3)
Chloride: 97 mmol/L — ABNORMAL LOW (ref 98–111)
Creatinine, Ser: 1.31 mg/dL — ABNORMAL HIGH (ref 0.44–1.00)
GFR, Estimated: 40 mL/min — ABNORMAL LOW
Glucose, Bld: 95 mg/dL (ref 70–99)
Potassium: 5.1 mmol/L (ref 3.5–5.1)
Sodium: 134 mmol/L — ABNORMAL LOW (ref 135–145)
Total Bilirubin: 0.3 mg/dL (ref 0.0–1.2)
Total Protein: 6.8 g/dL (ref 6.5–8.1)

## 2024-04-27 LAB — SEDIMENTATION RATE: Sed Rate: 71 mm/h — ABNORMAL HIGH (ref 0–30)

## 2024-04-27 LAB — C-REACTIVE PROTEIN: CRP: 7.4 mg/dL — ABNORMAL HIGH

## 2024-04-27 MED ORDER — VANCOMYCIN VARIABLE DOSE PER UNSTABLE RENAL FUNCTION (PHARMACIST DOSING): Status: DC

## 2024-04-27 MED ORDER — PIPERACILLIN-TAZOBACTAM 3.375 G IVPB 30 MIN
3.3750 g | Freq: Once | INTRAVENOUS | Status: AC
  Administered 2024-04-27: 3.375 g via INTRAVENOUS
  Filled 2024-04-27: qty 50

## 2024-04-27 MED ORDER — VANCOMYCIN HCL 1750 MG/350ML IV SOLN
1750.0000 mg | Freq: Once | INTRAVENOUS | Status: AC
  Administered 2024-04-27: 1750 mg via INTRAVENOUS
  Filled 2024-04-27: qty 350

## 2024-04-27 MED ORDER — HEPARIN SODIUM (PORCINE) 5000 UNIT/ML IJ SOLN
5000.0000 [IU] | Freq: Two times a day (BID) | INTRAMUSCULAR | Status: DC
  Administered 2024-04-28 (×2): 5000 [IU] via SUBCUTANEOUS
  Filled 2024-04-27: qty 1

## 2024-04-27 MED ORDER — MORPHINE SULFATE (PF) 2 MG/ML IV SOLN
2.0000 mg | INTRAVENOUS | Status: DC | PRN
  Filled 2024-04-27: qty 1

## 2024-04-27 MED ORDER — PIPERACILLIN-TAZOBACTAM 3.375 G IVPB
3.3750 g | Freq: Three times a day (TID) | INTRAVENOUS | Status: DC
  Administered 2024-04-28 – 2024-04-30 (×7): 3.375 g via INTRAVENOUS
  Filled 2024-04-27 (×6): qty 50

## 2024-04-27 MED ORDER — ACETAMINOPHEN 325 MG PO TABS
650.0000 mg | ORAL_TABLET | Freq: Four times a day (QID) | ORAL | Status: DC | PRN
  Administered 2024-04-28: 650 mg via ORAL
  Filled 2024-04-27: qty 2

## 2024-04-27 MED ORDER — HYDROCODONE-ACETAMINOPHEN 5-325 MG PO TABS: 1.0000 | ORAL_TABLET | ORAL | Status: DC | PRN

## 2024-04-27 MED ORDER — HYDRALAZINE HCL 20 MG/ML IJ SOLN: 5.0000 mg | INTRAMUSCULAR | Status: DC | PRN

## 2024-04-27 MED ORDER — ONDANSETRON HCL 4 MG/2ML IJ SOLN: 4.0000 mg | Freq: Four times a day (QID) | INTRAMUSCULAR | Status: DC | PRN

## 2024-04-27 MED ORDER — MELATONIN 5 MG PO TABS: 5.0000 mg | ORAL_TABLET | Freq: Every evening | ORAL | Status: DC | PRN

## 2024-04-27 MED ORDER — SODIUM CHLORIDE 0.9 % IV BOLUS
500.0000 mL | Freq: Once | INTRAVENOUS | Status: AC
  Administered 2024-04-28: 500 mL via INTRAVENOUS

## 2024-04-27 MED ORDER — ONDANSETRON HCL 4 MG PO TABS: 4.0000 mg | ORAL_TABLET | Freq: Four times a day (QID) | ORAL | Status: DC | PRN

## 2024-04-27 MED ORDER — ACETAMINOPHEN 650 MG RE SUPP: 650.0000 mg | Freq: Four times a day (QID) | RECTAL | Status: DC | PRN

## 2024-04-27 NOTE — Progress Notes (Signed)
 Called by ED Staff. Patient is s/p L TKA on 03/25/24 by Dr. Edie. Noted purulent drainage from inferior portion of incision with apparent fluid collection distally. Can express further purulence by pushing on this region.   No fevers/chills recently.   Pictures in chart reviewed. Discussed case with Dr. Edie.   Obtain L knee XR, ESR, CRP, CBC, BMP  Plan for OR tomorrow with Dr. Edie for I&D and possible poly exchange.  NPO after midnight Recommend admission to Hospitalist team. May start broad spectrum IV Abx.

## 2024-04-27 NOTE — Assessment & Plan Note (Addendum)
 S/p left total knee arthroplasty with cemented 03/25/2024 Zosyn  and vancomycin  Subcu heparin  to replace prophylactic apixaban  Pain control Orthopedics to perform washout on 04/28/2024

## 2024-04-27 NOTE — Assessment & Plan Note (Signed)
 Continue home carvedilol , lisinopril  and spironolactone 

## 2024-04-27 NOTE — Consult Note (Signed)
 Pharmacy Antibiotic Note  ASSESSMENT: 84 y.o. female with PMH including L TKR in December is presenting with wound infection. Noted redness and purulent drainage around surgical area. Pharmacy has been consulted to manage Zosyn  and vancomycin  dosing. She currently has unstable renal function because she is in AKI.  PLAN: Initiate Zosyn  3.375g IV q8H Administer vancomycin  1750mg  IV x 1 as a loading dose Monitor renal function and/or levels as clinically appropriate for additional vancomycin  dosing Monitor any culture results  Patient measurements: Height: 5' 4 (162.6 cm) Weight: 72.6 kg (160 lb) IBW/kg (Calculated) : 54.7  Vital signs: Temp: 98.1 F (36.7 C) (02/01 1628) Temp Source: Oral (02/01 1628) BP: 153/70 (02/01 1628) Pulse Rate: 81 (02/01 1628) Recent Labs  Lab 04/27/24 1745  WBC 6.7  CREATININE 1.31*   Estimated Creatinine Clearance: 30.7 mL/min (A) (by C-G formula based on SCr of 1.31 mg/dL (H)).  Allergies: Allergies[1]  Antimicrobials this admission: 2/1 Zosyn  >> 2/1 Vancomycin  >>  Dose adjustments this admission: n/a  Microbiology results: n/a  Thank you for allowing pharmacy to be a part of this patients care.  Will M. Lenon, PharmD, BCPS Clinical Pharmacist 04/27/2024 8:08 PM     [1]  Allergies Allergen Reactions   Sulfamethoxazole Other (See Comments)    Anxiety and insomnia   Other Itching    Vit C-vit E-copper-zinc-lutein - itching and fatigue (per [facility])   Statins Other (See Comments)    Muscle pain    Trazodone  Other (See Comments)    Pt states she felt unsteady    Ciprofloxacin Anxiety

## 2024-04-27 NOTE — Consult Note (Signed)
 ED Pharmacy Antibiotic Sign Off An antibiotic consult was received from an ED provider for vancomycin  per pharmacy dosing for cellulitis. A chart review was completed to assess appropriateness.   The following one time order(s) were placed:  Vancomycin  1750 mg IV x 1  Further antibiotic and/or antibiotic pharmacy consults should be ordered by the admitting provider if indicated.   Thank you for allowing pharmacy to be a part of this patient's care.   Kayla JULIANNA Blew, [facility]  Clinical Pharmacist 04/27/24 6:56 PM

## 2024-04-27 NOTE — Assessment & Plan Note (Signed)
 creatinine of 1.31 up from baseline of 0.78 a few months prior We will give a small bolus

## 2024-04-27 NOTE — ED Notes (Signed)
 First Set of [facility] sent to lab.

## 2024-04-27 NOTE — ED Provider Notes (Signed)
 "  [facility] Provider Note    Event Date/Time   First MD Initiated Contact with Patient 04/27/24 1809     (approximate)   History   Post-op Problem   HPI  Allison Hall is a 84 y.o. female with a history of a left TKR on December 30 with Dr. Edie.  Has been healing well, developed pain redness and purulent discharge last night from the bottom of the incision site     Physical Exam   Triage Vital Signs: ED Triage Vitals  Encounter Vitals Group     BP 04/27/24 1628 (!) 153/70     Girls Systolic BP Percentile --      Girls Diastolic BP Percentile --      Boys Systolic BP Percentile --      Boys Diastolic BP Percentile --      Pulse Rate 04/27/24 1628 81     Resp 04/27/24 1628 20     Temp 04/27/24 1628 98.1 F (36.7 C)     Temp Source 04/27/24 1628 Oral     SpO2 04/27/24 1628 98 %     Weight 04/27/24 1627 72.6 kg (160 lb)     Height 04/27/24 1627 1.626 m (5' 4)     Head Circumference --      Peak Flow --      Pain Score 04/27/24 1625 0     Pain Loc --      Pain Education --      Exclude from Growth Chart --     Most recent vital signs: Vitals:   04/27/24 1628  BP: (!) 153/70  Pulse: 81  Resp: 20  Temp: 98.1 F (36.7 C)  SpO2: 98%     General: Awake, no distress.  CV:  Good peripheral perfusion.  Resp:  Normal effort.  Abd:  No distention.  Other:     ED Results / Procedures / Treatments   Labs (all labs ordered are listed, but only abnormal results are displayed) Labs Reviewed  CBC - Abnormal; Notable for the following components:      Result Value   RBC 3.00 (*)    Hemoglobin 9.6 (*)    HCT 30.1 (*)    MCV 100.3 (*)    All other components within normal limits  COMPREHENSIVE METABOLIC PANEL WITH GFR - Abnormal; Notable for the following components:   Sodium 134 (*)    Chloride 97 (*)    BUN 29 (*)    Creatinine, Ser 1.31 (*)    GFR, Estimated 40 (*)    All other components within normal limits   AEROBIC/ANAEROBIC CULTURE W GRAM STAIN (SURGICAL/DEEP WOUND)  SEDIMENTATION RATE  C-REACTIVE PROTEIN  PROCALCITONIN     EKG     RADIOLOGY X-ray without acute abnormality    PROCEDURES:  Critical Care performed:   Procedures   MEDICATIONS ORDERED IN ED: Medications  vancomycin  (VANCOREADY) IVPB 1750 mg/350 mL (1,750 mg Intravenous New Bag/Given 04/27/24 1924)  piperacillin -tazobactam (ZOSYN ) IVPB 3.375 g (0 g Intravenous Stopped 04/27/24 1925)     IMPRESSION / MDM / ASSESSMENT AND PLAN / ED COURSE  I reviewed the triage vital signs and the nursing notes. Patient's presentation is most consistent with acute presentation with potential threat to life or bodily function.  Patient has purulent discharge from the bottom of the incision, suspect subcutaneous abscess just inferior to the incision site, able to express purulent discharge with pressure in that area.  Lab work is  overall reassuring, she is afebrile, not consistent with sepsis.  Discussed with Dr. Tobie of orthopedics, he recommends admission with IV antibiotics to medicine, likely washout tomorrow        FINAL CLINICAL IMPRESSION(S) / ED DIAGNOSES   Final diagnoses:  Infection of superficial incisional surgical site after procedure, initial encounter     Rx / DC Orders   ED Discharge Orders     None        Note:  This document was prepared using Dragon voice recognition software and may include unintentional dictation errors.   Arlander Charleston, MD 04/27/24 1927  "

## 2024-04-27 NOTE — Assessment & Plan Note (Signed)
 Hemoglobin at 9.6, slightly down from baseline of 11 Continue home vitamin B12 and ferrous sulfate

## 2024-04-27 NOTE — H&P (Incomplete)
 " History and Physical    Patient: Allison Hall FMW:[id number] DOB: 10/04/1940 DOA: 04/27/2024 DOS: the patient was seen and examined on 04/27/2024 PCP: Claudene Arthea Sharper, MD  Patient coming from: Home  Chief Complaint:  Chief Complaint  Patient presents with   Post-op Problem    HPI: Allison Hall is a 84 y.o. female with medical history significant for Breast cancer s/p XRT, hypertension and osteoarthritis, recent left total knee replacement 03/25/2024 on apixaban  prophylaxis being admitted with a surgical wound infection/possible abscess.  States she was doing well and then about a week ago she started having increased pain in the knee mostly at nights.  About 3 days ago she noted redness around the scar.  On the night of arrival the wound started weeping malodorous fluid around the distal end of the wound prompting the visit to the ED.  She denies fever or chills. In the ED, vitals within normal limits Labs notable for normal WBC and hemoglobin 9.6 down from baseline of 11.  Sed rate 71 CMP with mild AKI showing creatinine of 1.31 up from baseline of 0.78 a few months prior X-ray of the knee showed an intact left knee arthroplasty with small effusion and mild soft tissue swelling  The ED provider spoke with orthopedist, Dr. Earnestine Blanch who recommended admission for antibiotics.  Plans for washout on 04/28/24  Admission requested to medical service     Past Medical History:  Diagnosis Date   Adrenal nodule    Anemia    Anxiety    Breast cancer of upper-outer quadrant of right female breast (HCC) 08/24/2016   Central stenosis of spinal canal    Chronic kidney disease, stage 3a (HCC)    Chronic pain of left knee    Colon polyp    COVID-19 02/14/2020   received treatment   Depression    GERD (gastroesophageal reflux disease)    Hypercholesteremia    Hypertension    Macular degeneration    Osteoarthritis    Osteoarthritis of left knee    Osteoporosis     Personal history of radiation therapy 2018   RIGHT BREAST CA UOQ   Primary osteoarthritis of left knee    Primary osteoarthritis of right shoulder    Scoliosis of lumbar spine    Skin cancer    Thyroid  nodule 04/17/2016   Past Surgical History:  Procedure Laterality Date   ABDOMINAL HYSTERECTOMY     APPENDECTOMY     BICEPT TENODESIS Right 09/11/2023   Procedure: TENODESIS, BICEPS;  Surgeon: Edie Norleen PARAS, MD;  Location: [facility];  Service: Orthopedics;  Laterality: Right;   BREAST BIOPSY Right 08/24/2016   9:30 - invasive mammary carcinoma with lobular features   BREAST BIOPSY Right 08/24/2016   10:00 - invasive mammary carcinoma with lobular features   BREAST LUMPECTOMY Right 10/09/2016   INVASIVE CARCINOMA, TUBULO-LOBULAR VARIANT, CLEAR MARGINS, NEGATIVE LN'S   CATARACT EXTRACTION W/PHACO Left 04/06/2020   Procedure: CATARACT EXTRACTION PHACO AND INTRAOCULAR LENS PLACEMENT (IOC) LEFT;  Surgeon: Jaye Fallow, MD;  Location: [facility];  Service: Ophthalmology;  Laterality: Left;  5.49 0:33.9   CATARACT EXTRACTION W/PHACO Right 04/20/2020   Procedure: CATARACT EXTRACTION PHACO AND INTRAOCULAR LENS PLACEMENT (IOC) RIGHT;  Surgeon: Jaye Fallow, MD;  Location: [facility];  Service: Ophthalmology;  Laterality: Right;  7.26 0:47.2   COLONOSCOPY W/ POLYPECTOMY  2003, 2008, 2014   COLONOSCOPY WITH PROPOFOL  N/A 12/03/2017   Procedure: COLONOSCOPY WITH PROPOFOL ;  Surgeon: Viktoria Lamar DASEN,  MD;  Location: [facility] END[facility];  Service: Endoscopy;  Laterality: N/A;   ESOPHAGOGASTRODUODENOSCOPY     foreign body   ESOPHAGOGASTRODUODENOSCOPY (EGD) WITH PROPOFOL  N/A 12/03/2017   Procedure: ESOPHAGOGASTRODUODENOSCOPY (EGD) WITH PROPOFOL ;  Surgeon: Viktoria Lamar DASEN, MD;  Location: [facility] END[facility];  Service: Endoscopy;  Laterality: N/A;   JOINT REPLACEMENT Right 2008   TKR   NOSE SURGERY     PARTIAL MASTECTOMY WITH NEEDLE LOCALIZATION Right 09/29/2016   Procedure: PARTIAL  MASTECTOMY WITH NEEDLE LOCALIZATION;  Surgeon: Claudene Larinda Bolder, MD;  Location: [facility] OR[facility];  Service: General;  Laterality: Right;   REPLACEMENT TOTAL KNEE Right 2008   REVERSE SHOULDER ARTHROPLASTY Right 09/11/2023   Procedure: ARTHROPLASTY, SHOULDER, TOTAL, REVERSE;  Surgeon: Edie Norleen PARAS, MD;  Location: [facility];  Service: Orthopedics;  Laterality: Right;   SENTINEL NODE BIOPSY Right 09/29/2016   Procedure: SENTINEL NODE BIOPSY;  Surgeon: Claudene Larinda Bolder, MD;  Location: [facility];  Service: General;  Laterality: Right;   TOTAL KNEE ARTHROPLASTY Left 03/25/2024   Procedure: ARTHROPLASTY, KNEE, TOTAL;  Surgeon: Edie Norleen PARAS, MD;  Location: [facility];  Service: Orthopedics;  Laterality: Left;   Social History:  reports that she quit smoking about 58 years ago. Her smoking use included cigarettes. She started smoking about 68 years ago. She has a 10 pack-year smoking history. She has never used smokeless tobacco. She reports current alcohol use of about 7.0 standard drinks of alcohol per week. She reports that she does not use drugs.  Allergies[1]  Family History  Problem Relation Age of Onset   Hypertension Mother    Depression Mother    Heart failure Father    Obesity Brother    Heart disease Brother    Stroke Brother    Depression Brother    Breast cancer Cousin    Breast cancer Cousin    Breast cancer Cousin     Prior to Admission medications  Medication Sig Start Date End Date Taking? Authorizing Provider  acetaminophen  (TYLENOL ) 650 MG CR tablet Take 1,300 mg by mouth every 8 (eight) hours as needed for pain.   Yes [provider]  aspirin  325 MG tablet Take 325 mg by mouth daily.   Yes [provider]  BIOTIN PO Take 1 each by mouth daily. Chew 1 gummy every morning.   Yes [provider]  carvedilol  (COREG ) 12.5 MG tablet Take 12.5 mg by mouth 2 (two) times daily with a meal. 11/07/22  Yes [provider]  celecoxib  (CELEBREX ) 200 MG  capsule Take 200 mg by mouth 2 (two) times daily. 02/26/24  Yes [provider]  Cholecalciferol  (VITAMIN D3) 1000 units CAPS Take 1,000 Units by mouth daily.   Yes [provider]  cyanocobalamin  (VITAMIN B12) 1000 MCG tablet Take 1,000 mcg by mouth daily.   Yes [provider]  lisinopril  (ZESTRIL ) 40 MG tablet Take 40 mg by mouth daily. 11/24/18  Yes [provider]  loperamide  (IMODIUM ) 2 MG capsule Take 4 mg by mouth as needed for diarrhea or loose stools.   Yes [provider]  Magnesium  Gluconate 27.5 MG TABS Take 27.5 mg by mouth at bedtime.   Yes [provider]  Multiple Vitamins-Minerals (HAIR SKIN AND NAILS FORMULA PO) Take 2 tablets by mouth daily.   Yes [provider]  Omega-3 Fatty Acids (FISH OIL) 1200 MG CAPS Take 1,200 mg by mouth daily.   Yes [provider]  omeprazole (PRILOSEC) 40 MG capsule Take 40 mg by mouth  daily. 08/04/20  Yes [provider]  spironolactone  (ALDACTONE ) 25 MG tablet Take 25 mg by mouth daily. 01/16/23  Yes [provider]  traMADol  (ULTRAM ) 50 MG tablet Take 1 tablet (50 mg total) by mouth every 6 (six) hours as needed for moderate pain (pain score 4-6) (Breakthrough pain). 03/26/24  Yes Charlene Debby BROCKS, PA-C  apixaban  (ELIQUIS ) 2.5 MG TABS tablet Take 1 tablet (2.5 mg total) by mouth 2 (two) times daily. 03/26/24 04/09/24  Charlene Debby BROCKS, PA-C  docusate sodium  (COLACE) 100 MG capsule Take 1 capsule (100 mg total) by mouth 2 (two) times daily. Patient not taking: Reported on 04/27/2024 03/26/24   Charlene Debby BROCKS, PA-C  ferrous sulfate 324 MG TBEC Take 324 mg by mouth daily with breakfast. Patient not taking: Reported on 04/27/2024    [provider]  Multiple Vitamins-Minerals (EYE VITAMINS) CAPS Take 1 capsule by mouth daily. Vision MD Patient not taking: Reported on 04/27/2024    [provider]  ondansetron  (ZOFRAN ) 4 MG tablet Take 1 tablet (4 mg  total) by mouth every 6 (six) hours as needed for nausea. Patient not taking: Reported on 04/27/2024 03/26/24   Charlene Debby BROCKS, PA-C  oxyCODONE  (OXY IR/ROXICODONE ) 5 MG immediate release tablet Take 1-2 tablets (5-10 mg total) by mouth every 4 (four) hours as needed for moderate pain (pain score 4-6). Patient not taking: Reported on 04/27/2024 03/26/24   Charlene Debby BROCKS DEVONNA    Physical Exam: Vitals:   04/27/24 1627 04/27/24 1628 04/27/24 2040  BP:  (!) 153/70 (!) 173/90  Pulse:  81 83  Resp:  20   Temp:  98.1 F (36.7 C)   TempSrc:  Oral   SpO2:  98% 100%  Weight: 72.6 kg    Height: 5' 4 (1.626 m)     Physical Exam Vitals and nursing note reviewed.  Constitutional:      General: She is not in acute distress. HENT:     Head: Normocephalic and atraumatic.  Cardiovascular:     Rate and Rhythm: Normal rate and regular rhythm.     Heart sounds: Normal heart sounds.  Pulmonary:     Effort: Pulmonary effort is normal.     Breath sounds: Normal breath sounds.  Abdominal:     Palpations: Abdomen is soft.     Tenderness: There is no abdominal tenderness.  Musculoskeletal:     Comments: See pic below  Neurological:     Mental Status: Mental status is at baseline.          Labs on Admission: I have personally reviewed following labs and imaging studies  CBC: Recent Labs  Lab 04/27/24 1745  WBC 6.7  HGB 9.6*  HCT 30.1*  MCV 100.3*  PLT 324   Basic Metabolic Panel: Recent Labs  Lab 04/27/24 1745  NA 134*  K 5.1  CL 97*  CO2 23  GLUCOSE 95  BUN 29*  CREATININE 1.31*  CALCIUM  9.7   GFR: Estimated Creatinine Clearance: 30.7 mL/min (A) (by C-G formula based on SCr of 1.31 mg/dL (H)). Liver Function Tests: Recent Labs  Lab 04/27/24 1745  AST 16  ALT 12  ALKPHOS 116  BILITOT 0.3  PROT 6.8  ALBUMIN 4.2   No results for input(s): LIPASE, AMYLASE in the last 168 hours. No results for input(s): AMMONIA in the last 168 hours. Coagulation  Profile: No results for input(s): INR, PROTIME in the last 168 hours. Cardiac Enzymes: No results for input(s): CKTOTAL, CKMB, CKMBINDEX,  TROPONINI in the last 168 hours. BNP (last 3 results) No results for input(s): PROBNP in the last 8760 hours. HbA1C: No results for input(s): HGBA1C in the last 72 hours. CBG: No results for input(s): GLUCAP in the last 168 hours. Lipid Profile: No results for input(s): CHOL, HDL, LDLCALC, TRIG, CHOLHDL, LDLDIRECT in the last 72 hours. Thyroid  Function Tests: No results for input(s): TSH, T4TOTAL, FREET4, T3FREE, THYROIDAB in the last 72 hours. Anemia Panel: No results for input(s): VITAMINB12, FOLATE, FERRITIN, TIBC, IRON , RETICCTPCT in the last 72 hours. Urine analysis:    Component Value Date/Time   COLORURINE YELLOW (A) 03/11/2024 0935   APPEARANCEUR CLEAR (A) 03/11/2024 0935   APPEARANCEUR Clear 08/22/2013 1529   LABSPEC 1.010 03/11/2024 0935   LABSPEC 1.003 08/22/2013 1529   PHURINE 7.0 03/11/2024 0935   GLUCOSEU NEGATIVE 03/11/2024 0935   GLUCOSEU Negative 08/22/2013 1529   HGBUR NEGATIVE 03/11/2024 0935   BILIRUBINUR NEGATIVE 03/11/2024 0935   BILIRUBINUR Negative 08/22/2013 1529   KETONESUR NEGATIVE 03/11/2024 0935   PROTEINUR NEGATIVE 03/11/2024 0935   NITRITE NEGATIVE 03/11/2024 0935   LEUKOCYTESUR NEGATIVE 03/11/2024 0935   LEUKOCYTESUR Negative 08/22/2013 1529    Radiological Exams on Admission: DG Knee Complete 4 Views Left Result Date: 04/27/2024 EXAM: 4 VIEW(S) XRAY OF THE LEFT KNEE 04/27/2024 06:35:00 PM COMPARISON: 03/25/2024 CLINICAL HISTORY: Infection. FINDINGS: BONES AND JOINTS: Left knee arthroplasty in place. No evidence of loosening. No acute fracture. No malalignment. Small effusion. SOFT TISSUES: Mild soft tissue swelling. VASCULATURE: Femoral popliteal atherosclerosis. IMPRESSION: 1. Intact left knee arthroplasty with small effusion and mild soft tissue swelling.  Electronically signed by: Norman Gatlin MD 04/27/2024 06:44 PM EST RP Workstation: [id number]   Data Reviewed for HPI: Relevant notes from primary care and specialist visits, past discharge summaries as available in EHR, including Care Everywhere. Prior diagnostic testing as pertinent to current admission diagnoses Updated medications and problem lists for reconciliation ED course, including vitals, labs, imaging, treatment and response to treatment Triage notes, nursing and pharmacy notes and ED provider's notes Notable results as noted above in HPI      Assessment and Plan: * Surgical wound infection S/p left total knee arthroplasty with cemented 03/25/2024 Zosyn  and vancomycin  Subcu heparin  to replace prophylactic apixaban  Pain control N.p.o. tonight Orthopedics to perform washout on 04/28/2024  AKI (acute kidney injury) creatinine of 1.31 up from baseline of 0.78 a few months prior We will give a small bolus  Anemia Hemoglobin at 9.6, slightly down from baseline of 11 Continue home vitamin B12 and ferrous sulfate  Carcinoma of upper-outer quadrant of right breast in female, estrogen receptor positive (HCC) S/p treatment No acute issues suspected  Hypertension Continue home carvedilol , lisinopril  and spironolactone     DVT prophylaxis: Heparin   Consults: Ortho  Advance Care Planning:   Code Status: Full Code   Family Communication: none  Disposition Plan: Back to previous home environment  Severity of Illness: The appropriate patient status for this patient is OBSERVATION. Observation status is judged to be reasonable and necessary in order to provide the required intensity of service to ensure the patient's safety. The patient's presenting symptoms, physical exam findings, and initial radiographic and laboratory data in the context of their medical condition is felt to place them at decreased risk for further clinical deterioration. Furthermore, it is anticipated  that the patient will be medically stable for discharge from the hospital within 2 midnights of admission.   Author: Delayne LULLA Solian, MD 04/27/2024 11:10 PM  For on  call review [email or web address].      [1]  Allergies Allergen Reactions   Sulfamethoxazole Other (See Comments)    Anxiety and insomnia   Amlodipine Swelling    Legs swelling   Other Itching    Vit C-vit E-copper-zinc-lutein - itching and fatigue (per [facility])   Statins Other (See Comments)    Muscle pain    Trazodone  Other (See Comments)    Pt states she felt unsteady    Ciprofloxacin Anxiety   "

## 2024-04-27 NOTE — ED Triage Notes (Signed)
 To ED [facility] from home for post op problem. Pt had L knee replacement 12/30. Yesterday noticed redness around surgical area and purulent drainage. Has erythema and swelling to area below incision and to lower incision with the lower part open with thin drainage that appears purulent. No pain.

## 2024-04-27 NOTE — Assessment & Plan Note (Signed)
 S/p treatment No acute issues suspected

## 2024-04-28 ENCOUNTER — Encounter: Payer: Self-pay | Admitting: Internal Medicine

## 2024-04-28 ENCOUNTER — Encounter: Admission: EM | Disposition: A | Payer: Self-pay | Source: Home / Self Care | Attending: Internal Medicine

## 2024-04-28 ENCOUNTER — Observation Stay: Admitting: Certified Registered"

## 2024-04-28 DIAGNOSIS — T8149XA Infection following a procedure, other surgical site, initial encounter: Secondary | ICD-10-CM | POA: Diagnosis not present

## 2024-04-28 LAB — CBC
HCT: 25.8 % — ABNORMAL LOW (ref 36.0–46.0)
Hemoglobin: 8.6 g/dL — ABNORMAL LOW (ref 12.0–15.0)
MCH: 32.2 pg (ref 26.0–34.0)
MCHC: 33.3 g/dL (ref 30.0–36.0)
MCV: 96.6 fL (ref 80.0–100.0)
Platelets: 255 10*3/uL (ref 150–400)
RBC: 2.67 MIL/uL — ABNORMAL LOW (ref 3.87–5.11)
RDW: 13.8 % (ref 11.5–15.5)
WBC: 6.8 10*3/uL (ref 4.0–10.5)
nRBC: 0 % (ref 0.0–0.2)

## 2024-04-28 LAB — BASIC METABOLIC PANEL WITH GFR
Anion gap: 12 (ref 5–15)
BUN: 24 mg/dL — ABNORMAL HIGH (ref 8–23)
CO2: 22 mmol/L (ref 22–32)
Calcium: 9.5 mg/dL (ref 8.9–10.3)
Chloride: 102 mmol/L (ref 98–111)
Creatinine, Ser: 1.12 mg/dL — ABNORMAL HIGH (ref 0.44–1.00)
GFR, Estimated: 48 mL/min — ABNORMAL LOW
Glucose, Bld: 85 mg/dL (ref 70–99)
Potassium: 4.4 mmol/L (ref 3.5–5.1)
Sodium: 136 mmol/L (ref 135–145)

## 2024-04-28 LAB — SYNOVIAL CELL COUNT + DIFF, W/ CRYSTALS
Crystals, Fluid: NONE SEEN
Eosinophils-Synovial: 0 %
Lymphocytes-Synovial Fld: 29 %
Monocyte-Macrophage-Synovial Fluid: 52 %
Neutrophil, Synovial: 15 %
Other Cells-SYN: 4
WBC, Synovial: 114 /mm3 (ref 0–200)

## 2024-04-28 LAB — PATHOLOGIST SMEAR REVIEW

## 2024-04-28 LAB — PROCALCITONIN: Procalcitonin: 0.26 ng/mL

## 2024-04-28 LAB — VANCOMYCIN, RANDOM: Vancomycin Rm: 10 ug/mL

## 2024-04-28 MED ORDER — ONDANSETRON HCL 4 MG/2ML IJ SOLN: 4.0000 mg | Freq: Four times a day (QID) | INTRAMUSCULAR | Status: DC | PRN

## 2024-04-28 MED ORDER — METOCLOPRAMIDE HCL 5 MG PO TABS: 5.0000 mg | ORAL_TABLET | Freq: Three times a day (TID) | ORAL | Status: DC | PRN

## 2024-04-28 MED ORDER — ALUM & MAG HYDROXIDE-SIMETH 200-200-20 MG/5ML PO SUSP
30.0000 mL | ORAL | Status: DC | PRN
  Administered 2024-04-28 – 2024-05-02 (×2): 30 mL via ORAL
  Filled 2024-04-28 (×3): qty 30

## 2024-04-28 MED ORDER — PANTOPRAZOLE SODIUM 40 MG PO TBEC
40.0000 mg | DELAYED_RELEASE_TABLET | Freq: Every day | ORAL | Status: DC
  Administered 2024-04-28 – 2024-05-02 (×4): 40 mg via ORAL
  Filled 2024-04-28 (×4): qty 1

## 2024-04-28 MED ORDER — BISACODYL 10 MG RE SUPP: 10.0000 mg | Freq: Every day | RECTAL | Status: DC | PRN

## 2024-04-28 MED ORDER — ONDANSETRON HCL 4 MG/2ML IJ SOLN: 4.0000 mg | Freq: Once | INTRAMUSCULAR | Status: DC | PRN

## 2024-04-28 MED ORDER — LOPERAMIDE HCL 2 MG PO CAPS
4.0000 mg | ORAL_CAPSULE | ORAL | Status: DC | PRN
  Administered 2024-04-30 – 2024-05-01 (×3): 4 mg via ORAL
  Filled 2024-04-28 (×3): qty 2

## 2024-04-28 MED ORDER — VANCOMYCIN HCL 1500 MG/300ML IV SOLN
1500.0000 mg | INTRAVENOUS | Status: DC
  Filled 2024-04-28: qty 300

## 2024-04-28 MED ORDER — ONDANSETRON HCL 4 MG/2ML IJ SOLN
INTRAMUSCULAR | Status: DC | PRN
  Administered 2024-04-28: 4 mg via INTRAVENOUS

## 2024-04-28 MED ORDER — FENTANYL CITRATE (PF) 100 MCG/2ML IJ SOLN
INTRAMUSCULAR | Status: AC
  Filled 2024-04-28: qty 2

## 2024-04-28 MED ORDER — OXYCODONE HCL 5 MG PO TABS: 5.0000 mg | ORAL_TABLET | Freq: Once | ORAL | Status: DC | PRN

## 2024-04-28 MED ORDER — BUPIVACAINE LIPOSOME 1.3 % IJ SUSP
INTRAMUSCULAR | Status: DC | PRN
  Administered 2024-04-28: 10 mL

## 2024-04-28 MED ORDER — DIPHENHYDRAMINE HCL 12.5 MG/5ML PO ELIX: 12.5000 mg | ORAL_SOLUTION | ORAL | Status: DC | PRN

## 2024-04-28 MED ORDER — TRAMADOL HCL 50 MG PO TABS
50.0000 mg | ORAL_TABLET | Freq: Four times a day (QID) | ORAL | Status: DC
  Administered 2024-04-28 – 2024-05-02 (×10): 50 mg via ORAL
  Filled 2024-04-28 (×12): qty 1

## 2024-04-28 MED ORDER — METOCLOPRAMIDE HCL 5 MG/ML IJ SOLN: 5.0000 mg | Freq: Three times a day (TID) | INTRAMUSCULAR | Status: DC | PRN

## 2024-04-28 MED ORDER — SENNA 8.6 MG PO TABS
1.0000 | ORAL_TABLET | Freq: Every day | ORAL | Status: DC
  Administered 2024-04-28 – 2024-04-30 (×3): 8.6 mg via ORAL
  Filled 2024-04-28 (×3): qty 1

## 2024-04-28 MED ORDER — MAGNESIUM HYDROXIDE 400 MG/5ML PO SUSP: 30.0000 mL | Freq: Every day | ORAL | Status: DC | PRN

## 2024-04-28 MED ORDER — PIPERACILLIN-TAZOBACTAM 3.375 G IVPB
INTRAVENOUS | Status: AC
  Filled 2024-04-28: qty 50

## 2024-04-28 MED ORDER — SODIUM CHLORIDE 0.9 % IV SOLN: INTRAVENOUS | Status: AC

## 2024-04-28 MED ORDER — 0.9 % SODIUM CHLORIDE (POUR BTL) OPTIME
TOPICAL | Status: DC | PRN
  Administered 2024-04-28: 500 mL

## 2024-04-28 MED ORDER — PROPOFOL 10 MG/ML IV BOLUS
INTRAVENOUS | Status: DC | PRN
  Administered 2024-04-28 (×2): 40 mg via INTRAVENOUS

## 2024-04-28 MED ORDER — LACTATED RINGERS IV SOLN: INTRAVENOUS | Status: DC

## 2024-04-28 MED ORDER — SODIUM CHLORIDE 0.9 % IR SOLN
Status: DC | PRN
  Administered 2024-04-28: 3000 mL

## 2024-04-28 MED ORDER — PHENYLEPHRINE HCL-NACL 20-0.9 MG/250ML-% IV SOLN
INTRAVENOUS | Status: AC
  Filled 2024-04-28: qty 250

## 2024-04-28 MED ORDER — PROPOFOL 500 MG/50ML IV EMUL
INTRAVENOUS | Status: DC | PRN
  Administered 2024-04-28: 80 ug/kg/min via INTRAVENOUS

## 2024-04-28 MED ORDER — APIXABAN 2.5 MG PO TABS
2.5000 mg | ORAL_TABLET | Freq: Two times a day (BID) | ORAL | Status: DC
  Administered 2024-04-29 (×2): 2.5 mg via ORAL
  Filled 2024-04-28 (×2): qty 1

## 2024-04-28 MED ORDER — PROPOFOL 10 MG/ML IV BOLUS
INTRAVENOUS | Status: AC
  Filled 2024-04-28: qty 40

## 2024-04-28 MED ORDER — BUPIVACAINE-EPINEPHRINE 0.5% -1:200000 IJ SOLN
INTRAMUSCULAR | Status: DC | PRN
  Administered 2024-04-28: 10 mL

## 2024-04-28 MED ORDER — SPIRONOLACTONE 25 MG PO TABS
25.0000 mg | ORAL_TABLET | Freq: Every day | ORAL | Status: DC
  Administered 2024-04-28 – 2024-05-02 (×4): 25 mg via ORAL
  Filled 2024-04-28 (×4): qty 1

## 2024-04-28 MED ORDER — FENTANYL CITRATE (PF) 100 MCG/2ML IJ SOLN
INTRAMUSCULAR | Status: DC | PRN
  Administered 2024-04-28: 25 ug via INTRAVENOUS
  Administered 2024-04-28: 50 ug via INTRAVENOUS
  Administered 2024-04-28: 25 ug via INTRAVENOUS
  Administered 2024-04-28 (×3): 50 ug via INTRAVENOUS
  Administered 2024-04-28 (×2): 25 ug via INTRAVENOUS

## 2024-04-28 MED ORDER — BUPIVACAINE LIPOSOME 1.3 % IJ SUSP
INTRAMUSCULAR | Status: AC
  Filled 2024-04-28: qty 10

## 2024-04-28 MED ORDER — OXYCODONE HCL 5 MG/5ML PO SOLN: 5.0000 mg | Freq: Once | ORAL | Status: DC | PRN

## 2024-04-28 MED ORDER — VANCOMYCIN HCL 750 MG/150ML IV SOLN
750.0000 mg | INTRAVENOUS | Status: DC
  Administered 2024-04-28 – 2024-04-29 (×2): 750 mg via INTRAVENOUS
  Filled 2024-04-28 (×2): qty 150

## 2024-04-28 MED ORDER — LISINOPRIL 20 MG PO TABS
40.0000 mg | ORAL_TABLET | Freq: Every day | ORAL | Status: DC
  Administered 2024-04-28 – 2024-05-02 (×4): 40 mg via ORAL
  Filled 2024-04-28 (×4): qty 2

## 2024-04-28 MED ORDER — FENTANYL CITRATE (PF) 100 MCG/2ML IJ SOLN
25.0000 ug | INTRAMUSCULAR | Status: DC | PRN
  Administered 2024-04-28: 25 ug via INTRAVENOUS

## 2024-04-28 MED ORDER — CELECOXIB 200 MG PO CAPS
200.0000 mg | ORAL_CAPSULE | Freq: Two times a day (BID) | ORAL | Status: DC
  Administered 2024-04-28 – 2024-05-02 (×7): 200 mg via ORAL
  Filled 2024-04-28 (×7): qty 1

## 2024-04-28 MED ORDER — ACETAMINOPHEN 500 MG PO TABS
500.0000 mg | ORAL_TABLET | Freq: Four times a day (QID) | ORAL | Status: AC
  Administered 2024-04-28 – 2024-04-29 (×3): 500 mg via ORAL
  Filled 2024-04-28 (×3): qty 1

## 2024-04-28 MED ORDER — VITAMIN D 25 MCG (1000 UNIT) PO TABS
1000.0000 [IU] | ORAL_TABLET | Freq: Every day | ORAL | Status: DC
  Administered 2024-04-28 – 2024-05-02 (×4): 1000 [IU] via ORAL
  Filled 2024-04-28 (×4): qty 1

## 2024-04-28 MED ORDER — ONDANSETRON HCL 4 MG PO TABS: 4.0000 mg | ORAL_TABLET | Freq: Four times a day (QID) | ORAL | Status: DC | PRN

## 2024-04-28 MED ORDER — ACETAMINOPHEN 325 MG PO TABS
325.0000 mg | ORAL_TABLET | Freq: Four times a day (QID) | ORAL | Status: DC | PRN
  Administered 2024-04-30: 650 mg via ORAL
  Filled 2024-04-28: qty 2

## 2024-04-28 MED ORDER — PROPOFOL 1000 MG/100ML IV EMUL
INTRAVENOUS | Status: AC
  Filled 2024-04-28: qty 100

## 2024-04-28 MED ORDER — CARVEDILOL 12.5 MG PO TABS
12.5000 mg | ORAL_TABLET | Freq: Two times a day (BID) | ORAL | Status: DC
  Administered 2024-04-28 – 2024-05-02 (×7): 12.5 mg via ORAL
  Filled 2024-04-28 (×7): qty 1

## 2024-04-28 MED ORDER — FLEET ENEMA RE ENEM: 1.0000 | ENEMA | Freq: Once | RECTAL | Status: DC | PRN

## 2024-04-28 NOTE — Anesthesia Postprocedure Evaluation (Signed)
"   Anesthesia Post Note  Patient: Allison Hall  Procedure(s) Performed: IRRIGATION AND DEBRIDEMENT KNEE WITH POLY EXCHANGE (Left: Knee)  Patient location during evaluation: PACU Anesthesia Type: General Level of consciousness: awake and alert Pain management: pain level controlled Vital Signs Assessment: post-procedure vital signs reviewed and stable Respiratory status: spontaneous breathing Cardiovascular status: stable Anesthetic complications: no   No notable events documented.   Last Vitals:  Vitals:   04/28/24 1024 04/28/24 1300  BP: (!) 182/85 (!) 150/73  Pulse: 90 75  Resp: 18 13  Temp: (!) 36.2 C (!) 35.9 C  SpO2: 98% 98%    Last Pain:  Vitals:   04/28/24 1300  TempSrc:   PainSc: 5                  VAN STAVEREN,Shaylah Mcghie      "

## 2024-04-28 NOTE — Op Note (Signed)
 04/28/2024  12:41 PM  Patient:   Allison Hall  Pre-Op Diagnosis:   Superficial wound infection with possible intra-articular extension status post left TKA.  Post-Op Diagnosis:   Superficial wound infection status post left TKA.  Procedure:   Irrigation and debridement of superficial wound infection left knee.  Surgeon:   DOROTHA Reyes Maltos, MD  Assistant:   Gustavo Level, PA-C  Anesthesia:   IV sedation  Findings:   As above.  Complications:   None  Fluids:   200 cc crystalloid  EBL:   5 cc  UOP:   None  TT:   30 minutes at 300 mmHg  Drains:   Wound VAC x 1  Closure:   None  Brief Clinical Note:   The patient is an 84 year old female who is now 1 month status post a left total knee arthroplasty.  The patient presented to the emergency room yesterday with a several day history of worsening pain, erythema, and focal swelling along the inferior aspect of her surgical incision near the tibial tubercle.  The patient denies any injury to the area, and denies any fevers or chills.  Given her recent surgery, it was felt most prudent to admit the patient for IV antibiotics and proceed with a formal irrigation and debridement of the infected wound.  Procedure:   The patient was brought into the operating room and laid in the supine position.  After adequate IV sedation was achieved, the left lower extremity was prepped with ChloraPrep solution before being draped sterilely.  Preoperative antibiotics were continued.  While holding the leg was held in an elevated position to exsanguinate it, a timeout was performed to verify the appropriate surgical site before the tourniquet was inflated to 300 mmHg.  The inferior-most 3 to 4 cm of the surgical incision was reopened and this incision was carried an additional 2 cm distally.  Purulent material was immediately identified and a culture obtained.  Further dissection exposed areas of thickened purulent material.  Samples of material tissue  also were collected and sent for culture and sensitivity.  The subcutaneous tissues were roughly abraded with a curette and lightly abraded with a rongeur to help clean off any obviously contaminated tissues.    Aggressive pressure was applied from the superior, medial, lateral, and inferior regions to try to express any more purulent material from the subcutaneous tissues in the proximal tibial region.  Once this action no longer produced any purulent material, the knee was placed through a range of motion to see if any other purulent material was able to be expressed into the wound, but fluid or purulent material could be expressed into the wound.  Finally, the knee joint itself was aspirated of a total of 10 cc of clear straw-colored fluid which was slightly blood-tinged, but showed no obvious evidence of purulence.  This fluid also was sent for a cell count differential, as well as for a Gram stain and culture and sensitivity.  The wound was irrigated thoroughly with 3 L of sterile saline solution using the jet lavage system before the wound was packed with a wound VAC sponge.  The proper sealing dressings were applied around the wound VAC before it was connected to the pump to verify that the appropriate seal had been obtained.  The patient was then awakened and returned to the recovery room in satisfactory condition after tolerating the procedure well.

## 2024-04-29 ENCOUNTER — Encounter: Payer: Self-pay | Admitting: Surgery

## 2024-04-29 LAB — CBC
HCT: 25.4 % — ABNORMAL LOW (ref 36.0–46.0)
Hemoglobin: 8.1 g/dL — ABNORMAL LOW (ref 12.0–15.0)
MCH: 31.9 pg (ref 26.0–34.0)
MCHC: 31.9 g/dL (ref 30.0–36.0)
MCV: 100 fL (ref 80.0–100.0)
Platelets: 259 10*3/uL (ref 150–400)
RBC: 2.54 MIL/uL — ABNORMAL LOW (ref 3.87–5.11)
RDW: 14.1 % (ref 11.5–15.5)
WBC: 5.2 10*3/uL (ref 4.0–10.5)
nRBC: 0 % (ref 0.0–0.2)

## 2024-04-29 LAB — BASIC METABOLIC PANEL WITH GFR
Anion gap: 9 (ref 5–15)
BUN: 19 mg/dL (ref 8–23)
CO2: 25 mmol/L (ref 22–32)
Calcium: 9.1 mg/dL (ref 8.9–10.3)
Chloride: 103 mmol/L (ref 98–111)
Creatinine, Ser: 1.1 mg/dL — ABNORMAL HIGH (ref 0.44–1.00)
GFR, Estimated: 49 mL/min — ABNORMAL LOW
Glucose, Bld: 74 mg/dL (ref 70–99)
Potassium: 4.2 mmol/L (ref 3.5–5.1)
Sodium: 137 mmol/L (ref 135–145)

## 2024-04-29 NOTE — Progress Notes (Signed)
 Mobility Specialist - Progress Note   04/29/24 1410  Mobility  Activity Pivoted/transferred from bed to chair  Level of Assistance Standby assist, set-up cues, supervision of patient - no hands on  Assistive Device None  Distance Ambulated (ft) 4 ft  LLE Weight Bearing Per Provider Order WBAT  Activity Response Tolerated well  Mobility visit 1 Mobility  Mobility Specialist Start Time (ACUTE ONLY) 1400  Mobility Specialist Stop Time (ACUTE ONLY) 1409  Mobility Specialist Time Calculation (min) (ACUTE ONLY) 9 min   America Silvan Mobility Specialist 04/29/24 2:11 PM

## 2024-04-29 NOTE — TOC CM/SW Note (Signed)
 Transition of Care [facility]) CM/SW Note   Transition of Care [facility]) - Inpatient Brief Assessment   Patient Details  Name: Allison Hall MRN: [id number] Date of Birth: [birth year]  Transition of Care [facility]) CM/SW Contact:    Alvaro Louder, LCSW Phone Number: 04/29/2024, 9:29 AM   Clinical Narrative:  Per chart review TOC consulted for Home health. LCSWA to follow up with patient about HH.   TOC to follow for discharge   Transition of Care Asessment: Insurance and Status: Insurance coverage has been reviewed Patient has primary care physician: Yes Home environment has been reviewed: single family home Prior level of function:: Market Researcher Home Services: No current home services Social Drivers of Health Review: SDOH reviewed no interventions necessary Readmission risk has been reviewed: Yes Transition of care needs: no transition of care needs at this time

## 2024-04-30 DIAGNOSIS — A4901 Methicillin susceptible Staphylococcus aureus infection, unspecified site: Secondary | ICD-10-CM

## 2024-04-30 DIAGNOSIS — T8141XA Infection following a procedure, superficial incisional surgical site, initial encounter: Secondary | ICD-10-CM

## 2024-04-30 LAB — CBC
HCT: 25.9 % — ABNORMAL LOW (ref 36.0–46.0)
Hemoglobin: 8.3 g/dL — ABNORMAL LOW (ref 12.0–15.0)
MCH: 31.7 pg (ref 26.0–34.0)
MCHC: 32 g/dL (ref 30.0–36.0)
MCV: 98.9 fL (ref 80.0–100.0)
Platelets: 268 10*3/uL (ref 150–400)
RBC: 2.62 MIL/uL — ABNORMAL LOW (ref 3.87–5.11)
RDW: 14 % (ref 11.5–15.5)
WBC: 4.9 10*3/uL (ref 4.0–10.5)
nRBC: 0 % (ref 0.0–0.2)

## 2024-04-30 LAB — AEROBIC/ANAEROBIC CULTURE W GRAM STAIN (SURGICAL/DEEP WOUND): Gram Stain: NONE SEEN

## 2024-04-30 LAB — CREATININE, SERUM
Creatinine, Ser: 1.14 mg/dL — ABNORMAL HIGH (ref 0.44–1.00)
GFR, Estimated: 47 mL/min — ABNORMAL LOW

## 2024-04-30 MED ORDER — CEFAZOLIN SODIUM-DEXTROSE 2-4 GM/100ML-% IV SOLN
2.0000 g | Freq: Two times a day (BID) | INTRAVENOUS | Status: DC
  Administered 2024-04-30 – 2024-05-02 (×5): 2 g via INTRAVENOUS
  Filled 2024-04-30 (×5): qty 100

## 2024-04-30 NOTE — Progress Notes (Signed)
" ° ° °  PROCEDURAL EXPEDITER PROGRESS NOTE  Patient Name: Allison Hall  DOB:1940/10/26 Date of Admission: 04/27/2024  Date of Assessment:04/30/24   -------------------------------------------------------------------------------------------------------------------   Brief clinical summary: pt is 84 yr old female having surgery on 05/01/24 for I & D of left knee  Orders in place:  Yes   Communication with surgical team if no orders: n/a  Labs, test, and orders reviewed: yes  Requires surgical clearance:  No       -------------------------------------------------------------------------------------------------------------------  [facility] Patient Care Command Expediter, Ronal DELENA Bald Please contact us  directly via secure chat (search for [facility]) or by calling us  at [phone] [facility] [place]).  "

## 2024-04-30 NOTE — Progress Notes (Signed)
 Mobility Specialist - Progress Note  Post-mobility: HR-91, SPO2-97%   04/30/24 1515  Mobility  Activity Ambulated with assistance  Level of Assistance Standby assist, set-up cues, supervision of patient - no hands on  Assistive Device Front wheel walker  Distance Ambulated (ft) 425 ft  Range of Motion/Exercises All extremities  LLE Weight Bearing Per Provider Order WBAT  Activity Response Tolerated well  Mobility visit 1 Mobility  Mobility Specialist Start Time (ACUTE ONLY) 1449  Mobility Specialist Stop Time (ACUTE ONLY) 1506  Mobility Specialist Time Calculation (min) (ACUTE ONLY) 17 min   Pt was in the recliner with legs up on RA upon entry. Pt agreed to mobility. Pt is able today to STS with 2 WW independently. Pt ambulated well throughout activity. Pt vitals were WNL. After activity pt returned to the room with guest repositioned in the recliner with needs in reach.  Clem Rodes Mobility Specialist 04/30/24, 3:26 PM

## 2024-04-30 NOTE — Plan of Care (Signed)
" °  Problem: Education: Goal: Knowledge of General Education information will improve Description: Including pain rating scale, medication(s)/side effects and non-pharmacologic comfort measures Outcome: Progressing   Problem: Activity: Goal: Risk for activity intolerance will decrease Outcome: Progressing   Problem: Nutrition: Goal: Adequate nutrition will be maintained Outcome: Progressing   Problem: Pain Managment: Goal: General experience of comfort will improve and/or be controlled Outcome: Progressing   Problem: Safety: Goal: Ability to remain free from injury will improve Outcome: Progressing   Problem: Skin Integrity: Goal: Demonstrates signs of wound healing without infection Outcome: Progressing   "

## 2024-04-30 NOTE — Progress Notes (Signed)
 Physical Therapy Treatment Patient Details Name: Allison Hall MRN: [id number] DOB: 07-02-1940 Today's Date: 04/30/2024   History of Present Illness 84 y/o female presented to ED on 04/27/24 for redness around surgical area with purulent drainage. 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 Found to have wound infection. S/p I&D superficial wound infection L knee. PMH: HTN, depression, CKD stage 3a, hx of breast cancer, iron  deficiency anemia, anxiety    PT Comments  Pt safe moving short distances in room, educated on asking nursing for assist to bathroom due to wound vac. Overall, pt without c/o L knee pain during gait and AROM. Minimal if any edema noted. Pt demonstrated safe gait training in hall with light support on RW, good bilateral heel strike. Pt continues to make great progress back to baseline. Potential L knee wash out again tomorrow.    If plan is discharge home, recommend the following: A little help with bathing/dressing/bathroom;Assistance with cooking/housework;Assist for transportation;Help with stairs or ramp for entrance   Can travel by private vehicle        Equipment Recommendations  None recommended by PT    Recommendations for Other Services       Precautions / Restrictions Precautions Precautions: Fall Recall of Precautions/Restrictions: Intact Precaution/Restrictions Comments:  (L knee wound vac intact) Restrictions Weight Bearing Restrictions Per Provider Order: Yes LLE Weight Bearing Per Provider Order: Weight bearing as tolerated     Mobility  Bed Mobility Overal bed mobility: Modified Independent                  Transfers Overall transfer level: Needs assistance Equipment used: Rolling walker (2 wheels), None Transfers: Sit to/from Stand Sit to Stand: Supervision           General transfer comment:  (Able to stand from various surfaces without any external assist)    Ambulation/Gait Ambulation/Gait assistance: Supervision Gait Distance  (Feet):  (550) Assistive device: Rolling walker (2 wheels) Gait Pattern/deviations: Step-through pattern, Decreased stride length, Knee flexed in stance - left Gait velocity: decreased     General Gait Details: supervision for safety and line management. Good heel strike throughout ambulation   Stairs             Wheelchair Mobility     Tilt Bed    Modified Rankin (Stroke Patients Only)       Balance                                            Communication Communication Communication: No apparent difficulties  Cognition Arousal: Alert Behavior During Therapy: WFL for tasks assessed/performed   PT - Cognitive impairments: No apparent impairments                         Following commands: Intact      Cueing Cueing Techniques: Verbal cues  Exercises Other Exercises Other Exercises:  (Pt with good recall of TKA strengthening exercises)    General Comments General comments (skin integrity, edema, etc.):  (Wound vac in place, awaiting L knee wash out again on 2/5)      Pertinent Vitals/Pain Pain Assessment Pain Assessment: No/denies pain    Home Living                          Prior Function  PT Goals (current goals can now be found in the care plan section) Acute Rehab PT Goals Patient Stated Goal: to go home after procedures are done Progress towards PT goals: Progressing toward goals    Frequency    Min 3X/week      PT Plan      Co-evaluation              AM-PAC PT 6 Clicks Mobility   Outcome Measure  Help needed turning from your back to your side while in a flat bed without using bedrails?: None Help needed moving from lying on your back to sitting on the side of a flat bed without using bedrails?: None Help needed moving to and from a bed to a chair (including a wheelchair)?: A Little Help needed standing up from a chair using your arms (e.g., wheelchair or bedside chair)?: A  Little Help needed to walk in hospital room?: A Little Help needed climbing 3-5 steps with a railing? : A Little 6 Click Score: 20    End of Session Equipment Utilized During Treatment:  (wound vac) Activity Tolerance: Patient tolerated treatment well Patient left: in chair;with call bell/phone within reach Nurse Communication: Mobility status PT Visit Diagnosis: Unsteadiness on feet (R26.81);Muscle weakness (generalized) (M62.81);Difficulty in walking, not elsewhere classified (R26.2)     Time: 1131-1150 PT Time Calculation (min) (ACUTE ONLY): 19 min  Charges:    $Therapeutic Activity: 8-22 mins PT General Charges $$ ACUTE PT VISIT: 1 Visit                    Darice Bohr, PTA  [name] 04/30/2024, 3:16 PM

## 2024-04-30 NOTE — Consult Note (Signed)
 NAME: Allison Hall  DOB: December 07, 1940  MRN: [id number]  Date/Time: 04/30/2024 10:23 AM  REQUESTING PROVIDER: Dr.Patel Subjective:  REASON FOR CONSULT: left knee surgical site infection ? Allison Hall is a 84 y.o. with a history of HTN, HLD, RT Breast ca, rt tka, r reverse shoulder arthroplasty, Left TKA on 03/25/24 and had follow up with otho twice after and the first noted to have some increasedbloody discharge but no purulence and second time during staples removal on 1/14 was doing well presented to the ED on 2/1 with redness at the surgical site with malodorous discharge  04/27/24  BP 150/67 !  Temp 98.1 F (36.7 C)  Pulse Rate 83  Resp 20  SpO2 95 %     Latest Reference Range & Units 04/27/24  WBC 4.0 - 10.5 K/uL 6.7  Hemoglobin 12.0 - 15.0 g/dL 9.6 (L)  HCT 63.9 - 53.9 % 30.1 (L)  Platelets 150 - 400 K/uL 324  Creatinine 0.44 - 1.00 mg/dL 8.68 (H)   Blood culture sent Started on vanco and cefepime On 2/2 she underwent debridement of the surgical site and cultures sent- The joint was aspirated and synovial fluid was 114 ( 15% N). Culture synovial fluid neg but 3 wound culture staph aureus I am asked to see patient for the same Pt is doing better Swelling and pain better    Past Medical History:  Diagnosis Date   Adrenal nodule    Anemia    Anxiety    Breast cancer of upper-outer quadrant of right female breast (HCC) 08/24/2016   Central stenosis of spinal canal    Chronic kidney disease, stage 3a (HCC)    Chronic pain of left knee    Colon polyp    COVID-19 02/14/2020   received treatment   Depression    GERD (gastroesophageal reflux disease)    Hypercholesteremia    Hypertension    Macular degeneration    Osteoarthritis    Osteoarthritis of left knee    Osteoporosis    Personal history of radiation therapy 2018   RIGHT BREAST CA UOQ   Primary osteoarthritis of left knee    Primary osteoarthritis of right shoulder    Scoliosis of lumbar  spine    Skin cancer    Thyroid  nodule 04/17/2016    Past Surgical History:  Procedure Laterality Date   ABDOMINAL HYSTERECTOMY     APPENDECTOMY     BICEPT TENODESIS Right 09/11/2023   Procedure: TENODESIS, BICEPS;  Surgeon: Edie Norleen PARAS, MD;  Location: [facility] ORS;  Service: Orthopedics;  Laterality: Right;   BREAST BIOPSY Right 08/24/2016   9:30 - invasive mammary carcinoma with lobular features   BREAST BIOPSY Right 08/24/2016   10:00 - invasive mammary carcinoma with lobular features   BREAST LUMPECTOMY Right 10/09/2016   INVASIVE CARCINOMA, TUBULO-LOBULAR VARIANT, CLEAR MARGINS, NEGATIVE LN'S   CATARACT EXTRACTION W/PHACO Left 04/06/2020   Procedure: CATARACT EXTRACTION PHACO AND INTRAOCULAR LENS PLACEMENT (IOC) LEFT;  Surgeon: Jaye Fallow, MD;  Location: [facility];  Service: Ophthalmology;  Laterality: Left;  5.49 0:33.9   CATARACT EXTRACTION W/PHACO Right 04/20/2020   Procedure: CATARACT EXTRACTION PHACO AND INTRAOCULAR LENS PLACEMENT (IOC) RIGHT;  Surgeon: Jaye Fallow, MD;  Location: [facility];  Service: Ophthalmology;  Laterality: Right;  7.26 0:47.2   COLONOSCOPY W/ POLYPECTOMY  2003, 2008, 2014   COLONOSCOPY WITH PROPOFOL  N/A 12/03/2017   Procedure: COLONOSCOPY WITH PROPOFOL ;  Surgeon: Viktoria Lamar DASEN, MD;  Location: [facility] ENDOSCOPY;  Service:  Endoscopy;  Laterality: N/A;   ESOPHAGOGASTRODUODENOSCOPY     foreign body   ESOPHAGOGASTRODUODENOSCOPY (EGD) WITH PROPOFOL  N/A 12/03/2017   Procedure: ESOPHAGOGASTRODUODENOSCOPY (EGD) WITH PROPOFOL ;  Surgeon: Viktoria Lamar DASEN, MD;  Location: [facility] ENDOSCOPY;  Service: Endoscopy;  Laterality: N/A;   I & D KNEE WITH POLY EXCHANGE Left 04/28/2024   Procedure: IRRIGATION AND DEBRIDEMENT KNEE WITH POLY EXCHANGE;  Surgeon: Edie Norleen PARAS, MD;  Location: [facility] OR[facility];  Service: Orthopedics;  Laterality: Left;  wound vac placed   JOINT REPLACEMENT Right 2008   TKR   NOSE SURGERY     PARTIAL MASTECTOMY WITH NEEDLE  LOCALIZATION Right 09/29/2016   Procedure: PARTIAL MASTECTOMY WITH NEEDLE LOCALIZATION;  Surgeon: Claudene Larinda Bolder, MD;  Location: [facility];  Service: General;  Laterality: Right;   REPLACEMENT TOTAL KNEE Right 2008   REVERSE SHOULDER ARTHROPLASTY Right 09/11/2023   Procedure: ARTHROPLASTY, SHOULDER, TOTAL, REVERSE;  Surgeon: Edie Norleen PARAS, MD;  Location: [facility] OR[facility];  Service: Orthopedics;  Laterality: Right;   SENTINEL NODE BIOPSY Right 09/29/2016   Procedure: SENTINEL NODE BIOPSY;  Surgeon: Claudene Larinda Bolder, MD;  Location: [facility];  Service: General;  Laterality: Right;   TOTAL KNEE ARTHROPLASTY Left 03/25/2024   Procedure: ARTHROPLASTY, KNEE, TOTAL;  Surgeon: Edie Norleen PARAS, MD;  Location: [facility];  Service: Orthopedics;  Laterality: Left;    Social History   Socioeconomic History   Marital status: Widowed    Spouse name: Not on file   Number of children: Not on file   Years of education: Not on file   Highest education level: Not on file  Occupational History   Not on file  Tobacco Use   Smoking status: Former    Current packs/day: 0.00    Average packs/day: 1 pack/day for 10.0 years (10.0 ttl pk-yrs)    Types: Cigarettes    Start date: 05/19/1955    Quit date: 05/18/1965    Years since quitting: 58.9   Smokeless tobacco: Never  Vaping Use   Vaping status: Never Used  Substance and Sexual Activity   Alcohol use: Yes    Alcohol/week: 7.0 standard drinks of alcohol    Types: 7 Glasses of wine per week    Comment: 1 glass wine daily   Drug use: No   Sexual activity: Not Currently  Other Topics Concern   Not on file  Social History Narrative   Daughter lives with her   Social Drivers of Health   Tobacco Use: Medium Risk (04/28/2024)   Patient History    Smoking Tobacco Use: Former    Smokeless Tobacco Use: Never    Passive Exposure: Not on file  Financial Resource Strain: Low Risk  (04/09/2024)   Received from [facility]   Overall Financial Resource  Strain (CARDIA)    Difficulty of Paying Living Expenses: Not hard at all  Food Insecurity: No Food Insecurity (04/28/2024)   Epic    Worried About Running Out of Food in the Last Year: Never true    Ran Out of Food in the Last Year: Never true  Transportation Needs: No Transportation Needs (04/28/2024)   Epic    Lack of Transportation (Medical): No    Lack of Transportation (Non-Medical): No  Physical Activity: Not on file  Stress: Not on file  Social Connections: Moderately Integrated (04/28/2024)   Social Connection and Isolation Panel    Frequency of Communication with Friends and Family: Three times a week    Frequency of Social Gatherings with  Friends and Family: Three times a week    Attends Religious Services: More than 4 times per year    Active Member of Clubs or Organizations: Yes    Attends Banker Meetings: More than 4 times per year    Marital Status: Widowed  Intimate Partner Violence: Not At Risk (04/28/2024)   Epic    Fear of Current or Ex-Partner: No    Emotionally Abused: No    Physically Abused: No    Sexually Abused: No  Depression (PHQ2-9): Low Risk (02/08/2024)   Depression (PHQ2-9)    PHQ-2 Score: 0  Alcohol Screen: Not on file  Housing: Low Risk (04/28/2024)   Epic    Unable to Pay for Housing in the Last Year: No    Number of Times Moved in the Last Year: 0    Homeless in the Last Year: No  Utilities: Not At Risk (04/28/2024)   Epic    Threatened with loss of utilities: No  Health Literacy: Not on file    Family History  Problem Relation Age of Onset   Hypertension Mother    Depression Mother    Heart failure Father    Obesity Brother    Heart disease Brother    Stroke Brother    Depression Brother    Breast cancer Cousin    Breast cancer Cousin    Breast cancer Cousin    Allergies[1] I? Current Facility-Administered Medications  Medication Dose Route Frequency Provider Last Rate Last Admin   acetaminophen  (TYLENOL ) tablet 325-650 mg   325-650 mg Oral Q6H PRN Poggi, John J, MD       alum & mag hydroxide-simeth (MAALOX/MYLANTA) 200-200-20 MG/5ML suspension 30 mL  30 mL Oral Q4H PRN Jens Durand, MD   30 mL at 04/28/24 1814   bisacodyl  (DULCOLAX) suppository 10 mg  10 mg Rectal Daily PRN Poggi, John J, MD       carvedilol  (COREG ) tablet 12.5 mg  12.5 mg Oral BID WC Poggi, John J, MD   12.5 mg at 04/30/24 9070   celecoxib  (CELEBREX ) capsule 200 mg  200 mg Oral BID Poggi, John J, MD   200 mg at 04/30/24 9070   cholecalciferol  (VITAMIN D3) 25 MCG (1000 UNIT) tablet 1,000 Units  1,000 Units Oral Daily Poggi, Norleen PARAS, MD   1,000 Units at 04/30/24 9070   diphenhydrAMINE  (BENADRYL ) 12.5 MG/5ML elixir 12.5-25 mg  12.5-25 mg Oral Q4H PRN Poggi, John J, MD       hydrALAZINE  (APRESOLINE ) injection 5 mg  5 mg Intravenous Q4H PRN Poggi, John J, MD       HYDROcodone -acetaminophen  (NORCO/VICODIN) 5-325 MG per tablet 1-2 tablet  1-2 tablet Oral Q4H PRN Poggi, Norleen PARAS, MD       lisinopril  (ZESTRIL ) tablet 40 mg  40 mg Oral Daily Poggi, Norleen PARAS, MD   40 mg at 04/30/24 9070   loperamide  (IMODIUM ) capsule 4 mg  4 mg Oral PRN Poggi, John J, MD       magnesium  hydroxide (MILK OF MAGNESIA) suspension 30 mL  30 mL Oral Daily PRN Poggi, John J, MD       melatonin tablet 5 mg  5 mg Oral QHS PRN Poggi, John J, MD       metoCLOPramide  (REGLAN ) tablet 5-10 mg  5-10 mg Oral Q8H PRN Poggi, John J, MD       Or   metoCLOPramide  (REGLAN ) injection 5-10 mg  5-10 mg Intravenous Q8H PRN Poggi, John J, MD  morphine  (PF) 2 MG/ML injection 2 mg  2 mg Intravenous Q2H PRN Poggi, Norleen PARAS, MD       ondansetron  (ZOFRAN ) tablet 4 mg  4 mg Oral Q6H PRN Poggi, Norleen PARAS, MD       Or   ondansetron  (ZOFRAN ) injection 4 mg  4 mg Intravenous Q6H PRN Poggi, Norleen PARAS, MD       pantoprazole  (PROTONIX ) EC tablet 40 mg  40 mg Oral Daily Poggi, Norleen PARAS, MD   40 mg at 04/30/24 9068   piperacillin -tazobactam (ZOSYN ) IVPB 3.375 g  3.375 g Intravenous Q8H Poggi, Norleen PARAS, MD 12.5 mL/hr at  04/30/24 0522 3.375 g at 04/30/24 0522   senna (SENOKOT) tablet 8.6 mg  1 tablet Oral Daily Poggi, Norleen PARAS, MD   8.6 mg at 04/30/24 9070   sodium phosphate  (FLEET) enema 1 enema  1 enema Rectal Once PRN Poggi, Norleen PARAS, MD       spironolactone  (ALDACTONE ) tablet 25 mg  25 mg Oral Daily Poggi, Norleen PARAS, MD   25 mg at 04/30/24 9070   traMADol  (ULTRAM ) tablet 50 mg  50 mg Oral Q6H Poggi, Norleen PARAS, MD   50 mg at 04/30/24 0522   vancomycin  (VANCOREADY) IVPB 750 mg/150 mL  750 mg Intravenous Q24H Nazari, Walid A, RPH 150 mL/hr at 04/29/24 2227 750 mg at 04/29/24 2227     Abtx:  Anti-infectives (From admission, onward)    Start     Dose/Rate Route Frequency Ordered Stop   04/28/24 2200  vancomycin  (VANCOREADY) IVPB 1500 mg/300 mL  Status:  Discontinued        1,500 mg 150 mL/hr over 120 Minutes Intravenous Every 48 hours 04/28/24 1902 04/28/24 2024   04/28/24 2200  vancomycin  (VANCOREADY) IVPB 750 mg/150 mL        750 mg 150 mL/hr over 60 Minutes Intravenous Every 24 hours 04/28/24 2024     04/28/24 0400  piperacillin -tazobactam (ZOSYN ) IVPB 3.375 g        3.375 g 12.5 mL/hr over 240 Minutes Intravenous Every 8 hours 04/27/24 2006     04/27/24 2007  vancomycin  variable dose per unstable renal function (pharmacist dosing)  Status:  Discontinued         Does not apply See admin instructions 04/27/24 2007 04/28/24 2024   04/27/24 1900  piperacillin -tazobactam (ZOSYN ) IVPB 3.375 g        3.375 g 100 mL/hr over 30 Minutes Intravenous  Once 04/27/24 1853 04/27/24 1925   04/27/24 1900  vancomycin  (VANCOREADY) IVPB 1750 mg/350 mL        1,750 mg 175 mL/hr over 120 Minutes Intravenous  Once 04/27/24 1856 04/27/24 2124       REVIEW OF SYSTEMS:  Const: negative fever, negative chills, negative weight loss Eyes: negative diplopia or visual changes, negative eye pain ENT: negative coryza, negative sore throat Resp: negative cough, hemoptysis, dyspnea Cards: negative for chest pain, palpitations, lower  extremity edema GU: negative for frequency, dysuria and hematuria GI: Negative for abdominal pain, diarrhea, bleeding, constipation Skin: negative for rash and pruritus Heme: negative for easy bruising and gum/nose bleeding MS: as above Neurolo:negative for headaches, dizziness, vertigo, memory problems  Psych: negative for feelings of anxiety, depression  Endocrine: negative for thyroid , diabetes Allergy/Immunology- sulfa , cipro Objective:  VITALS:  BP (!) 154/77 (BP Location: Left Arm)   Pulse 75   Temp 98.2 F (36.8 C)   Resp 17   Ht 5' 4 (1.626 m)   Wt  72.6 kg   SpO2 97%   BMI 27.46 kg/m   PHYSICAL EXAM:  General: Alert, cooperative, no distress, appears stated age.  Head: Normocephalic, without obvious abnormality, atraumatic. Eyes: Conjunctivae clear, anicteric sclerae. Pupils are equal ENT Nares normal. No drainage or sinus tenderness. Lips, mucosa, and tongue normal. No Thrush Neck: Supple, symmetrical, no adenopathy, thyroid : non tender no carotid bruit and no JVD. Back: No CVA tenderness. Lungs: Clear to auscultation bilaterally. No Wheezing or Rhonchi. No rales. Heart: Regular rate and rhythm, no murmur, rub or gallop. Abdomen: Soft, non-tender,not distended. Bowel sounds normal. No masses Extremities: left knee wound vac Some swelling Skin: No rashes or lesions. Or bruising Lymph: Cervical, supraclavicular normal. Neurologic: Grossly non-focal Pertinent Labs Lab Results CBC    Component Value Date/Time   WBC 4.9 04/30/2024 0613   RBC 2.62 (L) 04/30/2024 0613   HGB 8.3 (L) 04/30/2024 0613   HGB 11.0 (L) 02/08/2024 1258   HGB 11.0 (L) 08/23/2013 0524   HCT 25.9 (L) 04/30/2024 0613   HCT 32.6 (L) 08/23/2013 0524   PLT 268 04/30/2024 0613   PLT 211 02/08/2024 1258   PLT 212 08/23/2013 0524   MCV 98.9 04/30/2024 0613   MCV 89 08/23/2013 0524   MCH 31.7 04/30/2024 0613   MCHC 32.0 04/30/2024 0613   RDW 14.0 04/30/2024 0613   RDW 14.3 08/23/2013 0524    LYMPHSABS 1.7 02/08/2024 1258   LYMPHSABS 2.8 08/23/2013 0524   MONOABS 0.6 02/08/2024 1258   MONOABS 0.9 08/23/2013 0524   EOSABS 0.1 02/08/2024 1258   EOSABS 0.1 08/23/2013 0524   BASOSABS 0.0 02/08/2024 1258   BASOSABS 0.1 08/23/2013 0524       Latest Ref Rng & Units 04/30/2024    6:13 AM 04/29/2024    5:22 AM 04/28/2024    4:06 AM  CMP  Glucose 70 - 99 mg/dL  74  85   BUN 8 - 23 mg/dL  19  24   Creatinine 9.55 - 1.00 mg/dL 8.85  8.89  8.87   Sodium 135 - 145 mmol/L  137  136   Potassium 3.5 - 5.1 mmol/L  4.2  4.4   Chloride 98 - 111 mmol/L  103  102   CO2 22 - 32 mmol/L  25  22   Calcium  8.9 - 10.3 mg/dL  9.1  9.5       Microbiology: Recent Results (from the past 240 hours)  Aerobic/Anaerobic Culture w Gram Stain (surgical/deep wound)     Status: None (Preliminary result)   Collection Time: 04/27/24  6:29 PM   Specimen: KNEE  Result Value Ref Range Status   Specimen Description   Final    KNEE Performed at [facility], [address], [place], [place] [address]    Special Requests   Final    NONE Performed at [facility], [address] [place], [place] [address]    Gram Stain   Final    RARE WBC SEEN RARE GRAM POSITIVE COCCI Performed at [facility], [address] [place], [place] [address]    Culture   Final    MODERATE STAPHYLOCOCCUS AUREUS NO ANAEROBES ISOLATED; CULTURE IN PROGRESS FOR 5 DAYS    Report Status PENDING  Incomplete   Organism ID, Bacteria STAPHYLOCOCCUS AUREUS  Final      Susceptibility   Staphylococcus aureus - MIC*    CIPROFLOXACIN <=0.5 SENSITIVE Sensitive     ERYTHROMYCIN <=0.25 SENSITIVE Sensitive  GENTAMICIN <=0.5 SENSITIVE Sensitive     OXACILLIN 0.5 SENSITIVE Sensitive     TETRACYCLINE <=1 SENSITIVE Sensitive     VANCOMYCIN  <=0.5 SENSITIVE Sensitive     TRIMETH/SULFA <=10 SENSITIVE Sensitive     CLINDAMYCIN <=0.25 SENSITIVE Sensitive     RIFAMPIN <=0.5 SENSITIVE Sensitive     Inducible  Clindamycin NEGATIVE Sensitive     LINEZOLID 2 SENSITIVE Sensitive     * MODERATE STAPHYLOCOCCUS AUREUS  Aerobic/Anaerobic Culture w Gram Stain (surgical/deep wound)     Status: None (Preliminary result)   Collection Time: 04/28/24 12:07 PM   Specimen: Wound  Result Value Ref Range Status   Specimen Description   Final    WOUND Performed at [facility], [address] [place], [place] [address]    Special Requests   Final    left knee wound Performed at [facility], [address] [place], [place] [address]    Gram Stain NO WBC SEEN NO ORGANISMS SEEN   Final   Culture   Final    MODERATE STAPHYLOCOCCUS AUREUS SUSCEPTIBILITIES PERFORMED ON PREVIOUS CULTURE WITHIN THE LAST 5 DAYS. Performed at [facility], [address] [place], [place] [address]    Report Status PENDING  Incomplete  Aerobic/Anaerobic Culture w Gram Stain (surgical/deep wound)     Status: None (Preliminary result)   Collection Time: 04/28/24 12:19 PM   Specimen: Wound; Tissue  Result Value Ref Range Status   Specimen Description   Final    WOUND Performed at [facility], [address] [place], [place] [address]    Special Requests   Final    left knee wound Performed at [facility], [address] [place], [place] [address]    Gram Stain   Final    RARE WBC PRESENT, PREDOMINANTLY PMN NO ORGANISMS SEEN Performed at [facility], [address] [place], [place] [address]    Culture FEW STAPHYLOCOCCUS AUREUS  Final   Report Status PENDING  Incomplete   Organism ID, Bacteria STAPHYLOCOCCUS AUREUS  Final      Susceptibility   Staphylococcus aureus - MIC*    CIPROFLOXACIN <=0.5 SENSITIVE Sensitive     ERYTHROMYCIN <=0.25 SENSITIVE Sensitive     GENTAMICIN <=0.5 SENSITIVE Sensitive     OXACILLIN 0.5 SENSITIVE Sensitive     TETRACYCLINE <=1 SENSITIVE Sensitive     VANCOMYCIN  <=0.5 SENSITIVE Sensitive     TRIMETH/SULFA <=10 SENSITIVE Sensitive      CLINDAMYCIN <=0.25 SENSITIVE Sensitive     RIFAMPIN <=0.5 SENSITIVE Sensitive     Inducible Clindamycin NEGATIVE Sensitive     LINEZOLID 2 SENSITIVE Sensitive     * FEW STAPHYLOCOCCUS AUREUS  Aerobic/Anaerobic Culture w Gram Stain (surgical/deep wound)     Status: None (Preliminary result)   Collection Time: 04/28/24 12:24 PM   Specimen: Wound; Body Fluid  Result Value Ref Range Status   Specimen Description   Final    WOUND Performed at [facility], [address], [place], [place] [address]    Special Requests   Final    left knee aspirate Performed at [facility], [address] [place], [place] [address]    Gram Stain   Final    RARE WBC PRESENT, PREDOMINANTLY MONONUCLEAR NO ORGANISMS SEEN    Culture   Final    NO GROWTH < 24 HOURS Performed at [facility], [address], [place], [place] [address]    Report Status  PENDING  Incomplete   IMAGING RESULTS: Xray Knee- intact arthroplasty, small effusion I have personally reviewed the films   Patient has: []  acute illness w/systemic sxs  [mod] [x]  illness posing risk to life or function  [high]  I reviewed:  (3+) [x]  primary team note [x]  consultant note(s) []  procedure/op note(s) []  micro result(s)   []  CBC results []  chemistry results []  radiology report(s) []  nursing note(s)  I independently visualized:  (any)   []  cxs/plates in lab [x]  plain film images []  CT images []  PET images   []  path slide(s) []  ECG tracing []  MRI images []  nuclear scan  I discussed: (any) []  micro and/or path w/lab personnel [x]  drug options and/or interactions w/ID pharmD   []  procedure/OR findings w/other MD(s) []  echo and/or imaging w/other MD(s)   [x]  mgm't w/attending(s) involved in case []  setting up home abx w/OPAT team  Mgm't requires: []  prescription drug(s)  [mod] [x]  intensive toxicity monitoring  [high]    ? Impression/Recommendation ?Recent Left TKA Superfiial skin /[place] infection with MSSA Joint  and hardware not involved Currently on Iv vanco and zosyn - change to cefazolin  Iv for now and later can do Po  HTN  CKD  H/o RT TKA H/o left ca breast with partial mastectomy H/o RT shoulder reverse arthroplasty?  This consult involved complex antimicrobial management ? Discussed with patient, orthopedic surgeon and hospitalist      [1]  Allergies Allergen Reactions   Sulfamethoxazole Other (See Comments)    Anxiety and insomnia   Amlodipine Swelling    Legs swelling   Other Itching    Vit C-vit E-copper-zinc-lutein - itching and fatigue (per [facility])   Statins Other (See Comments)    Muscle pain    Trazodone  Other (See Comments)    Pt states she felt unsteady    Ciprofloxacin Anxiety

## 2024-04-30 NOTE — Progress Notes (Signed)
 [facility]  - [place] at [facility]   PATIENT NAME: Allison Hall    MR#:  [id number]  DATE OF BIRTH:  1940-09-06  SUBJECTIVE:      VITALS:  Blood pressure (!) 154/77, pulse 75, temperature 98.2 F (36.8 C), resp. rate 17, height 5' 4 (1.626 m), weight 72.6 kg, SpO2 97%.  PHYSICAL EXAMINATION:   GENERAL:  84 y.o.-year-old patient with no acute distress.  LUNGS: Normal breath sounds bilaterally, no wheezing CARDIOVASCULAR: S1, S2 normal. No murmur   ABDOMEN: Soft, nontender, nondistended. Bowel sounds present.  EXTREMITIES: No  edema b/l.    NEUROLOGIC: nonfocal  patient is alert and awake SKIN: No obvious rash, lesion, or ulcer.   LABORATORY PANEL:  CBC Recent Labs  Lab 04/30/24 0613  WBC 4.9  HGB 8.3*  HCT 25.9*  PLT 268    Chemistries  Recent Labs  Lab 04/27/24 1745 04/28/24 0406 04/29/24 0522 04/30/24 0613  NA 134*   < > 137  --   K 5.1   < > 4.2  --   CL 97*   < > 103  --   CO2 23   < > 25  --   GLUCOSE 95   < > 74  --   BUN 29*   < > 19  --   CREATININE 1.31*   < > 1.10* 1.14*  CALCIUM  9.7   < > 9.1  --   AST 16  --   --   --   ALT 12  --   --   --   ALKPHOS 116  --   --   --   BILITOT 0.3  --   --   --    < > = values in this interval not displayed.    Assessment and Plan  Allison Hall is a 84 y.o. female  with medical history significant for Breast cancer s/p XRT, hypertension and osteoarthritis, recent left total knee replacement 03/25/2024 on apixaban  prophylaxis, who presented to the hospital because of redness and purulent drainage from the surgical wound on the left knee.  admitted to the hospital for postop wound left knee wound infection.   S/p left TKA on 03/25/2024, postop left knee wound infection:  --S/p irrigation and debridement of left knee wound infection on 04/28/2024 by Dr Edie --Follow-up surgical wound cultures staph aureus --left knee joint fluid aspirate--no growth --Continue IV  vancomycin  and Zosyn .  Analgesics as needed for pain. --ID consult for help with abxs --Plan for repeat washout on Thursday, 05/01/2024.   --hold eliquis  for procedure on 05/01/24 --PT recommended home health therapy.  Home health orders for PT, OT and RN have been placed.    AKI: Improved   Acute on chronic anemia: Hemoglobin down from 9.6-8.6-8.1. --No indication for transfusion at this time.  Monitor H&H and transfuse as needed.   Hypertension: Continue antihypertensives (carvedilol , lisinopril  and spironolactone )   History of right breast cancer    Procedures: I and D of left knees infection Family communication :none at bedside Consults : orthopedic, ID CODE STATUS: full DVT Prophylaxis : eliquis  on hold for procedure Level of care: Med-Surg Status is: Inpatient Remains inpatient appropriate because: left knee infection--has wash out procedure tomorrow    TOTAL TIME TAKING CARE OF THIS PATIENT: 35 minutes.  >50% time spent on counselling and coordination of care  Note: This dictation was prepared with Dragon dictation along with smaller phrase technology. Any transcriptional errors that result from  this process are unintentional.  Leita Blanch M.D    [facility]   CC: Primary care physician; Claudene Arthea Sharper, MD

## 2024-04-30 NOTE — Progress Notes (Addendum)
 " Subjective: 2 Days Post-Op  Irrigation and debridement of superficial wound infection left knee.  Patient reports pain as mild.   Patient is well, and has had no acute complaints or problems Plan is to go Home after hospital stay. Negative for chest pain and shortness of breath Fever: no Gastrointestinal:Negative for nausea and vomiting Reports she is passing some gas this AM.  Objective: Vital signs in last 24 hours: Temp:  [97.8 F (36.6 C)-98.2 F (36.8 C)] 98.2 F (36.8 C) (02/04 0721) Pulse Rate:  [71-84] 75 (02/04 0721) Resp:  [16-17] 17 (02/04 0721) BP: (118-156)/(71-85) 154/77 (02/04 0721) SpO2:  [96 %-100 %] 97 % (02/04 0721)  Intake/Output from previous day:  Intake/Output Summary (Last 24 hours) at 04/30/2024 0803 Last data filed at 04/29/2024 1921 Gross per 24 hour  Intake 990.78 ml  Output --  Net 990.78 ml    Intake/Output this shift: No intake/output data recorded.  Labs: Recent Labs    04/27/24 1745 04/28/24 0406 04/29/24 0522 04/30/24 0613  HGB 9.6* 8.6* 8.1* 8.3*   Recent Labs    04/29/24 0522 04/30/24 0613  WBC 5.2 4.9  RBC 2.54* 2.62*  HCT 25.4* 25.9*  PLT 259 268   Recent Labs    04/28/24 0406 04/29/24 0522 04/30/24 0613  NA 136 137  --   K 4.4 4.2  --   CL 102 103  --   CO2 22 25  --   BUN 24* 19  --   CREATININE 1.12* 1.10* 1.14*  GLUCOSE 85 74  --   CALCIUM  9.5 9.1  --    No results for input(s): LABPT, INR in the last 72 hours.   EXAM General - Patient is Alert, Appropriate, and Oriented Extremity - Woundvac intact to the left knee.  Mild bloody drainage noted. Erythema over the anterior aspect of the tibia is much improved compared to prior to surgery but still present today.  Minimal pain with palpation over the area. No effusion noted. Extension and flexion intact without pain today. Negative homans test. Intact to light touch to the left leg.  Dorsiflexion and plantarflexion intact.   Past Medical History:   Diagnosis Date   Adrenal nodule    Anemia    Anxiety    Breast cancer of upper-outer quadrant of right female breast (HCC) 08/24/2016   Central stenosis of spinal canal    Chronic kidney disease, stage 3a (HCC)    Chronic pain of left knee    Colon polyp    COVID-19 02/14/2020   received treatment   Depression    GERD (gastroesophageal reflux disease)    Hypercholesteremia    Hypertension    Macular degeneration    Osteoarthritis    Osteoarthritis of left knee    Osteoporosis    Personal history of radiation therapy 2018   RIGHT BREAST CA UOQ   Primary osteoarthritis of left knee    Primary osteoarthritis of right shoulder    Scoliosis of lumbar spine    Skin cancer    Thyroid  nodule 04/17/2016    Assessment/Plan: 2 Days Post-Op  Irrigation and debridement of superficial wound infection left knee.  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   Weight as of this encounter: 72.6 kg. Advance diet Up with therapy  Labs and vitals reviewed, WBC 4.9, no recent fevers. Culture from knee joint without growth, WBC 114 from synovial fluid.  No signs of infection involving the knee. Culture from wound demonstrated staph aureus.  Currenly on Zosyn  and Vanc. ID consult placed today to assist with antibiotic coverage at discharge. Up with therapy today.  Can walk around the room as tolerated. Plan for return to the OR tomorrow for repeat washout and possible skin closure. Will make NPO after midnight.  Hold Eliquis  dose today.  DVT Prophylaxis - Eliquis  Weight-Bearing as tolerated to left leg  J. Gustavo Level, PA-C [facility] Orthopaedic Surgery 04/30/2024, 8:03 AM  "

## 2024-05-01 ENCOUNTER — Encounter: Admission: EM | Disposition: A | Payer: Self-pay | Source: Home / Self Care | Attending: Internal Medicine

## 2024-05-01 ENCOUNTER — Inpatient Hospital Stay

## 2024-05-01 MED ORDER — GLYCOPYRROLATE 0.2 MG/ML IJ SOLN
INTRAMUSCULAR | Status: DC | PRN
  Administered 2024-05-01: .2 mg via INTRAVENOUS

## 2024-05-01 MED ORDER — CHLORHEXIDINE GLUCONATE 0.12 % MT SOLN
15.0000 mL | Freq: Once | OROMUCOSAL | Status: AC
  Administered 2024-05-01: 15 mL via OROMUCOSAL

## 2024-05-01 MED ORDER — DEXAMETHASONE SOD PHOSPHATE PF 10 MG/ML IJ SOLN
INTRAMUSCULAR | Status: DC | PRN
  Administered 2024-05-01: 10 mg via INTRAVENOUS

## 2024-05-01 MED ORDER — METOCLOPRAMIDE HCL 5 MG PO TABS: 5.0000 mg | ORAL_TABLET | Freq: Three times a day (TID) | ORAL | Status: DC | PRN

## 2024-05-01 MED ORDER — ACETAMINOPHEN 10 MG/ML IV SOLN
INTRAVENOUS | Status: AC
  Filled 2024-05-01: qty 100

## 2024-05-01 MED ORDER — PROPOFOL 10 MG/ML IV BOLUS
INTRAVENOUS | Status: DC | PRN
  Administered 2024-05-01: 50 mg via INTRAVENOUS
  Administered 2024-05-01: 10 mg via INTRAVENOUS

## 2024-05-01 MED ORDER — LACTATED RINGERS IV SOLN: INTRAVENOUS | Status: DC | PRN

## 2024-05-01 MED ORDER — BISACODYL 10 MG RE SUPP: 10.0000 mg | Freq: Every day | RECTAL | Status: DC | PRN

## 2024-05-01 MED ORDER — FLEET ENEMA RE ENEM: 1.0000 | ENEMA | Freq: Once | RECTAL | Status: DC | PRN

## 2024-05-01 MED ORDER — ACETAMINOPHEN 500 MG PO TABS
1000.0000 mg | ORAL_TABLET | Freq: Four times a day (QID) | ORAL | Status: DC
  Administered 2024-05-01 – 2024-05-02 (×3): 1000 mg via ORAL
  Filled 2024-05-01 (×3): qty 2

## 2024-05-01 MED ORDER — ORAL CARE MOUTH RINSE: 15.0000 mL | Freq: Once | OROMUCOSAL | Status: DC

## 2024-05-01 MED ORDER — FENTANYL CITRATE (PF) 100 MCG/2ML IJ SOLN
INTRAMUSCULAR | Status: AC
  Filled 2024-05-01: qty 2

## 2024-05-01 MED ORDER — ACETAMINOPHEN 325 MG PO TABS: 325.0000 mg | ORAL_TABLET | Freq: Four times a day (QID) | ORAL | Status: DC | PRN

## 2024-05-01 MED ORDER — ONDANSETRON HCL 4 MG/2ML IJ SOLN: 4.0000 mg | Freq: Four times a day (QID) | INTRAMUSCULAR | Status: DC | PRN

## 2024-05-01 MED ORDER — BUPIVACAINE-EPINEPHRINE (PF) 0.5% -1:200000 IJ SOLN
INTRAMUSCULAR | Status: AC
  Filled 2024-05-01: qty 10

## 2024-05-01 MED ORDER — BUPIVACAINE LIPOSOME 1.3 % IJ SUSP
INTRAMUSCULAR | Status: AC
  Filled 2024-05-01: qty 10

## 2024-05-01 MED ORDER — METOCLOPRAMIDE HCL 5 MG/ML IJ SOLN: 5.0000 mg | Freq: Three times a day (TID) | INTRAMUSCULAR | Status: DC | PRN

## 2024-05-01 MED ORDER — DIPHENHYDRAMINE HCL 12.5 MG/5ML PO ELIX: 12.5000 mg | ORAL_SOLUTION | ORAL | Status: DC | PRN

## 2024-05-01 MED ORDER — ONDANSETRON HCL 4 MG/2ML IJ SOLN
INTRAMUSCULAR | Status: DC | PRN
  Administered 2024-05-01: 4 mg via INTRAVENOUS

## 2024-05-01 MED ORDER — FENTANYL CITRATE (PF) 100 MCG/2ML IJ SOLN
INTRAMUSCULAR | Status: DC | PRN
  Administered 2024-05-01: 50 ug via INTRAVENOUS
  Administered 2024-05-01: 25 ug via INTRAVENOUS
  Administered 2024-05-01: 50 ug via INTRAVENOUS

## 2024-05-01 MED ORDER — ONDANSETRON HCL 4 MG PO TABS: 4.0000 mg | ORAL_TABLET | Freq: Four times a day (QID) | ORAL | Status: DC | PRN

## 2024-05-01 MED ORDER — LACTATED RINGERS IV SOLN: INTRAVENOUS | Status: DC

## 2024-05-01 MED ORDER — ACETAMINOPHEN 10 MG/ML IV SOLN
INTRAVENOUS | Status: DC | PRN
  Administered 2024-05-01: 1000 mg via INTRAVENOUS

## 2024-05-01 MED ORDER — PROPOFOL 1000 MG/100ML IV EMUL
INTRAVENOUS | Status: AC
  Filled 2024-05-01: qty 200

## 2024-05-01 MED ORDER — PROPOFOL 500 MG/50ML IV EMUL
INTRAVENOUS | Status: DC | PRN
  Administered 2024-05-01: 165 ug/kg/min via INTRAVENOUS

## 2024-05-01 MED ORDER — 0.9 % SODIUM CHLORIDE (POUR BTL) OPTIME
TOPICAL | Status: DC | PRN
  Administered 2024-05-01: 1000 mL

## 2024-05-01 MED ORDER — KETAMINE HCL 50 MG/5ML IJ SOSY
PREFILLED_SYRINGE | INTRAMUSCULAR | Status: AC
  Filled 2024-05-01: qty 5

## 2024-05-01 MED ORDER — BUPIVACAINE-EPINEPHRINE 0.5% -1:200000 IJ SOLN
INTRAMUSCULAR | Status: DC | PRN
  Administered 2024-05-01: 10 mL via INTRAMUSCULAR

## 2024-05-01 MED ORDER — SODIUM CHLORIDE 0.9 % IV SOLN: INTRAVENOUS | Status: DC

## 2024-05-01 MED ORDER — DEXMEDETOMIDINE HCL IN NACL 200 MCG/50ML IV SOLN
INTRAVENOUS | Status: DC | PRN
  Administered 2024-05-01: 8 ug via INTRAVENOUS
  Administered 2024-05-01: 12 ug via INTRAVENOUS

## 2024-05-01 MED ORDER — KETAMINE HCL 10 MG/ML IJ SOLN
INTRAMUSCULAR | Status: DC | PRN
  Administered 2024-05-01: 5 mg via INTRAVENOUS

## 2024-05-01 MED ORDER — SENNA 8.6 MG PO TABS
1.0000 | ORAL_TABLET | Freq: Two times a day (BID) | ORAL | Status: DC
  Filled 2024-05-01 (×2): qty 1

## 2024-05-01 MED ORDER — CHLORHEXIDINE GLUCONATE 0.12 % MT SOLN: 15.0000 mL | Freq: Once | OROMUCOSAL | Status: DC

## 2024-05-01 NOTE — Anesthesia Preprocedure Evaluation (Signed)
"                                    Anesthesia Evaluation  Patient identified by MRN, date of birth, ID band Patient awake    Reviewed: Allergy & Precautions, H&P , NPO status , Patient's Chart, lab work & pertinent test results, reviewed documented beta blocker date and time   Airway Mallampati: II  TM Distance: >3 FB Neck ROM: Full    Dental no notable dental hx.    Pulmonary neg pulmonary ROS, former smoker   Pulmonary exam normal breath sounds clear to auscultation       Cardiovascular Exercise Tolerance: Good hypertension, negative cardio ROS Normal cardiovascular exam Rhythm:Regular Rate:Normal     Neuro/Psych negative neurological ROS  negative psych ROS   GI/Hepatic negative GI ROS, Neg liver ROS,,,  Endo/Other  negative endocrine ROS    Renal/GU negative Renal ROS  negative genitourinary   Musculoskeletal negative musculoskeletal ROS (+)    Abdominal   Peds negative pediatric ROS (+)  Hematology negative hematology ROS (+)   Anesthesia Other Findings   Reproductive/Obstetrics negative OB ROS                              Anesthesia Physical Anesthesia Plan  ASA: 3  Anesthesia Plan: MAC   Post-op Pain Management: Ofirmev  IV (intra-op)*   Induction: Intravenous  PONV Risk Score and Plan: 1 and Ondansetron   Airway Management Planned: Simple Face Mask  Additional Equipment:   Intra-op Plan:   Post-operative Plan: Extubation in OR  Informed Consent: I have reviewed the patients History and Physical, chart, labs and discussed the procedure including the risks, benefits and alternatives for the proposed anesthesia with the patient or authorized representative who has indicated his/her understanding and acceptance.     Dental advisory given  Plan Discussed with: CRNA  Anesthesia Plan Comments:         Anesthesia Quick Evaluation  "

## 2024-05-01 NOTE — Plan of Care (Signed)

## 2024-05-01 NOTE — Op Note (Signed)
 05/01/2024  4:32 PM  Patient:   Allison Hall  Pre-Op Diagnosis:   Superficial wound infection status post left TKA.  Post-Op Diagnosis:   Same  Procedure:   Repeat irrigation and debridement with delayed primary closure left pretibial wound.  Surgeon:   DOROTHA Reyes Maltos, MD  Assistant:   None  Anesthesia:   IV sedation  Findings:   As above.  The base of the wound looked quite clean and already was granulating in.  The area of erythema noted prior to the initial irrigation debridement was much improved.  There was a small amount of purulent material that could be expressed from the medial aspect of the wound.  Complications:   None  Fluids:   300 cc crystalloid  EBL:   25 cc  UOP:   None  TT:   None  Drains:   Penrose x 1  Closure:   #0 Prolene interrupted sutures  Brief Clinical Note:   The patient is an 84 year old female who is now nearly 6 weeks status post a left total knee arthroplasty.  Approximately 1 week ago, she began to notice some increased pain, swelling, and drainage emanating from the lower end of her surgical incision.  She underwent a formal irrigation and debridement of the superficial wound infection with subcutaneous abscess 3 days ago.  The wound was left open and a wound VAC applied.  The patient presents at this time for a repeat irrigation and debridement with probable delayed primary closure of the pretibial wound.  Procedure:   The patient was brought into the operating room and laid in the supine position.  After adequate IV sedation was achieved, the left lower extremity was prepped with a Betadine prep solution before being draped sterilely.  Perioperative antibiotics were continued.  A timeout was performed to verify the appropriate surgical site.  The wound margins were sharply debrided and a small amount of purulent material was expressed from the medial side of the wound.  A swab culture was obtained before the wound was gently abraded  with a key elevator.   The wound was copiously irrigated with sterile saline solution using bulb irrigation.  The wound was then closed using #0 Prolene interrupted sutures.  A Penrose drain was placed into the medial side of the wound before a sterile bulky dressing was applied to the knee.  The patient was then awakened and returned to the recovery room in satisfactory condition after tolerating the procedure well.

## 2024-05-01 NOTE — Progress Notes (Signed)
 PT Cancellation Note  Patient Details Name: Allison Hall MRN: [id number] DOB: 13-Jan-1941   Cancelled Treatment:     Pt c/o diarrhea from antibiotics and resting prior to L knee debridement and irrigation this pm. Will reassess tomorrow as appropriate.    [name] 05/01/2024, 2:16 PM

## 2024-05-01 NOTE — Transfer of Care (Signed)
 Immediate Anesthesia Transfer of Care Note  Patient: Allison Hall  Procedure(s) Performed: IRRIGATION AND DEBRIDEMENT KNEE (Left: Knee)  Patient Location: PACU  Anesthesia Type:General  Level of Consciousness: drowsy and patient cooperative  Airway & Oxygen Therapy: Patient Spontanous Breathing and Patient connected to face mask oxygen  Post-op Assessment: Report given to RN and Post -op Vital signs reviewed and stable  Post vital signs: Reviewed and stable  Last Vitals:  Vitals Value Taken Time  BP 99/58 05/01/24 16:31  Temp 36.3 C 05/01/24 16:30  Pulse 95 05/01/24 16:35  Resp 16 05/01/24 16:35  SpO2 96 % 05/01/24 16:35  Vitals shown include unfiled device data.  Last Pain:  Vitals:   05/01/24 1630  TempSrc:   PainSc: 0-No pain         Complications: No notable events documented.

## 2024-05-01 NOTE — Progress Notes (Signed)
" ° °  Brief Progress Note   _____________________________________________________________________________________________________________  Patient Name: Allison Hall Patient DOB: March 23, 1941 Date: 05-01-24    Action: Reached out to bedside nurse to ensure patient is ready for surgery.  _____________________________________________________________________________________________________________  [facility] Patient Care Command RN Expeditor Rexene LITTIE Kirks Please contact us  directly via secure chat (search for [facility]) or by calling us  at [phone] [facility]).  "

## 2024-05-01 NOTE — Progress Notes (Signed)
 "  Date of Admission:  04/27/2024    ID: Allison Hall is a 84 y.o. 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)   Anemia   AKI (acute kidney injury)   Infection of superficial incisional surgical site after procedure   MSSA (methicillin susceptible Staphylococcus aureus) infection    Subjective: Pt had further debridement and closing of the surgical site today Doing well Pt is eating Medications:   [MAR Hold] carvedilol   12.5 mg Oral BID WC   [MAR Hold] celecoxib   200 mg Oral BID   [MAR Hold] cholecalciferol   1,000 Units Oral Daily   [MAR Hold] lisinopril   40 mg Oral Daily   [MAR Hold] pantoprazole   40 mg Oral Daily   [MAR Hold] senna  1 tablet Oral Daily   [MAR Hold] spironolactone   25 mg Oral Daily   [MAR Hold] traMADol   50 mg Oral Q6H    Objective: Vital signs in last 24 hours: Patient Vitals for the past 24 hrs:  BP Temp Temp src Pulse Resp SpO2 Height Weight  05/01/24 1428 (!) 154/69 (!) 97.4 F (36.3 C) Temporal 76 17 98 % 5' 4 (1.626 m) 72.6 kg  05/01/24 1119 (!) 177/90 -- -- 82 -- 100 % -- --  05/01/24 0700 (!) 170/79 97.9 F (36.6 C) -- 77 17 99 % -- --  05/01/24 0529 (!) 169/83 97.8 F (36.6 C) -- 75 20 99 % -- --  04/30/24 2011 (!) 125/49 97.6 F (36.4 C) -- 77 18 98 % -- --  04/30/24 1629 (!) 120/54 98.1 F (36.7 C) Oral 68 18 100 % -- --      PHYSICAL EXAM:  General: Alert, cooperative, no distress, appears stated age.  Lungs: Clear to auscultation bilaterally. No Wheezing or Rhonchi. No rales. Heart: Regular rate and rhythm, no murmur, rub or gallop. Extremities: left leg surgical dressing not removed Skin: No rashes or lesions. Or bruising Neurologic: Grossly non-focal  Lab Results    Latest Ref Rng & Units 04/30/2024    6:13 AM 04/29/2024     5:22 AM 04/28/2024    4:06 AM  CBC  WBC 4.0 - 10.5 K/uL 4.9  5.2  6.8   Hemoglobin 12.0 - 15.0 g/dL 8.3  8.1  8.6   Hematocrit 36.0 - 46.0 % 25.9  25.4  25.8   Platelets 150 - 400 K/uL 268  259  255        Latest Ref Rng & Units 04/30/2024    6:13 AM 04/29/2024    5:22 AM 04/28/2024    4:06 AM  CMP  Glucose 70 - 99 mg/dL  74  85   BUN 8 - 23 mg/dL  19  24   Creatinine 9.55 - 1.00 mg/dL 8.85  8.89  8.87   Sodium 135 - 145 mmol/L  137  136   Potassium 3.5 - 5.1 mmol/L  4.2  4.4   Chloride 98 - 111 mmol/L  103  102   CO2 22 - 32 mmol/L  25  22   Calcium  8.9 - 10.3 mg/dL  9.1  9.5       Microbiology: 04/28/24 synovial fluid culture NG 04/28/24 - wound culture MSSA   Assessment/Plan: Recent Left TKA Superfiial skin /Bradner infection with MSSA Joint and hardware not involved Currently  on  cefazolin  Iv  On discharge switch to cefadroxil  500mg  PO BID X 10-14 days  HTN   CKD   H/o RT TKA H/o left ca breast with partial mastectomy H/o RT shoulder reverse arthroplasty?  Discussed the management with the patient and the ID pharmacist  Discussed with ortho  She needs to follow up with ortho as OP I will see her if needed   ID will sign off "

## 2024-05-01 NOTE — Anesthesia Postprocedure Evaluation (Signed)
"   Anesthesia Post Note  Patient: Allison Hall  Procedure(s) Performed: IRRIGATION AND DEBRIDEMENT KNEE (Left: Knee)  Patient location during evaluation: PACU Anesthesia Type: MAC Level of consciousness: awake and alert Pain management: pain level controlled Vital Signs Assessment: post-procedure vital signs reviewed and stable Respiratory status: spontaneous breathing, nonlabored ventilation, respiratory function stable and patient connected to nasal cannula oxygen Cardiovascular status: stable and blood pressure returned to baseline Postop Assessment: no apparent nausea or vomiting Anesthetic complications: no   No notable events documented.   Last Vitals:  Vitals:   05/01/24 1630 05/01/24 1634  BP: (!) 99/58   Pulse: 96 97  Resp: 17 16  Temp: (!) 36.3 C   SpO2: 97% 97%    Last Pain:  Vitals:   05/01/24 1634  TempSrc:   PainSc: 0-No pain                 Redell MARLA Breaker      "

## 2024-05-01 NOTE — Progress Notes (Signed)
 [facility]  - [place] [facility]   PATIENT NAME: Allison Hall    MR#:  [id number]  DATE OF BIRTH:  31-Aug-1940  SUBJECTIVE:   Pt awake resting in bed this AM on rounds. She reports overall feeling okay. Going to OR for washout of her knee again today.  Looks forward to hopefully going home soon.   VITALS:  Blood pressure 120/70, pulse 88, temperature (!) 97.3 F (36.3 C), resp. rate 13, height 5' 4 (1.626 m), weight 72.6 kg, SpO2 97%.  PHYSICAL EXAMINATION:   General exam: awake, alert, no acute distress HEENT: moist mucus membranes, hearing grossly normal  Respiratory system: CTAB, no wheezes, rales or rhonchi, normal respiratory effort. Cardiovascular system: normal S1/S2, RRR Gastrointestinal system: soft, NT, ND, +bowel sounds. Central nervous system: no gross focal neurologic deficits, normal speech Extremities: wound vac to left knee Skin: dry, intact, normal temperature Psychiatry: normal mood, congruent affect   LABORATORY PANEL:  CBC Recent Labs  Lab 04/30/24 0613  WBC 4.9  HGB 8.3*  HCT 25.9*  PLT 268    Chemistries  Recent Labs  Lab 04/27/24 1745 04/28/24 0406 04/29/24 0522 04/30/24 0613  NA 134*   < > 137  --   K 5.1   < > 4.2  --   CL 97*   < > 103  --   CO2 23   < > 25  --   GLUCOSE 95   < > 74  --   BUN 29*   < > 19  --   CREATININE 1.31*   < > 1.10* 1.14*  CALCIUM  9.7   < > 9.1  --   AST 16  --   --   --   ALT 12  --   --   --   ALKPHOS 116  --   --   --   BILITOT 0.3  --   --   --    < > = values in this interval not displayed.    Assessment and Plan  Allison Hall is a 84 y.o. female  with medical history significant for Breast cancer s/p XRT, hypertension and osteoarthritis, recent left total knee replacement 03/25/2024 on apixaban  prophylaxis, who presented to the hospital because of redness and purulent drainage from the surgical wound on the left knee.  admitted to the hospital for postop wound  left knee wound infection.   S/p left TKA on 03/25/2024, postop left knee wound infection:  --S/p irrigation and debridement of left knee wound infection on 04/28/2024 by Dr Edie --Follow-up surgical wound cultures staph aureus --left knee joint fluid aspirate--no growth --Continue IV vancomycin  and Zosyn .  Analgesics as needed for pain. --ID consulted for antibiotic recs --2/5 To OR this afternoon for repeat washout  --hold eliquis  for procedure on 05/01/24 --PT recommended home health therapy.  Home health orders for PT, OT and RN have been placed.    AKI: Improved --Monitor BMP   Acute on chronic anemia: Hemoglobin down from 9.6>>8.6>>8.1>>8.3 now stable --No indication for transfusion at this time.   --Monitor H&H and transfuse as needed.   Hypertension: Continue antihypertensives (carvedilol , lisinopril  and spironolactone )   History of right breast cancer    Procedures: I&D of left knee infection x 2 Family communication :none at bedside Consults : orthopedic, ID CODE STATUS: full DVT Prophylaxis : eliquis  on hold for procedure   Level of care: Med-Surg Status is: Inpatient Remains inpatient appropriate because: left knee infection--has wash  out procedure tomorrow    TOTAL TIME TAKING CARE OF THIS PATIENT: 35 minutes.  >50% time spent on counselling and coordination of care  Note: This dictation was prepared with Dragon dictation along with smaller phrase technology. Any transcriptional errors that result from this process are unintentional.  Burnard DELENA Cunning, DO   [facility]   CC: Primary care physician; Claudene Arthea Sharper, MD

## 2024-05-02 ENCOUNTER — Encounter: Payer: Self-pay | Admitting: Internal Medicine

## 2024-05-02 ENCOUNTER — Encounter: Payer: Self-pay | Admitting: Surgery

## 2024-05-02 ENCOUNTER — Other Ambulatory Visit: Payer: Self-pay

## 2024-05-02 LAB — AEROBIC/ANAEROBIC CULTURE W GRAM STAIN (SURGICAL/DEEP WOUND)
Culture: NO GROWTH
Gram Stain: NONE SEEN

## 2024-05-02 LAB — CBC
HCT: 24.9 % — ABNORMAL LOW (ref 36.0–46.0)
Hemoglobin: 8.6 g/dL — ABNORMAL LOW (ref 12.0–15.0)
MCH: 32.6 pg (ref 26.0–34.0)
MCHC: 34.5 g/dL (ref 30.0–36.0)
MCV: 94.3 fL (ref 80.0–100.0)
Platelets: 277 10*3/uL (ref 150–400)
RBC: 2.64 MIL/uL — ABNORMAL LOW (ref 3.87–5.11)
RDW: 13.7 % (ref 11.5–15.5)
WBC: 5.9 10*3/uL (ref 4.0–10.5)
nRBC: 0 % (ref 0.0–0.2)

## 2024-05-02 MED ORDER — TRAMADOL HCL 50 MG PO TABS
50.0000 mg | ORAL_TABLET | Freq: Four times a day (QID) | ORAL | 0 refills | Status: AC | PRN
  Filled 2024-05-02: qty 30, 8d supply, fill #0

## 2024-05-02 MED ORDER — SENNA 8.6 MG PO TABS
1.0000 | ORAL_TABLET | Freq: Two times a day (BID) | ORAL | 0 refills | Status: AC
  Filled 2024-05-02: qty 60, 30d supply, fill #0

## 2024-05-02 MED ORDER — ALUM & MAG HYDROXIDE-SIMETH 200-200-20 MG/5ML PO SUSP: 30.0000 mL | ORAL | Status: AC | PRN

## 2024-05-02 MED ORDER — ALUM & MAG HYDROXIDE-SIMETH 200-200-20 MG/5ML PO SUSP
30.0000 mL | Freq: Once | ORAL | Status: AC
  Administered 2024-05-02: 30 mL via ORAL
  Filled 2024-05-02: qty 30

## 2024-05-02 MED ORDER — CEFADROXIL 500 MG PO CAPS
500.0000 mg | ORAL_CAPSULE | Freq: Two times a day (BID) | ORAL | 0 refills | Status: AC
  Filled 2024-05-02: qty 18, 9d supply, fill #0

## 2024-05-02 MED ORDER — LOPERAMIDE HCL 2 MG PO CAPS
4.0000 mg | ORAL_CAPSULE | ORAL | 0 refills | Status: AC | PRN
  Filled 2024-05-02: qty 30, 15d supply, fill #0

## 2024-05-02 MED ORDER — PROBIOTIC ACIDOPHILUS PO CAPS
1.0000 | ORAL_CAPSULE | Freq: Every day | ORAL | 0 refills | Status: AC
  Filled 2024-05-02: qty 30, 30d supply, fill #0

## 2024-05-02 NOTE — Progress Notes (Signed)
 " Subjective: 1 Day Post-Op  Repeat irrigation and debridement with delayed primary closure left pretibial wound  Patient reports pain as mild in the left knee this morning. Patient is well, and has had no acute complaints or problems Plan is to go Home after hospital stay. Negative for chest pain and shortness of breath Fever: no Gastrointestinal:Negative for nausea and vomiting She had a BM yesterday, reports she is passing some gas this AM.  Objective: Vital signs in last 24 hours: Temp:  [97.3 F (36.3 C)-98.1 F (36.7 C)] 98 F (36.7 C) (02/06 0722) Pulse Rate:  [59-97] 59 (02/06 0722) Resp:  [13-18] 17 (02/06 0722) BP: (99-177)/(58-90) 174/70 (02/06 0722) SpO2:  [96 %-100 %] 97 % (02/06 0722) Weight:  [72.6 kg] 72.6 kg (02/05 1428)  Intake/Output from previous day:  Intake/Output Summary (Last 24 hours) at 05/02/2024 0743 Last data filed at 05/01/2024 1631 Gross per 24 hour  Intake 400 ml  Output 10 ml  Net 390 ml    Intake/Output this shift: No intake/output data recorded.  Labs: Recent Labs    04/30/24 0613 05/02/24 0427  HGB 8.3* 8.6*   Recent Labs    04/30/24 0613 05/02/24 0427  WBC 4.9 5.9  RBC 2.62* 2.64*  HCT 25.9* 24.9*  PLT 268 277   Recent Labs    04/30/24 0613  CREATININE 1.14*   No results for input(s): LABPT, INR in the last 72 hours.   EXAM General - Patient is Alert, Appropriate, and Oriented Extremity - ACE wrap intact without drainage. Extension and flexion intact without pain today. Negative homans test. Intact to light touch to the left leg.  Dorsiflexion and plantarflexion intact.   Past Medical History:  Diagnosis Date   Adrenal nodule    Anemia    Anxiety    Breast cancer of upper-outer quadrant of right female breast (HCC) 08/24/2016   Central stenosis of spinal canal    Chronic kidney disease, stage 3a (HCC)    Chronic pain of left knee    Colon polyp    COVID-19 02/14/2020   received treatment   Depression     GERD (gastroesophageal reflux disease)    Hypercholesteremia    Hypertension    Macular degeneration    Osteoarthritis    Osteoarthritis of left knee    Osteoporosis    Personal history of radiation therapy 2018   RIGHT BREAST CA UOQ   Primary osteoarthritis of left knee    Primary osteoarthritis of right shoulder    Scoliosis of lumbar spine    Skin cancer    Thyroid  nodule 04/17/2016    Assessment/Plan: 1 Day Post-Op  Repeat irrigation and debridement with delayed primary closure left pretibial wound  Principal Problem:   Surgical wound infection Active Problems:   Hypertension   Carcinoma of upper-outer quadrant of right breast in female, estrogen receptor positive (HCC)   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   Weight as of this encounter: 72.6 kg. Advance diet Up with therapy  Labs and vitals reviewed, WBC 5.9, no recent fevers. Culture from knee joint without growth, WBC 114 from synovial fluid.  No signs of infection involving the knee. Culture from  wound demonstrated staph aureus.  Currenly on Ancef , can switch over to oral ABX and plan on discharge today. Primrose drain intact, will return at lunch to remove drain, plan on re-applying ACE wrap, possibly Prevena for discharge. Up with therapy today.  Can walk around the room as tolerated.  DVT Prophylaxis - Eliquis  Weight-Bearing as tolerated to left leg  J. Gustavo Level, PA-C [facility] Orthopaedic Surgery 05/02/2024, 7:43 AM  "

## 2024-05-02 NOTE — Care Management Important Message (Signed)
 Important Message  Patient Details  Name: Allison Hall MRN: [id number] Date of Birth: 12-Jul-1940   Important Message Given:  Yes - Medicare IM     Ruthene Methvin 05/02/2024, 12:35 PM

## 2024-05-02 NOTE — Discharge Summary (Signed)
 " Physician Discharge Summary   Patient: Allison Hall MRN: [id number] DOB: 1941-01-04  Admit date:     04/27/2024  Discharge date: 05/02/2024  Discharge Physician: Burnard DELENA Cunning   PCP: Claudene Arthea Sharper, MD   Recommendations at discharge:    Follow up with [facility] as scheduled, in ~1 week Follow up with PCP in 1-2 weeks Repeat CBC, BMP at follow up Weight-bearing as tolerated on left leg per Ortho  Discharge Diagnoses: Principal Problem:   Surgical wound infection Active Problems:   Hypertension   Carcinoma of upper-outer quadrant of right breast in female, estrogen receptor positive (HCC)   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 *  Hospital Course:  Allison Hall is a 84 y.o. female  with medical history significant for Breast cancer s/p XRT, hypertension and osteoarthritis, recent left total knee replacement 03/25/2024 on apixaban  prophylaxis, who presented to the hospital because of redness and purulent drainage from the surgical wound on the left knee.  04/27/24 - Admitted to the hospital for postop wound left knee wound infection.   Orthopedic surgery was consulted.  Started on IV Vanc/Zosyn .  04/28/24 - surgical I&D with Dr. Edie in OR  04/30/24 - ID consulted. Antibiotics changed to IV Ancef .  05/01/24 - repeat surgical I&D this afternoon with Dr. Edie.  05/02/24 - pt doing well today.  Ortho removed drain and have cleared pt for discharge home today. ID rec for oral cefadroxil  500 mg BID to complete 14 days. Patient worked with PT today and was cleared. Patient is medically stable and requests discharge home today    Assessment and Plan:  S/p left TKA on 03/25/2024, postop left knee wound infection:   --S/p irrigation and debridement of left knee wound infection on 04/28/2024 by Dr Edie --Follow-up surgical wound cultures staph aureus --left knee joint fluid aspirate--no growth --Continue IV vancomycin  and Zosyn .  Analgesics as needed for pain. --ID consulted for antibiotic recs --2/5 To OR this afternoon for repeat washout  --hold eliquis  for procedure on 05/01/24 --PT recommended home health therapy.  Home health orders for PT, OT and RN have been placed.  Discharge antibiotic - cefadroxil  500 mg PO BID x 9 days - for total 14 day course.  --Follow up in ~1 week with Ortho    AKI: Improved --Monitor BMP   Acute on chronic anemia: Hemoglobin down from 9.6>>8.6>>8.1>>8.3 >> 8.6 stable and improving --No indication for transfusion at this time.   --Monitor H&H and transfuse as needed.   Hypertension: Continue antihypertensives (carvedilol , lisinopril  and spironolactone )   History of right breast cancer       Consultants: [facility] Procedures performed: surgical I&D x 2  Disposition: Home Diet recommendation:  Regular diet DISCHARGE MEDICATION: Allergies as of 05/02/2024       Reactions   Sulfamethoxazole Other (See Comments)   Anxiety and insomnia   Amlodipine Swelling   Legs swelling   Other Itching   Vit C-vit E-copper-zinc-lutein - itching and fatigue (per [facility])   Statins Other (See Comments)   Muscle pain    Trazodone  Other (See Comments)   Pt states she felt unsteady    Ciprofloxacin Anxiety        Medication List     STOP taking these  medications    apixaban  2.5 MG Tabs tablet Commonly known as: ELIQUIS    docusate sodium  100 MG capsule Commonly known as: COLACE   ferrous sulfate 324 MG Tbec   ondansetron  4 MG tablet Commonly known as: ZOFRAN    oxyCODONE  5 MG immediate release tablet Commonly known as: Oxy IR/ROXICODONE        TAKE these medications    acetaminophen  650 MG CR tablet Commonly known as: TYLENOL  Take 1,300 mg by mouth  every 8 (eight) hours as needed for pain.   Acidophilus Caps capsule Take 1 capsule by mouth daily at 8 pm.   alum & mag hydroxide-simeth 200-200-20 MG/5ML suspension Commonly known as: MAALOX/MYLANTA Take 30 mLs by mouth every 4 (four) hours as needed for indigestion.   aspirin  325 MG tablet Take 325 mg by mouth daily.   BIOTIN PO Take 1 each by mouth daily. Chew 1 gummy every morning.   carvedilol  12.5 MG tablet Commonly known as: COREG  Take 12.5 mg by mouth 2 (two) times daily with a meal.   cefadroxil  500 MG capsule Commonly known as: DURICEF Take 1 capsule (500 mg total) by mouth 2 (two) times daily for 9 days.   celecoxib  200 MG capsule Commonly known as: CELEBREX  Take 200 mg by mouth 2 (two) times daily.   cyanocobalamin  1000 MCG tablet Commonly known as: VITAMIN B12 Take 1,000 mcg by mouth daily.   Fish Oil 1200 MG Caps Take 1,200 mg by mouth daily.   HAIR SKIN AND NAILS FORMULA PO Take 2 tablets by mouth daily. What changed: Another medication with the same name was removed. Continue taking this medication, and follow the directions you see here.   lisinopril  40 MG tablet Commonly known as: ZESTRIL  Take 40 mg by mouth daily.   loperamide  2 MG capsule Commonly known as: IMODIUM  Take 2 capsules (4 mg total) by mouth as needed for diarrhea or loose stools.   Magnesium  Gluconate 27.5 MG Tabs Take 27.5 mg by mouth at bedtime.   omeprazole 40 MG capsule Commonly known as: PRILOSEC Take 40 mg by mouth daily.   senna 8.6 MG Tabs tablet Commonly known as: SENOKOT Take 1 tablet (8.6 mg total) by mouth 2 (two) times daily. Hold if having loose or frequent stools   spironolactone  25 MG tablet Commonly known as: ALDACTONE  Take 25 mg by mouth daily.   traMADol  50 MG tablet Commonly known as: ULTRAM  Take 1 tablet (50 mg total) by mouth every 6 (six) hours as needed for moderate pain (pain score 4-6) (Breakthrough pain).   Vitamin D3 25 MCG (1000 UT)  Caps Take 1,000 Units by mouth daily.               Discharge Care Instructions  (From admission, onward)           Start     Ordered   05/02/24 0000  Leave dressing on - Keep it clean, dry, and intact until clinic visit        05/02/24 1021            Contact information for after-discharge care     Home Medical Care     [facility] .   Service: Home Health Services Contact information: [address] [place]  [address] [phone]                    Discharge Exam: Allison Hall   04/27/24 1627 05/01/24 1428  Weight: 72.6 kg 72.6 kg   General exam: awake, alert, no acute distress HEENT:moist mucus membranes, hearing grossly normal  Respiratory system: CTAB, no wheezes, rales or rhonchi, normal respiratory effort. Cardiovascular system: normal S1/S2, RRR, no JVD, murmurs, rubs, gallops, no pedal edema.   Gastrointestinal system: soft, NT, ND, no HSM felt, +bowel sounds. Central nervous system: A&O x4. no gross focal neurologic deficits, normal speech Extremities: left dress dressing in place clean and dry, no edema, normal tone Skin: dry, intact, normal temperature Psychiatry: normal mood, congruent affect, judgement and insight appear normal   Condition at discharge: stable  The results of significant diagnostics from this hospitalization (including imaging, microbiology, ancillary and laboratory) are listed below for reference.   Imaging Studies: DG Knee Complete 4 Views Left Result Date: 04/27/2024 EXAM: 4 VIEW(S) XRAY OF THE LEFT KNEE 04/27/2024 06:35:00 PM COMPARISON: 03/25/2024 CLINICAL HISTORY: Infection. FINDINGS: BONES AND JOINTS: Left knee arthroplasty in place. No evidence of loosening. No acute fracture. No malalignment. Small effusion. SOFT TISSUES: Mild soft tissue swelling. VASCULATURE: Femoral popliteal atherosclerosis. IMPRESSION: 1. Intact left knee arthroplasty with small  effusion and mild soft tissue swelling. Electronically signed by: Norman Gatlin MD 04/27/2024 06:44 PM EST RP Workstation: [id number]    Microbiology: Results for orders placed or performed during the hospital encounter of 04/27/24  Aerobic/Anaerobic Culture w Gram Stain (surgical/deep wound)     Status: None   Collection Time: 04/27/24  6:29 PM   Specimen: KNEE  Result Value Ref Range Status   Specimen Description   Final    KNEE Performed at [facility], [address], [place], [place] [address]    Special Requests   Final    NONE Performed at [facility], [address] [place], [place] [address]    Gram Stain RARE WBC SEEN RARE GRAM POSITIVE COCCI   Final   Culture   Final    MODERATE STAPHYLOCOCCUS AUREUS NO ANAEROBES ISOLATED Performed at [facility], [address], [place], [place] [address]    Report Status 05/02/2024 FINAL  Final   Organism ID, Bacteria STAPHYLOCOCCUS AUREUS  Final      Susceptibility   Staphylococcus aureus - MIC*    CIPROFLOXACIN <=0.5 SENSITIVE Sensitive     ERYTHROMYCIN <=0.25 SENSITIVE Sensitive     GENTAMICIN <=0.5 SENSITIVE Sensitive     OXACILLIN 0.5 SENSITIVE Sensitive     TETRACYCLINE <=1 SENSITIVE Sensitive     VANCOMYCIN  <=0.5 SENSITIVE Sensitive     TRIMETH/SULFA <=10 SENSITIVE Sensitive     CLINDAMYCIN <=0.25 SENSITIVE Sensitive     RIFAMPIN <=0.5 SENSITIVE Sensitive     Inducible Clindamycin NEGATIVE Sensitive     LINEZOLID 2 SENSITIVE Sensitive     * MODERATE STAPHYLOCOCCUS AUREUS  Aerobic/Anaerobic Culture w Gram Stain (surgical/deep wound)     Status: None (Preliminary result)   Collection Time: 04/28/24 12:07 PM   Specimen: Wound  Result Value Ref Range Status   Specimen Description   Final    WOUND Performed at [facility], [address], [place], [place] [address]    Special Requests   Final    left knee wound Performed at [facility], [address]  [place], [place] [address]    Gram Stain   Final    NO WBC SEEN NO ORGANISMS SEEN Performed at [facility], [address] [place] [address] [place], [place] [address]    Culture   Final    MODERATE STAPHYLOCOCCUS AUREUS SUSCEPTIBILITIES  PERFORMED ON PREVIOUS CULTURE WITHIN THE LAST 5 DAYS. NO ANAEROBES ISOLATED; CULTURE IN PROGRESS FOR 5 DAYS    Report Status PENDING  Incomplete  Aerobic/Anaerobic Culture w Gram Stain (surgical/deep wound)     Status: None (Preliminary result)   Collection Time: 04/28/24 12:19 PM   Specimen: Wound; Tissue  Result Value Ref Range Status   Specimen Description   Final    WOUND Performed at [facility], [address] [place], [place] [address]    Special Requests   Final    left knee wound Performed at [facility], [address] [place], [place] [address]    Gram Stain   Final    RARE WBC PRESENT, PREDOMINANTLY PMN NO ORGANISMS SEEN Performed at [facility], [address] [place], [place] [address]    Culture   Final    FEW STAPHYLOCOCCUS AUREUS NO ANAEROBES ISOLATED; CULTURE IN PROGRESS FOR 5 DAYS    Report Status PENDING  Incomplete   Organism ID, Bacteria STAPHYLOCOCCUS AUREUS  Final      Susceptibility   Staphylococcus aureus - MIC*    CIPROFLOXACIN <=0.5 SENSITIVE Sensitive     ERYTHROMYCIN <=0.25 SENSITIVE Sensitive     GENTAMICIN <=0.5 SENSITIVE Sensitive     OXACILLIN 0.5 SENSITIVE Sensitive     TETRACYCLINE <=1 SENSITIVE Sensitive     VANCOMYCIN  <=0.5 SENSITIVE Sensitive     TRIMETH/SULFA <=10 SENSITIVE Sensitive     CLINDAMYCIN <=0.25 SENSITIVE Sensitive     RIFAMPIN <=0.5 SENSITIVE Sensitive     Inducible Clindamycin NEGATIVE Sensitive     LINEZOLID 2 SENSITIVE Sensitive     * FEW STAPHYLOCOCCUS AUREUS  Aerobic/Anaerobic Culture w Gram Stain (surgical/deep wound)     Status: None (Preliminary result)   Collection Time: 04/28/24 12:24 PM   Specimen: Wound; Body Fluid  Result Value Ref Range Status    Specimen Description   Final    WOUND Performed at [facility], [address], [place], [place] [address]    Special Requests   Final    left knee aspirate Performed at [facility], [address] [place], [place] [address]    Gram Stain   Final    RARE WBC PRESENT, PREDOMINANTLY MONONUCLEAR NO ORGANISMS SEEN    Culture   Final    NO GROWTH 4 DAYS NO ANAEROBES ISOLATED; CULTURE IN PROGRESS FOR 5 DAYS Performed at [facility], [address], [place], [place] [address]    Report Status PENDING  Incomplete  Aerobic/Anaerobic Culture w Gram Stain (surgical/deep wound)     Status: None (Preliminary result)   Collection Time: 05/01/24  4:09 PM   Specimen: Leg, Left; Wound  Result Value Ref Range Status   Specimen Description   Final    WOUND Performed at [facility], [address] [place], [place] [address]    Special Requests   Final    left leg pretibial Performed at [facility], [address] [place], [place] [address]    Gram Stain NO WBC SEEN NO ORGANISMS SEEN   Final   Culture   Final    RARE STAPHYLOCOCCUS AUREUS CULTURE REINCUBATED FOR BETTER GROWTH Performed at [facility], [address] [place], [place] [address]    Report Status PENDING  Incomplete    Labs: CBC: Recent Labs  Lab 04/27/24 1745 04/28/24 0406 04/29/24 0522 04/30/24 0613 05/02/24 0427  WBC 6.7 6.8 5.2 4.9 5.9  HGB  9.6* 8.6* 8.1* 8.3* 8.6*  HCT 30.1* 25.8* 25.4* 25.9* 24.9*  MCV 100.3* 96.6 100.0 98.9 94.3  PLT 324 255 259 268 277   Basic Metabolic Panel: Recent Labs  Lab 04/27/24 1745 04/28/24 0406 04/29/24 0522 04/30/24 0613  NA 134* 136 137  --   K 5.1 4.4 4.2  --   CL 97* 102 103  --   CO2 23 22 25   --   GLUCOSE 95 85 74  --   BUN 29* 24* 19  --   CREATININE 1.31* 1.12* 1.10* 1.14*  CALCIUM  9.7 9.5 9.1  --    Liver Function Tests: Recent Labs  Lab 04/27/24 1745  AST 16  ALT 12  ALKPHOS 116  BILITOT 0.3   PROT 6.8  ALBUMIN 4.2   CBG: No results for input(s): GLUCAP in the last 168 hours.  Discharge time spent: greater than 30 minutes.  Signed: Burnard DELENA Cunning, DO [facility] 05/02/2024 "

## 2024-05-02 NOTE — Progress Notes (Signed)
 Pt c/o indigestion. This nurse administered PRN medication per MAR. Will continue to monitor.

## 2024-05-02 NOTE — Evaluation (Signed)
 Physical Therapy Evaluation Patient Details Name: Allison Hall MRN: [id number] DOB: 22-Aug-1940 Today's Date: 05/02/2024  History of Present Illness  84 y/o female presented to ED on 04/27/24 for redness around surgical area with purulent drainage. 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 Found to have wound infection. S/p I&D superficial wound infection L knee. PMH: HTN, depression, CKD stage 3a, hx of breast cancer, iron  deficiency anemia, anxiety. Went for I&D Left knee 05/01/24.  Clinical Impression  Pt continues to move quite well, consistent with prior to I&D, household distances with RW, no LOB, good safety awareness. Pt has no pain during my visit, gait patterns negative for antalgia. Pt has met all mobility benchmarks for safe DC to home. No DME needs. HHPT recs made which are already in place per TOC.       If plan is discharge home, recommend the following: A little help with bathing/dressing/bathroom;Assistance with cooking/housework;Assist for transportation;Help with stairs or ramp for entrance   Can travel by private vehicle        Equipment Recommendations None recommended by PT  Recommendations for Other Services       Functional Status Assessment Patient has had a recent decline in their functional status and demonstrates the ability to make significant improvements in function in a reasonable and predictable amount of time.     Precautions / Restrictions Precautions Precautions: Fall Restrictions LLE Weight Bearing Per Provider Order: Weight bearing as tolerated      Mobility  Bed Mobility                    Transfers   Equipment used: Rolling walker (2 wheels) Transfers: Sit to/from Stand Sit to Stand: Modified independent (Device/Increase time)                Ambulation/Gait Ambulation/Gait assistance: Supervision Gait Distance (Feet): 350 Feet Assistive device: Rolling walker (2 wheels) Gait Pattern/deviations: Step-through pattern, Decreased  stride length, Knee flexed in stance - left Gait velocity: increased        Stairs            Wheelchair Mobility     Tilt Bed    Modified Rankin (Stroke Patients Only)       Balance Overall balance assessment: Modified Independent                                           Pertinent Vitals/Pain Pain Assessment Pain Assessment: No/denies pain    Home Living Family/patient expects to be discharged to:: Private residence Living Arrangements: Children Available Help at Discharge: Family;Available PRN/intermittently Type of Home: House Home Access: Stairs to enter Entrance Stairs-Rails: Doctor, General Practice of Steps: 5   Home Layout: Multi-level Home Equipment: Agricultural Consultant (2 wheels);Cane - single point;Shower seat      Prior Function Prior Level of Function : Independent/Modified Independent             Mobility Comments: has been utilizing SPC since TKA in 02/2024. Has been receiving HHPT ADLs Comments: Indep     Extremity/Trunk Assessment                Communication        Cognition Arousal: Alert Behavior During Therapy: WFL for tasks assessed/performed   PT - Cognitive impairments: No apparent impairments  Cueing       General Comments      Exercises     Assessment/Plan    PT Assessment Patient needs continued PT services  PT Problem List Decreased strength;Decreased range of motion;Decreased activity tolerance;Decreased balance;Decreased mobility;Pain       PT Treatment Interventions DME instruction;Gait training;Stair training;Therapeutic exercise;Therapeutic activities;Functional mobility training;Balance training;Neuromuscular re-education;Patient/family education    PT Goals (Current goals can be found in the Care Plan section)  Acute Rehab PT Goals Patient Stated Goal: to go home after procedures are done PT Goal Formulation: With  patient Time For Goal Achievement: 05/13/24 Potential to Achieve Goals: Good    Frequency Min 3X/week     Co-evaluation               AM-PAC PT 6 Clicks Mobility  Outcome Measure Help needed turning from your back to your side while in a flat bed without using bedrails?: None Help needed moving from lying on your back to sitting on the side of a flat bed without using bedrails?: None Help needed moving to and from a bed to a chair (including a wheelchair)?: A Little Help needed standing up from a chair using your arms (e.g., wheelchair or bedside chair)?: A Little Help needed to walk in hospital room?: A Little Help needed climbing 3-5 steps with a railing? : A Little 6 Click Score: 20    End of Session Equipment Utilized During Treatment: Gait belt Activity Tolerance: Patient tolerated treatment well Patient left: with call bell/phone within reach;in bed Nurse Communication: Mobility status PT Visit Diagnosis: Unsteadiness on feet (R26.81);Muscle weakness (generalized) (M62.81);Difficulty in walking, not elsewhere classified (R26.2)    Time: 9064-9041 PT Time Calculation (min) (ACUTE ONLY): 23 min   Charges:   PT Evaluation $PT Re-evaluation: 1 Re-eval PT Treatments $Therapeutic Exercise: 8-22 mins PT General Charges $$ ACUTE PT VISIT: 1 Visit       10:14 AM, 05/02/24 Peggye JAYSON Linear, PT, DPT Physical Therapist - [facility]  [phone] (ASCOM)    Joanie Duprey C 05/02/2024, 10:13 AM

## 2024-05-02 NOTE — Plan of Care (Signed)

## 2024-05-09 ENCOUNTER — Inpatient Hospital Stay: Admitting: Internal Medicine

## 2024-05-09 ENCOUNTER — Inpatient Hospital Stay

## 2024-05-12 ENCOUNTER — Ambulatory Visit: Admit: 2024-05-12 | Admitting: Gastroenterology

## 2024-05-21 ENCOUNTER — Ambulatory Visit

## 2024-05-27 ENCOUNTER — Ambulatory Visit
